# Patient Record
Sex: Male | Born: 1935 | Race: White | Hispanic: No | Smoking: Former smoker
Health system: Southern US, Community
[De-identification: ages and names within clinical notes are randomized; demographics above are authoritative.]

## PROBLEM LIST (undated history)

## (undated) DIAGNOSIS — F0390 Unspecified dementia without behavioral disturbance: Secondary | ICD-10-CM

## (undated) DIAGNOSIS — M1990 Unspecified osteoarthritis, unspecified site: Secondary | ICD-10-CM

## (undated) DIAGNOSIS — I209 Angina pectoris, unspecified: Secondary | ICD-10-CM

## (undated) DIAGNOSIS — M810 Age-related osteoporosis without current pathological fracture: Secondary | ICD-10-CM

## (undated) DIAGNOSIS — K219 Gastro-esophageal reflux disease without esophagitis: Secondary | ICD-10-CM

## (undated) DIAGNOSIS — E7800 Pure hypercholesterolemia, unspecified: Secondary | ICD-10-CM

## (undated) DIAGNOSIS — I2510 Atherosclerotic heart disease of native coronary artery without angina pectoris: Secondary | ICD-10-CM

## (undated) DIAGNOSIS — G629 Polyneuropathy, unspecified: Secondary | ICD-10-CM

## (undated) DIAGNOSIS — F329 Major depressive disorder, single episode, unspecified: Secondary | ICD-10-CM

## (undated) DIAGNOSIS — I10 Essential (primary) hypertension: Secondary | ICD-10-CM

## (undated) DIAGNOSIS — E0590 Thyrotoxicosis, unspecified without thyrotoxic crisis or storm: Secondary | ICD-10-CM

## (undated) DIAGNOSIS — C801 Malignant (primary) neoplasm, unspecified: Secondary | ICD-10-CM

## (undated) DIAGNOSIS — F32A Depression, unspecified: Secondary | ICD-10-CM

## (undated) DIAGNOSIS — I509 Heart failure, unspecified: Secondary | ICD-10-CM

## (undated) HISTORY — PX: BACK SURGERY: SHX140

## (undated) HISTORY — DX: Heart failure, unspecified: I50.9

## (undated) HISTORY — DX: Unspecified osteoarthritis, unspecified site: M19.90

## (undated) HISTORY — DX: Age-related osteoporosis without current pathological fracture: M81.0

## (undated) HISTORY — DX: Gastro-esophageal reflux disease without esophagitis: K21.9

## (undated) HISTORY — DX: Essential (primary) hypertension: I10

## (undated) HISTORY — DX: Unspecified dementia, unspecified severity, without behavioral disturbance, psychotic disturbance, mood disturbance, and anxiety: F03.90

---

## 1998-10-18 ENCOUNTER — Encounter: Payer: Self-pay | Admitting: Emergency Medicine

## 1998-10-18 ENCOUNTER — Emergency Department (HOSPITAL_COMMUNITY): Admission: EM | Admit: 1998-10-18 | Discharge: 1998-10-18 | Payer: Self-pay | Admitting: Emergency Medicine

## 2001-12-18 ENCOUNTER — Emergency Department (HOSPITAL_COMMUNITY): Admission: EM | Admit: 2001-12-18 | Discharge: 2001-12-19 | Payer: Self-pay | Admitting: Emergency Medicine

## 2001-12-18 ENCOUNTER — Encounter: Payer: Self-pay | Admitting: Emergency Medicine

## 2002-01-17 ENCOUNTER — Ambulatory Visit (HOSPITAL_COMMUNITY): Admission: RE | Admit: 2002-01-17 | Discharge: 2002-01-17 | Payer: Self-pay | Admitting: *Deleted

## 2002-05-17 ENCOUNTER — Ambulatory Visit (HOSPITAL_COMMUNITY): Admission: RE | Admit: 2002-05-17 | Discharge: 2002-05-17 | Payer: Self-pay | Admitting: *Deleted

## 2004-08-11 ENCOUNTER — Encounter: Admission: RE | Admit: 2004-08-11 | Discharge: 2004-08-11 | Payer: Self-pay | Admitting: Neurology

## 2004-08-31 ENCOUNTER — Encounter: Admission: RE | Admit: 2004-08-31 | Discharge: 2004-08-31 | Payer: Self-pay | Admitting: Neurology

## 2004-12-01 ENCOUNTER — Encounter: Admission: RE | Admit: 2004-12-01 | Discharge: 2004-12-01 | Payer: Self-pay | Admitting: Internal Medicine

## 2004-12-16 ENCOUNTER — Encounter: Admission: RE | Admit: 2004-12-16 | Discharge: 2004-12-16 | Payer: Self-pay | Admitting: Internal Medicine

## 2007-09-13 HISTORY — PX: CARDIAC CATHETERIZATION: SHX172

## 2007-09-28 ENCOUNTER — Encounter: Admission: RE | Admit: 2007-09-28 | Discharge: 2007-09-28 | Payer: Self-pay | Admitting: Cardiology

## 2007-10-05 ENCOUNTER — Ambulatory Visit (HOSPITAL_COMMUNITY): Admission: RE | Admit: 2007-10-05 | Discharge: 2007-10-05 | Payer: Self-pay | Admitting: Cardiology

## 2008-11-03 ENCOUNTER — Emergency Department (HOSPITAL_COMMUNITY): Admission: EM | Admit: 2008-11-03 | Discharge: 2008-11-03 | Payer: Self-pay | Admitting: Emergency Medicine

## 2009-01-12 HISTORY — PX: LUMBAR LAMINECTOMY/DECOMPRESSION MICRODISCECTOMY: SHX5026

## 2009-02-26 ENCOUNTER — Encounter: Admission: RE | Admit: 2009-02-26 | Discharge: 2009-02-26 | Payer: Self-pay | Admitting: Cardiology

## 2009-03-04 ENCOUNTER — Ambulatory Visit (HOSPITAL_COMMUNITY)
Admission: RE | Admit: 2009-03-04 | Discharge: 2009-03-04 | Payer: Self-pay | Source: Home / Self Care | Admitting: Cardiovascular Disease

## 2009-04-11 ENCOUNTER — Inpatient Hospital Stay (HOSPITAL_COMMUNITY): Admission: RE | Admit: 2009-04-11 | Discharge: 2009-04-15 | Payer: Self-pay | Admitting: Specialist

## 2010-01-17 ENCOUNTER — Emergency Department (HOSPITAL_COMMUNITY)
Admission: EM | Admit: 2010-01-17 | Discharge: 2010-01-18 | Payer: Self-pay | Source: Home / Self Care | Admitting: Emergency Medicine

## 2010-04-02 LAB — BASIC METABOLIC PANEL
BUN: 15 mg/dL (ref 6–23)
BUN: 21 mg/dL (ref 6–23)
BUN: 22 mg/dL (ref 6–23)
CO2: 27 mEq/L (ref 19–32)
Calcium: 8.5 mg/dL (ref 8.4–10.5)
Calcium: 8.6 mg/dL (ref 8.4–10.5)
Chloride: 103 mEq/L (ref 96–112)
Creatinine, Ser: 0.92 mg/dL (ref 0.4–1.5)
Creatinine, Ser: 0.98 mg/dL (ref 0.4–1.5)
GFR calc Af Amer: >60 mL/min (ref >60)
GFR calc Af Amer: >60 mL/min (ref >60)
GFR calc Af Amer: >60 mL/min (ref >60)
GFR calc non Af Amer: >60 mL/min (ref >60)
GFR calc non Af Amer: >60 mL/min (ref >60)
Glucose, Bld: 115 mg/dL — ABNORMAL HIGH (ref 70–99)
Glucose, Bld: 141 mg/dL — ABNORMAL HIGH (ref 70–99)
Potassium: 3.7 mEq/L (ref 3.5–5.1)
Potassium: 3.8 mEq/L (ref 3.5–5.1)
Sodium: 135 mEq/L (ref 135–145)
Sodium: 136 mEq/L (ref 135–145)

## 2010-04-02 LAB — CBC
HCT: 48.9 % (ref 39.0–52.0)
Platelets: 166 10*3/uL (ref 150–400)

## 2010-04-02 LAB — HEMOGLOBIN AND HEMATOCRIT, BLOOD
HCT: 42.7 % (ref 39.0–52.0)
HCT: 43.9 % (ref 39.0–52.0)

## 2010-04-07 LAB — CBC
HCT: 49.9 % (ref 39.0–52.0)
MCV: 97.9 fL (ref 78.0–100.0)
RBC: 5.1 MIL/uL (ref 4.22–5.81)

## 2010-04-07 LAB — PROTIME-INR
INR: 1.12 (ref 0.00–1.49)
Prothrombin Time: 14.3 seconds (ref 11.6–15.2)

## 2010-04-07 LAB — URINALYSIS, ROUTINE W REFLEX MICROSCOPIC
Nitrite: NEGATIVE
Protein, ur: NEGATIVE mg/dL
Specific Gravity, Urine: 1.019 (ref 1.005–1.030)
Urobilinogen, UA: 0.2 mg/dL (ref 0.0–1.0)

## 2010-04-07 LAB — COMPREHENSIVE METABOLIC PANEL
AST: 18 U/L (ref 0–37)
Albumin: 3.7 g/dL (ref 3.5–5.2)
Alkaline Phosphatase: 70 U/L (ref 39–117)
CO2: 30 mEq/L (ref 19–32)
Calcium: 9.6 mg/dL (ref 8.4–10.5)
Potassium: 4.2 mEq/L (ref 3.5–5.1)
Sodium: 143 mEq/L (ref 135–145)
Total Bilirubin: 0.8 mg/dL (ref 0.3–1.2)

## 2010-04-17 LAB — COMPREHENSIVE METABOLIC PANEL
AST: 20 U/L (ref 0–37)
Albumin: 3.4 g/dL — ABNORMAL LOW (ref 3.5–5.2)
Alkaline Phosphatase: 62 U/L (ref 39–117)
BUN: 21 mg/dL (ref 6–23)
GFR calc Af Amer: >60 mL/min (ref >60)
Potassium: 3.5 mEq/L (ref 3.5–5.1)
Sodium: 138 mEq/L (ref 135–145)
Total Protein: 6.6 g/dL (ref 6.0–8.3)

## 2010-04-17 LAB — CBC
HCT: 49.3 % (ref 39.0–52.0)
Platelets: 190 10*3/uL (ref 150–400)
RDW: 13.1 % (ref 11.5–15.5)
WBC: 7 10*3/uL (ref 4.0–10.5)

## 2010-04-17 LAB — DIFFERENTIAL
Basophils Relative: 1 % (ref 0–1)
Eosinophils Relative: 3 % (ref 0–5)
Monocytes Absolute: 0.4 10*3/uL (ref 0.1–1.0)
Monocytes Relative: 6 % (ref 3–12)
Neutro Abs: 5.2 10*3/uL (ref 1.7–7.7)

## 2010-04-17 LAB — URINALYSIS, ROUTINE W REFLEX MICROSCOPIC
Ketones, ur: NEGATIVE mg/dL
Nitrite: NEGATIVE
Protein, ur: NEGATIVE mg/dL
Urobilinogen, UA: 0.2 mg/dL (ref 0.0–1.0)

## 2010-04-17 LAB — APTT: aPTT: 28 seconds (ref 24–37)

## 2010-05-27 NOTE — Cardiovascular Report (Signed)
[name] [name], [name] [name]                  ACCOUNT NO.:  [id number]   MEDICAL RECORD NO.:  [id number]          PATIENT TYPE:  OIB   LOCATION:  2899                         FACILITY:  [facility]   PHYSICIAN:  Bryson Dames, M.D.DATE OF BIRTH:  01-29-1925   DATE OF PROCEDURE:  10/05/2007  DATE OF DISCHARGE:  10/05/2007                            CARDIAC CATHETERIZATION   PROCEDURES PERFORMED:  1. Selective coronary angiography by Judkins technique.  2. Retrograde left heart catheterization.  3. Left ventricular angiography.   COMPLICATIONS:  None.   ENTRY SITE:  Right femoral.   DYE USED:  Omnipaque.   PATIENT PROFILE:  [name] [name] is an 77 year old white gentleman who is a  medical patient of Dr. Vernard Gambles who has hyperlipidemia and hypertension,  occasional shortness of breath, dizziness and lightheadedness.  He is  treated with simvastatin, metoprolol, aspirin, fish oil, and lisinopril.  He recently was seen at our office by Dr. Quay Burow and a stress  test was ordered showing evidence of mild to moderate ischemia in the  mid anterior and apical areas of his left ventricle with an ejection  fraction of 50%.  This was considered a high risk and the patient was  urged to have this cardiac catheterization which was performed today  electively without any complications.   RESULTS:  The left ventricular pressure was 178/78 with a mean of 118.  The left ventricular pressure was 175/6 with an end-diastolic pressure  of 15.  There was no significant aortic valve gradient by pullback  technique.   ANGIOGRAPHIC RESULTS:  The patient's left main coronary artery is  normal.  The left anterior descending coronary artery is diseased from  the ostium to the apex and shows a 95% very proximal LAD stenosis,  another 100% occlusion of the LAD just beyond the first diagonal in the  midportion of the LAD.  There are some more skip lesions along the LAD  that show high-grade stenosis and there is  collateralization of the LAD  in a retrograde fashion from the right coronary artery posterolateral  branch.  There is very little in the way of antegrade flow into the  native LAD.   The left circumflex coronary artery is a small and nondominant vessel  giving rise to a huge trifurcating obtuse marginal branch which shows no  significant lesions.  There are nicks and pins in the proximal portion  of this vessel.   The right coronary artery contains a 50% area of narrowing in the  midportion of this vessel.  The distal RCA bifurcates and appears fairly  normal.  There is one large marginal branch off of the mid RCA.   The left ventricle shows deranged contractility with anterior wall being  severely hypokinetic from base to apex.  The inferior wall contracts  vigorously, ejection fraction estimate is 50% and no mitral  regurgitation is seen.  No percutaneous intervention was performed  today.   FINAL IMPRESSIONS:  1. Severe coronary artery disease of the left anterior descending      coronary artery including a 95% proximal left anterior descending  stenosis and a 100% occlusion of the left anterior descending just      beyond diagonal branch with retrograde collateral flow into the      distal left anterior descending from the right coronary artery.  2. Minimally diseased circumflex and obtuse marginal.  3. A 50% mid right coronary artery stenosis.  4. Left ventricular ejection fraction 50% with severe anterior and      lateral wall hypokinesis.   PLAN:  Medical therapy of this patient and followup in our office.           ______________________________  Bryson Dames, M.D.     WHG/MEDQ  D:  10/05/2007  T:  10/06/2007  Job:  [id number]   cc:   Dr. Vernard Gambles

## 2010-07-21 ENCOUNTER — Ambulatory Visit: Payer: Medicare Other | Attending: Clinical Neurophysiology | Admitting: Physical Therapy

## 2010-07-21 DIAGNOSIS — M25549 Pain in joints of unspecified hand: Secondary | ICD-10-CM | POA: Insufficient documentation

## 2010-07-21 DIAGNOSIS — IMO0001 Reserved for inherently not codable concepts without codable children: Secondary | ICD-10-CM | POA: Insufficient documentation

## 2010-07-21 DIAGNOSIS — R269 Unspecified abnormalities of gait and mobility: Secondary | ICD-10-CM | POA: Insufficient documentation

## 2010-07-21 DIAGNOSIS — R262 Difficulty in walking, not elsewhere classified: Secondary | ICD-10-CM | POA: Insufficient documentation

## 2010-07-22 ENCOUNTER — Ambulatory Visit: Payer: Self-pay | Admitting: Physical Therapy

## 2010-07-28 ENCOUNTER — Ambulatory Visit: Payer: Medicare Other | Admitting: Physical Therapy

## 2010-07-30 ENCOUNTER — Ambulatory Visit: Payer: Medicare Other | Admitting: Physical Therapy

## 2010-08-04 ENCOUNTER — Ambulatory Visit: Payer: Medicare Other | Admitting: Physical Therapy

## 2010-08-06 ENCOUNTER — Ambulatory Visit: Payer: Medicare Other | Admitting: Physical Therapy

## 2010-08-11 ENCOUNTER — Ambulatory Visit: Payer: Medicare Other | Admitting: Physical Therapy

## 2010-08-13 ENCOUNTER — Ambulatory Visit: Payer: Medicare Other | Attending: Clinical Neurophysiology | Admitting: Physical Therapy

## 2010-08-13 DIAGNOSIS — M25549 Pain in joints of unspecified hand: Secondary | ICD-10-CM | POA: Insufficient documentation

## 2010-08-13 DIAGNOSIS — IMO0001 Reserved for inherently not codable concepts without codable children: Secondary | ICD-10-CM | POA: Insufficient documentation

## 2010-08-13 DIAGNOSIS — R262 Difficulty in walking, not elsewhere classified: Secondary | ICD-10-CM | POA: Insufficient documentation

## 2010-08-13 DIAGNOSIS — R269 Unspecified abnormalities of gait and mobility: Secondary | ICD-10-CM | POA: Insufficient documentation

## 2010-08-18 ENCOUNTER — Ambulatory Visit: Payer: Medicare Other | Admitting: Physical Therapy

## 2010-08-20 ENCOUNTER — Ambulatory Visit: Payer: Medicare Other | Admitting: Physical Therapy

## 2012-04-03 ENCOUNTER — Ambulatory Visit: Payer: Medicare Other

## 2012-04-03 ENCOUNTER — Ambulatory Visit (INDEPENDENT_AMBULATORY_CARE_PROVIDER_SITE_OTHER): Payer: Medicare Other | Admitting: Family Medicine

## 2012-04-03 VITALS — BP 120/82 | HR 60 | Temp 98.0°F | Resp 16 | Ht 69.0 in | Wt 192.0 lb

## 2012-04-03 DIAGNOSIS — M25471 Effusion, right ankle: Secondary | ICD-10-CM

## 2012-04-03 DIAGNOSIS — M25579 Pain in unspecified ankle and joints of unspecified foot: Secondary | ICD-10-CM

## 2012-04-03 DIAGNOSIS — S9000XA Contusion of unspecified ankle, initial encounter: Secondary | ICD-10-CM | POA: Diagnosis not present

## 2012-04-03 DIAGNOSIS — M25476 Effusion, unspecified foot: Secondary | ICD-10-CM

## 2012-04-03 DIAGNOSIS — M25571 Pain in right ankle and joints of right foot: Secondary | ICD-10-CM

## 2012-04-03 DIAGNOSIS — M25473 Effusion, unspecified ankle: Secondary | ICD-10-CM | POA: Diagnosis not present

## 2012-04-03 DIAGNOSIS — S9001XA Contusion of right ankle, initial encounter: Secondary | ICD-10-CM

## 2012-04-03 NOTE — Patient Instructions (Addendum)
Ice over a towel on and off for 15 minutes next few days, elevate when seated and work on range of motion out of the brace. Recheck in next 7 to 10 days - sooner if worse. Use walker as need for stability, and if hurts to put weight on this ankle - minimize walking.  Return to the clinic or go to the nearest emergency room if any of your symptoms worsen or new symptoms occur.   Ankle Sprain An ankle sprain is an injury to the strong, fibrous tissues (ligaments) that hold the bones of your ankle joint together.  CAUSES An ankle sprain is usually caused by a fall or by twisting your ankle. Ankle sprains most commonly occur when you step on the outer edge of your foot, and your ankle turns inward. People who participate in sports are more prone to these types of injuries.  SYMPTOMS   Pain in your ankle. The pain may be present at rest or only when you are trying to stand or walk.  Swelling.  Bruising. Bruising may develop immediately or within 1 to 2 days after your injury.  Difficulty standing or walking, particularly when turning corners or changing directions. DIAGNOSIS  Your caregiver will ask you details about your injury and perform a physical exam of your ankle to determine if you have an ankle sprain. During the physical exam, your caregiver will press on and apply pressure to specific areas of your foot and ankle. Your caregiver will try to move your ankle in certain ways. An X-ray exam may be done to be sure a bone was not broken or a ligament did not separate from one of the bones in your ankle (avulsion fracture).  TREATMENT  Certain types of braces can help stabilize your ankle. Your caregiver can make a recommendation for this. Your caregiver may recommend the use of medicine for pain. If your sprain is severe, your caregiver may refer you to a surgeon who helps to restore function to parts of your skeletal system (orthopedist) or a physical therapist. [place]  ice to your injury for 1 to 2 days or as directed by your caregiver. Applying ice helps to reduce inflammation and pain.  Put ice in a plastic bag.  Place a towel between your skin and the bag.  Leave the ice on for 15 to 20 minutes at a time, every 2 hours while you are awake.  Only take over-the-counter or prescription medicines for pain, discomfort, or fever as directed by your caregiver.  Keep your injured leg elevated, when possible, to lessen swelling.  If your caregiver recommends crutches, use them as instructed. Gradually put weight on the affected ankle. Continue to use crutches or a cane until you can walk without feeling pain in your ankle.  If you have a plaster splint, wear the splint as directed by your caregiver. Do not rest it on anything harder than a pillow for the first 24 hours. Do not put weight on it. Do not get it wet. You may take it off to take a shower or bath.  You may have been given an elastic bandage to wear around your ankle to provide support. If the elastic bandage is too tight (you have numbness or tingling in your foot or your foot becomes cold and blue), adjust the bandage to make it comfortable.  If you have an air splint, you may blow more air into it or let air out to make it  more comfortable. You may take your splint off at night and before taking a shower or bath.  Wiggle your toes in the splint several times per day to decrease swelling. SEEK MEDICAL CARE IF:   You have an increase in bruising, swelling, or pain.  Your toes feel extremely cold or you lose feeling in your foot.  Your pain is not relieved with medicine. SEEK IMMEDIATE MEDICAL CARE IF:  Your toes are numb or blue.  You have severe pain. MAKE SURE YOU:   Understand these instructions.  Will watch your condition.  Will get help right away if you are not doing well or get worse. Document Released: 12/29/2004 Document Revised: 03/23/2011 Document Reviewed:  01/10/2011 [facility] Patient Information 2013 [place].

## 2012-04-03 NOTE — Progress Notes (Signed)
Subjective:    Patient ID: [name] [name], male    DOB: 1925/09/10, 79 y.o.   MRN: [id number]  HPI  [name] [name] is a 79 y.o. male  Walking downstairs yesterday at 4pm, lost footing and fell twisting R ankle. No other injuries, denies other areas of injury, specifically no hip or knee pain.  Able to walk on it, but uses rolling walker - wheelchair in house d/t neuropathy.   Tx: alleve.  No ice.   Hx of neuropathy, and trouble with balance in past.    Review of Systems No hip or knee pain.  Chronic neuropathy and balance issues as above.      Objective:   Physical Exam  Vitals reviewed. Constitutional: He is oriented to person, place, and time. He appears well-developed.  HENT:  Head: Normocephalic and atraumatic.  Pulmonary/Chest: Effort normal.  Musculoskeletal:       Right hip: He exhibits normal range of motion and no tenderness.       Left hip: He exhibits normal range of motion and no tenderness.       Right knee: He exhibits normal range of motion. No tenderness found.       Left knee: He exhibits normal range of motion. No tenderness found.       Right ankle: He exhibits decreased range of motion (but able to flex/extend/inversion/eversion) and swelling. Tenderness. Lateral malleolus and medial malleolus tenderness found. Achilles tendon normal. Achilles tendon exhibits no pain and no defect.       Right foot: He exhibits swelling (with dependent echymosis medially and laterally below malleoli. ). He exhibits no bony tenderness (no navicular or metatarsal tenderness. ) and normal capillary refill.  Neurological: He is alert and oriented to person, place, and time.  Skin: Skin is warm and dry.   [facility] reading (PRIMARY) by  Dr. Carlota Raspberry: R ankle: ? Irregular appearance of medial body of talus without discrete fx on other views.  To rad for overread.   RADIOLOGY REPORT*  Clinical Data: Injured right ankle.  RIGHT ANKLE - COMPLETE 3+ VIEW  Comparison: None  Findings: The ankle  mortise is maintained. No osteochondral  abnormality. The mid and hind foot bony structures are intact.  Small calcaneal spurs are noted.  IMPRESSION:  No acute bony findings.       Assessment & Plan:  [name] [name] is a 79 y.o. male  Pain in joint, ankle and foot, right - Plan: DG Ankle Complete Right  Ankle swelling, right  Superficial bruising of ankle, right, initial encounter   R ankle pain/sprain - lateral likely, but underlying neuropathy.  No fx identified and has been weight bearing.  sweedo brace, elevate, ice, tylenol or short course of Alleve prn (watch BP).  Recheck wih me or primary provider in next 1 week to 10 days. rtc precautions.  Patient Instructions  Ice over a towel on and off for 15 minutes next few days, elevate when seated and work on range of motion out of the brace. Recheck in next 7 to 10 days - sooner if worse. Use walker as need for stability, and if hurts to put weight on this ankle - minimize walking.  Return to the clinic or go to the nearest emergency room if any of your symptoms worsen or new symptoms occur.   Ankle Sprain An ankle sprain is an injury to the strong, fibrous tissues (ligaments) that hold the bones of your ankle joint together.  CAUSES An ankle sprain is usually caused  by a fall or by twisting your ankle. Ankle sprains most commonly occur when you step on the outer edge of your foot, and your ankle turns inward. People who participate in sports are more prone to these types of injuries.  SYMPTOMS   Pain in your ankle. The pain may be present at rest or only when you are trying to stand or walk.  Swelling.  Bruising. Bruising may develop immediately or within 1 to 2 days after your injury.  Difficulty standing or walking, particularly when turning corners or changing directions. DIAGNOSIS  Your caregiver will ask you details about your injury and perform a physical exam of your ankle to determine if you have an ankle sprain. During  the physical exam, your caregiver will press on and apply pressure to specific areas of your foot and ankle. Your caregiver will try to move your ankle in certain ways. An X-ray exam may be done to be sure a bone was not broken or a ligament did not separate from one of the bones in your ankle (avulsion fracture).  TREATMENT  Certain types of braces can help stabilize your ankle. Your caregiver can make a recommendation for this. Your caregiver may recommend the use of medicine for pain. If your sprain is severe, your caregiver may refer you to a surgeon who helps to restore function to parts of your skeletal system (orthopedist) or a physical therapist. [place] ice to your injury for 1 to 2 days or as directed by your caregiver. Applying ice helps to reduce inflammation and pain.  Put ice in a plastic bag.  Place a towel between your skin and the bag.  Leave the ice on for 15 to 20 minutes at a time, every 2 hours while you are awake.  Only take over-the-counter or prescription medicines for pain, discomfort, or fever as directed by your caregiver.  Keep your injured leg elevated, when possible, to lessen swelling.  If your caregiver recommends crutches, use them as instructed. Gradually put weight on the affected ankle. Continue to use crutches or a cane until you can walk without feeling pain in your ankle.  If you have a plaster splint, wear the splint as directed by your caregiver. Do not rest it on anything harder than a pillow for the first 24 hours. Do not put weight on it. Do not get it wet. You may take it off to take a shower or bath.  You may have been given an elastic bandage to wear around your ankle to provide support. If the elastic bandage is too tight (you have numbness or tingling in your foot or your foot becomes cold and blue), adjust the bandage to make it comfortable.  If you have an air splint, you may blow more air into it or let air out to make  it more comfortable. You may take your splint off at night and before taking a shower or bath.  Wiggle your toes in the splint several times per day to decrease swelling. SEEK MEDICAL CARE IF:   You have an increase in bruising, swelling, or pain.  Your toes feel extremely cold or you lose feeling in your foot.  Your pain is not relieved with medicine. SEEK IMMEDIATE MEDICAL CARE IF:  Your toes are numb or blue.  You have severe pain. MAKE SURE YOU:   Understand these instructions.  Will watch your condition.  Will get help right away if you are not doing well  or get worse. Document Released: 12/29/2004 Document Revised: 03/23/2011 Document Reviewed: 01/10/2011 [facility] Patient Information 2013 [place].

## 2012-04-13 DIAGNOSIS — S93409A Sprain of unspecified ligament of unspecified ankle, initial encounter: Secondary | ICD-10-CM | POA: Diagnosis not present

## 2012-04-15 DIAGNOSIS — IMO0001 Reserved for inherently not codable concepts without codable children: Secondary | ICD-10-CM | POA: Diagnosis not present

## 2012-04-15 DIAGNOSIS — I10 Essential (primary) hypertension: Secondary | ICD-10-CM | POA: Diagnosis not present

## 2012-04-15 DIAGNOSIS — S93409A Sprain of unspecified ligament of unspecified ankle, initial encounter: Secondary | ICD-10-CM | POA: Diagnosis not present

## 2012-04-15 DIAGNOSIS — R262 Difficulty in walking, not elsewhere classified: Secondary | ICD-10-CM | POA: Diagnosis not present

## 2013-03-31 ENCOUNTER — Ambulatory Visit (HOSPITAL_COMMUNITY): Payer: Medicare Other

## 2013-03-31 ENCOUNTER — Encounter (HOSPITAL_COMMUNITY): Payer: Self-pay | Admitting: Emergency Medicine

## 2013-03-31 ENCOUNTER — Emergency Department (HOSPITAL_COMMUNITY)
Admission: EM | Admit: 2013-03-31 | Discharge: 2013-03-31 | Disposition: A | Discharge status: Home / Self Care | Payer: Medicare Other | Attending: Emergency Medicine | Admitting: Emergency Medicine

## 2013-03-31 DIAGNOSIS — Y939 Activity, unspecified: Secondary | ICD-10-CM | POA: Insufficient documentation

## 2013-03-31 DIAGNOSIS — Y92009 Unspecified place in unspecified non-institutional (private) residence as the place of occurrence of the external cause: Secondary | ICD-10-CM | POA: Insufficient documentation

## 2013-03-31 DIAGNOSIS — I10 Essential (primary) hypertension: Secondary | ICD-10-CM | POA: Insufficient documentation

## 2013-03-31 DIAGNOSIS — M810 Age-related osteoporosis without current pathological fracture: Secondary | ICD-10-CM | POA: Diagnosis not present

## 2013-03-31 DIAGNOSIS — W19XXXA Unspecified fall, initial encounter: Secondary | ICD-10-CM

## 2013-03-31 DIAGNOSIS — M129 Arthropathy, unspecified: Secondary | ICD-10-CM | POA: Insufficient documentation

## 2013-03-31 DIAGNOSIS — R040 Epistaxis: Secondary | ICD-10-CM | POA: Diagnosis not present

## 2013-03-31 DIAGNOSIS — Z79899 Other long term (current) drug therapy: Secondary | ICD-10-CM | POA: Insufficient documentation

## 2013-03-31 DIAGNOSIS — S0120XA Unspecified open wound of nose, initial encounter: Secondary | ICD-10-CM | POA: Diagnosis not present

## 2013-03-31 DIAGNOSIS — S022XXA Fracture of nasal bones, initial encounter for closed fracture: Secondary | ICD-10-CM | POA: Diagnosis not present

## 2013-03-31 DIAGNOSIS — Z87891 Personal history of nicotine dependence: Secondary | ICD-10-CM | POA: Insufficient documentation

## 2013-03-31 DIAGNOSIS — S0121XA Laceration without foreign body of nose, initial encounter: Secondary | ICD-10-CM

## 2013-03-31 DIAGNOSIS — W010XXA Fall on same level from slipping, tripping and stumbling without subsequent striking against object, initial encounter: Secondary | ICD-10-CM | POA: Insufficient documentation

## 2013-03-31 DIAGNOSIS — W1809XA Striking against other object with subsequent fall, initial encounter: Secondary | ICD-10-CM | POA: Insufficient documentation

## 2013-03-31 DIAGNOSIS — Z7982 Long term (current) use of aspirin: Secondary | ICD-10-CM | POA: Diagnosis not present

## 2013-03-31 LAB — CBC WITH DIFFERENTIAL/PLATELET
BASOS ABS: 0 10*3/uL (ref 0.0–0.1)
BASOS PCT: 0 % (ref 0–1)
EOS ABS: 0.3 10*3/uL (ref 0.0–0.7)
EOS PCT: 3 % (ref 0–5)
HCT: 47.2 % (ref 39.0–52.0)
Hemoglobin: 16.2 g/dL (ref 13.0–17.0)
LYMPHS ABS: 1.5 10*3/uL (ref 0.7–4.0)
Lymphocytes Relative: 21 % (ref 12–46)
MCH: 32 pg (ref 26.0–34.0)
MCHC: 34.3 g/dL (ref 30.0–36.0)
MCV: 93.3 fL (ref 78.0–100.0)
Monocytes Absolute: 0.5 10*3/uL (ref 0.1–1.0)
Monocytes Relative: 7 % (ref 3–12)
NEUTROS PCT: 69 % (ref 43–77)
Neutro Abs: 5.2 10*3/uL (ref 1.7–7.7)
PLATELETS: 194 10*3/uL (ref 150–400)
RBC: 5.06 MIL/uL (ref 4.22–5.81)
RDW: 12.8 % (ref 11.5–15.5)
WBC: 7.5 10*3/uL (ref 4.0–10.5)

## 2013-03-31 LAB — URINALYSIS, ROUTINE W REFLEX MICROSCOPIC
Bilirubin Urine: NEGATIVE
Glucose, UA: NEGATIVE mg/dL
HGB URINE DIPSTICK: NEGATIVE
Ketones, ur: NEGATIVE mg/dL
Leukocytes, UA: NEGATIVE
NITRITE: NEGATIVE
PROTEIN: NEGATIVE mg/dL
SPECIFIC GRAVITY, URINE: 1.02 (ref 1.005–1.030)
UROBILINOGEN UA: 0.2 mg/dL (ref 0.0–1.0)
pH: 6.5 (ref 5.0–8.0)

## 2013-03-31 LAB — BASIC METABOLIC PANEL
BUN: 28 mg/dL — ABNORMAL HIGH (ref 6–23)
CALCIUM: 9.6 mg/dL (ref 8.4–10.5)
CO2: 26 mEq/L (ref 19–32)
Chloride: 101 mEq/L (ref 96–112)
Creatinine, Ser: 1.04 mg/dL (ref 0.50–1.35)
GFR, EST AFRICAN AMERICAN: 73 mL/min — AB (ref >90)
GFR, EST NON AFRICAN AMERICAN: 63 mL/min — AB (ref >90)
Glucose, Bld: 135 mg/dL — ABNORMAL HIGH (ref 70–99)
POTASSIUM: 4.2 meq/L (ref 3.7–5.3)
SODIUM: 140 meq/L (ref 137–147)

## 2013-03-31 MED ORDER — LIDOCAINE-EPINEPHRINE 2 %-1:100000 IJ SOLN
20.0000 mL | Freq: Once | INTRAMUSCULAR | Status: AC
  Administered 2013-03-31: 20 mL via INTRADERMAL
  Filled 2013-03-31: qty 1

## 2013-03-31 MED ORDER — LIDOCAINE-EPINEPHRINE 2 %-1:100000 IJ SOLN
INTRAMUSCULAR | Status: AC
  Filled 2013-03-31: qty 1

## 2013-03-31 NOTE — ED Provider Notes (Signed)
CSN: [id number]     Arrival date & time 03/31/13  1502 History   First MD Initiated Contact with Patient 03/31/13 1513     Chief Complaint  Patient presents with  . Fall  . Nose Bridge Laceraton      (Consider location/radiation/quality/duration/timing/severity/associated sxs/prior Treatment) Patient is a 80 y.o. male presenting with fall. The history is provided by the patient.  Fall   He was at home, in his basement, when he tripped on a step, falling and striking his nose. He does not believe he lost consciousness. He was alone. He called the ambulance for transport here. Nursing has documented that he lost 200 cc of blood. Apparently this is what EMS suggested. He denies recent illnesses, including nausea, vomiting, diarrhea, fever, or cough, chest pain, weakness, or dizziness. There are no other known modifying factors.  Past Medical History  Diagnosis Date  . Arthritis   . Hypertension   . Osteoporosis    Past Surgical History  Procedure Laterality Date  . Spine surgery     No family history on file. History  Substance Use Topics  . Smoking status: Former Research scientist (life sciences)  . Smokeless tobacco: Not on file  . Alcohol Use: Not on file    Review of Systems  All other systems reviewed and are negative.      Allergies  Cortisone  Home Medications   Current Outpatient Rx  Name  Route  Sig  Dispense  Refill  . amLODipine (NORVASC) 10 MG tablet   Oral   Take 5 mg by mouth at bedtime.         [name] aspirin 325 MG tablet   Oral   Take 325 mg by mouth daily.         . calcium-vitamin D (OSCAL WITH D) 500-200 MG-UNIT per tablet   Oral   Take 2 tablets by mouth daily with breakfast.         . cyanocobalamin (,VITAMIN B-12,) 1000 MCG/ML injection   Intramuscular   Inject 1,000 mcg into the muscle once.         . Doxylamine Succinate, Sleep, (SLEEP AID PO)   Oral   Take 0.5 tablets by mouth at bedtime as needed (sleep).         . fish oil-omega-3 fatty acids  1000 MG capsule   Oral   Take 2 g by mouth daily.         [name] lisinopril-hydrochlorothiazide (PRINZIDE,ZESTORETIC) 20-12.5 MG per tablet   Oral   Take 1 tablet by mouth 2 (two) times daily.         . metoprolol (LOPRESSOR) 100 MG tablet   Oral   Take 100 mg by mouth 2 (two) times daily.         . naproxen sodium (ANAPROX) 220 MG tablet   Oral   Take 220 mg by mouth 2 (two) times daily as needed (pain).         . pravastatin (PRAVACHOL) 80 MG tablet   Oral   Take 80 mg by mouth every evening.           BP 167/80  Pulse 65  Temp(Src) 98 F (36.7 C) (Oral)  Resp 16  SpO2 96% Physical Exam  Nursing note and vitals reviewed. Constitutional: He is oriented to person, place, and time. He appears well-developed.  Elderly, frail  HENT:  Head: Normocephalic.  Right Ear: External ear normal.  Left Ear: External ear normal.  Tender and swollen upper Bridge of  nose with ecchymosis, but no crepitation or significant deformity. There is a somewhat gaping stellate laceration that is bleeding profusely, but not arterially.   Eyes: Conjunctivae and EOM are normal. Pupils are equal, round, and reactive to light.  Neck: Normal range of motion and phonation normal. Neck supple.  Cardiovascular: Normal rate, regular rhythm, normal heart sounds and intact distal pulses.   Pulmonary/Chest: Effort normal and breath sounds normal. He has no wheezes. He exhibits no tenderness and no bony tenderness.  No crepitation, deformity or ecchymosis of the chest wall  Abdominal: Soft. Normal appearance. He exhibits no distension. There is no tenderness. There is no guarding.  Musculoskeletal: Normal range of motion.  Normal range of motion of arms and legs without deformity.  Neurological: He is alert and oriented to person, place, and time. No cranial nerve deficit or sensory deficit. He exhibits normal muscle tone. Coordination normal.  No dysarthria, aphasia or nystagmus  Skin: Skin is warm, dry  and intact.  Psychiatric: He has a normal mood and affect. His behavior is normal. Judgment and thought content normal.    ED Course  Procedures (including critical care time)  Medications  lidocaine-EPINEPHrine (XYLOCAINE W/EPI) 2 %-1:100000 (with pres) injection (not administered)  lidocaine-EPINEPHrine (XYLOCAINE W/EPI) 2 %-1:100000 (with pres) injection 20 mL (20 mLs Intradermal Given by Other 03/31/13 1550)    Patient Vitals for the past 24 hrs:  BP Temp Temp src Pulse Resp SpO2  03/31/13 1629 167/80 mmHg - - 65 16 96 %  03/31/13 1505 156/81 mmHg 98 F (36.7 C) Oral 70 16 98 %   LACERATION REPAIR Performed by: Richarda Blade Consent: Verbal consent obtained. Risks and benefits: risks, benefits and alternatives were discussed Patient identity confirmed: provided demographic data Time out performed prior to procedure Prepped and Draped in normal sterile fashion Wound explored Laceration Location: nose, bridge Laceration Length: 2.5 cmcm No Foreign Bodies seen or palpated Anesthesia: local infiltration Local anesthetic: lidocaine 2% with epinephrine Anesthetic total: 3 ml Irrigation method: syringe Amount of cleaning: standard Skin closure: 4-0 Prolene Number of sutures or staples: 3 Technique: simple Patient tolerance: Patient tolerated the procedure well with no immediate complications.   8:15 PM Reevaluation with update and discussion. After initial assessment and treatment, an updated evaluation reveals alert, calm, cooperative. He is unable to tolerate food and fluid orally. Family members are here. They, state that he is not supposed to be walking downstairs or into the basement. He is dependent on a walker or wheelchair, for mobilization. They feel that he is at his baseline, currently. Findings discussed with family members, all questions answered [place] - Abnormal; Notable for the following:     Glucose, Bld 135 (*)    BUN 28 (*)    GFR calc non Af Amer 63 (*)    GFR calc Af Amer 73 (*)    All other components within normal limits  URINE CULTURE  CBC WITH DIFFERENTIAL  URINALYSIS, ROUTINE W REFLEX MICROSCOPIC   Imaging Review Ct Head Wo Contrast  03/31/2013   CLINICAL DATA:  Fall. Laceration at the bridge of the nose. Bleeding, soft tissue swelling.  EXAM: CT HEAD WITHOUT CONTRAST  TECHNIQUE: Contiguous axial images were obtained from the base of the skull through the vertex without intravenous contrast.  COMPARISON:  MR HEAD W/O CM dated 11/03/2008; CT HEAD W/O CM dated 11/03/2008  FINDINGS: Fractures are noted involving the right  nasal bones with minimal depression. Slight rightward nasal septal deviation, likely on a chronic basis. Paranasal sinuses and mastoids are clear.  There is atrophy and chronic small vessel disease changes. No acute intracranial abnormality. Specifically, no hemorrhage, hydrocephalus, mass lesion, acute infarction, or significant intracranial injury. No acute calvarial abnormality.  IMPRESSION: No acute intracranial abnormality.  Atrophy, chronic microvascular disease.  Right nasal bone fractures.   Electronically Signed   By: Rolm Baptise M.D.   On: 03/31/2013 16:24     EKG Interpretation None      MDM   Final diagnoses:  Fall  Laceration of nose  Nasal bone fracture    Fall without clear cause, but likely related to chronic disability. No serious injuries were encountered. He's improved after treatment. There is no evidence for significant acute anemia from blood loss.  Nursing Notes Reviewed/ Care Coordinated Applicable Imaging Reviewed Interpretation of Laboratory Data incorporated into ED treatment  The patient appears reasonably screened and/or stabilized for discharge and I doubt any other medical condition or other [facility] requiring further screening, evaluation, or treatment in the ED at this time prior to discharge.  Plan: Home  Medications- usual; Home Treatments- rest, wound care; return here if the recommended treatment, does not improve the symptoms; Recommended follow up- PCP 7 days    Richarda Blade, MD 03/31/13 2023

## 2013-03-31 NOTE — Discharge Instructions (Signed)
Facial Fracture A facial fracture is a break in one of the bones of your face. HOME CARE INSTRUCTIONS   Protect the injured part of your face until it is healed.  Do not participate in activities which give chance for re-injury until your doctor approves.  Gently wash and dry your face.  Wear head and facial protection while riding a bicycle, motorcycle, or snowmobile. SEEK MEDICAL CARE IF:   An oral temperature above 102 F (38.9 C) develops.  You have severe headaches or notice changes in your vision.  You have new numbness or tingling in your face.  You develop nausea (feeling sick to your stomach), vomiting or a stiff neck. SEEK IMMEDIATE MEDICAL CARE IF:   You develop difficulty seeing or experience double vision.  You become dizzy, lightheaded, or faint.  You develop trouble speaking, breathing, or swallowing.  You have a watery discharge from your nose or ear. MAKE SURE YOU:   Understand these instructions.  Will watch your condition.  Will get help right away if you are not doing well or get worse. Document Released: 12/29/2004 Document Revised: 03/23/2011 Document Reviewed: 08/18/2007 [facility] Patient Information 2014 [place].  Facial Laceration  A facial laceration is a cut on the face. These injuries can be painful and cause bleeding. Lacerations usually heal quickly, but they need special care to reduce scarring. DIAGNOSIS  Your health care provider will take a medical history, ask for details about how the injury occurred, and examine the wound to determine how deep the cut is. TREATMENT  Some facial lacerations may not require closure. Others may not be able to be closed because of an increased risk of infection. The risk of infection and the chance for successful closure will depend on various factors, including the amount of time since the injury occurred. The wound may be cleaned to help prevent infection. If closure is appropriate, pain  medicines may be given if needed. Your health care provider will use stitches (sutures), wound glue (adhesive), or skin adhesive strips to repair the laceration. These tools bring the skin edges together to allow for faster healing and a better cosmetic outcome. If needed, you may also be given a tetanus shot. HOME CARE INSTRUCTIONS  Only take over-the-counter or prescription medicines as directed by your health care provider.  Follow your health care provider's instructions for wound care. These instructions will vary depending on the technique used for closing the wound. For Sutures:  Keep the wound clean and dry.   If you were given a bandage (dressing), you should change it at least once a day. Also change the dressing if it becomes wet or dirty, or as directed by your health care provider.   Wash the wound with soap and water 2 times a day. Rinse the wound off with water to remove all soap. Pat the wound dry with a clean towel.   After cleaning, apply a thin layer of the antibiotic ointment recommended by your health care provider. This will help prevent infection and keep the dressing from sticking.   You may shower as usual after the first 24 hours. Do not soak the wound in water until the sutures are removed.   Get your sutures removed as directed by your health care provider. With facial lacerations, sutures should usually be taken out after 4 5 days to avoid stitch marks.   Wait a few days after your sutures are removed before applying any makeup. For Skin Adhesive Strips:  Keep the  wound clean and dry.   Do not get the skin adhesive strips wet. You may bathe carefully, using caution to keep the wound dry.   If the wound gets wet, pat it dry with a clean towel.   Skin adhesive strips will fall off on their own. You may trim the strips as the wound heals. Do not remove skin adhesive strips that are still stuck to the wound. They will fall off in time.  For Wound  Adhesive:  You may briefly wet your wound in the shower or bath. Do not soak or scrub the wound. Do not swim. Avoid periods of heavy sweating until the skin adhesive has fallen off on its own. After showering or bathing, gently pat the wound dry with a clean towel.   Do not apply liquid medicine, cream medicine, ointment medicine, or makeup to your wound while the skin adhesive is in place. This may loosen the film before your wound is healed.   If a dressing is placed over the wound, be careful not to apply tape directly over the skin adhesive. This may cause the adhesive to be pulled off before the wound is healed.   Avoid prolonged exposure to sunlight or tanning lamps while the skin adhesive is in place.  The skin adhesive will usually remain in place for 5 10 days, then naturally fall off the skin. Do not pick at the adhesive film.  After Healing: Once the wound has healed, cover the wound with sunscreen during the day for 1 full year. This can help minimize scarring. Exposure to ultraviolet light in the first year will darken the scar. It can take 1 2 years for the scar to lose its redness and to heal completely.  SEEK IMMEDIATE MEDICAL CARE IF:  You have redness, pain, or swelling around the wound.   You see ayellowish-white fluid (pus) coming from the wound.   You have chills or a fever.  MAKE SURE YOU:  Understand these instructions.  Will watch your condition.  Will get help right away if you are not doing well or get worse. Document Released: 02/06/2004 Document Revised: 10/19/2012 Document Reviewed: 08/11/2012 [facility] Patient Information 2014 [place], [place].

## 2013-03-31 NOTE — ED Notes (Signed)
Bed: WA06 Expected date:  Expected time:  Means of arrival:  Comments: EMS-fall

## 2013-03-31 NOTE — ED Notes (Signed)
Patient still unable to urinate.  Sipping on water and 543ml NS bolus started.

## 2013-03-31 NOTE — ED Notes (Signed)
Pt fell due to loss of balance. No LOC. Nose bridge laceration approximately half inch, approximately 200cc of blood loss, bleeding still not controlled. Pressure applied.

## 2013-04-02 LAB — URINE CULTURE
COLONY COUNT: NO GROWTH
Culture: NO GROWTH

## 2013-04-05 ENCOUNTER — Encounter (HOSPITAL_COMMUNITY): Payer: Self-pay | Admitting: Emergency Medicine

## 2013-04-05 ENCOUNTER — Inpatient Hospital Stay (HOSPITAL_COMMUNITY)
Admission: EM | Admit: 2013-04-05 | Discharge: 2013-04-10 | DRG: 392 | Disposition: A | Discharge status: Skilled Nursing Facility | Payer: Medicare Other | Attending: Internal Medicine | Admitting: Internal Medicine

## 2013-04-05 ENCOUNTER — Observation Stay (HOSPITAL_COMMUNITY): Payer: Medicare Other

## 2013-04-05 DIAGNOSIS — G629 Polyneuropathy, unspecified: Secondary | ICD-10-CM | POA: Diagnosis present

## 2013-04-05 DIAGNOSIS — G609 Hereditary and idiopathic neuropathy, unspecified: Secondary | ICD-10-CM | POA: Diagnosis present

## 2013-04-05 DIAGNOSIS — A080 Rotaviral enteritis: Principal | ICD-10-CM | POA: Diagnosis present

## 2013-04-05 DIAGNOSIS — R5383 Other fatigue: Secondary | ICD-10-CM | POA: Diagnosis not present

## 2013-04-05 DIAGNOSIS — M129 Arthropathy, unspecified: Secondary | ICD-10-CM | POA: Diagnosis present

## 2013-04-05 DIAGNOSIS — I10 Essential (primary) hypertension: Secondary | ICD-10-CM | POA: Diagnosis not present

## 2013-04-05 DIAGNOSIS — R197 Diarrhea, unspecified: Secondary | ICD-10-CM | POA: Diagnosis not present

## 2013-04-05 DIAGNOSIS — R112 Nausea with vomiting, unspecified: Secondary | ICD-10-CM

## 2013-04-05 DIAGNOSIS — N320 Bladder-neck obstruction: Secondary | ICD-10-CM | POA: Diagnosis not present

## 2013-04-05 DIAGNOSIS — R339 Retention of urine, unspecified: Secondary | ICD-10-CM | POA: Diagnosis present

## 2013-04-05 DIAGNOSIS — R1314 Dysphagia, pharyngoesophageal phase: Secondary | ICD-10-CM | POA: Diagnosis present

## 2013-04-05 DIAGNOSIS — R5381 Other malaise: Secondary | ICD-10-CM | POA: Diagnosis not present

## 2013-04-05 DIAGNOSIS — K5289 Other specified noninfective gastroenteritis and colitis: Secondary | ICD-10-CM

## 2013-04-05 DIAGNOSIS — R262 Difficulty in walking, not elsewhere classified: Secondary | ICD-10-CM | POA: Diagnosis not present

## 2013-04-05 DIAGNOSIS — E785 Hyperlipidemia, unspecified: Secondary | ICD-10-CM | POA: Diagnosis not present

## 2013-04-05 DIAGNOSIS — M818 Other osteoporosis without current pathological fracture: Secondary | ICD-10-CM | POA: Diagnosis not present

## 2013-04-05 DIAGNOSIS — S022XXA Fracture of nasal bones, initial encounter for closed fracture: Secondary | ICD-10-CM | POA: Diagnosis present

## 2013-04-05 DIAGNOSIS — R319 Hematuria, unspecified: Secondary | ICD-10-CM | POA: Diagnosis not present

## 2013-04-05 DIAGNOSIS — E538 Deficiency of other specified B group vitamins: Secondary | ICD-10-CM | POA: Diagnosis present

## 2013-04-05 DIAGNOSIS — Z9181 History of falling: Secondary | ICD-10-CM | POA: Diagnosis not present

## 2013-04-05 DIAGNOSIS — K2990 Gastroduodenitis, unspecified, without bleeding: Secondary | ICD-10-CM | POA: Diagnosis not present

## 2013-04-05 DIAGNOSIS — K2100 Gastro-esophageal reflux disease with esophagitis, without bleeding: Secondary | ICD-10-CM | POA: Diagnosis not present

## 2013-04-05 DIAGNOSIS — R1032 Left lower quadrant pain: Secondary | ICD-10-CM | POA: Diagnosis present

## 2013-04-05 DIAGNOSIS — R531 Weakness: Secondary | ICD-10-CM

## 2013-04-05 DIAGNOSIS — I2510 Atherosclerotic heart disease of native coronary artery without angina pectoris: Secondary | ICD-10-CM | POA: Diagnosis present

## 2013-04-05 DIAGNOSIS — R1310 Dysphagia, unspecified: Secondary | ICD-10-CM | POA: Diagnosis not present

## 2013-04-05 DIAGNOSIS — M6281 Muscle weakness (generalized): Secondary | ICD-10-CM | POA: Diagnosis not present

## 2013-04-05 DIAGNOSIS — K2970 Gastritis, unspecified, without bleeding: Secondary | ICD-10-CM | POA: Diagnosis not present

## 2013-04-05 DIAGNOSIS — M810 Age-related osteoporosis without current pathological fracture: Secondary | ICD-10-CM | POA: Diagnosis present

## 2013-04-05 DIAGNOSIS — G589 Mononeuropathy, unspecified: Secondary | ICD-10-CM | POA: Diagnosis not present

## 2013-04-05 DIAGNOSIS — R1312 Dysphagia, oropharyngeal phase: Secondary | ICD-10-CM | POA: Diagnosis not present

## 2013-04-05 DIAGNOSIS — M1990 Unspecified osteoarthritis, unspecified site: Secondary | ICD-10-CM | POA: Diagnosis present

## 2013-04-05 DIAGNOSIS — N138 Other obstructive and reflux uropathy: Secondary | ICD-10-CM | POA: Diagnosis present

## 2013-04-05 DIAGNOSIS — K219 Gastro-esophageal reflux disease without esophagitis: Secondary | ICD-10-CM | POA: Diagnosis present

## 2013-04-05 DIAGNOSIS — N281 Cyst of kidney, acquired: Secondary | ICD-10-CM | POA: Diagnosis not present

## 2013-04-05 DIAGNOSIS — E869 Volume depletion, unspecified: Secondary | ICD-10-CM | POA: Diagnosis not present

## 2013-04-05 DIAGNOSIS — E0590 Thyrotoxicosis, unspecified without thyrotoxic crisis or storm: Secondary | ICD-10-CM | POA: Diagnosis present

## 2013-04-05 DIAGNOSIS — R6889 Other general symptoms and signs: Secondary | ICD-10-CM | POA: Diagnosis not present

## 2013-04-05 DIAGNOSIS — K529 Noninfective gastroenteritis and colitis, unspecified: Secondary | ICD-10-CM | POA: Diagnosis present

## 2013-04-05 DIAGNOSIS — Z87891 Personal history of nicotine dependence: Secondary | ICD-10-CM

## 2013-04-05 DIAGNOSIS — E860 Dehydration: Secondary | ICD-10-CM | POA: Diagnosis not present

## 2013-04-05 DIAGNOSIS — N401 Enlarged prostate with lower urinary tract symptoms: Secondary | ICD-10-CM | POA: Diagnosis present

## 2013-04-05 DIAGNOSIS — D518 Other vitamin B12 deficiency anemias: Secondary | ICD-10-CM | POA: Diagnosis not present

## 2013-04-05 DIAGNOSIS — R296 Repeated falls: Secondary | ICD-10-CM

## 2013-04-05 DIAGNOSIS — R350 Frequency of micturition: Secondary | ICD-10-CM | POA: Diagnosis present

## 2013-04-05 LAB — COMPREHENSIVE METABOLIC PANEL
ALT: 14 U/L (ref 0–53)
AST: 22 U/L (ref 0–37)
Albumin: 3.5 g/dL (ref 3.5–5.2)
Alkaline Phosphatase: 71 U/L (ref 39–117)
BUN: 30 mg/dL — ABNORMAL HIGH (ref 6–23)
CALCIUM: 9.3 mg/dL (ref 8.4–10.5)
CO2: 20 meq/L (ref 19–32)
CREATININE: 1.14 mg/dL (ref 0.50–1.35)
Chloride: 104 mEq/L (ref 96–112)
GFR, EST AFRICAN AMERICAN: 66 mL/min — AB (ref >90)
GFR, EST NON AFRICAN AMERICAN: 57 mL/min — AB (ref >90)
GLUCOSE: 128 mg/dL — AB (ref 70–99)
Potassium: 3.7 mEq/L (ref 3.7–5.3)
Sodium: 140 mEq/L (ref 137–147)
TOTAL PROTEIN: 7 g/dL (ref 6.0–8.3)
Total Bilirubin: 0.4 mg/dL (ref 0.3–1.2)

## 2013-04-05 LAB — URINALYSIS, ROUTINE W REFLEX MICROSCOPIC
Bilirubin Urine: NEGATIVE
Glucose, UA: NEGATIVE mg/dL
Hgb urine dipstick: NEGATIVE
Ketones, ur: NEGATIVE mg/dL
LEUKOCYTES UA: NEGATIVE
NITRITE: NEGATIVE
PH: 5.5 (ref 5.0–8.0)
Protein, ur: NEGATIVE mg/dL
SPECIFIC GRAVITY, URINE: 1.038 — AB (ref 1.005–1.030)
UROBILINOGEN UA: 0.2 mg/dL (ref 0.0–1.0)

## 2013-04-05 LAB — GI PATHOGEN PANEL BY PCR, STOOL
C DIFFICILE TOXIN A/B: NEGATIVE
CAMPYLOBACTER BY PCR: NEGATIVE
CRYPTOSPORIDIUM BY PCR: NEGATIVE
E coli (ETEC) LT/ST: NEGATIVE
E coli (STEC): NEGATIVE
E coli 0157 by PCR: NEGATIVE
G LAMBLIA BY PCR: NEGATIVE
NOROVIRUS G1/G2: NEGATIVE
ROTAVIRUS A BY PCR: POSITIVE
Salmonella by PCR: NEGATIVE
Shigella by PCR: NEGATIVE

## 2013-04-05 LAB — CBC WITH DIFFERENTIAL/PLATELET
Basophils Absolute: 0 10*3/uL (ref 0.0–0.1)
Basophils Relative: 0 % (ref 0–1)
EOS ABS: 0.1 10*3/uL (ref 0.0–0.7)
EOS PCT: 1 % (ref 0–5)
HEMATOCRIT: 46.5 % (ref 39.0–52.0)
Hemoglobin: 16.2 g/dL (ref 13.0–17.0)
LYMPHS ABS: 0.6 10*3/uL — AB (ref 0.7–4.0)
Lymphocytes Relative: 10 % — ABNORMAL LOW (ref 12–46)
MCH: 32.2 pg (ref 26.0–34.0)
MCHC: 34.8 g/dL (ref 30.0–36.0)
MCV: 92.4 fL (ref 78.0–100.0)
MONO ABS: 0.9 10*3/uL (ref 0.1–1.0)
Monocytes Relative: 15 % — ABNORMAL HIGH (ref 3–12)
Neutro Abs: 4.6 10*3/uL (ref 1.7–7.7)
Neutrophils Relative %: 74 % (ref 43–77)
Platelets: 175 10*3/uL (ref 150–400)
RBC: 5.03 MIL/uL (ref 4.22–5.81)
RDW: 13.3 % (ref 11.5–15.5)
WBC: 6.2 10*3/uL (ref 4.0–10.5)

## 2013-04-05 LAB — LACTIC ACID, PLASMA: LACTIC ACID, VENOUS: 1.4 mmol/L (ref 0.5–2.2)

## 2013-04-05 LAB — CLOSTRIDIUM DIFFICILE BY PCR: CDIFFPCR: NEGATIVE

## 2013-04-05 MED ORDER — ONDANSETRON HCL 4 MG PO TABS: 4.0000 mg | ORAL_TABLET | Freq: Four times a day (QID) | ORAL | Status: DC | PRN

## 2013-04-05 MED ORDER — ONDANSETRON HCL 4 MG/2ML IJ SOLN
4.0000 mg | Freq: Four times a day (QID) | INTRAMUSCULAR | Status: DC | PRN
  Administered 2013-04-05: 4 mg via INTRAVENOUS
  Filled 2013-04-05: qty 2

## 2013-04-05 MED ORDER — SODIUM CHLORIDE 0.9 % IV BOLUS (SEPSIS)
1000.0000 mL | Freq: Once | INTRAVENOUS | Status: AC
  Administered 2013-04-05: 1000 mL via INTRAVENOUS

## 2013-04-05 MED ORDER — ACETAMINOPHEN 650 MG RE SUPP: 650.0000 mg | Freq: Four times a day (QID) | RECTAL | Status: DC | PRN

## 2013-04-05 MED ORDER — IOHEXOL 300 MG/ML  SOLN
50.0000 mL | Freq: Once | INTRAMUSCULAR | Status: AC | PRN
  Administered 2013-04-05: 50 mL via ORAL

## 2013-04-05 MED ORDER — ACETAMINOPHEN 325 MG PO TABS
650.0000 mg | ORAL_TABLET | Freq: Four times a day (QID) | ORAL | Status: DC | PRN
  Filled 2013-04-05: qty 2

## 2013-04-05 MED ORDER — CALCIUM CARBONATE-VITAMIN D 500-200 MG-UNIT PO TABS
2.0000 | ORAL_TABLET | Freq: Every day | ORAL | Status: DC
  Administered 2013-04-06 – 2013-04-10 (×5): 2 via ORAL
  Filled 2013-04-05 (×7): qty 2

## 2013-04-05 MED ORDER — TAMSULOSIN HCL 0.4 MG PO CAPS
0.4000 mg | ORAL_CAPSULE | Freq: Every day | ORAL | Status: DC
  Administered 2013-04-05 – 2013-04-09 (×5): 0.4 mg via ORAL
  Filled 2013-04-05 (×7): qty 1

## 2013-04-05 MED ORDER — ASPIRIN 325 MG PO TABS
325.0000 mg | ORAL_TABLET | Freq: Every day | ORAL | Status: DC
  Administered 2013-04-05 – 2013-04-10 (×6): 325 mg via ORAL
  Filled 2013-04-05 (×6): qty 1

## 2013-04-05 MED ORDER — ATORVASTATIN CALCIUM 20 MG PO TABS
20.0000 mg | ORAL_TABLET | Freq: Every day | ORAL | Status: DC
  Administered 2013-04-05 – 2013-04-09 (×5): 20 mg via ORAL
  Filled 2013-04-05 (×7): qty 1

## 2013-04-05 MED ORDER — SIMVASTATIN 40 MG PO TABS: 40.0000 mg | ORAL_TABLET | Freq: Every day | ORAL | Status: DC

## 2013-04-05 MED ORDER — SODIUM CHLORIDE 0.9 % IV SOLN
INTRAVENOUS | Status: DC
  Administered 2013-04-05 – 2013-04-08 (×10): via INTRAVENOUS

## 2013-04-05 MED ORDER — ENOXAPARIN SODIUM 40 MG/0.4ML ~~LOC~~ SOLN
40.0000 mg | SUBCUTANEOUS | Status: DC
  Administered 2013-04-05 – 2013-04-09 (×5): 40 mg via SUBCUTANEOUS
  Filled 2013-04-05 (×6): qty 0.4

## 2013-04-05 MED ORDER — SODIUM CHLORIDE 0.9 % IV SOLN
1000.0000 mL | Freq: Once | INTRAVENOUS | Status: AC
  Administered 2013-04-05: 1000 mL via INTRAVENOUS

## 2013-04-05 MED ORDER — METOPROLOL TARTRATE 100 MG PO TABS
100.0000 mg | ORAL_TABLET | Freq: Two times a day (BID) | ORAL | Status: DC
  Administered 2013-04-05 – 2013-04-10 (×11): 100 mg via ORAL
  Filled 2013-04-05 (×12): qty 1

## 2013-04-05 MED ORDER — MORPHINE SULFATE 2 MG/ML IJ SOLN: 0.5000 mg | INTRAMUSCULAR | Status: DC | PRN

## 2013-04-05 MED ORDER — OMEGA-3 FATTY ACIDS 1000 MG PO CAPS: 2.0000 g | ORAL_CAPSULE | Freq: Every day | ORAL | Status: DC

## 2013-04-05 MED ORDER — IOHEXOL 300 MG/ML  SOLN
100.0000 mL | Freq: Once | INTRAMUSCULAR | Status: AC | PRN
  Administered 2013-04-05: 100 mL via INTRAVENOUS

## 2013-04-05 MED ORDER — OMEGA-3-ACID ETHYL ESTERS 1 G PO CAPS
2.0000 g | ORAL_CAPSULE | Freq: Every day | ORAL | Status: DC
  Administered 2013-04-05 – 2013-04-09 (×5): 2 g via ORAL
  Filled 2013-04-05 (×6): qty 2

## 2013-04-05 MED ORDER — SODIUM CHLORIDE 0.9 % IV SOLN: 1000.0000 mL | INTRAVENOUS | Status: DC

## 2013-04-05 MED ORDER — AMLODIPINE BESYLATE 5 MG PO TABS
5.0000 mg | ORAL_TABLET | Freq: Every day | ORAL | Status: DC
  Administered 2013-04-05: 5 mg via ORAL
  Filled 2013-04-05 (×2): qty 1

## 2013-04-05 NOTE — ED Notes (Signed)
Bed: WA23 Expected date:  Expected time:  Means of arrival:  Comments: EMS-weakness 

## 2013-04-05 NOTE — H&P (Addendum)
[facility] History and Physical  [name] [name] [id number] DOB: [birth year] DOA: 04/05/2013  Referring physician: Dr Sharol Given PCP: follows at [facility]  Chief Complaint:    HPI:  80 year old male with history of hypertension, arthritis, peripheral neuropathy, vitamin B12 deficiency, who was in the ED 2 days back for mechanical fall with fracture of the right nasal bone was brought to the ED by family with symptoms of several episodes of nausea and vomiting associated with this he had 4 episodes of watery diarrhea since yesterday. Patient has been feeling very weak for past few weeks with frequent falls and unsteady gait. Family informed that patient has been unsteady because of neuropathy for quite sometime but symptoms have been worsening lately. The patient had several episodes of vomiting mainly comprising of particles. He also reports crampy abdominal pain mainly over his left lower quadrant.  patient reports that he has not urinated since yesterday and has not felt the urge to urinate. Denies any blood in stool. Denies fever but reports some chills. Denies recent travel or sick contacts. Denies body aches.. Patient reports feeling very weak.denies syncopal episodes. Patient denies headache, dizziness, chest pain, palpitations, SOB,  Denies change in weight.  Course in the ED  Patient appeared dehydrated clinically and was given 2 L IV normal saline. She was still nauseous and had 2  loose bowel movements in the ED. Labs showed normal CBC . Chemistry with mild AG and elevated BUN of 30.  Hospitalists called for admission on observation for dehydration and gastroenteritis. Family also wished patient to be evaluated for skilled nursing facility placement as patient is by himself and has been unsteady with frequent falls.  Review of Systems: ( positive symptoms in bold)  Constitutional: Denies fever,  diaphoresis,chills, appetite change and fatigue.  HEENT: Denies photophobia, , ear pain,  congestion, sore throat, rhinorrhea, sneezing, mouth sores, trouble swallowing, neck pain, neck stiffness and tinnitus.   Respiratory: Denies SOB, DOE, cough, chest tightness,  and wheezing.   Cardiovascular: Denies chest pain, palpitations and leg swelling.  Gastrointestinal:nausea, vomiting, abdominal pain, diarrhea, denies constipation, blood in stool and abdominal distention.  Genitourinary: not urinated since last afternoon, Denies dysuria, urgency, frequency, hematuria, flank pain and difficulty urinating.  Endocrine: Denies hot or cold intolerance  polyuria, polydipsia. Musculoskeletal: Denies myalgias, back pain, joint swelling, gait problem.  Skin: Denies pallor, rash and wound.  Neurological: weakness, Denies dizziness, seizures, syncope, , light-headedness, numbness and headaches.  Psychiatric/Behavioral: Denies  confusion, nervousness,   Past Medical History  Diagnosis Date  . Arthritis   . Hypertension   . Osteoporosis    Past Surgical History  Procedure Laterality Date  . Spine surgery     Social History:  reports that he has quit smoking. He does not have any smokeless tobacco history on file. He reports that he does not drink alcohol or use illicit drugs.  Allergies  Allergen Reactions  . Cortisone     Causes emotional problems     History reviewed. No pertinent family history.  Prior to Admission medications   Medication Sig Start Date End Date Taking? Authorizing Provider  amLODipine (NORVASC) 10 MG tablet Take 5 mg by mouth at bedtime.   Yes Historical Provider, MD  aspirin 325 MG tablet Take 325 mg by mouth daily.   Yes Historical Provider, MD  calcium-vitamin D (OSCAL WITH D) 500-200 MG-UNIT per tablet Take 2 tablets by mouth daily with breakfast.   Yes Historical Provider, MD  cyanocobalamin (,VITAMIN B-12,) 1000 MCG/ML injection  Inject 1,000 mcg into the muscle every 30 (thirty) days.    Yes Historical Provider, MD  fish oil-omega-3 fatty acids 1000 MG  capsule Take 2 g by mouth daily.   Yes Historical Provider, MD  Ibuprofen-Diphenhydramine Cit (IBUPROFEN PM) 200-38 MG TABS Take 1 tablet by mouth at bedtime as needed. For sleep   Yes Historical Provider, MD  lisinopril-hydrochlorothiazide (PRINZIDE,ZESTORETIC) 20-12.5 MG per tablet Take 1 tablet by mouth 2 (two) times daily.   Yes Historical Provider, MD  metoprolol (LOPRESSOR) 100 MG tablet Take 100 mg by mouth 2 (two) times daily.   Yes Historical Provider, MD  naproxen sodium (ANAPROX) 220 MG tablet Take 220 mg by mouth 2 (two) times daily as needed (pain).   Yes Historical Provider, MD  pravastatin (PRAVACHOL) 80 MG tablet Take 80 mg by mouth every evening.    Yes Historical Provider, MD     Physical Exam:  Filed Vitals:   04/05/13 0615 04/05/13 0700 04/05/13 0730 04/05/13 0800  BP:   149/78 162/73  Pulse: 74 71 69 75  Temp:      TempSrc:      Resp: 22 27 20 25   SpO2: 96% 96% 94% 95%    Constitutional: Vital signs reviewed.  Patient is an elderly male in no acute distress. HEENT:swelling of nose with bruising from recent fall and right nasal bone fracture. Bruising over her infraorbital area and suture her over the bridge of the nose.  no pallor, no icterus, dry oral mucosa, no cervical lymphadenopathy Cardiovascular: RRR, S1 normal, S2 normal, no MRG Chest: CTAB, no wheezes, rales, or rhonchi Abdominal: Soft.  non-distended, bowel sounds are normal, tender to palpation or left lower quadrant and suprapubic area . no masses, organomegaly, or guarding present.  GU: Right CVA tenderness Ext: warm, no edema Neurological: A&O x3, non focal  Labs on Admission:  Basic Metabolic Panel:  Recent Labs Lab 03/31/13 1601 04/05/13 0300  NA 140 140  K 4.2 3.7  CL 101 104  CO2 26 20  GLUCOSE 135* 128*  BUN 28* 30*  CREATININE 1.04 1.14  CALCIUM 9.6 9.3   Liver Function Tests:  Recent Labs Lab 04/05/13 0300  AST 22  ALT 14  ALKPHOS 71  BILITOT 0.4  PROT 7.0  ALBUMIN 3.5    No results found for this basename: LIPASE, AMYLASE,  in the last 168 hours No results found for this basename: AMMONIA,  in the last 168 hours CBC:  Recent Labs Lab 03/31/13 1601 04/05/13 0300  WBC 7.5 6.2  NEUTROABS 5.2 4.6  HGB 16.2 16.2  HCT 47.2 46.5  MCV 93.3 92.4  PLT 194 175   Cardiac Enzymes: No results found for this basename: CKTOTAL, CKMB, CKMBINDEX, TROPONINI,  in the last 168 hours BNP: No components found with this basename: POCBNP,  CBG: No results found for this basename: GLUCAP,  in the last 168 hours  Radiological Exams on Admission: No results found.    Assessment/Plan   Principal Problem:   Gastroenteritis, acute Admit under observation to MedSurg Continue IV hydration with normal saline at 125 cc per hour -Check lactic acid level. I will pain a CT scan of the abdomen and pelvis with contrast given symptoms and LLQ tenderness to rule out for diverticulitis or acute colitis. -Keep n.p.o. -Supportive care with focus pain and morphine for pain and Zofran for nausea. -Send stool for C. difficile and GI pathogen -Check UA and urine culture. Patient reports unable to urinate since yesterday. perform  bedside bladder scan to r/o BOO and perform in and out catheter for UA and urine culture. Will place foley if urinary retention noted.  Active Problems:   Dehydration Continue IV hydration monitor.  Frequent falls with weakness Obtain PT eval Patient has   Peripheral neuropathy Patient is by himself and family concerned about his safety at home. He may benefit for short-term rehabilitation    Hypertension Resume amlodipine and metoprolol.  will hold  HCTZ-lisinopril      Diet:NPO except meds DVT prophylaxis: sq lovenox   Code Status: full code Family Communication: discussed with son in law  at bedside Disposition Plan: pending PT eval  Danner Paulding, [place] [facility] Pager [phone]  Total time spent on admission :50 minutes  If  7PM-7AM, please contact night-coverage [email or web address] Password [email or web address] 04/05/2013, 8:10 AM

## 2013-04-05 NOTE — ED Notes (Signed)
Pt is still unable to urinate.

## 2013-04-05 NOTE — Progress Notes (Addendum)
Patient had 900 cc on bladder scan in ED. Was able to urinate in the floor but again had about 300 cc retention. Foley placed in . Needs I/O monitoring. CT showing enlarged prostate. Also shows tiny sclerotic densities throughout spine and pelvis which cannot r/o blastic metastatic ds and recommends bone scan which can be done as outpt.  added flomax . May  Discharge on foley with urology follow up as outpt. Stool for c diff negative. CT also comments on possible atelectasis vs pneumonia. Given absence of symptoms, will hold off on abx.

## 2013-04-05 NOTE — ED Notes (Signed)
Pt is unable to urinate. Family in room says the Pt has been unable to urinate since Friday.   A urinal was left at the bedside with instructions to use call bell if the need to urinate arises.

## 2013-04-05 NOTE — ED Notes (Signed)
N/V/D for several days with poor PO intake Patient with bilateral LE neuropathy, so unable to stand--EMS unable to perform orthostatic VS Patient fell last week and broke his nose--bruising noted to face 20 L forearm placed by EMS

## 2013-04-05 NOTE — ED Notes (Signed)
Per previous shift RN, EDP did not want Pt catheterized to obtain urine sample.

## 2013-04-05 NOTE — ED Provider Notes (Signed)
CSN: [id number]     Arrival date & time 04/05/13  0243 History   First MD Initiated Contact with Patient 04/05/13 [phone]     Chief Complaint  Patient presents with  . Nausea     (Consider location/radiation/quality/duration/timing/severity/associated sxs/prior Treatment) HPI 80 year old male presents to emergency department from home via EMS with complaint of diarrhea starting yesterday afternoon.  Family gave Kaopectate and Imodium with only slight improvement.  Starting at midnight.  He began to have vomiting.  Family reports mucous mixed with clots of blood.  Patient had fall last week with nasal bone fracture.  He has not been able to blow his nose.  Due to fracture, and has had some nasal drainage of blood.  Patient has not had urine output since this afternoon.  He has had generalized weakness.  He denies any focal abdominal pain. Past Medical History  Diagnosis Date  . Arthritis   . Hypertension   . Osteoporosis    Past Surgical History  Procedure Laterality Date  . Spine surgery     History reviewed. No pertinent family history. History  Substance Use Topics  . Smoking status: Former Research scientist (life sciences)  . Smokeless tobacco: Not on file  . Alcohol Use: No    Review of Systems  See History of Present Illness; otherwise all other systems are reviewed and negative   Allergies  Cortisone  Home Medications   Current Outpatient Rx  Name  Route  Sig  Dispense  Refill  . amLODipine (NORVASC) 10 MG tablet   Oral   Take 5 mg by mouth at bedtime.         [name] aspirin 325 MG tablet   Oral   Take 325 mg by mouth daily.         . calcium-vitamin D (OSCAL WITH D) 500-200 MG-UNIT per tablet   Oral   Take 2 tablets by mouth daily with breakfast.         . cyanocobalamin (,VITAMIN B-12,) 1000 MCG/ML injection   Intramuscular   Inject 1,000 mcg into the muscle every 30 (thirty) days.          . fish oil-omega-3 fatty acids 1000 MG capsule   Oral   Take 2 g by mouth daily.          . Ibuprofen-Diphenhydramine Cit (IBUPROFEN PM) 200-38 MG TABS   Oral   Take 1 tablet by mouth at bedtime as needed. For sleep         . lisinopril-hydrochlorothiazide (PRINZIDE,ZESTORETIC) 20-12.5 MG per tablet   Oral   Take 1 tablet by mouth 2 (two) times daily.         . metoprolol (LOPRESSOR) 100 MG tablet   Oral   Take 100 mg by mouth 2 (two) times daily.         . naproxen sodium (ANAPROX) 220 MG tablet   Oral   Take 220 mg by mouth 2 (two) times daily as needed (pain).         . pravastatin (PRAVACHOL) 80 MG tablet   Oral   Take 80 mg by mouth every evening.           BP 132/74  Pulse 76  Temp(Src) 98.4 F (36.9 C) (Oral)  Resp 21  SpO2 95% Physical Exam  Nursing note and vitals reviewed. Constitutional: He is oriented to person, place, and time. He appears well-developed and well-nourished. No distress.  HENT:  Head: Normocephalic and atraumatic.  Nose: Nose normal.  Mouth/Throat: Oropharynx  is clear and moist.  Bruising noted to the face, under eyes and across bridge of nose.  Dry mucous membranes  Eyes: Conjunctivae and EOM are normal. Pupils are equal, round, and reactive to light.  Neck: Normal range of motion. Neck supple. No JVD present. No tracheal deviation present. No thyromegaly present.  Cardiovascular: Normal rate, regular rhythm, normal heart sounds and intact distal pulses.  Exam reveals no gallop and no friction rub.   No murmur heard. Pulmonary/Chest: Effort normal and breath sounds normal. No stridor. No respiratory distress. He has no wheezes. He has no rales. He exhibits no tenderness.  Abdominal: Soft. Bowel sounds are normal. He exhibits no distension and no mass. There is tenderness (mild diffuse abdominal pain). There is no rebound and no guarding.  Musculoskeletal: Normal range of motion. He exhibits no edema and no tenderness.  Bruising to inside right upper arm  Lymphadenopathy:    He has no cervical adenopathy.   Neurological: He is alert and oriented to person, place, and time. He exhibits normal muscle tone. Coordination normal.  Skin: Skin is warm and dry. No rash noted. No erythema. No pallor.  Psychiatric: He has a normal mood and affect. His behavior is normal. Judgment and thought content normal.    ED Course  Procedures (including critical care time) Labs Review Labs Reviewed  CBC WITH DIFFERENTIAL - Abnormal; Notable for the following:    Lymphocytes Relative 10 (*)    Lymphs Abs 0.6 (*)    Monocytes Relative 15 (*)    All other components within normal limits  COMPREHENSIVE METABOLIC PANEL - Abnormal; Notable for the following:    Glucose, Bld 128 (*)    BUN 30 (*)    GFR calc non Af Amer 57 (*)    GFR calc Af Amer 66 (*)    All other components within normal limits  URINALYSIS, ROUTINE W REFLEX MICROSCOPIC   Imaging Review No results found.   EKG Interpretation None      MDM   Final diagnoses:  Dehydration  Nausea vomiting and diarrhea  Weakness  Frequent falls    80 year old male with one day of nausea, vomiting, diarrhea.  Lab work, reassuring.  Patient has had 3 L, normal saline, but has not yet been able to urinate.  Will try PO challenge, may need admission if he is unable to urinate despite IV fluids.    Kalman Drape, MD 04/05/13 [phone]

## 2013-04-05 NOTE — ED Notes (Signed)
Pt alert and oriented x4. Respirations even and unlabored. Bilateral rise and fall of chest. Skin warm and dry. In no acute distress. Denies needs.

## 2013-04-05 NOTE — ED Notes (Signed)
MD at bedside. 

## 2013-04-05 NOTE — Progress Notes (Addendum)
Patients daughter expressed concern about swallowing issues at home. She is requesting a swallow evaluation. Patient is tolerating a clear diet without difficulty. Will continue to monitor patient.

## 2013-04-05 NOTE — ED Notes (Signed)
Dr Otter at bedside  

## 2013-04-06 DIAGNOSIS — N320 Bladder-neck obstruction: Secondary | ICD-10-CM | POA: Diagnosis present

## 2013-04-06 DIAGNOSIS — R5383 Other fatigue: Secondary | ICD-10-CM

## 2013-04-06 DIAGNOSIS — Z9181 History of falling: Secondary | ICD-10-CM | POA: Diagnosis not present

## 2013-04-06 DIAGNOSIS — K219 Gastro-esophageal reflux disease without esophagitis: Secondary | ICD-10-CM | POA: Diagnosis present

## 2013-04-06 DIAGNOSIS — R5381 Other malaise: Secondary | ICD-10-CM | POA: Diagnosis not present

## 2013-04-06 DIAGNOSIS — E860 Dehydration: Secondary | ICD-10-CM | POA: Diagnosis not present

## 2013-04-06 DIAGNOSIS — K5289 Other specified noninfective gastroenteritis and colitis: Secondary | ICD-10-CM | POA: Diagnosis not present

## 2013-04-06 LAB — BASIC METABOLIC PANEL
BUN: 15 mg/dL (ref 6–23)
CALCIUM: 8.1 mg/dL — AB (ref 8.4–10.5)
CO2: 19 mEq/L (ref 19–32)
Chloride: 108 mEq/L (ref 96–112)
Creatinine, Ser: 0.91 mg/dL (ref 0.50–1.35)
GFR calc Af Amer: 87 mL/min — ABNORMAL LOW (ref >90)
GFR calc non Af Amer: 75 mL/min — ABNORMAL LOW (ref >90)
GLUCOSE: 84 mg/dL (ref 70–99)
Potassium: 3.6 mEq/L — ABNORMAL LOW (ref 3.7–5.3)
Sodium: 138 mEq/L (ref 137–147)

## 2013-04-06 LAB — VITAMIN B12: Vitamin B-12: 613 pg/mL (ref 211–911)

## 2013-04-06 LAB — TSH: TSH: 0.28 u[IU]/mL — ABNORMAL LOW (ref 0.350–4.500)

## 2013-04-06 MED ORDER — AMLODIPINE BESYLATE 10 MG PO TABS
10.0000 mg | ORAL_TABLET | Freq: Every day | ORAL | Status: DC
  Administered 2013-04-06 – 2013-04-09 (×4): 10 mg via ORAL
  Filled 2013-04-06 (×6): qty 1

## 2013-04-06 MED ORDER — PANTOPRAZOLE SODIUM 40 MG PO TBEC
40.0000 mg | DELAYED_RELEASE_TABLET | Freq: Every day | ORAL | Status: DC
  Administered 2013-04-06 – 2013-04-10 (×5): 40 mg via ORAL
  Filled 2013-04-06 (×5): qty 1

## 2013-04-06 MED ORDER — POTASSIUM CHLORIDE CRYS ER 20 MEQ PO TBCR
40.0000 meq | EXTENDED_RELEASE_TABLET | Freq: Once | ORAL | Status: AC
  Administered 2013-04-06: 40 meq via ORAL
  Filled 2013-04-06: qty 2

## 2013-04-06 NOTE — Progress Notes (Addendum)
[facility] PROGRESS NOTE  [name] [name] [id number] DOB: 1925-08-18 DOA: 04/05/2013 PCP: [name], MD  Assessment/Plan: Gastroenteritis, acute  -Secondary to rotavirus infection. Stool for C. difficile negative. Continue IV hydration with normal saline at 125 cc per hour  -CT scan of the abdomen and pelvis negative for obstruction or colitis. Stool is enlarged prostate with distended bladder suggestive alter ego BPH or prostate malignancy. Also for finding of gastroesophageal reflux. -Continue clears. -Supportive care with pain medications and antiemetics  Active Problems:  Dehydration  Improving. Continue IV hydration   Frequent falls with weakness  Patient seen by physical therapy and recommended for skilled nursing facility as he is quite weak and unsteady. Also he lives alone at home. Patient has Peripheral neuropathy as well  Hypertension  Resume amlodipine and metoprolol. will hold HCTZ-lisinopril   Bladder outflow obstruction Patient gives history oral hesitancy and increased urinary frequency at home. Enlarged prostate and urinary bladder on CT scan. Given urinary retention and on bladder scan a Foley catheter was placed on admission. I have started him on Flomax. CT scan also shows tiny scattered densities throughout the spine and pelvis which cannot rule out blastic metastases disease. He really and bone scan as outpatient.  History of coronary artery disease Continue metoprolol and statin. Currently asymptomatic. He'll need outpatient cardiology followup. Previously seen by [facility] heart and vascular.  GERD and dysphagia Daughter reports episodes of choking and coughing while eating. We'll start him on PPI. Swallow eval.  Diet: Clear liquids DVT prophylaxis: sq lovenox      Code Status: Full code Family Communication: Discussed with daughter at bedside Disposition Plan: Skilled nursing  facility   Consultants:  none  Procedures:  None  Antibiotics:  None  HPI/Subjective: Patient seen and examined this morning. He informs feeling better but still very tired and unsteady when trying to get up.  Objective: Filed Vitals:   04/06/13 1402  BP: 165/75  Pulse: 66  Temp: 97.9 F (36.6 C)  Resp: 20    Intake/Output Summary (Last 24 hours) at 04/06/13 1419 Last data filed at 04/06/13 1100  Gross per 24 hour  Intake   2005 ml  Output   1401 ml  Net    604 ml   Filed Weights   04/05/13 1014  Weight: 87.1 kg (192 lb 0.3 oz)    Exam: Gen.: Patient is an elderly male in no acute distress.  HEENT:swelling of nose with bruising from recent fall and right nasal bone fracture. Bruising over infraorbital area and suture  over the bridge of the nose. no pallor, no icterus, dry oral mucosa,  Cardiovascular: RRR, S1 normal, S2 normal, no MRG  Chest: CTAB, no wheezes, rales, or rhonchi  Abdominal: Soft. non-distended, bowel sounds are normal, nontender Ext: warm, no edema Neurological: A&O x3, non focal  Data Reviewed: Basic Metabolic Panel:  Recent Labs Lab 03/31/13 1601 04/05/13 0300 04/06/13 0453  NA 140 140 138  K 4.2 3.7 3.6*  CL 101 104 108  CO2 26 20 19   GLUCOSE 135* 128* 84  BUN 28* 30* 15  CREATININE 1.04 1.14 0.91  CALCIUM 9.6 9.3 8.1*   Liver Function Tests:  Recent Labs Lab 04/05/13 0300  AST 22  ALT 14  ALKPHOS 71  BILITOT 0.4  PROT 7.0  ALBUMIN 3.5   No results found for this basename: LIPASE, AMYLASE,  in the last 168 hours No results found for this basename: AMMONIA,  in the last 168 hours CBC:  Recent  Labs Lab 03/31/13 1601 04/05/13 0300  WBC 7.5 6.2  NEUTROABS 5.2 4.6  HGB 16.2 16.2  HCT 47.2 46.5  MCV 93.3 92.4  PLT 194 175   Cardiac Enzymes: No results found for this basename: CKTOTAL, CKMB, CKMBINDEX, TROPONINI,  in the last 168 hours BNP (last 3 results) No results found for this basename: PROBNP,  in the  last 8760 hours CBG: No results found for this basename: GLUCAP,  in the last 168 hours  Recent Results (from the past 240 hour(s))  URINE CULTURE     Status: None   Collection Time    03/31/13  5:49 PM      Result Value Ref Range Status   Specimen Description URINE, CLEAN CATCH   Final   Special Requests NONE   Final   Culture  Setup Time     Final   Value: 04/01/2013 01:12     Performed at [place] [facility]     Final   Value: NO GROWTH     Performed at [facility]   Culture     Final   Value: NO GROWTH     Performed at [facility]   Report Status 04/02/2013 FINAL   Final  CLOSTRIDIUM DIFFICILE BY PCR     Status: None   Collection Time    04/05/13  8:12 AM      Result Value Ref Range Status   C difficile by pcr NEGATIVE  NEGATIVE Final   Comment: Performed at [facility]     Studies: Ct Abdomen Pelvis W Contrast  04/05/2013   CLINICAL DATA:  Nausea, vomiting, diarrhea. Left lower quadrant pain.  EXAM: CT ABDOMEN AND PELVIS WITH CONTRAST  TECHNIQUE: Multidetector CT imaging of the abdomen and pelvis was performed using the standard protocol following bolus administration of intravenous contrast.  CONTRAST:  152mL OMNIPAQUE IOHEXOL 300 MG/ML  SOLN  COMPARISON:  None.  FINDINGS: Innumerable hepatic well-circumscribed lucencies consistent with multiple cysts noted. Largest measures 3.8 cm in maximum diameter, image number 16/series 2. Spleen normal. Splenosis. Pancreas normal. Gallbladder nondistended. No biliary distention.  Adrenals are normal. Multiple small approximately 1 cm right renal cysts are present. No hydronephrosis. No evidence of obstructing ureteral stone. The bladder is distended. The prostate is moderately enlarged with a slightly irregular contour. BPH and/or prostate malignancy cannot be excluded .  No significant adenopathy. A 2.7 x 2.5 cm small infrarenal suprailiac abdominal aortic aneurysm is present. Visceral vessels are  patent.  Appendix is normal. Large amount of fluid is noted colon. This is most consistent with a diarrheal illness. There is no evidence of bowel distention. No free air. Stomach is nondistended. Oral contrast is in the distal esophagus. Gastroesophageal reflux cannot be excluded.  Mild basilar atelectasis versus pneumonia. Small pleural effusions versus pleural scarring. Cardiomegaly. Coronary artery disease. Tiny sclerotic densities in the pelvis as well as the thoracolumbar vertebral bodies. These may just represent bone islands. However whole-body bone scan is suggested to evaluate for blastic metastatic disease.  IMPRESSION: 1. Prominent amount of fluid noted throughout the colon. This is most consistent with a diarrheal illness. No evidence of bowel obstruction.  2. Prostate is moderately enlarged with irregular contour. The bladder is distended. BPH and/or prostate malignancy cannot be excluded.  3. Oral contrast is noted in the distal esophagus. Gastroesophageal reflux cannot be excluded.  4.  Mild bibasilar atelectasis versus pneumonia.  5. Tiny sclerotic densities noted throughout the spine  and pelvis. Although these may represent tiny bone islands blastic metastatic disease cannot be excluded and whole body bone scan suggested.  6. Small 2.7 x 2.5 cm infrarenal suprailiac abdominal aortic aneurysm.  7. Cardiomegaly.  Coronary artery disease.   Electronically Signed   By: Marcello Moores  Register   On: 04/05/2013 09:15    Scheduled Meds: . amLODipine  10 mg Oral QHS  . aspirin  325 mg Oral Daily  . atorvastatin  20 mg Oral q1800  . calcium-vitamin D  2 tablet Oral Q breakfast  . enoxaparin (LOVENOX) injection  40 mg Subcutaneous Q24H  . metoprolol  100 mg Oral BID  . omega-3 acid ethyl esters  2 g Oral Daily  . pantoprazole  40 mg Oral Daily  . tamsulosin  0.4 mg Oral QPC supper   Continuous Infusions: . sodium chloride 125 mL/hr at 04/06/13 1234      Time spent: 25 minutes    Jaystin Mcgarvey,  [place] [facility] Pager [phone] If 7PM-7AM, please contact night-coverage at [email or web address], password [facility] 04/06/2013, 2:19 PM  LOS: 1 day

## 2013-04-06 NOTE — Progress Notes (Signed)
UR completed. Patient changed to inpatient- requiring IVF @ 125cc/hr

## 2013-04-06 NOTE — Progress Notes (Signed)
Clinical Social Work Department BRIEF PSYCHOSOCIAL ASSESSMENT 04/06/2013  Patient:  [name] [name], [name] [name]     Account Number:  [id number]     Admit date:  04/05/2013  Clinical Social Worker:  Venia Minks  Date/Time:  04/06/2013 12:00 M  Referred by:  Physician  Date Referred:  04/06/2013 Referred for  SNF Placement   Other Referral:   Interview type:  Patient Other interview type:   daughter at bedside.    PSYCHOSOCIAL DATA Living Status:  FAMILY Admitted from facility:   Level of care:   Primary support name:  Richardson,Marta Primary support relationship to patient:  CHILD, ADULT Degree of support available:   good    CURRENT CONCERNS Current Concerns  Post-Acute Placement   Other Concerns:    SOCIAL WORK ASSESSMENT / PLAN CSW met with patient and patient's daughter at bedside. patient is alert and oriented X3. discussed probable need for snf. Patient states that he has previously been to [facility] and if he needs rehab, he would prefer to go back there. CSW spent an extensive amount of time with patient talking small talk. Patient spoke about coming to [place] from [place] in 1951 and how he left his country behind. He spoke to [place] about his life and is happy and at peace with everything that has happened to him.   Assessment/plan status:   Other assessment/ plan:   Information/referral to community resources:    PATIENT'S/FAMILY'S RESPONSE TO PLAN OF CARE: patient and daughter at bedside are both extraordinarily pleasant and cooperative. they are both in agreement for patient to go to [facility].

## 2013-04-06 NOTE — Evaluation (Signed)
Physical Therapy Evaluation Patient Details Name: [name] [name] MRN: [id number] DOB: 12-06-1925 Today's Date: 04/06/2013   History of Present Illness  Pt is an 80 year old male presenting to [facility] ED with nausea, vomiting, and left lower quadrant abdominal pain with a history of frequent falls (3 in the past week), hypertension, arthritis, peripheral neuropathy, vitamin B12 deficiency  Clinical Impression  Pt admitted with N/V and left lower quadrant pain.  Pt presented to ED two days ago with a broken nose after a fall; daughter reports pt has had 3 falls in the past week.  Pt currently with functional limitations due to the deficits listed below (see PT Problem List).  Pt's daughter expressed concerns about pt's transfers, stairs, and gait patterns being unsafe and leading to falls.  Pt pivot transferred from bed to chair due to fatigue after 2-3 steps.  Pt appears to have decreased awareness of safety.  Pt recommended discharge to SNF for continued PT. Pt will benefit from skilled PT to increase their independence and safety with mobility to allow discharge.    Follow Up Recommendations SNF    Equipment Recommendations  3in1 (PT)    Recommendations for Other Services       Precautions / Restrictions Precautions Precautions: Fall Restrictions Weight Bearing Restrictions: No      Mobility  Bed Mobility Overal bed mobility: Needs Assistance Bed Mobility: Supine to Sit;Sit to Supine     Supine to sit: Min guard Sit to supine: Min guard   General bed mobility comments: verbal cues for safety, sequence and positioning; took increased time to perform bed mobility  Transfers Overall transfer level: Needs assistance Equipment used: Rolling walker (2 wheeled) Transfers: Sit to/from [facility] Sit to Stand: Min assist;+2 safety/equipment Stand pivot transfers: Min assist;+2 safety/equipment       General transfer comment: verbal cuing for safety and sequence; min  guard to correct initial balance; took 2-3 steps upon standing, pt fatigued and pivoted to sit in chair  Ambulation/Gait                Stairs            Wheelchair Mobility    Modified Rankin (Stroke Patients Only)       Balance Overall balance assessment: Needs assistance;History of Falls Sitting-balance support: Feet supported;Bilateral upper extremity supported Sitting balance-Leahy Scale: Fair   Postural control: Other (comment) (daughter reports posterior lean) Standing balance support: Bilateral upper extremity supported Standing balance-Leahy Scale: Fair Standing balance comment: pt fatigued quickly in standing requiring more guarding for balance                     Pertinent Vitals/Pain Activity to tolerance.  Pt did not complain of pain.    Home Living Family/patient expects to be discharged to:: Private residence Living Arrangements: Alone Available Help at Discharge: Family;Friend(s);Available PRN/intermittently (alone during the day, multiple family members to help during the evening, alone overnight) Type of Home: House Home Access: Stairs to enter Entrance Stairs-Rails: Right Entrance Stairs-Number of Steps: 3 Home Layout: One level Home Equipment: Walker - 4 wheels;Wheelchair - manual (uses walker for community ambulation; uses wheelchair and walker in home)      Prior Function Level of Independence: Independent with assistive device(s)   Gait / Transfers Assistance Needed: per daughter bathroom is small and awkward for transfers and has fallen there before, walks down steps out of house by placing walker at bottom of stairs and holding the rail  with both hands; pt daughter very concerned about prior level of transfer and gait being unsafe and cause of falls  ADL's / Homemaking Assistance Needed: pt enters basement for home maintenance (one flight of stairs)  Comments: active participant in community life per daughter (book club, AA,  church)     Journalist, newspaper        Extremity/Trunk Assessment               Lower Extremity Assessment: Generalized weakness         Communication   Communication: Other (comment) (difficult to understand)  Cognition Arousal/Alertness: Awake/alert Behavior During Therapy: WFL for tasks assessed/performed Overall Cognitive Status: Within Functional Limits for tasks assessed                      General Comments      Exercises General Exercises - Lower Extremity Ankle Circles/Pumps: AROM;10 reps Short Arc Quad: AROM;Both;10 reps Heel Slides: AROM;Both;5 reps Hip ABduction/ADduction: AROM;Both;10 reps      Assessment/Plan    PT Assessment Patient needs continued PT services  PT Diagnosis Difficulty walking   PT Problem List Decreased strength;Decreased activity tolerance;Decreased balance;Decreased mobility;Decreased safety awareness;Decreased knowledge of use of DME  PT Treatment Interventions DME instruction;Gait training;Stair training;Functional mobility training;Therapeutic activities;Therapeutic exercise;Balance training;Patient/family education;Wheelchair mobility training   PT Goals (Current goals can be found in the Care Plan section) Acute Rehab PT Goals Patient Stated Goal: decrease falls and improve walking and transfers PT Goal Formulation: With patient/family Time For Goal Achievement: 04/20/13 Potential to Achieve Goals: Fair    Frequency Min 3X/week   Barriers to discharge        End of Session Equipment Utilized During Treatment: Gait belt Activity Tolerance: Patient tolerated treatment well Patient left: in chair;with call bell/phone within reach;with family/visitor present         Time: 0903-0925 PT Time Calculation (min): 22 min   Charges:   PT Evaluation $Initial PT Evaluation Tier I: 1 Procedure PT Treatments $Therapeutic Activity: 8-22 mins   PT G Codes:          Jacqulyn Cane 04/06/2013, 1:19 PM Jacqulyn Cane SPT 04/06/2013

## 2013-04-06 NOTE — Evaluation (Signed)
I have reviewed this note and agree with all findings. Kati Leocadia Idleman, PT, DPT Pager: [phone]   

## 2013-04-07 DIAGNOSIS — E860 Dehydration: Secondary | ICD-10-CM | POA: Diagnosis not present

## 2013-04-07 DIAGNOSIS — N320 Bladder-neck obstruction: Secondary | ICD-10-CM | POA: Diagnosis not present

## 2013-04-07 DIAGNOSIS — K5289 Other specified noninfective gastroenteritis and colitis: Secondary | ICD-10-CM | POA: Diagnosis not present

## 2013-04-07 DIAGNOSIS — Z9181 History of falling: Secondary | ICD-10-CM | POA: Diagnosis not present

## 2013-04-07 LAB — PSA: PSA: 1.42 ng/mL (ref <=4.00)

## 2013-04-07 MED ORDER — DIPHENHYDRAMINE HCL 25 MG PO CAPS
25.0000 mg | ORAL_CAPSULE | Freq: Once | ORAL | Status: AC
  Administered 2013-04-07: 25 mg via ORAL
  Filled 2013-04-07: qty 1

## 2013-04-07 MED ORDER — LISINOPRIL-HYDROCHLOROTHIAZIDE 20-12.5 MG PO TABS: 1.0000 | ORAL_TABLET | Freq: Two times a day (BID) | ORAL | Status: DC

## 2013-04-07 MED ORDER — LISINOPRIL 20 MG PO TABS
20.0000 mg | ORAL_TABLET | Freq: Two times a day (BID) | ORAL | Status: DC
  Administered 2013-04-07 – 2013-04-10 (×7): 20 mg via ORAL
  Filled 2013-04-07 (×8): qty 1

## 2013-04-07 MED ORDER — HYDROCHLOROTHIAZIDE 12.5 MG PO CAPS
12.5000 mg | ORAL_CAPSULE | Freq: Two times a day (BID) | ORAL | Status: DC
  Administered 2013-04-07 – 2013-04-10 (×7): 12.5 mg via ORAL
  Filled 2013-04-07 (×8): qty 1

## 2013-04-07 NOTE — Progress Notes (Signed)
[facility] PROGRESS NOTE  [name] [name] [id number] DOB: 1925-03-22 DOA: 04/05/2013 PCP: [name], MD  Assessment/Plan: Gastroenteritis, acute  -Secondary to rotavirus infection. Stool for C. difficile negative. Continue IV hydration with normal saline at 75 cc/ hr -CT scan of the abdomen and pelvis negative for obstruction or colitis. Stool is enlarged prostate with distended bladder suggestive alter ego BPH or prostate malignancy. Also for finding of gastroesophageal reflux. -advance diet to full liquids -Supportive care with pain medications and antiemetics  Active Problems:  Dehydration  Improving. Continue IV hydration   Frequent falls with weakness  Patient seen by physical therapy and recommended for skilled nursing facility as he is quite weak and unsteady. Also he lives alone at home.plan for d/c to SNF on 3/30 Patient has Peripheral neuropathy as well  Hypertension  Resume amlodipine and metoprolol. Will resume HCTZ and lisinopril as well since blood pressure is elevated.  Bladder outflow obstruction Patient gives history oral hesitancy and increased urinary frequency at home. Enlarged prostate and urinary bladder on CT scan. Given urinary retention and on bladder scan a Foley catheter was placed on admission. I have started him on Flomax. CT scan also shows tiny scattered densities throughout the spine and pelvis which cannot rule out blastic metastases disease. He will need bone scan as outpatient. -Patient will be called bilateral to neurology for outpatient evaluation for his BPH and voiding trial  History of coronary artery disease Continue metoprolol and statin. Currently asymptomatic. He'll need outpatient cardiology followup. Previously seen by [facility] heart and vascular.  GERD and dysphagia Daughter reports episodes of choking and coughing while eating. Started on PPI. Speech and swallow eval pending  Recent fall with a right nasal bone  fracture Stable. Sutures  removed today  Diet: Full liquids DVT prophylaxis: sq lovenox      Code Status: Full code Family Communication: Discussed with daughter at bedside Disposition Plan: Skilled nursing facility ([facility]) on 3/30   Consultants:  none  Procedures:  None  Antibiotics:  None  HPI/Subjective: Patient seen and examined this morning. Informs feeling better.  Objective: Filed Vitals:   04/07/13 0656  BP: 163/72  Pulse: 70  Temp: 98.3 F (36.8 C)  Resp: 18    Intake/Output Summary (Last 24 hours) at 04/07/13 1241 Last data filed at 04/07/13 0940  Gross per 24 hour  Intake    200 ml  Output   2700 ml  Net  -2500 ml   Filed Weights   04/05/13 1014  Weight: 87.1 kg (192 lb 0.3 oz)    Exam: Gen.: Patient is an elderly male in no acute distress.  HEENT:swelling of nose with bruising from recent fall and right nasal bone fracture. Bruising over infraorbital area.. no pallor, no icterus, dry oral mucosa,  Cardiovascular: RRR, S1 normal, S2 normal, no MRG  Chest: CTAB, no wheezes, rales, or rhonchi  Abdominal: Soft. non-distended, bowel sounds are normal, nontender Ext: warm, no edema Neurological: A&O x3, non focal  Data Reviewed: Basic Metabolic Panel:  Recent Labs Lab 03/31/13 1601 04/05/13 0300 04/06/13 0453  NA 140 140 138  K 4.2 3.7 3.6*  CL 101 104 108  CO2 26 20 19   GLUCOSE 135* 128* 84  BUN 28* 30* 15  CREATININE 1.04 1.14 0.91  CALCIUM 9.6 9.3 8.1*   Liver Function Tests:  Recent Labs Lab 04/05/13 0300  AST 22  ALT 14  ALKPHOS 71  BILITOT 0.4  PROT 7.0  ALBUMIN 3.5   No results  found for this basename: LIPASE, AMYLASE,  in the last 168 hours No results found for this basename: AMMONIA,  in the last 168 hours CBC:  Recent Labs Lab 03/31/13 1601 04/05/13 0300  WBC 7.5 6.2  NEUTROABS 5.2 4.6  HGB 16.2 16.2  HCT 47.2 46.5  MCV 93.3 92.4  PLT 194 175   Cardiac Enzymes: No results found for this  basename: CKTOTAL, CKMB, CKMBINDEX, TROPONINI,  in the last 168 hours BNP (last 3 results) No results found for this basename: PROBNP,  in the last 8760 hours CBG: No results found for this basename: GLUCAP,  in the last 168 hours  Recent Results (from the past 240 hour(s))  URINE CULTURE     Status: None   Collection Time    03/31/13  5:49 PM      Result Value Ref Range Status   Specimen Description URINE, CLEAN CATCH   Final   Special Requests NONE   Final   Culture  Setup Time     Final   Value: 04/01/2013 01:12     Performed at [place]     Final   Value: NO GROWTH     Performed at [facility]   Culture     Final   Value: NO GROWTH     Performed at [facility]   Report Status 04/02/2013 FINAL   Final  CLOSTRIDIUM DIFFICILE BY PCR     Status: None   Collection Time    04/05/13  8:12 AM      Result Value Ref Range Status   C difficile by pcr NEGATIVE  NEGATIVE Final   Comment: Performed at [facility]     Studies: No results found.  Scheduled Meds: . amLODipine  10 mg Oral QHS  . aspirin  325 mg Oral Daily  . atorvastatin  20 mg Oral q1800  . calcium-vitamin D  2 tablet Oral Q breakfast  . enoxaparin (LOVENOX) injection  40 mg Subcutaneous Q24H  . metoprolol  100 mg Oral BID  . omega-3 acid ethyl esters  2 g Oral Daily  . pantoprazole  40 mg Oral Daily  . tamsulosin  0.4 mg Oral QPC supper   Continuous Infusions: . sodium chloride 125 mL/hr at 04/07/13 1203      Time spent: 25 minutes    [name] [name], [place] [facility] Pager [phone] If 7PM-7AM, please contact night-coverage at [email or web address], password [facility] 04/07/2013, 12:41 PM  LOS: 2 days

## 2013-04-07 NOTE — Progress Notes (Signed)
Patient has bed at [facility] for Monday.  Maribeth Jiles C. Madison MSW, [place]

## 2013-04-07 NOTE — Evaluation (Addendum)
Clinical/Bedside Swallow Evaluation Patient Details  Name: [name] [name] MRN: [id number] Date of Birth: 06-24-1925  Today's Date: 04/07/2013 Time: [id number] SLP Time Calculation (min): 42 min  Past Medical History:  Past Medical History  Diagnosis Date  . Arthritis   . Hypertension   . Osteoporosis    Past Surgical History:  Past Surgical History  Procedure Laterality Date  . Spine surgery     HPI:  80 yo male adm to [facility] with n/v and diarrhea - cdif negative diagnosed with gastroenteritis. Pt recently fell and had right nasal bone fractures.  He has h/o peripheral neuropathy.    CT head 3/20 negative for acute event, abdomen of pelvis with contrast showed mild basilar ATX vs pna, gastroreflux not excluded-oral contrast in distal esophagus.  Diet advanced to full liquid diet today - pt has been on clears due to his gastroenteritis.  Pt admits to occasional issues with sensation of food lodging in throat (pointing to pharynx) with increased occurence since his recent fall.  He reports his daughters told him that he has had a TIA previously - although not seen in medical record by this SLP.  Pt denies weight loss PTA nor pulmonary infections.     Assessment / Plan / Recommendation Clinical Impression  Pt with negative cranial nerve exam except decreased right facial movement (volitional vs emotional - mild asymmetry with volitional)  Swallow was mildly slow but clear voice throughout testing.  Pt coughed after swallow of x1/4 boluses of thin and after x1/4 boluses of pudding.  He denied sensation of pharyngeal residuals however.    SLP questions if pt's symptoms of pharyngeal stasis/cough could be referrant to esophageal/GI issues given overall negative CN exam and barium found in distal esophagus during CT of abdomen.    If MD indicates, barium swallow or MBS may provide further clinical information.  SLP educated pt to precautions to maximize airway protection with po intake - using teachback  for assurance of understanding. Advised pt to be mindful of his swallow ability specifically when symptoms occur and to see if PPI improves his symptoms.    to follow up x1 next week for pt education if MBS is not ordered over the weekend.          Aspiration Risk  Mild    Diet Recommendation Regular;Thin liquid  When MD agrees to advance due to gastroenteritis  Liquid Administration via: Cup;Straw Medication Administration: Whole meds with liquid Supervision: Patient able to self feed Compensations: Slow rate;Small sips/bites Postural Changes and/or Swallow Maneuvers: Seated upright 90 degrees;Upright 30-60 min after meal    Other  Recommendations Recommended Consults:  (consider MBS vs esophagram if MD indicates) Oral Care Recommendations: Oral care BID   Follow Up Recommendations       Frequency and Duration min 1 x/week  1 week   Pertinent Vitals/Pain Afebrile, clear     Swallow Study Prior Functional Status   see hhx     General Date of Onset: 04/07/13 HPI: 80 yo male adm to [facility] with n/v and diarrhea - cdif negative diagnosed with gastroenteritis. Pt recently fell and had right nasal bone fractures.  He has h/o peripheral neuropathy.    CT head 3/20 negative for acute event, abdomen of pelvis with contrast showed mild basilar ATX vs pna, gastroreflux not excluded-oral contrast in distal esophagus.  Diet advanced to full liquid diet today - pt has been on clears due to his gastroenteritis.  Pt admits to occasional issues with sensation of  food lodging in throat (pointing to pharynx) with increased occurence since his recent fall.  He reports his daughters told him that he has had a TIA previously - although not seen in medical record by this SLP.  Pt denies weight loss PTA nor pulmonary infections.   Type of Study: Bedside swallow evaluation Diet Prior to this Study: Thin liquids (full liquid) Temperature Spikes Noted: No Respiratory Status: Room air History of Recent  Intubation: No Behavior/Cognition: Alert;Cooperative;Hard of hearing Oral Cavity - Dentition: Adequate natural dentition Self-Feeding Abilities: Able to feed self Patient Positioning: Upright in bed Baseline Vocal Quality: Clear Volitional Cough: Strong Volitional Swallow: Able to elicit    Oral/Motor/Sensory Function Overall Oral Motor/Sensory Function: Appears within functional limits for tasks assessed (except mildly decreased right facial movement - volitional - emotional smile symmetrical)   Ice Chips Ice chips: Not tested   Thin Liquid Thin Liquid: Impaired Presentation: Straw Pharyngeal  Phase Impairments: Cough - Immediate Other Comments: cough x1 of 4 boluses    Nectar Thick Nectar Thick Liquid: Not tested   Honey Thick Honey Thick Liquid: Not tested   Puree Puree: Impaired Presentation: Self Fed;Spoon Pharyngeal Phase Impairments: Multiple swallows Other Comments: multiple swallows inconsistently performed, cough x1 after pudding swallow (after pt speaking) - voice remained clear   Solid   GO    Solid: Within functional limits Presentation: [place], [facility] SLP [phone]

## 2013-04-08 ENCOUNTER — Inpatient Hospital Stay (HOSPITAL_COMMUNITY): Payer: Medicare Other

## 2013-04-08 DIAGNOSIS — N320 Bladder-neck obstruction: Secondary | ICD-10-CM | POA: Diagnosis not present

## 2013-04-08 DIAGNOSIS — I10 Essential (primary) hypertension: Secondary | ICD-10-CM

## 2013-04-08 DIAGNOSIS — Z9181 History of falling: Secondary | ICD-10-CM | POA: Diagnosis not present

## 2013-04-08 LAB — URINALYSIS, ROUTINE W REFLEX MICROSCOPIC
Bilirubin Urine: NEGATIVE
Glucose, UA: NEGATIVE mg/dL
Ketones, ur: NEGATIVE mg/dL
NITRITE: NEGATIVE
PH: 5.5 (ref 5.0–8.0)
Protein, ur: 30 mg/dL — AB
SPECIFIC GRAVITY, URINE: 1.006 (ref 1.005–1.030)
Urobilinogen, UA: 1 mg/dL (ref 0.0–1.0)

## 2013-04-08 LAB — URINE MICROSCOPIC-ADD ON

## 2013-04-08 MED ORDER — HYDRALAZINE HCL 20 MG/ML IJ SOLN
10.0000 mg | Freq: Four times a day (QID) | INTRAMUSCULAR | Status: DC | PRN
  Filled 2013-04-08: qty 0.5

## 2013-04-08 NOTE — Progress Notes (Signed)
SLP  Note  Patient Details Name: [name] [name] MRN: [id number] DOB: 1925-10-23  MBS completed.  Full report to follow.  Recommend to continue current diet of full liquid and advance to mechanical soft as tolerated.           Sharman Crate [place], [place] [facility] 04/08/2013, 11:17 AM

## 2013-04-08 NOTE — Progress Notes (Signed)
Physical Therapy Treatment Patient Details Name: [name] [name] MRN: [id number] DOB: [birth year] Today's Date: 04/08/2013    History of Present Illness Pt is an 80 year old male presenting to [facility] ED with nausea, vomiting, and left lower quadrant abdominal pain with a history of frequent falls (3 in the past week), hypertension, arthritis, peripheral neuropathy, vitamin B12 deficiency    PT Comments    Pt with good progress today with gait.  Continue to recommend SNF for further rehab.  Follow Up Recommendations  SNF     Equipment Recommendations       Recommendations for Other Services       Precautions / Restrictions Precautions Precautions: Fall Restrictions Weight Bearing Restrictions: No    Mobility  Bed Mobility Overal bed mobility: Needs Assistance Bed Mobility: Supine to Sit     Supine to sit: Min guard     General bed mobility comments:  (HOB elevated)  Transfers Overall transfer level: Needs assistance Equipment used: Rolling walker (2 wheeled) Transfers: Sit to/from Stand Sit to Stand: Min assist;+2 safety/equipment         General transfer comment:  (cueing for safety, pt impulsive at times.)  Ambulation/Gait Ambulation/Gait assistance: Min assist;+2 safety/equipment Ambulation Distance (Feet): 40 Feet Assistive device: Rolling walker (2 wheeled) Gait Pattern/deviations: Decreased step length - left;Decreased step length - right;Trunk flexed     General Gait Details: Cues to stay within RW.  Difficulty progressing R LE.  Chair brought behind pt to sit.   Stairs            Wheelchair Mobility    Modified Rankin (Stroke Patients Only)       Balance                                    Cognition Arousal/Alertness: Awake/alert Behavior During Therapy: WFL for tasks assessed/performed Overall Cognitive Status: Within Functional Limits for tasks assessed                      Exercises      General Comments         Pertinent Vitals/Pain No c/o pain    Home Living                      Prior Function            PT Goals (current goals can now be found in the care plan section) Acute Rehab PT Goals PT Goal Formulation: With patient/family Time For Goal Achievement: 04/20/13 Progress towards PT goals: Progressing toward goals    Frequency  Min 3X/week    PT Plan Current plan remains appropriate    End of Session Equipment Utilized During Treatment: Gait belt Activity Tolerance: Patient tolerated treatment well Patient left: in chair;with call bell/phone within reach;with nursing/sitter in room     Time: 1230-1244 PT Time Calculation (min): 14 min  Charges:  $Gait Training: 8-22 mins                    G Codes:      Elisa Sorlie LUBECK 04/08/2013, 1:40 PM

## 2013-04-08 NOTE — Procedures (Signed)
Objective Swallowing Evaluation: Modified Barium Swallowing Study  Patient Details  Name: [name] [name] MRN: [id number] Date of Birth: [birth year]  Today's Date: 04/08/2013 Time: [id number] SLP Time Calculation (min): 60 min  Past Medical History:  Past Medical History  Diagnosis Date  . Arthritis   . Hypertension   . Osteoporosis    Past Surgical History:  Past Surgical History  Procedure Laterality Date  . Spine surgery     HPI:  80 yo male adm to [facility] with n/v and diarrhea - cdif negative diagnosed with gastroenteritis. Pt recently fell and had right nasal bone fractures.  He has h/o peripheral neuropathy.    CT head 3/20 negative for acute event, abdomen of pelvis with contrast showed mild basilar ATX vs pna, gastroreflux not excluded-oral contrast in distal esophagus.  Diet advanced to full liquid diet today - pt has been on clears due to his gastroenteritis.  Pt admits to occasional issues with sensation of food lodging in throat (pointing to pharynx) with increased occurence since his recent fall.  MBS indicated following results of BSE.       Assessment / Plan / Recommendation Clinical Impression  Dysphagia Diagnosis: Moderate oral phase dysphagia;Moderate pharyngeal phase dysphagia;Moderate cervical esophageal phase dysphagia Moderate oral dysphagia marked by lingual weakness and decreased coordination.  Severe delay in oral transit with oral holding with all consistencies.  Sluggish velopharyngeal closure observed.  Moderate sensory motor pharyngeal dysphagia.  Sharp curvature of cervical spine affected complete epiglottic deflection resulting in decreased airway closure.  Decreased TBR resulting in pooling in valleculae with thin liquid barium by cup, puree, and mechanical soft consistency.  Severe residuals in vallecular space s/p swallow of all consistencies requiring multiple dry swallows to clear.  Patient unable to complete dry swallow with attempted chin tuck.  Silent penetration  during swallow with thin liquid barium and puree but able to clear with instructed throat clear.  Aspiration during swallow of thin liquid with use of straw with delayed cough.  Whole barium tablet contained in vallecular space and "hung" above airway prior to complete bolus transit.  Barium tablet lodged in cervical esophagus requiring further puree trials and instructed dry swallows to complete transit.  Patient on full liquid diet at this time due to gastroenteritis.  Recommend to upgrade to mechanical soft as tolerated.  Continue thin liquids (cup sips only) and recommend to administer medication crushed in puree.  Diagnostic treatment completed following study focusing on providing recommendations to patient and nursing.   ST to follow in acute care setting for diet tolerance.  Recommend continued Skilled ST at next level of care for dysphagia management.  Repeat MBS not indicated at this time.  May benefit from GI consult to further assess esophageal functioning.      Treatment Recommendation  Therapy as outlined in treatment plan below    Diet Recommendation Dysphagia 3 (Mechanical Soft);Thin liquid   Liquid Administration via: Cup;No straw Medication Administration: Crushed with puree Supervision: Patient able to self feed;Full supervision/cueing for compensatory strategies Compensations: Slow rate;Small sips/bites;Multiple dry swallows after each bite/sip;Clear throat intermittently;Effortful swallow Postural Changes and/or Swallow Maneuvers: Seated upright 90 degrees;Upright 30-60 min after meal;Out of bed for meals    Other  Recommendations Recommended Consults: Consider GI evaluation Oral Care Recommendations: Oral care BID Other Recommendations: Clarify dietary restrictions   Follow Up Recommendations  Skilled Nursing facility    Frequency and Duration min 2x/week  2 weeks       SLP Swallow Goals  Please refer  to Care Plans    General Date of Onset: 04/07/13 HPI: 80 yo  male adm to [facility] with n/v and diarrhea - cdif negative diagnosed with gastroenteritis. Pt recently fell and had right nasal bone fractures.  He has h/o peripheral neuropathy.    CT head 3/20 negative for acute event, abdomen of pelvis with contrast showed mild basilar ATX vs pna, gastroreflux not excluded-oral contrast in distal esophagus.  Diet advanced to full liquid diet today - pt has been on clears due to his gastroenteritis.  Pt admits to occasional issues with sensation of food lodging in throat (pointing to pharynx) with increased occurence since his recent fall.  He reports his daughters told him that he has had a TIA previously - although not seen  Type of Study: Modified Barium Swallowing Study Reason for Referral: Objectively evaluate swallowing function Diet Prior to this Study: Thin liquids Temperature Spikes Noted: No Respiratory Status: Room air History of Recent Intubation: No Behavior/Cognition: Alert;Cooperative;Pleasant mood Oral Cavity - Dentition: Missing dentition Oral Motor / Sensory Function: Impaired - see Bedside swallow eval Self-Feeding Abilities: Able to feed self Patient Positioning: Upright in bed Baseline Vocal Quality: Clear Volitional Cough: Strong Volitional Swallow: Able to elicit Anatomy: Within functional limits Pharyngeal Secretions: Not observed secondary MBS    Reason for Referral Objectively evaluate swallowing function   Oral Phase Oral Preparation/Oral Phase Oral Phase: Impaired Oral - Thin Oral - Thin Teaspoon: Incomplete tongue to palate contact;Weak lingual manipulation;Lingual pumping;Reduced posterior propulsion;Lingual/palatal residue;Piecemeal swallowing;Decreased velopharyngeal closure;Delayed oral transit Oral - Thin Cup: Incomplete tongue to palate contact;Piecemeal swallowing;Decreased velopharyngeal closure;Reduced posterior propulsion;Delayed oral transit;Weak lingual manipulation;Lingual pumping;Lingual/palatal residue Oral -  Solids Oral - Puree: Lingual/palatal residue;Lingual pumping;Incomplete tongue to palate contact;Piecemeal swallowing;Decreased velopharyngeal closure;Reduced posterior propulsion;Weak lingual manipulation;Delayed oral transit Oral - Mechanical Soft: Lingual pumping;Lingual/palatal residue;Weak lingual manipulation;Impaired mastication;Delayed oral transit;Reduced posterior propulsion;Decreased velopharyngeal closure;Piecemeal swallowing;Incomplete tongue to palate contact   Pharyngeal Phase Pharyngeal Phase Pharyngeal Phase: Impaired Pharyngeal - Thin Pharyngeal - Thin Cup: Reduced pharyngeal peristalsis;Reduced laryngeal elevation;Reduced airway/laryngeal closure;Trace aspiration;Penetration/Aspiration during swallow;Reduced tongue base retraction;Pharyngeal residue - valleculae;Pharyngeal residue - posterior pharnyx;Premature spillage to pyriform sinuses Penetration/Aspiration details (thin cup): Material enters airway, remains ABOVE vocal cords then ejected out Pharyngeal - Thin Straw: Reduced pharyngeal peristalsis;Reduced tongue base retraction;Penetration/Aspiration during swallow;Reduced airway/laryngeal closure;Reduced laryngeal elevation;Reduced anterior laryngeal mobility;Premature spillage to pyriform sinuses;Pharyngeal residue - valleculae;Moderate aspiration;Pharyngeal residue - posterior pharnyx Penetration/Aspiration details (thin straw): Material enters airway, passes BELOW cords and not ejected out despite cough attempt by patient Pharyngeal - Solids Pharyngeal - Puree: Reduced tongue base retraction;Reduced pharyngeal peristalsis;Penetration/Aspiration during swallow;Reduced anterior laryngeal mobility;Reduced laryngeal elevation;Reduced airway/laryngeal closure;Pharyngeal residue - valleculae;Pharyngeal residue - posterior pharnyx Penetration/Aspiration details (puree): Material enters airway, remains ABOVE vocal cords then ejected out Pharyngeal - Mechanical Soft: Reduced  pharyngeal peristalsis;Reduced tongue base retraction;Reduced anterior laryngeal mobility;Reduced laryngeal elevation;Reduced airway/laryngeal closure;Pharyngeal residue - valleculae;Pharyngeal residue - posterior pharnyx Pharyngeal - Pill: Reduced pharyngeal peristalsis;Reduced tongue base retraction;Reduced anterior laryngeal mobility;Reduced laryngeal elevation;Reduced airway/laryngeal closure;Premature spillage to valleculae;Pharyngeal residue - valleculae  Cervical Esophageal Phase    GO   Sharman Crate MS, CCC-SLP [phone] Cervical Esophageal Phase Cervical Esophageal Phase: Impaired Cervical Esophageal Phase - Comment Cervical Esophageal Comment: Whole barium tablet contained in cervicial esophagus          [facility] 04/08/2013, 1:47 PM

## 2013-04-08 NOTE — Progress Notes (Signed)
[facility] PROGRESS NOTE  Rueben Lins [id number] DOB: Feb 06, 1925 DOA: 04/05/2013 PCP: [name], MD  Assessment/Plan: Gastroenteritis, acute  -Secondary to rotavirus infection. Stool for C. difficile negative. Continue IV hydration with normal saline at 75 cc/ hr -CT scan of the abdomen and pelvis negative for obstruction or colitis. Stool is enlarged prostate with distended bladder suggestive of BPH or prostate malignancy. Also for finding of gastroesophageal reflux. -advance diet to full liquids -Supportive care with pain medications and antiemetics  Active Problems:  Dehydration  Improving. Continue gentle IV hydration   Frequent falls with weakness  Patient seen by physical therapy and recommended for skilled nursing facility as he is quite weak and unsteady. Also he lives alone at home.plan for d/c to SNF on 3/30 Patient has Peripheral neuropathy as well  Hypertension  Resumed all blood pressure medications. Blood pressure now elevated. Will start when necessary hydralazine.  Bladder outflow obstruction Patient gives history oral hesitancy and increased urinary frequency at home. Enlarged prostate and urinary bladder on CT scan. Given urinary retention and on bladder scan a Foley catheter was placed on admission. I have started him on Flomax. PSA normal. CT scan also shows tiny scattered densities throughout the spine and pelvis which cannot rule out blastic metastases disease. He will need bone scan as outpatient. -Patient will be called bilateral to neurology for outpatient evaluation for his BPH and voiding trial. -Noted for hematuria in the back today. Check UA   History of coronary artery disease Continue metoprolol and statin. Currently asymptomatic. He'll need outpatient cardiology followup. Previously seen by [facility] heart and vascular.  GERD and dysphagia Daughter reports episodes of choking and coughing while eating. Started on PPI. Speech and  swallow eval appreciated. Modified barium swallow done this morning. Will followup with results   Recent fall with a right nasal bone fracture Stable. Sutures  removed   Diet: Full liquids DVT prophylaxis: sq lovenox      Code Status: Full code Family Communication: None at bedside today Disposition Plan: Skilled nursing facility ([place]) on 3/30   Consultants:  none  Procedures:  None  Antibiotics:  None  HPI/Subjective: Patient seen and examined this morning. Informs feeling better.  Objective: Filed Vitals:   04/08/13 0731  BP: 164/82  Pulse: 70  Temp: 98.3 F (36.8 C)  Resp: 16    Intake/Output Summary (Last 24 hours) at 04/08/13 1144 Last data filed at 04/08/13 0700  Gross per 24 hour  Intake   2745 ml  Output   4575 ml  Net  -1830 ml   Filed Weights   04/05/13 1014  Weight: 87.1 kg (192 lb 0.3 oz)    Exam: Gen.: Patient is an elderly male in no acute distress.  HEENT:swelling of nose with bruising from recent fall and right nasal bone fracture. Bruising over infraorbital area.. no pallor, no icterus, dry oral mucosa,  Cardiovascular: RRR, S1 normal, S2 normal, no MRG  Chest: CTAB, no wheezes, rales, or rhonchi  Abdominal: Soft. non-distended, bowel sounds are normal, nontender Ext: warm, no edema Neurological: A&O x3, non focal  Data Reviewed: Basic Metabolic Panel:  Recent Labs Lab 04/05/13 0300 04/06/13 0453  NA 140 138  K 3.7 3.6*  CL 104 108  CO2 20 19  GLUCOSE 128* 84  BUN 30* 15  CREATININE 1.14 0.91  CALCIUM 9.3 8.1*   Liver Function Tests:  Recent Labs Lab 04/05/13 0300  AST 22  ALT 14  ALKPHOS 71  BILITOT 0.4  PROT 7.0  ALBUMIN 3.5   No results found for this basename: LIPASE, AMYLASE,  in the last 168 hours No results found for this basename: AMMONIA,  in the last 168 hours CBC:  Recent Labs Lab 04/05/13 0300  WBC 6.2  NEUTROABS 4.6  HGB 16.2  HCT 46.5  MCV 92.4  PLT 175   Cardiac  Enzymes: No results found for this basename: CKTOTAL, CKMB, CKMBINDEX, TROPONINI,  in the last 168 hours BNP (last 3 results) No results found for this basename: PROBNP,  in the last 8760 hours CBG: No results found for this basename: GLUCAP,  in the last 168 hours  Recent Results (from the past 240 hour(s))  URINE CULTURE     Status: None   Collection Time    03/31/13  5:49 PM      Result Value Ref Range Status   Specimen Description URINE, CLEAN CATCH   Final   Special Requests NONE   Final   Culture  Setup Time     Final   Value: 04/01/2013 01:12     Performed at [place]     Final   Value: NO GROWTH     Performed at [facility]   Culture     Final   Value: NO GROWTH     Performed at [facility]   Report Status 04/02/2013 FINAL   Final  CLOSTRIDIUM DIFFICILE BY PCR     Status: None   Collection Time    04/05/13  8:12 AM      Result Value Ref Range Status   C difficile by pcr NEGATIVE  NEGATIVE Final   Comment: Performed at [facility]     Studies: No results found.  Scheduled Meds: . amLODipine  10 mg Oral QHS  . aspirin  325 mg Oral Daily  . atorvastatin  20 mg Oral q1800  . calcium-vitamin D  2 tablet Oral Q breakfast  . enoxaparin (LOVENOX) injection  40 mg Subcutaneous Q24H  . lisinopril  20 mg Oral BID   And  . hydrochlorothiazide  12.5 mg Oral BID  . metoprolol  100 mg Oral BID  . omega-3 acid ethyl esters  2 g Oral Daily  . pantoprazole  40 mg Oral Daily  . tamsulosin  0.4 mg Oral QPC supper   Continuous Infusions: . sodium chloride 75 mL/hr at 04/07/13 2216      Time spent: 25 minutes    Everett Ehrler, [place] [facility] Pager [phone] If 7PM-7AM, please contact night-coverage at [email or web address], password [facility] 04/08/2013, 11:44 AM  LOS: 3 days

## 2013-04-09 ENCOUNTER — Other Ambulatory Visit: Payer: Self-pay

## 2013-04-09 DIAGNOSIS — E860 Dehydration: Secondary | ICD-10-CM | POA: Diagnosis not present

## 2013-04-09 DIAGNOSIS — Z9181 History of falling: Secondary | ICD-10-CM | POA: Diagnosis not present

## 2013-04-09 DIAGNOSIS — I10 Essential (primary) hypertension: Secondary | ICD-10-CM | POA: Diagnosis not present

## 2013-04-09 DIAGNOSIS — R319 Hematuria, unspecified: Secondary | ICD-10-CM | POA: Diagnosis not present

## 2013-04-09 DIAGNOSIS — R1314 Dysphagia, pharyngoesophageal phase: Secondary | ICD-10-CM | POA: Diagnosis not present

## 2013-04-09 DIAGNOSIS — N320 Bladder-neck obstruction: Secondary | ICD-10-CM | POA: Diagnosis not present

## 2013-04-09 DIAGNOSIS — A080 Rotaviral enteritis: Secondary | ICD-10-CM | POA: Diagnosis not present

## 2013-04-09 LAB — T4, FREE: FREE T4: 1.33 ng/dL (ref 0.80–1.80)

## 2013-04-09 MED ORDER — HYDRALAZINE HCL 20 MG/ML IJ SOLN
10.0000 mg | INTRAMUSCULAR | Status: DC | PRN
  Filled 2013-04-09: qty 0.5

## 2013-04-09 NOTE — Progress Notes (Signed)
[facility] PROGRESS NOTE  [name] [name] [id number] DOB: [birth year] DOA: 04/05/2013 PCP: [name], MD  Assessment/Plan: Gastroenteritis, acute  -Secondary to rotavirus infection. Stool for C. difficile negative. Continue IV hydration with normal saline at 75 cc/ hr -CT scan of the abdomen and pelvis negative for obstruction or colitis.  enlarged prostate with distended bladder suggestive of BPH or prostate malignancy. Also for finding of gastroesophageal reflux. Now resolved  -advanced diet to full liquids   Active Problems:  Dehydration  Resolved.  D/c fluids  Frequent falls with weakness  Patient seen by physical therapy and recommended for skilled nursing facility as he is quite weak and unsteady. Also he lives alone at home.plan for d/c to SNF on 3/30 Patient has Peripheral neuropathy as well  Hypertension  Resumed all blood pressure medications. Blood pressure  elevated. Added when  necessary hydralazine.  Bladder outflow obstruction Patient gives history oral hesitancy and increased urinary frequency at home. Enlarged prostate and urinary bladder on CT scan. Given urinary retention and on bladder scan a Foley catheter was placed on admission. I have started him on Flomax. PSA normal. CT scan also shows tiny scattered densities throughout the spine and pelvis which cannot rule out blastic metastases disease. He will need bone scan as outpatient. -Patient will be called bilateral to neurology for outpatient evaluation for his BPH and voiding trial.  GERD and dysphagia Daughter reports episodes of choking and coughing while eating. Started on PPI. Speech and swallow eval appreciated. Modified barium swallow done shows moderate oral, pharyngeal and esophageal dysphagia. Discussed with GI Dr Watt Climes who recommends obtaining regular barium swallow to r/o any strictures. Will followup with results   History of coronary artery disease Continue metoprolol and statin. Currently  asymptomatic. He'll need outpatient cardiology followup. Previously seen by [facility] heart and vascular.   ? Afib  check 12 lead EKG. Low TSH noted. Check free T4.    Recent fall with a right nasal bone fracture Stable. Sutures  removed   Diet: Full liquids DVT prophylaxis: sq lovenox      Code Status: Full code Family Communication: None at bedside today. Will update daughter Disposition Plan: Skilled nursing facility ([facility]) on 3/30   Consultants:  none  Procedures:  None  Antibiotics:  None  HPI/Subjective: Patient seen and examined this morning. Informs feeling better. no further diarrhea or abd pain Objective: Filed Vitals:   04/09/13 1012  BP: 175/84  Pulse: 76  Temp:   Resp:     Intake/Output Summary (Last 24 hours) at 04/09/13 1033 Last data filed at 04/09/13 0749  Gross per 24 hour  Intake   1860 ml  Output   3250 ml  Net  -1390 ml   Filed Weights   04/05/13 1014  Weight: 87.1 kg (192 lb 0.3 oz)    Exam: Gen.: Patient is an elderly male in no acute distress.  HEENT:swelling of nose with bruising from recent fall and right nasal bone fracture. Bruising over infraorbital area.. no pallor, no icterus, dry oral mucosa,  Cardiovascular: RRR, S1 & S2 irregular, no MRG  Chest: CTAB, no wheezes, rales, or rhonchi  Abdominal: Soft. non-distended, bowel sounds are normal, nontender Ext: warm, no edema Neurological: A&O x3, non focal  Data Reviewed: Basic Metabolic Panel:  Recent Labs Lab 04/05/13 0300 04/06/13 0453  NA 140 138  K 3.7 3.6*  CL 104 108  CO2 20 19  GLUCOSE 128* 84  BUN 30* 15  CREATININE 1.14 0.91  CALCIUM  9.3 8.1*   Liver Function Tests:  Recent Labs Lab 04/05/13 0300  AST 22  ALT 14  ALKPHOS 71  BILITOT 0.4  PROT 7.0  ALBUMIN 3.5   No results found for this basename: LIPASE, AMYLASE,  in the last 168 hours No results found for this basename: AMMONIA,  in the last 168 hours CBC:  Recent Labs Lab  04/05/13 0300  WBC 6.2  NEUTROABS 4.6  HGB 16.2  HCT 46.5  MCV 92.4  PLT 175   Cardiac Enzymes: No results found for this basename: CKTOTAL, CKMB, CKMBINDEX, TROPONINI,  in the last 168 hours BNP (last 3 results) No results found for this basename: PROBNP,  in the last 8760 hours CBG: No results found for this basename: GLUCAP,  in the last 168 hours  Recent Results (from the past 240 hour(s))  URINE CULTURE     Status: None   Collection Time    03/31/13  5:49 PM      Result Value Ref Range Status   Specimen Description URINE, CLEAN CATCH   Final   Special Requests NONE   Final   Culture  Setup Time     Final   Value: 04/01/2013 01:12     Performed at [place]     Final   Value: NO GROWTH     Performed at [facility]   Culture     Final   Value: NO GROWTH     Performed at [facility]   Report Status 04/02/2013 FINAL   Final  CLOSTRIDIUM DIFFICILE BY PCR     Status: None   Collection Time    04/05/13  8:12 AM      Result Value Ref Range Status   C difficile by pcr NEGATIVE  NEGATIVE Final   Comment: Performed at [facility]     Studies: Dg Swallowing Func-speech Pathology  04/08/2013   Marcille Buffy, [place]     04/08/2013  2:09 PM Objective Swallowing Evaluation: Modified Barium Swallowing Study   Patient Details  Name: [name] [name] MRN: [id number] Date of Birth: Jul 07, 1925  Today's Date: 04/08/2013 Time: [id number] SLP Time Calculation (min): 60 min  Past Medical History:  Past Medical History  Diagnosis Date  . Arthritis   . Hypertension   . Osteoporosis    Past Surgical History:  Past Surgical History  Procedure Laterality Date  . Spine surgery     HPI:  80 yo male adm to [facility] with n/v and diarrhea - cdif negative  diagnosed with gastroenteritis. Pt recently fell and had right  nasal bone fractures.  He has h/o peripheral neuropathy.    CT  head 3/20 negative for acute event, abdomen of pelvis with  contrast showed mild  basilar ATX vs pna, gastroreflux not  excluded-oral contrast in distal esophagus.  Diet advanced to  full liquid diet today - pt has been on clears due to his  gastroenteritis.  Pt admits to occasional issues with sensation  of food lodging in throat (pointing to pharynx) with increased  occurence since his recent fall.  MBS indicated following results  of BSE.       Assessment / Plan / Recommendation Clinical Impression  Dysphagia Diagnosis: Moderate oral phase dysphagia;Moderate  pharyngeal phase dysphagia;Moderate cervical esophageal phase  dysphagia Moderate oral dysphagia marked by lingual weakness and decreased  coordination.  Severe delay in oral transit with oral holding  with all consistencies.  Sluggish velopharyngeal  closure  observed.  Moderate sensory motor pharyngeal dysphagia.  Sharp  curvature of cervical spine affected complete epiglottic  deflection resulting in decreased airway closure.  Decreased TBR  resulting in pooling in valleculae with thin liquid barium by  cup, puree, and mechanical soft consistency.  Severe residuals in  vallecular space s/p swallow of all consistencies requiring  multiple dry swallows to clear.  Patient unable to complete dry  swallow with attempted chin tuck.  Silent penetration during  swallow with thin liquid barium and puree but able to clear with  instructed throat clear.  Aspiration during swallow of thin  liquid with use of straw with delayed cough.  Whole barium tablet  contained in vallecular space and "hung" above airway prior to  complete bolus transit.  Barium tablet lodged in cervical  esophagus requiring further puree trials and instructed dry  swallows to complete transit.  Patient on full liquid diet at this time due to gastroenteritis.   Recommend to upgrade to mechanical soft as tolerated.  Continue  thin liquids (cup sips only) and recommend to administer  medication crushed in puree.  Diagnostic treatment completed  following study focusing on  providing recommendations to patient  and nursing.   ST to follow in acute care setting for diet tolerance.  Recommend  continued Skilled ST at next level of care for dysphagia  management.  Repeat MBS not indicated at this time.  May benefit  from GI consult to further assess esophageal functioning.      Treatment Recommendation  Therapy as outlined in treatment plan below    Diet Recommendation Dysphagia 3 (Mechanical Soft);Thin liquid   Liquid Administration via: Cup;No straw Medication Administration: Crushed with puree Supervision: Patient able to self feed;Full supervision/cueing  for compensatory strategies Compensations: Slow rate;Small sips/bites;Multiple dry swallows  after each bite/sip;Clear throat intermittently;Effortful swallow Postural Changes and/or Swallow Maneuvers: Seated upright 90  degrees;Upright 30-60 min after meal;Out of bed for meals    Other  Recommendations Recommended Consults: Consider GI  evaluation Oral Care Recommendations: Oral care BID Other Recommendations: Clarify dietary restrictions   Follow Up Recommendations  Skilled Nursing facility    Frequency and Duration min 2x/week  2 weeks       SLP Swallow Goals  Please refer to Care Plans    General Date of Onset: 04/07/13 HPI: 80 yo male adm to [facility] with n/v and diarrhea - cdif negative  diagnosed with gastroenteritis. Pt recently fell and had right  nasal bone fractures.  He has h/o peripheral neuropathy.    CT  head 3/20 negative for acute event, abdomen of pelvis with  contrast showed mild basilar ATX vs pna, gastroreflux not  excluded-oral contrast in distal esophagus.  Diet advanced to  full liquid diet today - pt has been on clears due to his  gastroenteritis.  Pt admits to occasional issues with sensation  of food lodging in throat (pointing to pharynx) with increased  occurence since his recent fall.  He reports his daughters told  him that he has had a TIA previously - although not seen  Type of Study: Modified Barium  Swallowing Study Reason for Referral: Objectively evaluate swallowing function Diet Prior to this Study: Thin liquids Temperature Spikes Noted: No Respiratory Status: Room air History of Recent Intubation: No Behavior/Cognition: Alert;Cooperative;Pleasant mood Oral Cavity - Dentition: Missing dentition Oral Motor / Sensory Function: Impaired - see Bedside swallow  eval Self-Feeding Abilities: Able to feed self Patient Positioning: Upright in bed Baseline Vocal  Quality: Clear Volitional Cough: Strong Volitional Swallow: Able to elicit Anatomy: Within functional limits Pharyngeal Secretions: Not observed secondary MBS    Reason for Referral Objectively evaluate swallowing function   Oral Phase Oral Preparation/Oral Phase Oral Phase: Impaired Oral - Thin Oral - Thin Teaspoon: Incomplete tongue to palate contact;Weak  lingual manipulation;Lingual pumping;Reduced posterior  propulsion;Lingual/palatal residue;Piecemeal swallowing;Decreased  velopharyngeal closure;Delayed oral transit Oral - Thin Cup: Incomplete tongue to palate contact;Piecemeal  swallowing;Decreased velopharyngeal closure;Reduced posterior  propulsion;Delayed oral transit;Weak lingual manipulation;Lingual  pumping;Lingual/palatal residue Oral - Solids Oral - Puree: Lingual/palatal residue;Lingual pumping;Incomplete  tongue to palate contact;Piecemeal swallowing;Decreased  velopharyngeal closure;Reduced posterior propulsion;Weak lingual  manipulation;Delayed oral transit Oral - Mechanical Soft: Lingual pumping;Lingual/palatal  residue;Weak lingual manipulation;Impaired mastication;Delayed  oral transit;Reduced posterior propulsion;Decreased  velopharyngeal closure;Piecemeal swallowing;Incomplete tongue to  palate contact   Pharyngeal Phase Pharyngeal Phase Pharyngeal Phase: Impaired Pharyngeal - Thin Pharyngeal - Thin Cup: Reduced pharyngeal peristalsis;Reduced  laryngeal elevation;Reduced airway/laryngeal closure;Trace  aspiration;Penetration/Aspiration  during swallow;Reduced tongue  base retraction;Pharyngeal residue - valleculae;Pharyngeal  residue - posterior pharnyx;Premature spillage to pyriform  sinuses Penetration/Aspiration details (thin cup): Material enters  airway, remains ABOVE vocal cords then ejected out Pharyngeal - Thin Straw: Reduced pharyngeal peristalsis;Reduced  tongue base retraction;Penetration/Aspiration during  swallow;Reduced airway/laryngeal closure;Reduced laryngeal  elevation;Reduced anterior laryngeal mobility;Premature spillage  to pyriform sinuses;Pharyngeal residue - valleculae;Moderate  aspiration;Pharyngeal residue - posterior pharnyx Penetration/Aspiration details (thin straw): Material enters  airway, passes BELOW cords and not ejected out despite cough  attempt by patient Pharyngeal - Solids Pharyngeal - Puree: Reduced tongue base retraction;Reduced  pharyngeal peristalsis;Penetration/Aspiration during  swallow;Reduced anterior laryngeal mobility;Reduced laryngeal  elevation;Reduced airway/laryngeal closure;Pharyngeal residue -  valleculae;Pharyngeal residue - posterior pharnyx Penetration/Aspiration details (puree): Material enters airway,  remains ABOVE vocal cords then ejected out Pharyngeal - Mechanical Soft: Reduced pharyngeal  peristalsis;Reduced tongue base retraction;Reduced anterior  laryngeal mobility;Reduced laryngeal elevation;Reduced  airway/laryngeal closure;Pharyngeal residue -  valleculae;Pharyngeal residue - posterior pharnyx Pharyngeal - Pill: Reduced pharyngeal peristalsis;Reduced tongue  base retraction;Reduced anterior laryngeal mobility;Reduced  laryngeal elevation;Reduced airway/laryngeal closure;Premature  spillage to valleculae;Pharyngeal residue - valleculae  Cervical Esophageal Phase    GO   Sharman Crate MS, CCC-SLP [phone] Cervical Esophageal Phase Cervical Esophageal Phase: Impaired Cervical Esophageal Phase - Comment Cervical Esophageal Comment: Whole barium tablet contained in  cervicial esophagus           [facility] 04/08/2013, 1:47 PM     Scheduled Meds: . amLODipine  10 mg Oral QHS  . aspirin  325 mg Oral Daily  . atorvastatin  20 mg Oral q1800  . calcium-vitamin D  2 tablet Oral Q breakfast  . enoxaparin (LOVENOX) injection  40 mg Subcutaneous Q24H  . lisinopril  20 mg Oral BID   And  . hydrochlorothiazide  12.5 mg Oral BID  . metoprolol  100 mg Oral BID  . omega-3 acid ethyl esters  2 g Oral Daily  . pantoprazole  40 mg Oral Daily  . tamsulosin  0.4 mg Oral QPC supper   Continuous Infusions: . sodium chloride 75 mL/hr at 04/08/13 2316      Time spent: 25 minutes    [name] [name], [facility] Pager [phone] If 7PM-7AM, please contact night-coverage at [email or web address], password [facility] 04/09/2013, 10:33 AM  LOS: 4 days

## 2013-04-10 ENCOUNTER — Inpatient Hospital Stay (HOSPITAL_COMMUNITY): Payer: Medicare Other

## 2013-04-10 DIAGNOSIS — R5383 Other fatigue: Secondary | ICD-10-CM | POA: Diagnosis not present

## 2013-04-10 DIAGNOSIS — R1314 Dysphagia, pharyngoesophageal phase: Secondary | ICD-10-CM

## 2013-04-10 DIAGNOSIS — N320 Bladder-neck obstruction: Secondary | ICD-10-CM | POA: Diagnosis not present

## 2013-04-10 DIAGNOSIS — G589 Mononeuropathy, unspecified: Secondary | ICD-10-CM | POA: Diagnosis not present

## 2013-04-10 DIAGNOSIS — E860 Dehydration: Secondary | ICD-10-CM | POA: Diagnosis not present

## 2013-04-10 DIAGNOSIS — K219 Gastro-esophageal reflux disease without esophagitis: Secondary | ICD-10-CM | POA: Diagnosis not present

## 2013-04-10 DIAGNOSIS — D518 Other vitamin B12 deficiency anemias: Secondary | ICD-10-CM | POA: Diagnosis not present

## 2013-04-10 DIAGNOSIS — R1312 Dysphagia, oropharyngeal phase: Secondary | ICD-10-CM | POA: Diagnosis not present

## 2013-04-10 DIAGNOSIS — R1310 Dysphagia, unspecified: Secondary | ICD-10-CM | POA: Diagnosis not present

## 2013-04-10 DIAGNOSIS — K5289 Other specified noninfective gastroenteritis and colitis: Secondary | ICD-10-CM | POA: Diagnosis not present

## 2013-04-10 DIAGNOSIS — Z9181 History of falling: Secondary | ICD-10-CM | POA: Diagnosis not present

## 2013-04-10 DIAGNOSIS — R531 Weakness: Secondary | ICD-10-CM

## 2013-04-10 DIAGNOSIS — I2510 Atherosclerotic heart disease of native coronary artery without angina pectoris: Secondary | ICD-10-CM | POA: Diagnosis not present

## 2013-04-10 DIAGNOSIS — I10 Essential (primary) hypertension: Secondary | ICD-10-CM | POA: Diagnosis not present

## 2013-04-10 DIAGNOSIS — M1990 Unspecified osteoarthritis, unspecified site: Secondary | ICD-10-CM | POA: Diagnosis not present

## 2013-04-10 DIAGNOSIS — R262 Difficulty in walking, not elsewhere classified: Secondary | ICD-10-CM | POA: Diagnosis not present

## 2013-04-10 DIAGNOSIS — N401 Enlarged prostate with lower urinary tract symptoms: Secondary | ICD-10-CM | POA: Diagnosis not present

## 2013-04-10 DIAGNOSIS — R296 Repeated falls: Secondary | ICD-10-CM

## 2013-04-10 DIAGNOSIS — K2990 Gastroduodenitis, unspecified, without bleeding: Secondary | ICD-10-CM | POA: Diagnosis not present

## 2013-04-10 DIAGNOSIS — N138 Other obstructive and reflux uropathy: Secondary | ICD-10-CM | POA: Diagnosis not present

## 2013-04-10 DIAGNOSIS — K2100 Gastro-esophageal reflux disease with esophagitis, without bleeding: Secondary | ICD-10-CM | POA: Diagnosis not present

## 2013-04-10 DIAGNOSIS — A080 Rotaviral enteritis: Secondary | ICD-10-CM | POA: Diagnosis not present

## 2013-04-10 DIAGNOSIS — I499 Cardiac arrhythmia, unspecified: Secondary | ICD-10-CM | POA: Diagnosis not present

## 2013-04-10 DIAGNOSIS — M6281 Muscle weakness (generalized): Secondary | ICD-10-CM | POA: Diagnosis not present

## 2013-04-10 DIAGNOSIS — E0590 Thyrotoxicosis, unspecified without thyrotoxic crisis or storm: Secondary | ICD-10-CM | POA: Diagnosis not present

## 2013-04-10 DIAGNOSIS — M818 Other osteoporosis without current pathological fracture: Secondary | ICD-10-CM | POA: Diagnosis not present

## 2013-04-10 DIAGNOSIS — K2970 Gastritis, unspecified, without bleeding: Secondary | ICD-10-CM | POA: Diagnosis not present

## 2013-04-10 DIAGNOSIS — R5381 Other malaise: Secondary | ICD-10-CM | POA: Diagnosis not present

## 2013-04-10 DIAGNOSIS — E785 Hyperlipidemia, unspecified: Secondary | ICD-10-CM | POA: Diagnosis not present

## 2013-04-10 DIAGNOSIS — C61 Malignant neoplasm of prostate: Secondary | ICD-10-CM | POA: Diagnosis not present

## 2013-04-10 DIAGNOSIS — R339 Retention of urine, unspecified: Secondary | ICD-10-CM | POA: Diagnosis not present

## 2013-04-10 DIAGNOSIS — E869 Volume depletion, unspecified: Secondary | ICD-10-CM | POA: Diagnosis not present

## 2013-04-10 LAB — T3, FREE: T3 FREE: 2.5 pg/mL (ref 2.3–4.2)

## 2013-04-10 MED ORDER — PANTOPRAZOLE SODIUM 40 MG PO TBEC: 40.0000 mg | DELAYED_RELEASE_TABLET | Freq: Every day | ORAL | Status: DC

## 2013-04-10 MED ORDER — TAMSULOSIN HCL 0.4 MG PO CAPS: 0.4000 mg | ORAL_CAPSULE | Freq: Every day | ORAL | Status: DC

## 2013-04-10 MED ORDER — METOCLOPRAMIDE HCL 5 MG PO TABS: 5.0000 mg | ORAL_TABLET | Freq: Three times a day (TID) | ORAL | Status: DC

## 2013-04-10 NOTE — Plan of Care (Signed)
Problem: Discharge Progression Outcomes Goal: Discharge plan in place and appropriate Outcome: Completed/Met Date Met:  04/10/13 [place] place

## 2013-04-10 NOTE — Progress Notes (Signed)
Physical Therapy Treatment Patient Details Name: [name] [name] MRN: [id number] DOB: 1925-10-25 Today's Date: May 01, 2013    History of Present Illness Pt is an 80 year old male presenting to [facility] ED with nausea, vomiting, and left lower quadrant abdominal pain with a history of frequent falls (3 in the past week), hypertension, arthritis, peripheral neuropathy, vitamin B12 deficiency    PT Comments    Pt progressing slowly, requiring more assist overall today  Follow Up Recommendations  SNF     Equipment Recommendations       Recommendations for Other Services       Precautions / Restrictions Precautions Precautions: Fall    Mobility  Bed Mobility Overal bed mobility: Needs Assistance Bed Mobility: Supine to Sit     Supine to sit: Min assist Sit to supine: Min guard   General bed mobility comments: verbal cues for safety, sequence and positioning; took increased time to perform bed mobility  Transfers Overall transfer level: Needs assistance Equipment used: Rolling [name] (2 wheeled) Transfers: Sit to/from Stand Sit to Stand: Min assist;+2 physical assistance Stand pivot transfers: Min assist;+2 physical assistance;+2 safety/equipment       General transfer comment: verbal and tactile cues for hand placement  Ambulation/Gait Ambulation/Gait assistance: Min assist Ambulation Distance (Feet): 20 Feet Assistive device: Rolling [name] (2 wheeled) Gait Pattern/deviations: Step-to pattern;Shuffle     General Gait Details: Cues to stay within RW.  Difficulty progressing R LE.  Chair brought behind pt to sit. pt with RLE in ext rotation, much difficulty stepping into [name], anterior wt shift    Stairs            Wheelchair Mobility    Modified Rankin (Stroke Patients Only)       Balance             Standing balance-Leahy Scale: Zero                      Cognition Arousal/Alertness: Awake/alert Behavior During Therapy: WFL for tasks  assessed/performed Overall Cognitive Status: Within Functional Limits for tasks assessed                      Exercises      General Comments        Pertinent Vitals/Pain Denies pain    Home Living                      Prior Function            PT Goals (current goals can now be found in the care plan section) Acute Rehab PT Goals Time For Goal Achievement: 04/20/13 Potential to Achieve Goals: Fair Progress towards PT goals: Progressing toward goals    Frequency  Min 3X/week    PT Plan Current plan remains appropriate    End of Session Equipment Utilized During Treatment: Gait belt Activity Tolerance: Patient tolerated treatment well;Patient limited by fatigue Patient left: in bed;with call bell/phone within reach     Time: 1359-1417 PT Time Calculation (min): 18 min  Charges:  $Gait Training: 8-22 mins                    G Codes:      [name] [name] May 01, 2013, 2:26 PM

## 2013-04-10 NOTE — Progress Notes (Signed)
Clinical Social Work Department CLINICAL SOCIAL WORK PLACEMENT NOTE 04/10/2013  Patient:  [name] [name], [name] [name]  Account Number:  [id number] [place] date:  04/05/2013  Clinical Social Worker:  Caren Hazy, LCSW  Date/time:  04/10/2013 12:00 M  Clinical Social Work is seeking post-discharge placement for this patient at the following level of care:   [place]   (*CSW will update this form in Epic as items are completed)   04/10/2013  Patient/family provided with [facility] Department of Clinical Social Work's list of facilities offering this level of care within the geographic area requested by the patient (or if unable, by the patient's family).  04/10/2013  Patient/family informed of their freedom to choose among providers that offer the needed level of care, that participate in Medicare, Medicaid or managed care program needed by the patient, have an available bed and are willing to accept the patient.  04/10/2013  Patient/family informed of [facility]' ownership interest in [facility], as well as of the fact that they are under no obligation to receive care at this facility.  PASARR submitted to EDS on 04/10/2013 PASARR number received from EDS on 04/10/2013  FL2 transmitted to all facilities in geographic area requested by pt/family on  04/10/2013 FL2 transmitted to all facilities within larger geographic area on 04/10/2013  Patient informed that his/her managed care company has contracts with or will negotiate with  certain facilities, including the following:     Patient/family informed of bed offers received:  04/10/2013 Patient chooses bed at [facility] Physician recommends and patient chooses bed at    Patient to be transferred to [facility] on  04/10/2013 Patient to be transferred to facility by [facility]  The following physician request were entered in Epic:   Additional Comments:

## 2013-04-10 NOTE — Discharge Summary (Signed)
Physician Discharge Summary  Ender Seccombe [id number] DOB: June 20, 1925 DOA: 04/05/2013  PCP: [name], MD  Admit date: 04/05/2013 Discharge date: 04/10/2013  Time spent: 40 minutes  Recommendations for Outpatient Follow-up:  D/c to SNF . Follow up barium swallow and US thyroid result as outpt. Refer to outpt GI if dysphagia continues.  Patient needs a bone scan and urology evaluation for findings of enlarged prostate and CT abnormality concerning for prostate ca.   Discharge Diagnoses:  Principal Problem: Acute viral gastroenteritis ( due to rotavirus)  Active Problems:   Dehydration   Hypertension   Arthritis   Peripheral neuropathy   GERD (gastroesophageal reflux disease)   Bladder outflow obstruction   Dysphagia, pharyngoesophageal phase   Frequent falls   Weakness   Hyperthyroidism, subclinical   Discharge Condition: fair  Diet recommendation: dysphagia level 3 mechanical soft.  Filed Weights   04/05/13 1014  Weight: 87.1 kg (192 lb 0.3 oz)    History of present illness:    Hospital Course:  Gastroenteritis, acute  -Secondary to rotavirus infection. Stool for C. difficile negative.  Continue IV hydration with normal saline at 75 cc/ hr  -CT scan of the abdomen and pelvis negative for obstruction or colitis. enlarged prostate with distended bladder suggestive of BPH or prostate malignancy. Also for finding of gastroesophageal reflux.  Now resolved  -advance diet to dys level 3   Active Problems:   Dehydration  Resolved.   Frequent falls with weakness  Patient seen by physical therapy and recommended for skilled nursing facility as he is quite weak and unsteady. Also he lives alone at home. Seen by PT and recommend SNF. Patient has Peripheral neuropathy as well .  Hypertension  Resumed all blood pressure medications. Blood pressure elevated. Receiving hydralazine as necessary here. please titrate BP meds as outpatient.   Bladder outflow obstruction   Patient gives history oral hesitancy and increased urinary frequency at home. Enlarged prostate and urinary bladder on CT scan. Given urinary retention and on bladder scan a Foley catheter was placed on admission. I have started him on Flomax. PSA normal.  CT scan also shows tiny scattered densities throughout the spine and pelvis which cannot rule out blastic metastases disease. He will need bone scan as outpatient.  -[facility] evaluation for his BPH and voiding trial.   GERD and dysphagia  Daughter reports episodes of choking and coughing while eating.  Started on PPI. Speech and swallow eval appreciated. Modified barium swallow done shows moderate oral, pharyngeal and esophageal dysphagia. Discussed with GI Dr Watt Climes who recommends obtaining regular barium swallow to r/o any strictures.  Barium swallow done today. Pending results and follow up as outpt Will discharge on PPI and reglan..  subclinical hyperthyroidism Noted for TSH of 0.2. Free t4 and t3 normal. Follow up thyroid US. Needs follow up TSH as outpt  History of coronary artery disease  Continue metoprolol and statin. Currently asymptomatic. He'll need outpatient cardiology followup. Previously seen by [facility] vascular. EKG shows PVCs.   Hematuria Noted on UA on 3/28. H&H stable. follow up with urology as outpt  Recent fall with a right nasal bone fracture  Stable. Sutures removed.  Diet: dysphagia level 3 mechanical soft    Code Status: Full code  Family Communication: None at bedside today. Will update daughter   Disposition Plan: Skilled nursing facility ([facility])   Consultants:  None   Procedures:  None Antibiotics:  None  Discharge Exam: Filed Vitals:   04/10/13 1124  BP: 157/72  Pulse: 62  Temp:   Resp:     Gen.: Patient is an elderly male in no acute distress.  HEENT:swelling of nose with bruising from recent fall and  right nasal bone fracture. Bruising over infraorbital area.. no pallor, no icterus, dry oral mucosa,  Cardiovascular: RRR, normal S1 & S2, no MRG  Chest: CTAB, no wheezes, rales, or rhonchi  Abdominal: Soft. non-distended, bowel sounds are normal, nontender Ext: warm, no edema Neurological: A&O x3, non focal   Discharge Instructions     Medication List    STOP taking these medications       naproxen sodium 220 MG tablet  Commonly known as:  ANAPROX      TAKE these medications       amLODipine 10 MG tablet  Commonly known as:  NORVASC  Take 5 mg by mouth at bedtime.     aspirin 325 MG tablet  Take 325 mg by mouth daily.     calcium-vitamin D 500-200 MG-UNIT per tablet  Commonly known as:  OSCAL WITH D  Take 2 tablets by mouth daily with breakfast.     cyanocobalamin 1000 MCG/ML injection  Commonly known as:  (VITAMIN B-12)  Inject 1,000 mcg into the muscle every 30 (thirty) days.     fish oil-omega-3 fatty acids 1000 MG capsule  Take 2 g by mouth daily.     IBUPROFEN PM 200-38 MG Tabs  Generic drug:  Ibuprofen-Diphenhydramine Cit  Take 1 tablet by mouth at bedtime as needed. For sleep     lisinopril-hydrochlorothiazide 20-12.5 MG per tablet  Commonly known as:  PRINZIDE,ZESTORETIC  Take 1 tablet by mouth 2 (two) times daily.     metoprolol 100 MG tablet  Commonly known as:  LOPRESSOR  Take 100 mg by mouth 2 (two) times daily.     pantoprazole 40 MG tablet  Commonly known as:  PROTONIX  Take 1 tablet (40 mg total) by mouth daily.     pravastatin 80 MG tablet  Commonly known as:  PRAVACHOL  Take 80 mg by mouth every evening.     tamsulosin 0.4 MG Caps capsule  Commonly known as:  FLOMAX  Take 1 capsule (0.4 mg total) by mouth daily after supper.       Allergies  Allergen Reactions  . Cortisone     Causes emotional problems        Follow-up Information   Follow up with [name], MD In 2 weeks. (after discharge from SNF)    Specialty:   Specialist   Contact information:   [place] [address] [phone]       Follow up with [facility]. (office will call.)    Contact information:   [address] [place] [address] [phone]       The results of significant diagnostics from this hospitalization (including imaging, microbiology, ancillary and laboratory) are listed below for reference.    Significant Diagnostic Studies: Ct Head Wo Contrast  03/31/2013   CLINICAL DATA:  Fall. Laceration at the bridge of the nose. Bleeding, soft tissue swelling.  EXAM: CT HEAD WITHOUT CONTRAST  TECHNIQUE: Contiguous axial images were obtained from the base of the skull through the vertex without intravenous contrast.  COMPARISON:  MR HEAD W/O CM dated 11/03/2008; CT HEAD W/O CM dated 11/03/2008  FINDINGS: Fractures are noted involving the right nasal bones with minimal depression. Slight rightward  nasal septal deviation, likely on a chronic basis. Paranasal sinuses and mastoids are clear.  There is atrophy and chronic small vessel disease changes. No acute intracranial abnormality. Specifically, no hemorrhage, hydrocephalus, mass lesion, acute infarction, or significant intracranial injury. No acute calvarial abnormality.  IMPRESSION: No acute intracranial abnormality.  Atrophy, chronic microvascular disease.  Right nasal bone fractures.   Electronically Signed   By: Rolm Baptise M.D.   On: 03/31/2013 16:24   Ct Abdomen Pelvis W Contrast  04/05/2013   CLINICAL DATA:  Nausea, vomiting, diarrhea. Left lower quadrant pain.  EXAM: CT ABDOMEN AND PELVIS WITH CONTRAST  TECHNIQUE: Multidetector CT imaging of the abdomen and pelvis was performed using the standard protocol following bolus administration of intravenous contrast.  CONTRAST:  18mL OMNIPAQUE IOHEXOL 300 MG/ML  SOLN  COMPARISON:  None.  FINDINGS: Innumerable hepatic well-circumscribed lucencies consistent with multiple cysts noted. Largest measures  3.8 cm in maximum diameter, image number 16/series 2. Spleen normal. Splenosis. Pancreas normal. Gallbladder nondistended. No biliary distention.  Adrenals are normal. Multiple small approximately 1 cm right renal cysts are present. No hydronephrosis. No evidence of obstructing ureteral stone. The bladder is distended. The prostate is moderately enlarged with a slightly irregular contour. BPH and/or prostate malignancy cannot be excluded .  No significant adenopathy. A 2.7 x 2.5 cm small infrarenal suprailiac abdominal aortic aneurysm is present. Visceral vessels are patent.  Appendix is normal. Large amount of fluid is noted colon. This is most consistent with a diarrheal illness. There is no evidence of bowel distention. No free air. Stomach is nondistended. Oral contrast is in the distal esophagus. Gastroesophageal reflux cannot be excluded.  Mild basilar atelectasis versus pneumonia. Small pleural effusions versus pleural scarring. Cardiomegaly. Coronary artery disease. Tiny sclerotic densities in the pelvis as well as the thoracolumbar vertebral bodies. These may just represent bone islands. However whole-body bone scan is suggested to evaluate for blastic metastatic disease.  IMPRESSION: 1. Prominent amount of fluid noted throughout the colon. This is most consistent with a diarrheal illness. No evidence of bowel obstruction.  2. Prostate is moderately enlarged with irregular contour. The bladder is distended. BPH and/or prostate malignancy cannot be excluded.  3. Oral contrast is noted in the distal esophagus. Gastroesophageal reflux cannot be excluded.  4.  Mild bibasilar atelectasis versus pneumonia.  5. Tiny sclerotic densities noted throughout the spine and pelvis. Although these may represent tiny bone islands blastic metastatic disease cannot be excluded and whole body bone scan suggested.  6. Small 2.7 x 2.5 cm infrarenal suprailiac abdominal aortic aneurysm.  7. Cardiomegaly.  Coronary artery disease.    Electronically Signed   By: Marcello Moores  Register   On: 04/05/2013 09:15   Dg Swallowing Func-speech Pathology  04/08/2013   Chryl Heck Dankof, CCC-SLP     04/08/2013  2:09 PM Objective Swallowing Evaluation: Modified Barium Swallowing Study   Patient Details  Name: Netanel Haneline MRN: [id number] Date of Birth: January 10, 1926  Today's Date: 04/08/2013 Time: [id number] SLP Time Calculation (min): 60 min  Past Medical History:  Past Medical History  Diagnosis Date  . Arthritis   . Hypertension   . Osteoporosis    Past Surgical History:  Past Surgical History  Procedure Laterality Date  . Spine surgery     HPI:  80 yo male adm to [facility] with n/v and diarrhea - cdif negative  diagnosed with gastroenteritis. Pt recently fell and had right  nasal bone fractures.  He has h/o peripheral neuropathy.    CT  head 3/20 negative for acute event, abdomen of pelvis with  contrast showed mild basilar ATX vs pna, gastroreflux not  excluded-oral contrast in distal esophagus.  Diet advanced to  full liquid diet today - pt has been on clears due to his  gastroenteritis.  Pt admits to occasional issues with sensation  of food lodging in throat (pointing to pharynx) with increased  occurence since his recent fall.  MBS indicated following results  of BSE.       Assessment / Plan / Recommendation Clinical Impression  Dysphagia Diagnosis: Moderate oral phase dysphagia;Moderate  pharyngeal phase dysphagia;Moderate cervical esophageal phase  dysphagia Moderate oral dysphagia marked by lingual weakness and decreased  coordination.  Severe delay in oral transit with oral holding  with all consistencies.  Sluggish velopharyngeal closure  observed.  Moderate sensory motor pharyngeal dysphagia.  Sharp  curvature of cervical spine affected complete epiglottic  deflection resulting in decreased airway closure.  Decreased TBR  resulting in pooling in valleculae with thin liquid barium by  cup, puree, and mechanical soft consistency.  Severe residuals in  vallecular  space s/p swallow of all consistencies requiring  multiple dry swallows to clear.  Patient unable to complete dry  swallow with attempted chin tuck.  Silent penetration during  swallow with thin liquid barium and puree but able to clear with  instructed throat clear.  Aspiration during swallow of thin  liquid with use of straw with delayed cough.  Whole barium tablet  contained in vallecular space and "hung" above airway prior to  complete bolus transit.  Barium tablet lodged in cervical  esophagus requiring further puree trials and instructed dry  swallows to complete transit.  Patient on full liquid diet at this time due to gastroenteritis.   Recommend to upgrade to mechanical soft as tolerated.  Continue  thin liquids (cup sips only) and recommend to administer  medication crushed in puree.  Diagnostic treatment completed  following study focusing on providing recommendations to patient  and nursing.   ST to follow in acute care setting for diet tolerance.  Recommend  continued Skilled ST at next level of care for dysphagia  management.  Repeat MBS not indicated at this time.  May benefit  from GI consult to further assess esophageal functioning.      Treatment Recommendation  Therapy as outlined in treatment plan below    Diet Recommendation Dysphagia 3 (Mechanical Soft);Thin liquid   Liquid Administration via: Cup;No straw Medication Administration: Crushed with puree Supervision: Patient able to self feed;Full supervision/cueing  for compensatory strategies Compensations: Slow rate;Small sips/bites;Multiple dry swallows  after each bite/sip;Clear throat intermittently;Effortful swallow Postural Changes and/or Swallow Maneuvers: Seated upright 90  degrees;Upright 30-60 min after meal;Out of bed for meals    Other  Recommendations Recommended Consults: Consider GI  evaluation Oral Care Recommendations: Oral care BID Other Recommendations: Clarify dietary restrictions   Follow Up Recommendations  Skilled Nursing  facility    Frequency and Duration min 2x/week  2 weeks       SLP Swallow Goals  Please refer to Care Plans    General Date of Onset: 04/07/13 HPI: 80 yo male adm to [facility] with n/v and diarrhea - cdif negative  diagnosed with gastroenteritis. Pt recently fell and had right  nasal bone fractures.  He has h/o peripheral neuropathy.    CT  head 3/20 negative for acute event, abdomen of pelvis with  contrast showed mild basilar ATX vs pna, gastroreflux not  excluded-oral contrast in  distal esophagus.  Diet advanced to  full liquid diet today - pt has been on clears due to his  gastroenteritis.  Pt admits to occasional issues with sensation  of food lodging in throat (pointing to pharynx) with increased  occurence since his recent fall.  He reports his daughters told  him that he has had a TIA previously - although not seen  Type of Study: Modified Barium Swallowing Study Reason for Referral: Objectively evaluate swallowing function Diet Prior to this Study: Thin liquids Temperature Spikes Noted: No Respiratory Status: Room air History of Recent Intubation: No Behavior/Cognition: Alert;Cooperative;Pleasant mood Oral Cavity - Dentition: Missing dentition Oral Motor / Sensory Function: Impaired - see Bedside swallow  eval Self-Feeding Abilities: Able to feed self Patient Positioning: Upright in bed Baseline Vocal Quality: Clear Volitional Cough: Strong Volitional Swallow: Able to elicit Anatomy: Within functional limits Pharyngeal Secretions: Not observed secondary MBS    Reason for Referral Objectively evaluate swallowing function   Oral Phase Oral Preparation/Oral Phase Oral Phase: Impaired Oral - Thin Oral - Thin Teaspoon: Incomplete tongue to palate contact;Weak  lingual manipulation;Lingual pumping;Reduced posterior  propulsion;Lingual/palatal residue;Piecemeal swallowing;Decreased  velopharyngeal closure;Delayed oral transit Oral - Thin Cup: Incomplete tongue to palate contact;Piecemeal  swallowing;Decreased  velopharyngeal closure;Reduced posterior  propulsion;Delayed oral transit;Weak lingual manipulation;Lingual  pumping;Lingual/palatal residue Oral - Solids Oral - Puree: Lingual/palatal residue;Lingual pumping;Incomplete  tongue to palate contact;Piecemeal swallowing;Decreased  velopharyngeal closure;Reduced posterior propulsion;Weak lingual  manipulation;Delayed oral transit Oral - Mechanical Soft: Lingual pumping;Lingual/palatal  residue;Weak lingual manipulation;Impaired mastication;Delayed  oral transit;Reduced posterior propulsion;Decreased  velopharyngeal closure;Piecemeal swallowing;Incomplete tongue to  palate contact   Pharyngeal Phase Pharyngeal Phase Pharyngeal Phase: Impaired Pharyngeal - Thin Pharyngeal - Thin Cup: Reduced pharyngeal peristalsis;Reduced  laryngeal elevation;Reduced airway/laryngeal closure;Trace  aspiration;Penetration/Aspiration during swallow;Reduced tongue  base retraction;Pharyngeal residue - valleculae;Pharyngeal  residue - posterior pharnyx;Premature spillage to pyriform  sinuses Penetration/Aspiration details (thin cup): Material enters  airway, remains ABOVE vocal cords then ejected out Pharyngeal - Thin Straw: Reduced pharyngeal peristalsis;Reduced  tongue base retraction;Penetration/Aspiration during  swallow;Reduced airway/laryngeal closure;Reduced laryngeal  elevation;Reduced anterior laryngeal mobility;Premature spillage  to pyriform sinuses;Pharyngeal residue - valleculae;Moderate  aspiration;Pharyngeal residue - posterior pharnyx Penetration/Aspiration details (thin straw): Material enters  airway, passes BELOW cords and not ejected out despite cough  attempt by patient Pharyngeal - Solids Pharyngeal - Puree: Reduced tongue base retraction;Reduced  pharyngeal peristalsis;Penetration/Aspiration during  swallow;Reduced anterior laryngeal mobility;Reduced laryngeal  elevation;Reduced airway/laryngeal closure;Pharyngeal residue -  valleculae;Pharyngeal residue - posterior  pharnyx Penetration/Aspiration details (puree): Material enters airway,  remains ABOVE vocal cords then ejected out Pharyngeal - Mechanical Soft: Reduced pharyngeal  peristalsis;Reduced tongue base retraction;Reduced anterior  laryngeal mobility;Reduced laryngeal elevation;Reduced  airway/laryngeal closure;Pharyngeal residue -  valleculae;Pharyngeal residue - posterior pharnyx Pharyngeal - Pill: Reduced pharyngeal peristalsis;Reduced tongue  base retraction;Reduced anterior laryngeal mobility;Reduced  laryngeal elevation;Reduced airway/laryngeal closure;Premature  spillage to valleculae;Pharyngeal residue - valleculae  Cervical Esophageal Phase    GO   Sharman Crate MS, CCC-SLP [phone] Cervical Esophageal Phase Cervical Esophageal Phase: Impaired Cervical Esophageal Phase - Comment Cervical Esophageal Comment: Whole barium tablet contained in  cervicial esophagus          [facility] 04/08/2013, 1:47 PM     Microbiology: Recent Results (from the past 240 hour(s))  URINE CULTURE     Status: None   Collection Time    03/31/13  5:49 PM      Result Value Ref Range Status   Specimen Description URINE, CLEAN CATCH   Final   Special Requests NONE  Final   Culture  Setup Time     Final   Value: 04/01/2013 01:12     Performed at [place]     Final   Value: NO GROWTH     Performed at [facility]   Culture     Final   Value: NO GROWTH     Performed at [facility]   Report Status 04/02/2013 FINAL   Final  CLOSTRIDIUM DIFFICILE BY PCR     Status: None   Collection Time    04/05/13  8:12 AM      Result Value Ref Range Status   C difficile by pcr NEGATIVE  NEGATIVE Final   Comment: Performed at [place]: Basic Metabolic Panel:  Recent Labs Lab 04/05/13 0300 04/06/13 0453  NA 140 138  K 3.7 3.6*  CL 104 108  CO2 20 19  GLUCOSE 128* 84  BUN 30* 15  CREATININE 1.14 0.91  CALCIUM 9.3 8.1*   Liver Function Tests:  Recent  Labs Lab 04/05/13 0300  AST 22  ALT 14  ALKPHOS 71  BILITOT 0.4  PROT 7.0  ALBUMIN 3.5   No results found for this basename: LIPASE, AMYLASE,  in the last 168 hours No results found for this basename: AMMONIA,  in the last 168 hours CBC:  Recent Labs Lab 04/05/13 0300  WBC 6.2  NEUTROABS 4.6  HGB 16.2  HCT 46.5  MCV 92.4  PLT 175   Cardiac Enzymes: No results found for this basename: CKTOTAL, CKMB, CKMBINDEX, TROPONINI,  in the last 168 hours BNP: BNP (last 3 results) No results found for this basename: PROBNP,  in the last 8760 hours CBG: No results found for this basename: GLUCAP,  in the last 168 hours     Signed:  Louellen Molder  [facility] 04/10/2013, 1:26 PM

## 2013-04-10 NOTE — Progress Notes (Signed)
Patient discharge to [facility] via ambulance. RN called and gave report.

## 2013-04-10 NOTE — Progress Notes (Signed)
Patient cleared for discharge. Packet copied and placed in [place]. Daughter called and is aware and agreeable to transfer. ptar called for transportation.  Alistar Mcenery C. Grayson MSW, [place]

## 2013-04-10 NOTE — Progress Notes (Signed)
Discharge Report called to [facility] place.

## 2013-04-11 ENCOUNTER — Encounter: Payer: Self-pay | Admitting: *Deleted

## 2013-04-14 ENCOUNTER — Non-Acute Institutional Stay (SKILLED_NURSING_FACILITY): Payer: Medicare Other | Admitting: Internal Medicine

## 2013-04-14 DIAGNOSIS — K5289 Other specified noninfective gastroenteritis and colitis: Secondary | ICD-10-CM | POA: Diagnosis not present

## 2013-04-14 DIAGNOSIS — Z9181 History of falling: Secondary | ICD-10-CM

## 2013-04-14 DIAGNOSIS — R5381 Other malaise: Secondary | ICD-10-CM | POA: Diagnosis not present

## 2013-04-14 DIAGNOSIS — N320 Bladder-neck obstruction: Secondary | ICD-10-CM

## 2013-04-14 DIAGNOSIS — R531 Weakness: Secondary | ICD-10-CM

## 2013-04-14 DIAGNOSIS — K529 Noninfective gastroenteritis and colitis, unspecified: Secondary | ICD-10-CM

## 2013-04-14 DIAGNOSIS — K219 Gastro-esophageal reflux disease without esophagitis: Secondary | ICD-10-CM

## 2013-04-14 DIAGNOSIS — R296 Repeated falls: Secondary | ICD-10-CM

## 2013-04-14 DIAGNOSIS — R5383 Other fatigue: Secondary | ICD-10-CM | POA: Diagnosis not present

## 2013-04-14 DIAGNOSIS — I10 Essential (primary) hypertension: Secondary | ICD-10-CM | POA: Diagnosis not present

## 2013-04-15 ENCOUNTER — Encounter: Payer: Self-pay | Admitting: Internal Medicine

## 2013-04-15 NOTE — Assessment & Plan Note (Addendum)
Complicating is peripheral neuropathy;OT/PT

## 2013-04-15 NOTE — Assessment & Plan Note (Signed)
Secondary to rotavirus infection. Stool for C. difficile negative.  Continue IV hydration with normal saline at 75 cc/ hr  -CT scan of the abdomen and pelvis negative for obstruction or colitis. enlarged prostate with distended bladder suggestive of BPH or prostate malignancy. Also for finding of gastroesophageal reflux.  Now resolved  -advance diet to dys level 3

## 2013-04-15 NOTE — Assessment & Plan Note (Signed)
See above

## 2013-04-15 NOTE — Assessment & Plan Note (Signed)
Patient gives history oral hesitancy and increased urinary frequency at home. Enlarged prostate and urinary bladder on CT scan. Given urinary retention and on bladder scan a Foley catheter was placed on admission. I have started him on Flomax. PSA normal.  CT scan also shows tiny scattered densities throughout the spine and pelvis which cannot rule out blastic metastases disease. He will need bone scan as outpatient.  -[facility] for outpatient evaluation for his BPH and voiding trial.

## 2013-04-15 NOTE — Assessment & Plan Note (Signed)
Continue proronix

## 2013-04-15 NOTE — Progress Notes (Signed)
MRN: [id number] Name: [name] [name]  Sex: male Age: 80 y.o. DOB: [birth year]  [place] #: Ron[facility] Facility/Room: [id number] Level Of Care: SNF Provider: Inocencio Homes D Emergency Contacts: Extended Emergency Contact Information Primary Emergency Contact: Richardson,Marta Address: [address]          [place], [place] [address] [place] Phone: [phone] Work Phone: [phone] Mobile Phone: [phone] Relation: Daughter Secondary Emergency Contact: Aretta Nip [place] Phone: [phone] Relation: Daughter  Code Status: FULL  Allergies: Cortisone  Chief Complaint  Patient presents with  . nursing home admission    HPI: Patient is 80 y.o. male who is admitted to SNF with h/o falls and weakness for OT/PT. Pt was just hospitalized with rotavirus.  Past Medical History  Diagnosis Date  . Arthritis   . Hypertension   . Osteoporosis   . GERD (gastroesophageal reflux disease)   . Thyroid disease     Hyperthyroidism    Past Surgical History  Procedure Laterality Date  . Spine surgery        Medication List       This list is accurate as of: 04/14/13 11:59 PM.  Always use your most recent med list.               amLODipine 10 MG tablet  Commonly known as:  NORVASC  Take 5 mg by mouth at bedtime.     aspirin 325 MG tablet  Take 325 mg by mouth daily.     calcium-vitamin D 500-200 MG-UNIT per tablet  Commonly known as:  OSCAL WITH D  Take 2 tablets by mouth daily with breakfast.     cyanocobalamin 1000 MCG/ML injection  Commonly known as:  (VITAMIN B-12)  Inject 1,000 mcg into the muscle every 30 (thirty) days.     fish oil-omega-3 fatty acids 1000 MG capsule  Take 2 g by mouth daily.     IBUPROFEN PM 200-38 MG Tabs  Generic drug:  Ibuprofen-Diphenhydramine Cit  Take 1 tablet by mouth at bedtime as needed. For sleep     lisinopril-hydrochlorothiazide 20-12.5 MG per tablet  Commonly known as:  PRINZIDE,ZESTORETIC  Take 1  tablet by mouth 2 (two) times daily.     metoCLOPramide 5 MG tablet  Commonly known as:  REGLAN  Take 1 tablet (5 mg total) by mouth 3 (three) times daily before meals.     metoprolol 100 MG tablet  Commonly known as:  LOPRESSOR  Take 100 mg by mouth 2 (two) times daily.     pantoprazole 40 MG tablet  Commonly known as:  PROTONIX  Take 1 tablet (40 mg total) by mouth daily.     pravastatin 80 MG tablet  Commonly known as:  PRAVACHOL  Take 80 mg by mouth every evening.     tamsulosin 0.4 MG Caps capsule  Commonly known as:  FLOMAX  Take 1 capsule (0.4 mg total) by mouth daily after supper.        No orders of the defined types were placed in this encounter.     There is no immunization history on file for this patient.  History  Substance Use Topics  . Smoking status: Former Research scientist (life sciences)  . Smokeless tobacco: Not on file  . Alcohol Use: No    Family history is noncontributory    Review of Systems  DATA OBTAINED: from patient GENERAL: Feels well no fevers, fatigue, appetite changes SKIN: No itching, rash or wounds EYES: No eye pain, redness, discharge  EARS: No earache, tinnitus, change in hearing NOSE: No congestion, drainage or bleeding  MOUTH/THROAT: No mouth or tooth pain, No sore throat RESPIRATORY: No cough, wheezing, SOB CARDIAC: No chest pain, palpitations, lower extremity edema  GI: No abdominal pain, No N/V/D or constipation, No heartburn or reflux  GU: has foley MUSCULOSKELETAL: No unrelieved bone/joint pain NEUROLOGIC: No headache, dizziness or focal weakness PSYCHIATRIC: No overt anxiety or sadness. Sleeps well. No behavior issue.   Filed Vitals:   04/15/13 1451  BP: 131/75  Pulse: 71  Temp: 96.6 F (35.9 C)  Resp: 18    Physical Exam  GENERAL APPEARANCE: Alert, conversant. Appropriately groomed. No acute distress.  SKIN: No diaphoresis rash HEAD: Normocephalic, atraumatic  EYES: Conjunctiva/lids clear. Pupils round, reactive. EOMs intact.   EARS: External exam WNL, canals clear. Hearing grossly normal.  NOSE: No deformity or discharge.  MOUTH/THROAT: Lips w/o lesions  RESPIRATORY: Breathing is even, unlabored. Lung sounds are clear   CARDIOVASCULAR: Heart RRR no murmurs, rubs or gallops. No peripheral edema. GASTROINTESTINAL: Abdomen is soft, non-tender, not distended w/ normal bowel sounds GENITOURINARY: Bladder non tender, not distended ; indwelling foley MUSCULOSKELETAL: No abnormal joints or musculature NEUROLOGIC: Oriented X3. Cranial nerves 2-12 grossly intact. Moves all extremities no tremor. PSYCHIATRIC: Mood and affect appropriate to situation, no behavioral issues  Patient Active Problem List   Diagnosis Date Noted  . Dysphagia, pharyngoesophageal phase 04/10/2013  . Frequent falls 04/10/2013  . Weakness 04/10/2013  . Hyperthyroidism, subclinical 04/10/2013  . GERD (gastroesophageal reflux disease) 04/06/2013  . Bladder outflow obstruction 04/06/2013  . Dehydration 04/05/2013  . Hypertension 04/05/2013  . Arthritis 04/05/2013  . Peripheral neuropathy 04/05/2013  . Gastroenteritis, acute 04/05/2013  . Gastroenteritis 04/05/2013    CBC    Component Value Date/Time   WBC 6.2 04/05/2013 0300   RBC 5.03 04/05/2013 0300   HGB 16.2 04/05/2013 0300   HCT 46.5 04/05/2013 0300   PLT 175 04/05/2013 0300   MCV 92.4 04/05/2013 0300   LYMPHSABS 0.6* 04/05/2013 0300   MONOABS 0.9 04/05/2013 0300   EOSABS 0.1 04/05/2013 0300   BASOSABS 0.0 04/05/2013 0300    CMP     Component Value Date/Time   NA 138 04/06/2013 0453   K 3.6* 04/06/2013 0453   CL 108 04/06/2013 0453   CO2 19 04/06/2013 0453   GLUCOSE 84 04/06/2013 0453   BUN 15 04/06/2013 0453   CREATININE 0.91 04/06/2013 0453   CALCIUM 8.1* 04/06/2013 0453   PROT 7.0 04/05/2013 0300   ALBUMIN 3.5 04/05/2013 0300   AST 22 04/05/2013 0300   ALT 14 04/05/2013 0300   ALKPHOS 71 04/05/2013 0300   BILITOT 0.4 04/05/2013 0300   GFRNONAA 75* 04/06/2013 0453   GFRAA 87* 04/06/2013  0453    Assessment and Plan  Gastroenteritis, acute Secondary to rotavirus infection. Stool for C. difficile negative.  Continue IV hydration with normal saline at 75 cc/ hr  -CT scan of the abdomen and pelvis negative for obstruction or colitis. enlarged prostate with distended bladder suggestive of BPH or prostate malignancy. Also for finding of gastroesophageal reflux.  Now resolved  -advance diet to dys level 3    Frequent falls Complicating is peripheral neuropathy;OT/PT  Weakness See above  Hypertension Continue zostoretic, lopressor and norvasc; BP is controlled today  Bladder outflow obstruction Patient gives history oral hesitancy and increased urinary frequency at home. Enlarged prostate and urinary bladder on CT scan. Given urinary retention and on bladder scan a Foley catheter was placed  on admission. I have started him on Flomax. PSA normal.  CT scan also shows tiny scattered densities throughout the spine and pelvis which cannot rule out blastic metastases disease. He will need bone scan as outpatient.  -[facility] for his BPH and voiding trial.      GERD (gastroesophageal reflux disease) Continue proronix    Hennie Duos, MD

## 2013-04-15 NOTE — Assessment & Plan Note (Signed)
Continue zostoretic, lopressor and norvasc; BP is controlled today

## 2013-04-20 DIAGNOSIS — R339 Retention of urine, unspecified: Secondary | ICD-10-CM | POA: Diagnosis not present

## 2013-04-20 DIAGNOSIS — N401 Enlarged prostate with lower urinary tract symptoms: Secondary | ICD-10-CM | POA: Diagnosis not present

## 2013-04-26 ENCOUNTER — Other Ambulatory Visit: Payer: Self-pay | Admitting: Internal Medicine

## 2013-04-26 DIAGNOSIS — C61 Malignant neoplasm of prostate: Secondary | ICD-10-CM

## 2013-05-02 ENCOUNTER — Encounter (HOSPITAL_COMMUNITY)
Admission: RE | Admit: 2013-05-02 | Discharge: 2013-05-02 | Disposition: A | Discharge status: Home / Self Care | Payer: No Typology Code available for payment source | Source: Ambulatory Visit | Attending: Internal Medicine | Admitting: Internal Medicine

## 2013-05-02 ENCOUNTER — Ambulatory Visit (HOSPITAL_COMMUNITY)
Admission: RE | Admit: 2013-05-02 | Discharge: 2013-05-02 | Disposition: A | Discharge status: Home / Self Care | Payer: No Typology Code available for payment source | Source: Ambulatory Visit | Attending: Internal Medicine | Admitting: Internal Medicine

## 2013-05-02 DIAGNOSIS — C61 Malignant neoplasm of prostate: Secondary | ICD-10-CM | POA: Diagnosis not present

## 2013-05-02 MED ORDER — TECHNETIUM TC 99M MEDRONATE IV KIT
25.0000 | PACK | Freq: Once | INTRAVENOUS | Status: AC | PRN
  Administered 2013-05-02: 25 via INTRAVENOUS

## 2013-05-04 DIAGNOSIS — N401 Enlarged prostate with lower urinary tract symptoms: Secondary | ICD-10-CM | POA: Diagnosis not present

## 2013-05-04 DIAGNOSIS — R339 Retention of urine, unspecified: Secondary | ICD-10-CM | POA: Diagnosis not present

## 2013-05-12 DIAGNOSIS — M1990 Unspecified osteoarthritis, unspecified site: Secondary | ICD-10-CM | POA: Diagnosis not present

## 2013-05-12 DIAGNOSIS — I2510 Atherosclerotic heart disease of native coronary artery without angina pectoris: Secondary | ICD-10-CM | POA: Diagnosis not present

## 2013-05-12 DIAGNOSIS — I10 Essential (primary) hypertension: Secondary | ICD-10-CM | POA: Diagnosis not present

## 2013-05-12 DIAGNOSIS — R262 Difficulty in walking, not elsewhere classified: Secondary | ICD-10-CM | POA: Diagnosis not present

## 2013-05-12 DIAGNOSIS — M6281 Muscle weakness (generalized): Secondary | ICD-10-CM | POA: Diagnosis not present

## 2013-05-12 DIAGNOSIS — Z9181 History of falling: Secondary | ICD-10-CM | POA: Diagnosis not present

## 2013-05-12 DIAGNOSIS — K5289 Other specified noninfective gastroenteritis and colitis: Secondary | ICD-10-CM | POA: Diagnosis not present

## 2013-05-12 DIAGNOSIS — E0590 Thyrotoxicosis, unspecified without thyrotoxic crisis or storm: Secondary | ICD-10-CM | POA: Diagnosis not present

## 2013-05-12 DIAGNOSIS — D518 Other vitamin B12 deficiency anemias: Secondary | ICD-10-CM | POA: Diagnosis not present

## 2013-05-12 DIAGNOSIS — N320 Bladder-neck obstruction: Secondary | ICD-10-CM | POA: Diagnosis not present

## 2013-05-12 DIAGNOSIS — E785 Hyperlipidemia, unspecified: Secondary | ICD-10-CM | POA: Diagnosis not present

## 2013-05-12 DIAGNOSIS — A080 Rotaviral enteritis: Secondary | ICD-10-CM | POA: Diagnosis not present

## 2013-05-12 DIAGNOSIS — G589 Mononeuropathy, unspecified: Secondary | ICD-10-CM | POA: Diagnosis not present

## 2013-05-12 DIAGNOSIS — R5381 Other malaise: Secondary | ICD-10-CM | POA: Diagnosis not present

## 2013-05-12 DIAGNOSIS — N401 Enlarged prostate with lower urinary tract symptoms: Secondary | ICD-10-CM | POA: Diagnosis not present

## 2013-05-12 DIAGNOSIS — R1312 Dysphagia, oropharyngeal phase: Secondary | ICD-10-CM | POA: Diagnosis not present

## 2013-05-12 DIAGNOSIS — K219 Gastro-esophageal reflux disease without esophagitis: Secondary | ICD-10-CM | POA: Diagnosis not present

## 2013-05-12 DIAGNOSIS — M818 Other osteoporosis without current pathological fracture: Secondary | ICD-10-CM | POA: Diagnosis not present

## 2013-05-30 ENCOUNTER — Ambulatory Visit (INDEPENDENT_AMBULATORY_CARE_PROVIDER_SITE_OTHER): Payer: Medicare Other | Admitting: Cardiovascular Disease

## 2013-05-30 ENCOUNTER — Encounter: Payer: Self-pay | Admitting: Cardiovascular Disease

## 2013-05-30 VITALS — BP 110/64 | HR 62 | Resp 16 | Ht 71.0 in | Wt 192.0 lb

## 2013-05-30 DIAGNOSIS — I10 Essential (primary) hypertension: Secondary | ICD-10-CM | POA: Diagnosis not present

## 2013-05-30 DIAGNOSIS — E785 Hyperlipidemia, unspecified: Secondary | ICD-10-CM

## 2013-05-30 DIAGNOSIS — I2510 Atherosclerotic heart disease of native coronary artery without angina pectoris: Secondary | ICD-10-CM | POA: Diagnosis not present

## 2013-05-30 DIAGNOSIS — I499 Cardiac arrhythmia, unspecified: Secondary | ICD-10-CM | POA: Diagnosis not present

## 2013-05-30 NOTE — Patient Instructions (Signed)
No mediation changes.  [place] to do orthostatic BP's daily x 7 days.  Dr. [name] recommends that you schedule a follow-up appointment in: One Year.  PLEASE SEND RECENT VITALS DONE OVER THE PAST WEEK.

## 2013-05-31 ENCOUNTER — Encounter: Payer: Self-pay | Admitting: Adult Health

## 2013-05-31 ENCOUNTER — Non-Acute Institutional Stay (SKILLED_NURSING_FACILITY): Payer: Medicare Other | Admitting: Adult Health

## 2013-05-31 DIAGNOSIS — R5381 Other malaise: Secondary | ICD-10-CM | POA: Diagnosis not present

## 2013-05-31 DIAGNOSIS — N390 Urinary tract infection, site not specified: Secondary | ICD-10-CM

## 2013-05-31 DIAGNOSIS — I10 Essential (primary) hypertension: Secondary | ICD-10-CM

## 2013-05-31 DIAGNOSIS — E7800 Pure hypercholesterolemia, unspecified: Secondary | ICD-10-CM | POA: Insufficient documentation

## 2013-05-31 DIAGNOSIS — K219 Gastro-esophageal reflux disease without esophagitis: Secondary | ICD-10-CM | POA: Diagnosis not present

## 2013-05-31 DIAGNOSIS — N320 Bladder-neck obstruction: Secondary | ICD-10-CM

## 2013-05-31 DIAGNOSIS — I2510 Atherosclerotic heart disease of native coronary artery without angina pectoris: Secondary | ICD-10-CM

## 2013-05-31 DIAGNOSIS — R531 Weakness: Secondary | ICD-10-CM

## 2013-05-31 DIAGNOSIS — R5383 Other fatigue: Secondary | ICD-10-CM

## 2013-05-31 DIAGNOSIS — E0590 Thyrotoxicosis, unspecified without thyrotoxic crisis or storm: Secondary | ICD-10-CM

## 2013-05-31 DIAGNOSIS — E785 Hyperlipidemia, unspecified: Secondary | ICD-10-CM

## 2013-05-31 DIAGNOSIS — N400 Enlarged prostate without lower urinary tract symptoms: Secondary | ICD-10-CM | POA: Insufficient documentation

## 2013-05-31 NOTE — Progress Notes (Signed)
Patient ID: [name] [name], male   DOB: 15-Aug-1925, 80 y.o.   MRN: [id number]               PROGRESS NOTE  DATE: 05/31/2013  FACILITY: Nursing Home Location: [facility]  LEVEL OF CARE: SNF (31)  Routine Visit  CHIEF COMPLAINT:  Manage Weakness, Hypertension, CAD and GERD  HISTORY OF PRESENT ILLNESS:  REASSESSMENT OF ONGOING PROBLEM(S):  HTN: Pt 's HTN remains stable.  Denies CP, sob, DOE, pedal edema, headaches, dizziness or visual disturbances.  No complications from the medications currently being used.  Last BP : 124/57  CAD: The angina has been stable. The patient denies dyspnea on exertion, orthopnea, pedal edema, palpitations and paroxysmal nocturnal dyspnea. No complications noted from the medication presently being used.  GERD: pt's GERD is stable.  Denies ongoing heartburn, abd. Pain, nausea or vomiting.  Currently on a PPI & tolerates it without any adverse reactions.   PAST MEDICAL HISTORY : Reviewed.  No changes/see problem list  CURRENT MEDICATIONS: Reviewed per MAR/see medication list  REVIEW OF SYSTEMS:  GENERAL: no change in appetite, no fatigue, no weight changes, no fever, chills or weakness RESPIRATORY: no cough, SOB, DOE, wheezing, hemoptysis CARDIAC: no chest pain, edema or palpitations GI: no abdominal pain, diarrhea, constipation, heart burn, nausea or vomiting  PHYSICAL EXAMINATION  GENERAL: no acute distress, normal body habitus EYES: conjunctivae normal, sclerae normal, normal eye lids NECK: supple, trachea midline, no neck masses, no thyroid tenderness, no thyromegaly LYMPHATICS: no LAN in the neck, no supraclavicular LAN RESPIRATORY: breathing is even & unlabored, BS CTAB CARDIAC: RRR, no murmur,no extra heart sounds, no edema GI: abdomen soft, normal BS, no masses, no tenderness, no hepatomegaly, no splenomegaly EXTREMITIES:  Able to move all 4 extremities PSYCHIATRIC: the patient is alert & oriented to person, affect & behavior  appropriate  LABS/RADIOLOGY: Labs reviewed: Basic Metabolic Panel:  Recent Labs  03/31/13 1601 04/05/13 0300 04/06/13 0453  NA 140 140 138  K 4.2 3.7 3.6*  CL 101 104 108  CO2 26 20 19   GLUCOSE 135* 128* 84  BUN 28* 30* 15  CREATININE 1.04 1.14 0.91  CALCIUM 9.6 9.3 8.1*   Liver Function Tests:  Recent Labs  04/05/13 0300  AST 22  ALT 14  ALKPHOS 71  BILITOT 0.4  PROT 7.0  ALBUMIN 3.5   CBC:  Recent Labs  03/31/13 1601 04/05/13 0300  WBC 7.5 6.2  NEUTROABS 5.2 4.6  HGB 16.2 16.2  HCT 47.2 46.5  MCV 93.3 92.4  PLT 194 175    ASSESSMENT/PLAN:  Weakness - continue rehabilitation Hypertension - well-controlled; continue Lopressor,, Norvasc and Zestoretic Bladder Outlet Obstruction - continue Flomax and Proscar GERD - stable; continue Protonix CAD - stable; continue ASA Hyperthyroidism, sbclinical - check tsh UTI - continue Bactrim DS Hyperlipidemia - continue Lipitor   CPT CODE: 60454  Ashana Tullo Vargas - NP [facility] [phone]

## 2013-06-01 ENCOUNTER — Encounter: Payer: Self-pay | Admitting: Cardiovascular Disease

## 2013-06-01 NOTE — Progress Notes (Signed)
Patient ID: Lemichael Konig, male   DOB: 06/20/1925, 80 y.o.   MRN: [id number]      Reason for office visit CAD  This is Mr.Hinnant's first visit in 3 years. He has been receiving most of his care in the [place] system. 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 during the cold war.  He has well-established coronary artery disease with chronic total occlusion of the LAD artery following the takeoff of the first diagonal artery. The distal LAD receives collateral flow via the right coronary artery which by previous coronary angiography has a 50% smooth proximal stenosis. His last angiogram was performed in December of 2011 and medical therapy was recommended. A nuclear perfusion study performed in 2009 shows an extensive anteroapical defect with extensive reversibility. Left ventricular systolic function is borderline with an ejection fraction of 50%. He has mild aortic insufficiency and a mildly dilated left atrium but otherwise no significant structural heart disease. He is quite sedentary. He is now a resident at [facility], following a severe bout of rotavirus diarrhea. Earlier he had had a severe fall associated with fracture of his nose. 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  He denies any problems with angina pectoris or dyspnea. He walks very slowly with a walker because of balance problems, likely secondary to peripheral neuropathy. Has problems with urinary retention related to an enlarged prostate. Intermittently, his blood pressure has been low at [facility], although the last few days it seems to be normal.   Allergies  Allergen Reactions  . Cortisone     Causes emotional problems     Current Outpatient Prescriptions  Medication Sig Dispense Refill  . amLODipine (NORVASC) 10 MG tablet Take 5 mg by mouth at bedtime.        [name] aspirin 325 MG tablet Take 325 mg by mouth daily.      [name] atorvastatin (LIPITOR) 10 MG tablet Take 10 mg by mouth daily.      . calcium-vitamin D (OSCAL WITH D) 500-200 MG-UNIT per tablet Take 2 tablets by mouth daily with breakfast.      . cyanocobalamin (,VITAMIN B-12,) 1000 MCG/ML injection Inject 1,000 mcg into the muscle every 30 (thirty) days.       . finasteride (PROSCAR) 5 MG tablet Take 5 mg by mouth daily.      . fish oil-omega-3 fatty acids 1000 MG capsule Take 2 g by mouth daily.      . Ibuprofen-Diphenhydramine Cit (IBUPROFEN PM) 200-38 MG TABS Take 1 tablet by mouth at bedtime as needed. For sleep      . lisinopril-hydrochlorothiazide (PRINZIDE,ZESTORETIC) 20-12.5 MG per tablet Take 1 tablet by mouth 2 (two) times daily.      . metoCLOPramide (REGLAN) 5 MG tablet Take 1 tablet (5 mg total) by mouth 3 (three) times daily before meals.  30 tablet  0  . metoprolol (LOPRESSOR) 100 MG tablet Take 100 mg by mouth 2 (two) times daily.      . pantoprazole (PROTONIX) 40 MG tablet Take 1 tablet (40 mg total) by mouth daily.  30 tablet  0  . tamsulosin (FLOMAX) 0.4 MG CAPS capsule Take 1 capsule (0.4 mg total) by mouth daily after supper.  30 capsule  0   No current facility-administered medications for this visit.  Past Medical History  Diagnosis Date  . Arthritis   . Hypertension   . Osteoporosis   . GERD (gastroesophageal reflux disease)   . Thyroid disease     Hyperthyroidism    Past Surgical History  Procedure Laterality Date  . Spine surgery      No family history on file.  History   Social History  . Marital Status: Divorced    Spouse Name: N/A    Number of Children: N/A  . Years of Education: N/A   Occupational History  . Not on file.   Social History Main Topics  . Smoking status: Former Research scientist (life sciences)  . Smokeless tobacco: Not on file  . Alcohol Use: No  . Drug Use: No  . Sexual Activity: Not on file   Other Topics Concern  . Not on file   Social  History Narrative  . No narrative on file    Review of systems: Functional limitations primarily due to unsteady gait and frequent falls. The patient specifically denies any chest pain at rest or with exertion, dyspnea at rest or with exertion, orthopnea, paroxysmal nocturnal dyspnea, syncope, palpitations, focal neurological deficits, intermittent claudication, lower extremity edema, unexplained weight gain, cough, hemoptysis or wheezing.  The patient also denies abdominal pain, nausea, vomiting, dysphagia, diarrhea, constipation, polyuria, polydipsia, dysuria, hematuria, frequency, urgency, abnormal bleeding or bruising, fever, chills, unexpected weight changes, mood swings, change in skin or hair texture, change in voice quality, auditory or visual problems, allergic reactions or rashes, new musculoskeletal complaints other than usual "aches and pains".   PHYSICAL EXAM BP 110/64  Pulse 62  Resp 16  Ht 5\' 11"  (1.803 m)  Wt 192 lb (87.091 kg)  BMI 26.79 kg/m2  General: Alert, oriented x3, no distress Head: no evidence of trauma, PERRL, EOMI, no exophtalmos or lid lag, no myxedema, no xanthelasma; normal ears, nose and oropharynx Neck: normal jugular venous pulsations and no hepatojugular reflux; brisk carotid pulses without delay and no carotid bruits Chest: clear to auscultation, no signs of consolidation by percussion or palpation, normal fremitus, symmetrical and full respiratory excursions Cardiovascular: normal position and quality of the apical impulse, regular rhythm, normal first and second heart sounds, no murmurs, rubs or gallops Abdomen: no tenderness or distention, no masses by palpation, no abnormal pulsatility or arterial bruits, normal bowel sounds, no hepatosplenomegaly Extremities: no clubbing, cyanosis or edema; 2+ radial, ulnar and brachial pulses bilaterally; 2+ right femoral, posterior tibial and dorsalis pedis pulses; 2+ left femoral, posterior tibial and dorsalis  pedis pulses; no subclavian or femoral bruits Neurological: grossly nonfocal   EKG: Normal sinus rhythm, chronic right bundle branch block, QRS 136 ms, QTC 454 ms  Lipid Panel Checked at the [facility] reportedly normal    BMET    Component Value Date/Time   NA 138 04/06/2013 0453   K 3.6* 04/06/2013 0453   CL 108 04/06/2013 0453   CO2 19 04/06/2013 0453   GLUCOSE 84 04/06/2013 0453   BUN 15 04/06/2013 0453   CREATININE 0.91 04/06/2013 0453   CALCIUM 8.1* 04/06/2013 0453   GFRNONAA 75* 04/06/2013 0453   GFRAA 87* 04/06/2013 0453     ASSESSMENT AND PLAN CAD (coronary artery disease) Mr. Isensee has known total occlusion of the LAD artery with extensive ischemia in the anteroapical distribution, but has done well on medical therapy for the last 4 years. Probably because of his limited level of physical activity he is completely angina free. He is on appropriate treatment with a statin  as well as 2 antianginal medications and aspirin. The dose of aspirin can be reduced to 81 mg daily dose.  Hypertension I'm concerned he may have orthostatic hypotension last the facility where he lives to check sitting and standing blood pressure daily for the next 7 days. If necessary I would reduce the dose of his diuretic but continued treatment with the beta blocker and calcium channel blocker for angina prevention.  Hyperlipidemia His daughter will give [place] a [facility] of [facility]   Patient Instructions  No mediation changes.  [place] to do orthostatic BP's daily x 7 days.  Dr. [name] recommends that you schedule a follow-up appointment in: One Year.  PLEASE SEND RECENT VITALS DONE OVER THE PAST WEEK.      Orders Placed This Encounter  Procedures  . EKG 12-Lead   Meds ordered this encounter  Medications  . finasteride (PROSCAR) 5 MG tablet    Sig: Take 5 mg by mouth daily.  [name] atorvastatin (LIPITOR) 10 MG tablet    Sig: Take 10 mg by mouth daily.    Amarian Botero  [name], MD, [facility] [phone] office [phone] pager

## 2013-06-01 NOTE — Assessment & Plan Note (Signed)
I'm concerned he may have orthostatic hypotension last the facility where he lives to check sitting and standing blood pressure daily for the next 7 days. If necessary I would reduce the dose of his diuretic but continued treatment with the beta blocker and calcium channel blocker for angina prevention.

## 2013-06-01 NOTE — Assessment & Plan Note (Signed)
Mr. Gloss has known total occlusion of the LAD artery with extensive ischemia in the anteroapical distribution, but has done well on medical therapy for the last 4 years. Probably because of his limited level of physical activity he is completely angina free. He is on appropriate treatment with a statin as well as 2 antianginal medications and aspirin. The dose of aspirin can be reduced to 81 mg daily dose.

## 2013-06-01 NOTE — Assessment & Plan Note (Signed)
His daughter will give [place] a copy of his [place] labs

## 2013-06-19 ENCOUNTER — Encounter: Payer: Self-pay | Admitting: *Deleted

## 2013-06-22 ENCOUNTER — Non-Acute Institutional Stay (SKILLED_NURSING_FACILITY): Payer: Medicare Other | Admitting: Internal Medicine

## 2013-06-22 DIAGNOSIS — I10 Essential (primary) hypertension: Secondary | ICD-10-CM

## 2013-06-22 DIAGNOSIS — N320 Bladder-neck obstruction: Secondary | ICD-10-CM

## 2013-06-22 DIAGNOSIS — K219 Gastro-esophageal reflux disease without esophagitis: Secondary | ICD-10-CM | POA: Diagnosis not present

## 2013-06-22 DIAGNOSIS — E785 Hyperlipidemia, unspecified: Secondary | ICD-10-CM | POA: Diagnosis not present

## 2013-06-22 NOTE — Progress Notes (Signed)
         PROGRESS NOTE  DATE: 06/22/2013  FACILITY: Nursing Home Location: [facility]  LEVEL OF CARE: SNF (31)  Routine Visit  CHIEF COMPLAINT:  Manage hypertension, BPH and hyperlipidemia  HISTORY OF PRESENT ILLNESS:  REASSESSMENT OF ONGOING PROBLEM(S):  BPH: The patient's BPH remains stable. Patient denies urinary hesitancy or dribbling. No complications reported from the current medications being used.  HTN: Pt 's HTN remains stable.  Denies CP, sob, DOE, pedal edema, headaches, dizziness or visual disturbances.  No complications from the medications currently being used.  Last BP : 133/71.  HYPERLIPIDEMIA: No complications from the medications presently being used. Last fasting lipid panel not available.  PAST MEDICAL HISTORY : Reviewed.  No changes/see problem list  CURRENT MEDICATIONS: Reviewed per MAR/see medication list  REVIEW OF SYSTEMS:  GENERAL: no change in appetite, no fatigue, no weight changes, no fever, chills or weakness RESPIRATORY: no cough, SOB, DOE, wheezing, hemoptysis CARDIAC: no chest pain, edema or palpitations GI: no abdominal pain, diarrhea, constipation, heart burn, nausea or vomiting  PHYSICAL EXAMINATION  VS:  See VS section  GENERAL: no acute distress, moderately obese body habitus EYES: conjunctivae normal, sclerae normal, normal eye lids NECK: supple, trachea midline, no neck masses, no thyroid tenderness, no thyromegaly LYMPHATICS: no LAN in the neck, no supraclavicular LAN RESPIRATORY: breathing is even & unlabored, BS CTAB CARDIAC: RRR, no murmur,no extra heart sounds, no edema GI: abdomen soft, normal BS, no masses, no tenderness, no hepatomegaly, no splenomegaly PSYCHIATRIC: the patient is alert & oriented to person, affect & behavior appropriate  LABS/RADIOLOGY: 5-15 TSH 0.353  ASSESSMENT/PLAN:  Hypertension-well-controlled BPH-denies ongoing symptoms Hyperlipidemia-continue Lipitor GERD-continue  PPI Vitamin B12 deficiency-continue supplementation CAD-stable Subclinical hyperthyroidism-TSH normal Discharge planning is in process for next week. Therefore will not do lab work. Followup with primary M.D.  CPT CODE: 60454  Elan Mcelvain Y [place], [place] [phone]

## 2013-06-27 ENCOUNTER — Encounter: Payer: Self-pay | Admitting: Adult Health

## 2013-06-27 ENCOUNTER — Non-Acute Institutional Stay (SKILLED_NURSING_FACILITY): Payer: Medicare Other | Admitting: Adult Health

## 2013-06-27 DIAGNOSIS — K219 Gastro-esophageal reflux disease without esophagitis: Secondary | ICD-10-CM

## 2013-06-27 DIAGNOSIS — I2510 Atherosclerotic heart disease of native coronary artery without angina pectoris: Secondary | ICD-10-CM | POA: Diagnosis not present

## 2013-06-27 DIAGNOSIS — E0590 Thyrotoxicosis, unspecified without thyrotoxic crisis or storm: Secondary | ICD-10-CM | POA: Diagnosis not present

## 2013-06-27 DIAGNOSIS — E785 Hyperlipidemia, unspecified: Secondary | ICD-10-CM

## 2013-06-27 DIAGNOSIS — N320 Bladder-neck obstruction: Secondary | ICD-10-CM

## 2013-06-27 DIAGNOSIS — I10 Essential (primary) hypertension: Secondary | ICD-10-CM

## 2013-06-27 NOTE — Progress Notes (Signed)
Patient ID: [name] [name], male   DOB: 07-20-1925, 80 y.o.   MRN: [id number]              PROGRESS NOTE  DATE:  06/27/13  FACILITY: Nursing Home Location: [facility]  LEVEL OF CARE: SNF (31)  Acute Visit  CHIEF COMPLAINT:  Discharge Notes  HISTORY OF PRESENT ILLNESS: This is an 80 year old male who is for discharge home with Home health PT, OT and Nursing. He has been admitted to [facility] on 04/10/13 from [facility] with Acute viral gastroenteritis (due to rotavirus). Patient was admitted to this facility for short-term rehabilitation after the patient's recent hospitalization.  Patient has completed SNF rehabilitation and therapy has cleared the patient for discharge.   REASSESSMENT OF ONGOING PROBLEM(S):  HTN: Pt 's HTN remains stable.  Denies CP, sob, DOE, pedal edema, headaches, dizziness or visual disturbances.  No complications from the medications currently being used.  Last BP : 136/67  HYPERTHYROIDISM: Patient's hyperthyroidism remains stable.  Patient denies anxiety, irritability or palpitations. No medication being used. 5/15 tsh 0.3530  CAD: The angina has been stable. The patient denies dyspnea on exertion, orthopnea, pedal edema, palpitations and paroxysmal nocturnal dyspnea. No complications noted from the medication presently being used.  PAST MEDICAL HISTORY : Reviewed.  No changes/see problem list  CURRENT MEDICATIONS: Reviewed per MAR/see medication list  REVIEW OF SYSTEMS:  GENERAL: no change in appetite, no fatigue, no weight changes, no fever, chills or weakness RESPIRATORY: no cough, SOB, DOE, wheezing, hemoptysis CARDIAC: no chest pain, edema or palpitations GI: no abdominal pain, diarrhea, constipation, heart burn, nausea or vomiting  PHYSICAL EXAMINATION  GENERAL: no acute distress, normal body habitus NECK: supple, trachea midline, no neck masses, no thyroid tenderness, no thyromegaly LYMPHATICS: no LAN in the neck, no  supraclavicular LAN RESPIRATORY: breathing is even & unlabored, BS CTAB CARDIAC: RRR, no murmur,no extra heart sounds, no edema GI: abdomen soft, normal BS, no masses, no tenderness, no hepatomegaly, no splenomegaly EXTREMITIES:  Able to move all 4 extremities PSYCHIATRIC: the patient is alert & oriented to person, affect & behavior appropriate  LABS/RADIOLOGY: 06/01/13  tsh 0.3530 04/12/13  tsh 1.0740 Labs reviewed: Basic Metabolic Panel:  Recent Labs  03/31/13 1601 04/05/13 0300 04/06/13 0453  NA 140 140 138  K 4.2 3.7 3.6*  CL 101 104 108  CO2 26 20 19   GLUCOSE 135* 128* 84  BUN 28* 30* 15  CREATININE 1.04 1.14 0.91  CALCIUM 9.6 9.3 8.1*   Liver Function Tests:  Recent Labs  04/05/13 0300  AST 22  ALT 14  ALKPHOS 71  BILITOT 0.4  PROT 7.0  ALBUMIN 3.5   CBC:  Recent Labs  03/31/13 1601 04/05/13 0300  WBC 7.5 6.2  NEUTROABS 5.2 4.6  HGB 16.2 16.2  HCT 47.2 46.5  MCV 93.3 92.4  PLT 194 175    ASSESSMENT/PLAN:  Hypertension - well-controlled; continue Lopressor, Norvasc and Zestoretic Bladder Outlet Obstruction - continue Flomax and Proscar GERD - stable; continue Protonix CAD - stable; continue ASA Hyperthyroidism, sbclinical -  tsh 0.3530 - normal Hyperlipidemia - continue Lipitor   I have filled out patient's discharge paperwork and written prescriptions.  Patient will receive home health PT, OT and Nursing.  Total discharge time: Less than 30 minutes  Discharge time involved coordination of the discharge process with Education officer, museum, nursing staff and therapy department. Medical justification for home health services verified.    CPT CODE: 29562  [facility]  Louretta Parma - NP [facility] [phone]

## 2013-07-03 DIAGNOSIS — I10 Essential (primary) hypertension: Secondary | ICD-10-CM | POA: Diagnosis not present

## 2013-07-03 DIAGNOSIS — R269 Unspecified abnormalities of gait and mobility: Secondary | ICD-10-CM | POA: Diagnosis not present

## 2013-07-03 DIAGNOSIS — M6281 Muscle weakness (generalized): Secondary | ICD-10-CM | POA: Diagnosis not present

## 2013-07-03 DIAGNOSIS — M4800 Spinal stenosis, site unspecified: Secondary | ICD-10-CM | POA: Diagnosis not present

## 2013-07-03 DIAGNOSIS — I2510 Atherosclerotic heart disease of native coronary artery without angina pectoris: Secondary | ICD-10-CM | POA: Diagnosis not present

## 2013-07-03 DIAGNOSIS — Z9181 History of falling: Secondary | ICD-10-CM | POA: Diagnosis not present

## 2013-07-04 ENCOUNTER — Emergency Department (HOSPITAL_COMMUNITY): Payer: Medicare Other

## 2013-07-04 ENCOUNTER — Encounter (HOSPITAL_COMMUNITY): Payer: Self-pay | Admitting: Emergency Medicine

## 2013-07-04 ENCOUNTER — Emergency Department (HOSPITAL_COMMUNITY)
Admission: EM | Admit: 2013-07-04 | Discharge: 2013-07-05 | Disposition: A | Discharge status: Home / Self Care | Payer: Medicare Other | Attending: Emergency Medicine | Admitting: Emergency Medicine

## 2013-07-04 DIAGNOSIS — M25559 Pain in unspecified hip: Secondary | ICD-10-CM | POA: Diagnosis not present

## 2013-07-04 DIAGNOSIS — Y939 Activity, unspecified: Secondary | ICD-10-CM | POA: Insufficient documentation

## 2013-07-04 DIAGNOSIS — S79929A Unspecified injury of unspecified thigh, initial encounter: Secondary | ICD-10-CM | POA: Diagnosis not present

## 2013-07-04 DIAGNOSIS — M810 Age-related osteoporosis without current pathological fracture: Secondary | ICD-10-CM | POA: Insufficient documentation

## 2013-07-04 DIAGNOSIS — M129 Arthropathy, unspecified: Secondary | ICD-10-CM | POA: Insufficient documentation

## 2013-07-04 DIAGNOSIS — Z79899 Other long term (current) drug therapy: Secondary | ICD-10-CM | POA: Insufficient documentation

## 2013-07-04 DIAGNOSIS — S7000XA Contusion of unspecified hip, initial encounter: Secondary | ICD-10-CM | POA: Insufficient documentation

## 2013-07-04 DIAGNOSIS — Z862 Personal history of diseases of the blood and blood-forming organs and certain disorders involving the immune mechanism: Secondary | ICD-10-CM | POA: Diagnosis not present

## 2013-07-04 DIAGNOSIS — W19XXXA Unspecified fall, initial encounter: Secondary | ICD-10-CM

## 2013-07-04 DIAGNOSIS — Z8669 Personal history of other diseases of the nervous system and sense organs: Secondary | ICD-10-CM | POA: Diagnosis not present

## 2013-07-04 DIAGNOSIS — K219 Gastro-esophageal reflux disease without esophagitis: Secondary | ICD-10-CM | POA: Insufficient documentation

## 2013-07-04 DIAGNOSIS — Z87891 Personal history of nicotine dependence: Secondary | ICD-10-CM | POA: Insufficient documentation

## 2013-07-04 DIAGNOSIS — W1809XA Striking against other object with subsequent fall, initial encounter: Secondary | ICD-10-CM | POA: Insufficient documentation

## 2013-07-04 DIAGNOSIS — Y9289 Other specified places as the place of occurrence of the external cause: Secondary | ICD-10-CM | POA: Insufficient documentation

## 2013-07-04 DIAGNOSIS — I10 Essential (primary) hypertension: Secondary | ICD-10-CM | POA: Diagnosis not present

## 2013-07-04 DIAGNOSIS — Z8639 Personal history of other endocrine, nutritional and metabolic disease: Secondary | ICD-10-CM | POA: Insufficient documentation

## 2013-07-04 DIAGNOSIS — S7002XA Contusion of left hip, initial encounter: Secondary | ICD-10-CM

## 2013-07-04 DIAGNOSIS — Z7982 Long term (current) use of aspirin: Secondary | ICD-10-CM | POA: Insufficient documentation

## 2013-07-04 DIAGNOSIS — S79919A Unspecified injury of unspecified hip, initial encounter: Secondary | ICD-10-CM | POA: Diagnosis not present

## 2013-07-04 HISTORY — DX: Polyneuropathy, unspecified: G62.9

## 2013-07-04 LAB — CBC WITH DIFFERENTIAL/PLATELET
Basophils Absolute: 0 10*3/uL (ref 0.0–0.1)
Basophils Relative: 0 % (ref 0–1)
EOS ABS: 0.1 10*3/uL (ref 0.0–0.7)
Eosinophils Relative: 1 % (ref 0–5)
HCT: 40.9 % (ref 39.0–52.0)
HEMOGLOBIN: 13.6 g/dL (ref 13.0–17.0)
LYMPHS ABS: 1.7 10*3/uL (ref 0.7–4.0)
LYMPHS PCT: 19 % (ref 12–46)
MCH: 29.9 pg (ref 26.0–34.0)
MCHC: 33.3 g/dL (ref 30.0–36.0)
MCV: 89.9 fL (ref 78.0–100.0)
MONOS PCT: 7 % (ref 3–12)
Monocytes Absolute: 0.6 10*3/uL (ref 0.1–1.0)
NEUTROS ABS: 6.7 10*3/uL (ref 1.7–7.7)
Neutrophils Relative %: 73 % (ref 43–77)
PLATELETS: 214 10*3/uL (ref 150–400)
RBC: 4.55 MIL/uL (ref 4.22–5.81)
RDW: 13.8 % (ref 11.5–15.5)
WBC: 9.1 10*3/uL (ref 4.0–10.5)

## 2013-07-04 LAB — BASIC METABOLIC PANEL
BUN: 27 mg/dL — AB (ref 6–23)
CHLORIDE: 105 meq/L (ref 96–112)
CO2: 24 mEq/L (ref 19–32)
Calcium: 9.5 mg/dL (ref 8.4–10.5)
Creatinine, Ser: 1.23 mg/dL (ref 0.50–1.35)
GFR calc Af Amer: 59 mL/min — ABNORMAL LOW (ref >90)
GFR calc non Af Amer: 51 mL/min — ABNORMAL LOW (ref >90)
GLUCOSE: 126 mg/dL — AB (ref 70–99)
POTASSIUM: 3.9 meq/L (ref 3.7–5.3)
SODIUM: 141 meq/L (ref 137–147)

## 2013-07-04 MED ORDER — TRAMADOL HCL 50 MG PO TABS: 50.0000 mg | ORAL_TABLET | Freq: Four times a day (QID) | ORAL | Status: DC | PRN

## 2013-07-04 MED ORDER — TRAMADOL HCL 50 MG PO TABS
50.0000 mg | ORAL_TABLET | Freq: Once | ORAL | Status: AC
  Administered 2013-07-04: 50 mg via ORAL
  Filled 2013-07-04: qty 1

## 2013-07-04 NOTE — ED Notes (Signed)
Per pt report: pt was at the pool today and loss balance and fell. Pt denies hitting his head or LOC. Pt reports falling on his left leg. Pt reports normally having balancing problems. Pt normally in a wheelchair but is able to shuffle slightly with the aid of the walker.  Pt reports not being able to bear weight on leg.  Pt a/o x 4.  Skin warm and dry.

## 2013-07-04 NOTE — ED Notes (Signed)
Pt and family reminded of the need for urine, however pt is still unable to go.

## 2013-07-04 NOTE — ED Provider Notes (Signed)
CSN: [id number]     Arrival date & time 07/04/13  1908 History   First MD Initiated Contact with Patient 07/04/13 2014     Chief Complaint  Patient presents with  . Fall     (Consider location/radiation/quality/duration/timing/severity/associated sxs/prior Treatment) Patient is a 80 y.o. male presenting with fall. The history is provided by the patient.  Fall This is a new problem. The current episode started 3 to 5 hours ago. Episode frequency: once. The problem has not changed since onset.Pertinent negatives include no chest pain, no abdominal pain, no headaches and no shortness of breath. Nothing aggravates the symptoms. Nothing relieves the symptoms. He has tried acetaminophen for the symptoms. The treatment provided mild relief.    Past Medical History  Diagnosis Date  . Arthritis   . Hypertension   . Osteoporosis   . GERD (gastroesophageal reflux disease)   . Thyroid disease     Hyperthyroidism  . Neuropathy    Past Surgical History  Procedure Laterality Date  . Spine surgery     No family history on file. History  Substance Use Topics  . Smoking status: Former Research scientist (life sciences)  . Smokeless tobacco: Not on file  . Alcohol Use: No    Review of Systems  Constitutional: Negative for fever.  HENT: Negative for drooling and rhinorrhea.   Eyes: Negative for pain.  Respiratory: Negative for cough and shortness of breath.   Cardiovascular: Negative for chest pain and leg swelling.  Gastrointestinal: Negative for nausea, vomiting, abdominal pain and diarrhea.  Genitourinary: Negative for dysuria and hematuria.  Musculoskeletal: Negative for gait problem and neck pain.  Skin: Negative for color change.  Neurological: Negative for numbness and headaches.  Hematological: Negative for adenopathy.  Psychiatric/Behavioral: Negative for behavioral problems.  All other systems reviewed and are negative.     Allergies  Cortisone  Home Medications   Prior to Admission medications    Medication Sig Start Date End Date Taking? Authorizing Provider  acetaminophen (TYLENOL) 500 MG tablet Take 1,000 mg by mouth every 6 (six) hours as needed (pain).   Yes Historical Provider, MD  amLODipine (NORVASC) 10 MG tablet Take 5 mg by mouth daily. Take 1/2 tablet For HTN   Yes Historical Provider, MD  aspirin 81 MG chewable tablet Chew 81 mg by mouth daily.   Yes Historical Provider, MD  atorvastatin (LIPITOR) 10 MG tablet Take 10 mg by mouth daily.   Yes Historical Provider, MD  calcium-vitamin D (OSCAL WITH D) 500-200 MG-UNIT per tablet Take 2 tablets by mouth daily with breakfast.   Yes Historical Provider, MD  finasteride (PROSCAR) 5 MG tablet Take 5 mg by mouth daily.   Yes Historical Provider, MD  fish oil-omega-3 fatty acids 1000 MG capsule Take 2 g by mouth daily.   Yes Historical Provider, MD  lisinopril-hydrochlorothiazide (PRINZIDE,ZESTORETIC) 20-12.5 MG per tablet Take 1 tablet by mouth 2 (two) times daily.   Yes Historical Provider, MD  metoprolol (LOPRESSOR) 100 MG tablet Take 100 mg by mouth 2 (two) times daily.   Yes Historical Provider, MD  pantoprazole (PROTONIX) 40 MG tablet Take 1 tablet (40 mg total) by mouth daily. 04/10/13  Yes Nishant Dhungel, MD  tamsulosin (FLOMAX) 0.4 MG CAPS capsule Take 1 capsule (0.4 mg total) by mouth daily after supper. 04/10/13  Yes Nishant Dhungel, MD   BP 113/62  Pulse 78  Temp(Src) 97.7 F (36.5 C) (Oral)  Resp 18  Ht 5\' 11"  (1.803 m)  Wt 194 lb (87.998 kg)  BMI 27.07 kg/m2  SpO2 95% Physical Exam  Nursing note and vitals reviewed. Constitutional: He is oriented to person, place, and time. He appears well-developed and well-nourished.  HENT:  Head: Normocephalic and atraumatic.  Right Ear: External ear normal.  Left Ear: External ear normal.  Nose: Nose normal.  Mouth/Throat: Oropharynx is clear and moist. No oropharyngeal exudate.  Eyes: Conjunctivae and EOM are normal. Pupils are equal, round, and reactive to light.  Neck:  Normal range of motion. Neck supple.  No vertebral tenderness to palpation.  Cardiovascular: Normal rate, regular rhythm, normal heart sounds and intact distal pulses.  Exam reveals no gallop and no friction rub.   No murmur heard. Pulmonary/Chest: Effort normal and breath sounds normal. No respiratory distress. He has no wheezes.  Abdominal: Soft. Bowel sounds are normal. He exhibits no distension. There is no tenderness. There is no rebound and no guarding.  Musculoskeletal: Normal range of motion. He exhibits no edema and no tenderness.  Normal range of motion of the right lower term and hip without pain.  Moderate tenderness to palpation of the left lateral hip and proximal lateral thigh. Sensation intact diffusely in the left lower extremity.Normal rom of left ankle/foot/knee.   2+ distal pulses in the lower extremities.  No vertebral tenderness to palpatio  Neurological: He is alert and oriented to person, place, and time.  Skin: Skin is warm and dry.  Psychiatric: He has a normal mood and affect. His behavior is normal.    ED Course  Procedures (including critical care time) Labs Review Labs Reviewed  BASIC METABOLIC PANEL - Abnormal; Notable for the following:    Glucose, Bld 126 (*)    BUN 27 (*)    GFR calc non Af Amer 51 (*)    GFR calc Af Amer 59 (*)    All other components within normal limits  CBC WITH DIFFERENTIAL    Imaging Review Dg Hip Complete Left  07/04/2013   CLINICAL DATA:  Pale today with left hip pain down to the mid femur.  EXAM: LEFT HIP - COMPLETE 2+ VIEW  COMPARISON:  None.  FINDINGS: There is no evidence of hip fracture or dislocation. Degenerative changes in the left hip with narrowing and sclerosis of the superior acetabular joint and hypertrophic changes on the femoral head. There is no evidence of focal bone abnormality. Degenerative changes in the lower lumbar spine.  IMPRESSION: Degenerative changes.  No acute bony abnormalities.   Electronically  Signed   By: Lucienne Capers M.D.   On: 07/04/2013 21:16   Dg Femur Left  07/04/2013   CLINICAL DATA:  [place] today, pain from LEFT hip down to mid femur  EXAM: LEFT FEMUR - 2 VIEW  COMPARISON:  None  FINDINGS: Osseous demineralization.  Joint space narrowing medial compartment LEFT knee.  Hip joint space preserved.  Visualized pelvis intact.  No acute fracture, dislocation or bone destruction.  No knee joint effusion.  IMPRESSION: Osseous demineralization with degenerative changes LEFT knee.  No acute osseous abnormalities.   Electronically Signed   By: Lavonia Dana M.D.   On: 07/04/2013 21:17   Ct Hip Left Wo Contrast  07/04/2013   CLINICAL DATA:  Fall.  Increasing left hip pain.  EXAM: CT OF THE LEFT HIP WITHOUT CONTRAST  TECHNIQUE: Multidetector CT imaging was performed according to the standard protocol. Multiplanar CT image reconstructions were also generated.  COMPARISON:  None.  FINDINGS: Spurring of left femoral head. Faint calcification along the joint margins raising  the possibility of CPPD arthropathy. No well-defined bony discontinuity in the left proximal femur. Regional pelvis appears intact.  There is greater density than out expect in the quadriceps musculature given the patient's age, most notably in the vastus intermedius. I suspect that there is probably some intramuscular hematoma.  IMPRESSION: 1. Prominence of the upper quadriceps musculature makes me suspect a vastus intermedius muscular hematoma, but we only image the very top of this process. 2. 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   Electronically Signed   By: Sherryl Barters M.D.   On: 07/04/2013 23:11     EKG Interpretation   Date/Time:  Tuesday July 04 2013 20:35:32 EDT Ventricular Rate:  77 PR Interval:  189 QRS Duration: 136 QT Interval:  407 QTC Calculation: 461 R Axis:   31 Text Interpretation:  Sinus rhythm Right bundle branch block No  significant change since last tracing Confirmed by  HARRISON  MD, Sandy  ([id number]) on 07/04/2013 8:59:31 PM      MDM   Final diagnoses:  Fall, initial encounter  Contusion of left hip, initial encounter    8:59 PM 80 y.o. male who presents with a mechanical fall which occurred earlier today. He fell into a wall hitting his left hip and then rolled to the ground. He did not hit his head or lose consciousness. The fall was witnessed by his family. Pt unstable at baseline and ambulates w/ a walker. Denies HA. He is afebrile and vital signs are unremarkable here. Will get screening imaging and labs.  11:33 PM: No fx on plain film or CT. Possible hematoma noted on CT. Pt's pain has improved w/ tramadol. He is able to ambulate w/ a walker. Had care mgmt speak w/ family who is getting home health. Family feels comfortable taking pt home.  I have discussed the diagnosis/risks/treatment options with the patient and family and believe the pt to be eligible for discharge home to follow-up with pcp as needed. We also discussed returning to the ED immediately if new or worsening sx occur. We discussed the sx which are most concerning (e.g., worsening pain, fever, recurrent falls) that necessitate immediate return. Medications administered to the patient during their visit and any new prescriptions provided to the patient are listed below.  Medications given during this visit Medications  traMADol (ULTRAM) tablet 50 mg (50 mg Oral Given 07/04/13 2030)    New Prescriptions   TRAMADOL (ULTRAM) 50 MG TABLET    Take 1 tablet (50 mg total) by mouth every 6 (six) hours as needed.     Blanchard Kelch, MD 07/05/13 [phone]

## 2013-07-04 NOTE — ED Notes (Signed)
Pt is aware of the need for urine, urinal at bedside. Family at bedside to help remind and assist.

## 2013-07-04 NOTE — Progress Notes (Signed)
  CARE MANAGEMENT ED NOTE 07/04/2013  Patient:  [name] [name], [name] [name]   Account Number:  [id number]  Date Initiated:  07/04/2013  Documentation initiated by:  Livia Snellen  Subjective/Objective Assessment:   Patient presents to Ed post fall with pain in left leg     Subjective/Objective Assessment Detail:     Action/Plan:   Action/Plan Detail:   Anticipated DC Date:       Status Recommendation to Physician:   Result of Recommendation:    Other ED Services  Consult Working [place]  Other    Choice offered to / List presented to:  C-4 Adult Children          Status of service:  Completed, signed off  ED Comments:   ED Comments Detail:  EDCM spoke to patient and his daughters at bedside.  [name] [name] [phone] patient's POA.  And patient's other daughter who is currently not at bedside [name] [name] [phone] and work [phone].  Patient lives at home alone.  Patient's home is one level bathroom, bedroom kitchen is allon one floor.  Patient confirms he is currently receiving home health services from [place], [place], [place], OT, but has not started services yet. Visiting RN, PT came yesterday.  Patient's daughter reports, the patient is able to perform all of his ADL's without difficulty from his wheelchair.  Patient does not have a wheelchair ramp on his house and refuses to get one.  Patient's daughters report the patient stated, "If I have to get a ramp on the house, I'm going into an ALF."  Patient's daughters report patient was looking through ALF brochures yesterday. Patient's daughter reports if patient is discharged, she will stay with him for a few days.  Patient's daughters are concerned as patient still walks down the steps with one arm on railing and his walker in the other arm.  EDCM provided patient's daughters with home health agency list of [place].  EDCM also provided patient with list of private duty nursing agencies.  Patient's  family thankful for assistance.  No further EDCM needs at this time.

## 2013-07-04 NOTE — Discharge Instructions (Signed)
Contusion °A contusion is a deep bruise. Contusions are the result of an injury that caused bleeding under the skin. The contusion may turn blue, purple, or yellow. Minor injuries will give you a painless contusion, but more severe contusions may stay painful and swollen for a few weeks.  °CAUSES  °A contusion is usually caused by a blow, trauma, or direct force to an area of the body. °SYMPTOMS  °· Swelling and redness of the injured area. °· Bruising of the injured area. °· Tenderness and soreness of the injured area. °· Pain. °DIAGNOSIS  °The diagnosis can be made by taking a history and physical exam. An X-ray, CT scan, or MRI may be needed to determine if there were any associated injuries, such as fractures. °TREATMENT  °Specific treatment will depend on what area of the body was injured. In general, the best treatment for a contusion is resting, icing, elevating, and applying cold compresses to the injured area. Over-the-counter medicines may also be recommended for pain control. Ask your caregiver what the best treatment is for your contusion. °HOME CARE INSTRUCTIONS  °· Put ice on the injured area. °¨ Put ice in a plastic bag. °¨ Place a towel between your skin and the bag. °¨ Leave the ice on for 15-20 minutes, 3-4 times a day, or as directed by your health care provider. °· Only take over-the-counter or prescription medicines for pain, discomfort, or fever as directed by your caregiver. Your caregiver may recommend avoiding anti-inflammatory medicines (aspirin, ibuprofen, and naproxen) for 48 hours because these medicines may increase bruising. °· Rest the injured area. °· If possible, elevate the injured area to reduce swelling. °SEEK IMMEDIATE MEDICAL CARE IF:  °· You have increased bruising or swelling. °· You have pain that is getting worse. °· Your swelling or pain is not relieved with medicines. °MAKE SURE YOU:  °· Understand these instructions. °· Will watch your condition. °· Will get help right  away if you are not doing well or get worse. °Document Released: 10/08/2004 Document Revised: 01/03/2013 Document Reviewed: 11/03/2010 °ExitCare® Patient Information ©2015 [employer]. This information is not intended to replace advice given to you by your health care provider. Make sure you discuss any questions you have with your health care provider. ° °

## 2013-07-05 DIAGNOSIS — M79609 Pain in unspecified limb: Secondary | ICD-10-CM | POA: Diagnosis not present

## 2013-07-05 DIAGNOSIS — T148XXA Other injury of unspecified body region, initial encounter: Secondary | ICD-10-CM | POA: Diagnosis not present

## 2013-07-05 NOTE — Progress Notes (Signed)
07/04/2013 12 md A. Keyes Patient for discharge home.  Xrays without fractures.  Patient wheelchair bound at home. Patient able to stand with walker at bedside and take a few steps in his room.  Patient currently receiving home health services with [place].  Patient's daughter reports she will be staying with him this evening and family will take turns staying with the patient.  Patient's daughterrequesting wheelchait ramp be orderd.  EDCM placed order for wheelchair ramp to [facility] at 0005am with confirmation of receipt at 0008am.  No further EDCM needs at this time.

## 2013-08-01 DIAGNOSIS — M25559 Pain in unspecified hip: Secondary | ICD-10-CM | POA: Diagnosis not present

## 2013-09-01 DIAGNOSIS — M810 Age-related osteoporosis without current pathological fracture: Secondary | ICD-10-CM | POA: Diagnosis not present

## 2013-09-01 DIAGNOSIS — M1990 Unspecified osteoarthritis, unspecified site: Secondary | ICD-10-CM | POA: Diagnosis not present

## 2013-09-01 DIAGNOSIS — R260 Ataxic gait: Secondary | ICD-10-CM | POA: Diagnosis not present

## 2013-09-01 DIAGNOSIS — I2510 Atherosclerotic heart disease of native coronary artery without angina pectoris: Secondary | ICD-10-CM | POA: Diagnosis not present

## 2013-09-01 DIAGNOSIS — I10 Essential (primary) hypertension: Secondary | ICD-10-CM | POA: Diagnosis not present

## 2013-09-01 DIAGNOSIS — G629 Polyneuropathy, unspecified: Secondary | ICD-10-CM | POA: Diagnosis not present

## 2013-09-04 DIAGNOSIS — I10 Essential (primary) hypertension: Secondary | ICD-10-CM | POA: Diagnosis not present

## 2013-09-04 DIAGNOSIS — M1990 Unspecified osteoarthritis, unspecified site: Secondary | ICD-10-CM | POA: Diagnosis not present

## 2013-09-04 DIAGNOSIS — M810 Age-related osteoporosis without current pathological fracture: Secondary | ICD-10-CM | POA: Diagnosis not present

## 2013-09-04 DIAGNOSIS — I2510 Atherosclerotic heart disease of native coronary artery without angina pectoris: Secondary | ICD-10-CM | POA: Diagnosis not present

## 2013-09-04 DIAGNOSIS — R260 Ataxic gait: Secondary | ICD-10-CM | POA: Diagnosis not present

## 2013-09-04 DIAGNOSIS — G629 Polyneuropathy, unspecified: Secondary | ICD-10-CM | POA: Diagnosis not present

## 2013-09-06 DIAGNOSIS — I2510 Atherosclerotic heart disease of native coronary artery without angina pectoris: Secondary | ICD-10-CM | POA: Diagnosis not present

## 2013-09-06 DIAGNOSIS — G629 Polyneuropathy, unspecified: Secondary | ICD-10-CM | POA: Diagnosis not present

## 2013-09-06 DIAGNOSIS — M810 Age-related osteoporosis without current pathological fracture: Secondary | ICD-10-CM | POA: Diagnosis not present

## 2013-09-06 DIAGNOSIS — I10 Essential (primary) hypertension: Secondary | ICD-10-CM | POA: Diagnosis not present

## 2013-09-06 DIAGNOSIS — R260 Ataxic gait: Secondary | ICD-10-CM | POA: Diagnosis not present

## 2013-09-06 DIAGNOSIS — M1990 Unspecified osteoarthritis, unspecified site: Secondary | ICD-10-CM | POA: Diagnosis not present

## 2013-09-11 DIAGNOSIS — M1990 Unspecified osteoarthritis, unspecified site: Secondary | ICD-10-CM | POA: Diagnosis not present

## 2013-09-11 DIAGNOSIS — M810 Age-related osteoporosis without current pathological fracture: Secondary | ICD-10-CM | POA: Diagnosis not present

## 2013-09-11 DIAGNOSIS — I2510 Atherosclerotic heart disease of native coronary artery without angina pectoris: Secondary | ICD-10-CM | POA: Diagnosis not present

## 2013-09-11 DIAGNOSIS — R260 Ataxic gait: Secondary | ICD-10-CM | POA: Diagnosis not present

## 2013-09-11 DIAGNOSIS — G629 Polyneuropathy, unspecified: Secondary | ICD-10-CM | POA: Diagnosis not present

## 2013-09-11 DIAGNOSIS — I10 Essential (primary) hypertension: Secondary | ICD-10-CM | POA: Diagnosis not present

## 2013-09-13 DIAGNOSIS — R260 Ataxic gait: Secondary | ICD-10-CM | POA: Diagnosis not present

## 2013-09-13 DIAGNOSIS — M1990 Unspecified osteoarthritis, unspecified site: Secondary | ICD-10-CM | POA: Diagnosis not present

## 2013-09-13 DIAGNOSIS — G629 Polyneuropathy, unspecified: Secondary | ICD-10-CM | POA: Diagnosis not present

## 2013-09-13 DIAGNOSIS — I10 Essential (primary) hypertension: Secondary | ICD-10-CM | POA: Diagnosis not present

## 2013-09-13 DIAGNOSIS — I2510 Atherosclerotic heart disease of native coronary artery without angina pectoris: Secondary | ICD-10-CM | POA: Diagnosis not present

## 2013-09-13 DIAGNOSIS — M810 Age-related osteoporosis without current pathological fracture: Secondary | ICD-10-CM | POA: Diagnosis not present

## 2013-09-19 DIAGNOSIS — I2510 Atherosclerotic heart disease of native coronary artery without angina pectoris: Secondary | ICD-10-CM | POA: Diagnosis not present

## 2013-09-19 DIAGNOSIS — R260 Ataxic gait: Secondary | ICD-10-CM | POA: Diagnosis not present

## 2013-09-19 DIAGNOSIS — I10 Essential (primary) hypertension: Secondary | ICD-10-CM | POA: Diagnosis not present

## 2013-09-19 DIAGNOSIS — G629 Polyneuropathy, unspecified: Secondary | ICD-10-CM | POA: Diagnosis not present

## 2013-09-19 DIAGNOSIS — M810 Age-related osteoporosis without current pathological fracture: Secondary | ICD-10-CM | POA: Diagnosis not present

## 2013-09-19 DIAGNOSIS — M1990 Unspecified osteoarthritis, unspecified site: Secondary | ICD-10-CM | POA: Diagnosis not present

## 2013-09-21 DIAGNOSIS — G629 Polyneuropathy, unspecified: Secondary | ICD-10-CM | POA: Diagnosis not present

## 2013-09-21 DIAGNOSIS — M1990 Unspecified osteoarthritis, unspecified site: Secondary | ICD-10-CM | POA: Diagnosis not present

## 2013-09-21 DIAGNOSIS — I2510 Atherosclerotic heart disease of native coronary artery without angina pectoris: Secondary | ICD-10-CM | POA: Diagnosis not present

## 2013-09-21 DIAGNOSIS — I10 Essential (primary) hypertension: Secondary | ICD-10-CM | POA: Diagnosis not present

## 2013-09-21 DIAGNOSIS — R260 Ataxic gait: Secondary | ICD-10-CM | POA: Diagnosis not present

## 2013-09-21 DIAGNOSIS — M810 Age-related osteoporosis without current pathological fracture: Secondary | ICD-10-CM | POA: Diagnosis not present

## 2013-09-26 DIAGNOSIS — I2510 Atherosclerotic heart disease of native coronary artery without angina pectoris: Secondary | ICD-10-CM | POA: Diagnosis not present

## 2013-09-26 DIAGNOSIS — I10 Essential (primary) hypertension: Secondary | ICD-10-CM | POA: Diagnosis not present

## 2013-09-26 DIAGNOSIS — G629 Polyneuropathy, unspecified: Secondary | ICD-10-CM | POA: Diagnosis not present

## 2013-09-26 DIAGNOSIS — M810 Age-related osteoporosis without current pathological fracture: Secondary | ICD-10-CM | POA: Diagnosis not present

## 2013-09-26 DIAGNOSIS — M1990 Unspecified osteoarthritis, unspecified site: Secondary | ICD-10-CM | POA: Diagnosis not present

## 2013-09-26 DIAGNOSIS — R260 Ataxic gait: Secondary | ICD-10-CM | POA: Diagnosis not present

## 2013-09-27 DIAGNOSIS — M1990 Unspecified osteoarthritis, unspecified site: Secondary | ICD-10-CM | POA: Diagnosis not present

## 2013-09-27 DIAGNOSIS — IMO0001 Reserved for inherently not codable concepts without codable children: Secondary | ICD-10-CM | POA: Diagnosis not present

## 2013-09-27 DIAGNOSIS — I10 Essential (primary) hypertension: Secondary | ICD-10-CM | POA: Diagnosis not present

## 2013-09-27 DIAGNOSIS — R260 Ataxic gait: Secondary | ICD-10-CM | POA: Diagnosis not present

## 2013-09-27 DIAGNOSIS — M810 Age-related osteoporosis without current pathological fracture: Secondary | ICD-10-CM | POA: Diagnosis not present

## 2013-09-27 DIAGNOSIS — G629 Polyneuropathy, unspecified: Secondary | ICD-10-CM | POA: Diagnosis not present

## 2013-09-27 DIAGNOSIS — R269 Unspecified abnormalities of gait and mobility: Secondary | ICD-10-CM | POA: Diagnosis not present

## 2013-09-27 DIAGNOSIS — I2510 Atherosclerotic heart disease of native coronary artery without angina pectoris: Secondary | ICD-10-CM | POA: Diagnosis not present

## 2013-10-02 DIAGNOSIS — M810 Age-related osteoporosis without current pathological fracture: Secondary | ICD-10-CM | POA: Diagnosis not present

## 2013-10-02 DIAGNOSIS — R260 Ataxic gait: Secondary | ICD-10-CM | POA: Diagnosis not present

## 2013-10-02 DIAGNOSIS — M1990 Unspecified osteoarthritis, unspecified site: Secondary | ICD-10-CM | POA: Diagnosis not present

## 2013-10-02 DIAGNOSIS — I2510 Atherosclerotic heart disease of native coronary artery without angina pectoris: Secondary | ICD-10-CM | POA: Diagnosis not present

## 2013-10-02 DIAGNOSIS — I10 Essential (primary) hypertension: Secondary | ICD-10-CM | POA: Diagnosis not present

## 2013-10-02 DIAGNOSIS — G629 Polyneuropathy, unspecified: Secondary | ICD-10-CM | POA: Diagnosis not present

## 2013-10-04 DIAGNOSIS — M810 Age-related osteoporosis without current pathological fracture: Secondary | ICD-10-CM | POA: Diagnosis not present

## 2013-10-04 DIAGNOSIS — I2510 Atherosclerotic heart disease of native coronary artery without angina pectoris: Secondary | ICD-10-CM | POA: Diagnosis not present

## 2013-10-04 DIAGNOSIS — I10 Essential (primary) hypertension: Secondary | ICD-10-CM | POA: Diagnosis not present

## 2013-10-04 DIAGNOSIS — R260 Ataxic gait: Secondary | ICD-10-CM | POA: Diagnosis not present

## 2013-10-04 DIAGNOSIS — M1990 Unspecified osteoarthritis, unspecified site: Secondary | ICD-10-CM | POA: Diagnosis not present

## 2013-10-04 DIAGNOSIS — G629 Polyneuropathy, unspecified: Secondary | ICD-10-CM | POA: Diagnosis not present

## 2013-10-09 DIAGNOSIS — R260 Ataxic gait: Secondary | ICD-10-CM | POA: Diagnosis not present

## 2013-10-09 DIAGNOSIS — I10 Essential (primary) hypertension: Secondary | ICD-10-CM | POA: Diagnosis not present

## 2013-10-09 DIAGNOSIS — M810 Age-related osteoporosis without current pathological fracture: Secondary | ICD-10-CM | POA: Diagnosis not present

## 2013-10-09 DIAGNOSIS — M1990 Unspecified osteoarthritis, unspecified site: Secondary | ICD-10-CM | POA: Diagnosis not present

## 2013-10-09 DIAGNOSIS — G629 Polyneuropathy, unspecified: Secondary | ICD-10-CM | POA: Diagnosis not present

## 2013-10-09 DIAGNOSIS — I2510 Atherosclerotic heart disease of native coronary artery without angina pectoris: Secondary | ICD-10-CM | POA: Diagnosis not present

## 2013-10-11 DIAGNOSIS — I10 Essential (primary) hypertension: Secondary | ICD-10-CM | POA: Diagnosis not present

## 2013-10-11 DIAGNOSIS — G629 Polyneuropathy, unspecified: Secondary | ICD-10-CM | POA: Diagnosis not present

## 2013-10-11 DIAGNOSIS — M1990 Unspecified osteoarthritis, unspecified site: Secondary | ICD-10-CM | POA: Diagnosis not present

## 2013-10-11 DIAGNOSIS — R260 Ataxic gait: Secondary | ICD-10-CM | POA: Diagnosis not present

## 2013-10-11 DIAGNOSIS — M810 Age-related osteoporosis without current pathological fracture: Secondary | ICD-10-CM | POA: Diagnosis not present

## 2013-10-11 DIAGNOSIS — I2510 Atherosclerotic heart disease of native coronary artery without angina pectoris: Secondary | ICD-10-CM | POA: Diagnosis not present

## 2013-10-16 DIAGNOSIS — R260 Ataxic gait: Secondary | ICD-10-CM | POA: Diagnosis not present

## 2013-10-16 DIAGNOSIS — G629 Polyneuropathy, unspecified: Secondary | ICD-10-CM | POA: Diagnosis not present

## 2013-10-16 DIAGNOSIS — M810 Age-related osteoporosis without current pathological fracture: Secondary | ICD-10-CM | POA: Diagnosis not present

## 2013-10-16 DIAGNOSIS — I2510 Atherosclerotic heart disease of native coronary artery without angina pectoris: Secondary | ICD-10-CM | POA: Diagnosis not present

## 2013-10-16 DIAGNOSIS — I10 Essential (primary) hypertension: Secondary | ICD-10-CM | POA: Diagnosis not present

## 2013-10-16 DIAGNOSIS — M1990 Unspecified osteoarthritis, unspecified site: Secondary | ICD-10-CM | POA: Diagnosis not present

## 2013-10-18 DIAGNOSIS — I10 Essential (primary) hypertension: Secondary | ICD-10-CM | POA: Diagnosis not present

## 2013-10-18 DIAGNOSIS — I2510 Atherosclerotic heart disease of native coronary artery without angina pectoris: Secondary | ICD-10-CM | POA: Diagnosis not present

## 2013-10-18 DIAGNOSIS — R260 Ataxic gait: Secondary | ICD-10-CM | POA: Diagnosis not present

## 2013-10-18 DIAGNOSIS — M810 Age-related osteoporosis without current pathological fracture: Secondary | ICD-10-CM | POA: Diagnosis not present

## 2013-10-18 DIAGNOSIS — G629 Polyneuropathy, unspecified: Secondary | ICD-10-CM | POA: Diagnosis not present

## 2013-10-18 DIAGNOSIS — M1990 Unspecified osteoarthritis, unspecified site: Secondary | ICD-10-CM | POA: Diagnosis not present

## 2013-10-23 DIAGNOSIS — I2510 Atherosclerotic heart disease of native coronary artery without angina pectoris: Secondary | ICD-10-CM | POA: Diagnosis not present

## 2013-10-23 DIAGNOSIS — M810 Age-related osteoporosis without current pathological fracture: Secondary | ICD-10-CM | POA: Diagnosis not present

## 2013-10-23 DIAGNOSIS — G629 Polyneuropathy, unspecified: Secondary | ICD-10-CM | POA: Diagnosis not present

## 2013-10-23 DIAGNOSIS — R260 Ataxic gait: Secondary | ICD-10-CM | POA: Diagnosis not present

## 2013-10-23 DIAGNOSIS — I10 Essential (primary) hypertension: Secondary | ICD-10-CM | POA: Diagnosis not present

## 2013-10-23 DIAGNOSIS — M1990 Unspecified osteoarthritis, unspecified site: Secondary | ICD-10-CM | POA: Diagnosis not present

## 2013-10-25 DIAGNOSIS — I2510 Atherosclerotic heart disease of native coronary artery without angina pectoris: Secondary | ICD-10-CM | POA: Diagnosis not present

## 2013-10-25 DIAGNOSIS — G629 Polyneuropathy, unspecified: Secondary | ICD-10-CM | POA: Diagnosis not present

## 2013-10-25 DIAGNOSIS — R260 Ataxic gait: Secondary | ICD-10-CM | POA: Diagnosis not present

## 2013-10-25 DIAGNOSIS — M810 Age-related osteoporosis without current pathological fracture: Secondary | ICD-10-CM | POA: Diagnosis not present

## 2013-10-25 DIAGNOSIS — M1990 Unspecified osteoarthritis, unspecified site: Secondary | ICD-10-CM | POA: Diagnosis not present

## 2013-10-25 DIAGNOSIS — I10 Essential (primary) hypertension: Secondary | ICD-10-CM | POA: Diagnosis not present

## 2014-01-29 ENCOUNTER — Other Ambulatory Visit: Payer: Self-pay | Admitting: Internal Medicine

## 2014-01-29 DIAGNOSIS — I739 Peripheral vascular disease, unspecified: Secondary | ICD-10-CM | POA: Diagnosis not present

## 2014-01-29 DIAGNOSIS — N390 Urinary tract infection, site not specified: Secondary | ICD-10-CM | POA: Diagnosis not present

## 2014-01-29 DIAGNOSIS — I10 Essential (primary) hypertension: Secondary | ICD-10-CM | POA: Diagnosis not present

## 2014-01-29 DIAGNOSIS — Z125 Encounter for screening for malignant neoplasm of prostate: Secondary | ICD-10-CM | POA: Diagnosis not present

## 2014-01-29 DIAGNOSIS — R2681 Unsteadiness on feet: Secondary | ICD-10-CM | POA: Diagnosis not present

## 2014-01-29 DIAGNOSIS — D81818 Other biotin-dependent carboxylase deficiency: Secondary | ICD-10-CM | POA: Diagnosis not present

## 2014-01-29 DIAGNOSIS — E785 Hyperlipidemia, unspecified: Secondary | ICD-10-CM | POA: Diagnosis not present

## 2014-02-13 ENCOUNTER — Ambulatory Visit
Admission: RE | Admit: 2014-02-13 | Discharge: 2014-02-13 | Disposition: A | Discharge status: Home / Self Care | Payer: Medicare Other | Source: Ambulatory Visit | Attending: Internal Medicine | Admitting: Internal Medicine

## 2014-02-13 DIAGNOSIS — I739 Peripheral vascular disease, unspecified: Secondary | ICD-10-CM | POA: Diagnosis not present

## 2014-04-16 ENCOUNTER — Telehealth: Payer: Self-pay | Admitting: Cardiovascular Disease

## 2014-04-16 DIAGNOSIS — M150 Primary generalized (osteo)arthritis: Secondary | ICD-10-CM | POA: Diagnosis not present

## 2014-04-16 DIAGNOSIS — R296 Repeated falls: Secondary | ICD-10-CM | POA: Diagnosis not present

## 2014-04-16 DIAGNOSIS — G629 Polyneuropathy, unspecified: Secondary | ICD-10-CM | POA: Diagnosis not present

## 2014-04-16 DIAGNOSIS — I739 Peripheral vascular disease, unspecified: Secondary | ICD-10-CM | POA: Diagnosis not present

## 2014-04-16 DIAGNOSIS — I2510 Atherosclerotic heart disease of native coronary artery without angina pectoris: Secondary | ICD-10-CM | POA: Diagnosis not present

## 2014-04-16 DIAGNOSIS — I10 Essential (primary) hypertension: Secondary | ICD-10-CM | POA: Diagnosis not present

## 2014-04-16 MED ORDER — ISOSORBIDE MONONITRATE ER 30 MG PO TB24: 30.0000 mg | ORAL_TABLET | Freq: Every evening | ORAL | Status: DC

## 2014-04-16 NOTE — Telephone Encounter (Signed)
[name] [name] is calling because her father Mr Taulbee states that he has been having chest pains for about a month now . When he sleeps it wakes him  Up . Also thinks his medication may need to be adjusted . Please call . When calling if she does not answer have them page her . Thanks

## 2014-04-16 NOTE — Telephone Encounter (Signed)
Spoke to patient's daughter. She is concerned that pt has been waking up at night intermittently for the last month or so with chest pain. Apparently wakes him from sleep. As soon as he sits up or moves a bit, it resolves. Also concerned regarding his neuropathy. She had received literature from a company that advised the lipitor and/or lisinopril could affect/worsen this condition. Informed her I am not knowledgeable about this, would defer to physician.  Upon further discussion of CP, she notes no other symptoms w/ this - and it resolves quickly. He does take meds for acid reflux (Protonix 40mg  QAM). Advised her CP could be from reflux, again, would defer to physician.  Scheduled to be seen May 20th in office.  Daughter notes he gets Rx from [place], has no Rx insurance so would strongly prefer generics if any new prescriptions. No current med issues.

## 2014-04-16 NOTE — Telephone Encounter (Signed)
Scheduled for earlier appt (4/14)  Advised daughter on imdur - she is amenable to plan, can bring for appt at earlier time.

## 2014-04-16 NOTE — Telephone Encounter (Signed)
Start imdur 30 mg every evening and bring back for earlier appointment please.

## 2014-04-18 DIAGNOSIS — I10 Essential (primary) hypertension: Secondary | ICD-10-CM | POA: Diagnosis not present

## 2014-04-18 DIAGNOSIS — I2510 Atherosclerotic heart disease of native coronary artery without angina pectoris: Secondary | ICD-10-CM | POA: Diagnosis not present

## 2014-04-18 DIAGNOSIS — I739 Peripheral vascular disease, unspecified: Secondary | ICD-10-CM | POA: Diagnosis not present

## 2014-04-18 DIAGNOSIS — R296 Repeated falls: Secondary | ICD-10-CM | POA: Diagnosis not present

## 2014-04-18 DIAGNOSIS — M150 Primary generalized (osteo)arthritis: Secondary | ICD-10-CM | POA: Diagnosis not present

## 2014-04-18 DIAGNOSIS — G629 Polyneuropathy, unspecified: Secondary | ICD-10-CM | POA: Diagnosis not present

## 2014-04-23 DIAGNOSIS — I10 Essential (primary) hypertension: Secondary | ICD-10-CM | POA: Diagnosis not present

## 2014-04-23 DIAGNOSIS — I739 Peripheral vascular disease, unspecified: Secondary | ICD-10-CM | POA: Diagnosis not present

## 2014-04-23 DIAGNOSIS — G629 Polyneuropathy, unspecified: Secondary | ICD-10-CM | POA: Diagnosis not present

## 2014-04-23 DIAGNOSIS — I2510 Atherosclerotic heart disease of native coronary artery without angina pectoris: Secondary | ICD-10-CM | POA: Diagnosis not present

## 2014-04-23 DIAGNOSIS — M150 Primary generalized (osteo)arthritis: Secondary | ICD-10-CM | POA: Diagnosis not present

## 2014-04-23 DIAGNOSIS — R296 Repeated falls: Secondary | ICD-10-CM | POA: Diagnosis not present

## 2014-04-25 DIAGNOSIS — G629 Polyneuropathy, unspecified: Secondary | ICD-10-CM | POA: Diagnosis not present

## 2014-04-25 DIAGNOSIS — I739 Peripheral vascular disease, unspecified: Secondary | ICD-10-CM | POA: Diagnosis not present

## 2014-04-25 DIAGNOSIS — R296 Repeated falls: Secondary | ICD-10-CM | POA: Diagnosis not present

## 2014-04-25 DIAGNOSIS — I2510 Atherosclerotic heart disease of native coronary artery without angina pectoris: Secondary | ICD-10-CM | POA: Diagnosis not present

## 2014-04-25 DIAGNOSIS — I10 Essential (primary) hypertension: Secondary | ICD-10-CM | POA: Diagnosis not present

## 2014-04-25 DIAGNOSIS — M150 Primary generalized (osteo)arthritis: Secondary | ICD-10-CM | POA: Diagnosis not present

## 2014-04-26 ENCOUNTER — Encounter: Payer: Self-pay | Admitting: Cardiovascular Disease

## 2014-04-26 ENCOUNTER — Ambulatory Visit (INDEPENDENT_AMBULATORY_CARE_PROVIDER_SITE_OTHER): Payer: Medicare Other | Admitting: Cardiovascular Disease

## 2014-04-26 ENCOUNTER — Encounter: Payer: Self-pay | Admitting: Cardiology

## 2014-04-26 VITALS — BP 112/72 | HR 73 | Temp 97.2°F | Resp 16 | Ht 71.0 in | Wt 191.3 lb

## 2014-04-26 DIAGNOSIS — R079 Chest pain, unspecified: Secondary | ICD-10-CM

## 2014-04-26 DIAGNOSIS — Z79899 Other long term (current) drug therapy: Secondary | ICD-10-CM | POA: Diagnosis not present

## 2014-04-26 DIAGNOSIS — I25110 Atherosclerotic heart disease of native coronary artery with unstable angina pectoris: Secondary | ICD-10-CM

## 2014-04-26 DIAGNOSIS — I200 Unstable angina: Secondary | ICD-10-CM | POA: Diagnosis not present

## 2014-04-26 DIAGNOSIS — Z7901 Long term (current) use of anticoagulants: Secondary | ICD-10-CM

## 2014-04-26 DIAGNOSIS — Z01812 Encounter for preprocedural laboratory examination: Secondary | ICD-10-CM

## 2014-04-26 LAB — CBC
HEMATOCRIT: 43.8 % (ref 39.0–52.0)
HEMOGLOBIN: 14.7 g/dL (ref 13.0–17.0)
MCH: 29.6 pg (ref 26.0–34.0)
MCHC: 33.6 g/dL (ref 30.0–36.0)
MCV: 88.3 fL (ref 78.0–100.0)
MPV: 10 fL (ref 8.6–12.4)
Platelets: 176 10*3/uL (ref 150–400)
RBC: 4.96 MIL/uL (ref 4.22–5.81)
RDW: 14.9 % (ref 11.5–15.5)
WBC: 4.9 10*3/uL (ref 4.0–10.5)

## 2014-04-26 LAB — COMPREHENSIVE METABOLIC PANEL
ALK PHOS: 72 U/L (ref 39–117)
ALT: 12 U/L (ref 0–53)
AST: 19 U/L (ref 0–37)
Albumin: 3.8 g/dL (ref 3.5–5.2)
BUN: 23 mg/dL (ref 6–23)
CALCIUM: 9.1 mg/dL (ref 8.4–10.5)
CO2: 28 mEq/L (ref 19–32)
Chloride: 102 mEq/L (ref 96–112)
Creat: 1.03 mg/dL (ref 0.50–1.35)
Glucose, Bld: 98 mg/dL (ref 70–99)
POTASSIUM: 4.3 meq/L (ref 3.5–5.3)
SODIUM: 139 meq/L (ref 135–145)
TOTAL PROTEIN: 6.7 g/dL (ref 6.0–8.3)
Total Bilirubin: 0.5 mg/dL (ref 0.2–1.2)

## 2014-04-26 LAB — TSH: TSH: 0.364 u[IU]/mL (ref 0.350–4.500)

## 2014-04-26 MED ORDER — ISOSORBIDE MONONITRATE ER 60 MG PO TB24: 60.0000 mg | ORAL_TABLET | Freq: Every evening | ORAL | Status: DC

## 2014-04-26 NOTE — Patient Instructions (Signed)
INCREASE Isosorbide to 60mg  daily.  Your physician has requested that you have a LEFT HEART cardiac catheterization by Dr Claiborne Billings or Dr. Martinique. Cardiac catheterization is used to diagnose and/or treat various heart conditions. Doctors may recommend this procedure for a number of different reasons. The most common reason is to evaluate chest pain. Chest pain can be a symptom of coronary artery disease (CAD), and cardiac catheterization can show whether plaque is narrowing or blocking your heart's arteries. This procedure is also used to evaluate the valves, as well as measure the blood flow and oxygen levels in different parts of your heart. For further information please visit [email or web address]. Please follow instruction sheet, as given.  HOLD LISINOPRIL/HCTZ DAY OF THE CATH.

## 2014-04-27 LAB — APTT: APTT: 35 s (ref 24–37)

## 2014-04-27 LAB — PROTIME-INR
INR: 1.18 (ref <1.50)
PROTHROMBIN TIME: 15 s (ref 11.6–15.2)

## 2014-04-28 DIAGNOSIS — I200 Unstable angina: Secondary | ICD-10-CM | POA: Insufficient documentation

## 2014-04-28 NOTE — Progress Notes (Signed)
Patient ID: [name] [name], male   DOB: [birth year], 81 y.o.   MRN: [id number]      Cardiology Office Note   Date:  04/28/2014   ID:  [name] [name], DOB [birth year], MRN [id number]  PCP:  [name], MD  Cardiologist:   [name], MD   Chief Complaint  Patient presents with  . Annual Exam  . Chest Pain    left side, wakes him up at night - has had relief with Imdur      History of Present Illness: [name] [name] is a 81 y.o. male who presents for recent episodes of chest discomfort at rest over last month that wake him from sleep, improved after initiation of long acting nitrates, but still occurring on an occasional basis. His daughter accompanies him.  Also has GERD (on chronic PPI, symptoms usually different).  He has known CAD with chronic total occlusion of the LAD artery following the takeoff of the first diagonal artery. The distal LAD receives collateral flow via the right coronary artery which by previous coronary angiography has a 50% smooth proximal stenosis. His last angiogram was performed in December of 2011 and medical therapy was recommended. A nuclear perfusion study performed in 2009 shows an extensive anteroapical defect with extensive reversibility. Left ventricular systolic function is borderline with an ejection fraction of 50%. He has mild aortic insufficiency and a mildly dilated left atrium but otherwise no significant structural heart disease.   He is quite sedentary due to severe foot neuropathy and is essentially nonambulatory. 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     Past Medical History  Diagnosis Date  . Arthritis   . Hypertension   . Osteoporosis   . GERD (gastroesophageal reflux disease)   . Thyroid disease     Hyperthyroidism  . Neuropathy     Past Surgical History  Procedure Laterality Date  . Spine surgery       Current Outpatient Prescriptions  Medication Sig  Dispense Refill  . acetaminophen (TYLENOL) 500 MG tablet Take 1,000 mg by mouth every 6 (six) hours as needed (pain).    [name] amLODipine (NORVASC) 10 MG tablet Take 5 mg by mouth daily. Take 1/2 tablet For HTN    . aspirin 81 MG chewable tablet Chew 81 mg by mouth daily.    [name] atorvastatin (LIPITOR) 10 MG tablet Take 10 mg by mouth daily.    . calcium-vitamin D (OSCAL WITH D) 500-200 MG-UNIT per tablet Take 2 tablets by mouth daily with breakfast.    . Cyanocobalamin (B-12 COMPLIANCE INJECTION IJ) Inject as directed every 30 (thirty) days.    . finasteride (PROSCAR) 5 MG tablet Take 5 mg by mouth daily.    . fish oil-omega-3 fatty acids 1000 MG capsule Take 2 g by mouth daily.    [name] guaiFENesin-dextromethorphan (ROBITUSSIN DM) 100-10 MG/5ML syrup Take 5 mLs by mouth every 4 (four) hours as needed for cough.    . isosorbide mononitrate (IMDUR) 60 MG 24 hr tablet Take 1 tablet (60 mg total) by mouth every evening. 90 tablet 3  . lisinopril-hydrochlorothiazide (PRINZIDE,ZESTORETIC) 20-12.5 MG per tablet Take 1 tablet by mouth 2 (two) times daily.    . metoprolol (LOPRESSOR) 100 MG tablet Take 100 mg by mouth 2 (two) times daily.    . naproxen sodium (ANAPROX) 220 MG tablet Take 220 mg by mouth as needed (pain).    . pantoprazole (PROTONIX) 40 MG tablet  Take 1 tablet (40 mg total) by mouth daily. 30 tablet 0  . tamsulosin (FLOMAX) 0.4 MG CAPS capsule Take 1 capsule (0.4 mg total) by mouth daily after supper. 30 capsule 0  . traMADol (ULTRAM) 50 MG tablet Take 1 tablet (50 mg total) by mouth every 6 (six) hours as needed. 25 tablet 0   No current facility-administered medications for this visit.    Allergies:   Cortisone    Social History:  The patient  reports that he has quit smoking. He does not have any smokeless tobacco history on file. He reports that he does not drink alcohol or use illicit drugs.   Family History:  Reviewed, not contributory     ROS:  Please see the history of present  illness.    Functional limitations primarily due to unsteady gait and frequent falls. The patient specifically denies  dyspnea at rest or with exertion, orthopnea, paroxysmal nocturnal dyspnea, syncope, palpitations, new focal neurological deficits, intermittent claudication, lower extremity edema, unexplained weight gain, cough, hemoptysis or wheezing.  The patient also denies abdominal pain, nausea, vomiting, dysphagia, diarrhea, constipation, polyuria, polydipsia, dysuria, hematuria, frequency, urgency, abnormal bleeding or bruising, fever, chills, unexpected weight changes, mood swings, change in skin or hair texture, change in voice quality, auditory or visual problems, allergic reactions or rashes, new musculoskeletal complaints other than usual "aches and pains". All other systems are reviewed and negative.    PHYSICAL EXAM: VS:  BP 112/72 mmHg  Pulse 73  Temp(Src) 97.2 F (36.2 C)  Resp 16  Ht 5\' 11"  (1.803 m)  Wt 191 lb 4.8 oz (86.773 kg)  BMI 26.69 kg/m2 , BMI Body mass index is 26.69 kg/(m^2).  General: Alert, oriented x3, no distress Head: no evidence of trauma, PERRL, EOMI, no exophtalmos or lid lag, no myxedema, no xanthelasma; normal ears, nose and oropharynx Neck: normal jugular venous pulsations and no hepatojugular reflux; brisk carotid pulses without delay and no carotid bruits Chest: clear to auscultation, no signs of consolidation by percussion or palpation, normal fremitus, symmetrical and full respiratory excursions Cardiovascular: normal position and quality of the apical impulse, regular rhythm, normal first and second heart sounds, no murmurs, rubs or gallops Abdomen: no tenderness or distention, no masses by palpation, no abnormal pulsatility or arterial bruits, normal bowel sounds, no hepatosplenomegaly Extremities: no clubbing, cyanosis or edema; 2+ radial, ulnar and brachial pulses bilaterally; 2+ right femoral, posterior tibial and dorsalis pedis pulses; 2+ left  femoral, posterior tibial and dorsalis pedis pulses; no subclavian or femoral bruits Neurological: grossly nonfocal Psych: euthymic mood, full affect   EKG:  EKG is ordered today. The ekg ordered today demonstrates Normal sinus rhythm, chronic right bundle branch block, a few PACs   Recent Labs: 04/26/2014: ALT 12; BUN 23; Creatinine 1.03; Hemoglobin 14.7; Platelets 176; Potassium 4.3; Sodium 139; TSH 0.364    Lipid Panel Total chol 120, TG 52, HDL 38, LDL 73  Wt Readings from Last 3 Encounters:  04/26/14 191 lb 4.8 oz (86.773 kg)  07/04/13 194 lb (87.998 kg)  06/27/13 195 lb 6.4 oz (88.633 kg)     ASSESSMENT AND PLAN:  It is unclear whether his symptoms are due to coronary insufficiency, but the crescendo pattern and improvement after nitrates is concerning. He has an extensive area of ischemia in the anterior distribution due to an occluded LAD that fills via right to left collaterals. If he has had progression of his moderate RCA stenosis, he may have a very large territory of  myocardium in jeopardy.  I do not think nuclear imaging will be helpful. Options are conservative medical therapy or repeat coronary angiography. These options were reviewed with the patient and his family and we settled on coronary angio performed by one of our interventional colleagues. I do not think he is a candidate for surgical revascularization due to poor rehab potential.   Current medicines are reviewed at length with the patient today.  The patient does not have concerns regarding medicines.  The following changes have been made:  Increase isosorbide mononitrate to 60 mg daily   Labs/ tests ordered today include:  Orders Placed This Encounter  Procedures  . APTT  . Protime-INR  . CBC  . Comprehensive metabolic panel  . TSH  . EKG 12-Lead  . LEFT HEART CATHETERIZATION WITH CORONARY ANGIOGRAM    Patient Instructions  INCREASE Isosorbide to 60mg  daily.  Your physician has requested that  you have a LEFT HEART cardiac catheterization by Dr Claiborne Billings or Dr. Martinique. Cardiac catheterization is used to diagnose and/or treat various heart conditions. Doctors may recommend this procedure for a number of different reasons. The most common reason is to evaluate chest pain. Chest pain can be a symptom of coronary artery disease (CAD), and cardiac catheterization can show whether plaque is narrowing or blocking your heart's arteries. This procedure is also used to evaluate the valves, as well as measure the blood flow and oxygen levels in different parts of your heart. For further information please visit [email or web address]. Please follow instruction sheet, as given.  HOLD LISINOPRIL/HCTZ DAY OF THE CATH.    Mikael Spray, MD  04/28/2014 5:29 PM    [name], MD, [facility] [phone] office [phone] pager

## 2014-04-29 ENCOUNTER — Ambulatory Visit (INDEPENDENT_AMBULATORY_CARE_PROVIDER_SITE_OTHER): Payer: Medicare Other

## 2014-04-29 ENCOUNTER — Ambulatory Visit (INDEPENDENT_AMBULATORY_CARE_PROVIDER_SITE_OTHER): Payer: Medicare Other | Admitting: Internal Medicine

## 2014-04-29 VITALS — BP 144/72 | HR 63 | Temp 97.3°F | Resp 20 | Wt 193.4 lb

## 2014-04-29 DIAGNOSIS — R918 Other nonspecific abnormal finding of lung field: Secondary | ICD-10-CM

## 2014-04-29 DIAGNOSIS — R0602 Shortness of breath: Secondary | ICD-10-CM

## 2014-04-29 DIAGNOSIS — R062 Wheezing: Secondary | ICD-10-CM | POA: Diagnosis not present

## 2014-04-29 DIAGNOSIS — R222 Localized swelling, mass and lump, trunk: Secondary | ICD-10-CM | POA: Diagnosis not present

## 2014-04-29 DIAGNOSIS — I200 Unstable angina: Secondary | ICD-10-CM

## 2014-04-29 MED ORDER — LEVALBUTEROL TARTRATE 45 MCG/ACT IN AERO: 2.0000 | INHALATION_SPRAY | Freq: Four times a day (QID) | RESPIRATORY_TRACT | Status: DC | PRN

## 2014-04-29 NOTE — Progress Notes (Addendum)
Subjective:  This chart was scribed for [name] Lin MD, by Tamsen Roers, at [facility].  This patient was seen in room 2 and the patient's care was started at 10:46 AM.   Chief Complaint  Patient presents with  . URI    chest congestion x 5 days.  clear sputum  . Wheezing    x 5 days     Patient ID: [name] [name], male    DOB: [birth year], 81 y.o.   MRN: [id number]  HPI  HPI Comments: [name] [name] is a 81 y.o. male with a history of CAD, GERD, and hypertension presents to [facility] for wheezing onset four days ago.  He has associated symptoms of congestion with clear mucous.  He is scheduled for a heart catheterization on the 19th and had his temperature taken at his doctors office which was normal. He denies shortness of breath currently but did have it two days ago along with difficulty sleeping. Per daughter, his wheezing was very noticeable over the phone.   He did not have difficutly sleeping last night. He denies any difficulty swallowing or itchy eyes. He has no other complaints today.   Patient Active Problem List   Diagnosis Date Noted  . Angina pectoris, crescendo 04/28/2014  . Contusion of left hip 07/04/2013  . Fall 07/04/2013  . BPH (benign prostatic hypertrophy) 05/31/2013  . CAD (coronary artery disease) 05/31/2013  . UTI (urinary tract infection) 05/31/2013  . Hyperlipidemia 05/31/2013  . Dysphagia, pharyngoesophageal phase 04/10/2013  . Frequent falls 04/10/2013  . Weakness 04/10/2013  . Hyperthyroidism, subclinical 04/10/2013  . GERD (gastroesophageal reflux disease) 04/06/2013  . Bladder outflow obstruction 04/06/2013  . Dehydration 04/05/2013  . Hypertension 04/05/2013  . Arthritis 04/05/2013  . Peripheral neuropathy 04/05/2013  . Gastroenteritis, acute 04/05/2013  . Gastroenteritis 04/05/2013  no new meds    Current Outpatient Prescriptions on File Prior to Visit  Medication Sig Dispense Refill  .  acetaminophen (TYLENOL) 500 MG tablet Take 1,000 mg by mouth every 6 (six) hours as needed (pain).    [name] amLODipine (NORVASC) 10 MG tablet Take 5 mg by mouth daily. Take 1/2 tablet For HTN    . aspirin 81 MG chewable tablet Chew 81 mg by mouth daily.    [name] atorvastatin (LIPITOR) 10 MG tablet Take 10 mg by mouth daily.    . calcium-vitamin D (OSCAL WITH D) 500-200 MG-UNIT per tablet Take 2 tablets by mouth daily with breakfast.    . Cyanocobalamin (B-12 COMPLIANCE INJECTION IJ) Inject as directed every 30 (thirty) days.    . finasteride (PROSCAR) 5 MG tablet Take 5 mg by mouth daily.    . fish oil-omega-3 fatty acids 1000 MG capsule Take 2 g by mouth daily.    [name] guaiFENesin-dextromethorphan (ROBITUSSIN DM) 100-10 MG/5ML syrup Take 5 mLs by mouth every 4 (four) hours as needed for cough.    . isosorbide mononitrate (IMDUR) 60 MG 24 hr tablet Take 1 tablet (60 mg total) by mouth every evening. 90 tablet 3  . lisinopril-hydrochlorothiazide (PRINZIDE,ZESTORETIC) 20-12.5 MG per tablet Take 1 tablet by mouth 2 (two) times daily.    . metoprolol (LOPRESSOR) 100 MG tablet Take 100 mg by mouth 2 (two) times daily.    . naproxen sodium (ANAPROX) 220 MG tablet Take 220 mg by mouth as needed (pain).    . pantoprazole (PROTONIX) 40 MG tablet Take 1 tablet (40 mg total) by mouth daily. 30 tablet 0  .  tamsulosin (FLOMAX) 0.4 MG CAPS capsule Take 1 capsule (0.4 mg total) by mouth daily after supper. 30 capsule 0  . traMADol (ULTRAM) 50 MG tablet Take 1 tablet (50 mg total) by mouth every 6 (six) hours as needed. 25 tablet 0   No current facility-administered medications on file prior to visit.    Allergies  Allergen Reactions  . Cortisone     Causes emotional problems         Review of Systems  Constitutional: Negative for fever, chills, appetite change and unexpected weight change.  HENT: Positive for congestion. Negative for dental problem, drooling, ear discharge, nosebleeds and trouble swallowing.     Eyes: Negative for discharge and redness.  Respiratory: Positive for wheezing. Negative for cough and chest tightness.   Cardiovascular: Negative for chest pain, palpitations and leg swelling.  Gastrointestinal: Negative for nausea, vomiting and abdominal pain.  Neurological: Negative for syncope.       Objective:   Physical Exam  Constitutional: He is oriented to person, place, and time. He appears well-developed and well-nourished. No distress.  HENT:  Head: Normocephalic and atraumatic.  Eyes: Conjunctivae and EOM are normal. Pupils are equal, round, and reactive to light.  Neck: Neck supple.  Cardiovascular: Normal rate and regular rhythm.  Exam reveals no gallop.   No murmur heard. Pulmonary/Chest: Effort normal. No respiratory distress. He has wheezes.  Only at the end of forces expiration are wheezes heard and they are most prominent over the left posterior thoracic area.   Musculoskeletal: Normal range of motion. He exhibits no edema.  Lymphadenopathy:    He has no cervical adenopathy.  Neurological: He is alert and oriented to person, place, and time. No cranial nerve deficit.  Skin: Skin is warm and dry.  Psychiatric: He has a normal mood and affect. His behavior is normal.  Nursing note and vitals reviewed.  BP 144/72 mmHg  Pulse 63  Temp(Src) 97.3 F (36.3 C) (Oral)  Resp 20  Wt 193 lb 6 oz (87.714 kg)  SpO2 94%  [facility] reading (PRIMARY) by  Dr. Laney Pastor exaggeration of right perihilar area compared to last x-ray-? Mass lesion Otherwise unchanged from past. no effusion or acute infiltrate      Assessment & Plan:  I have completed the patient encounter in its entirety as documented by the scribe, with editing by me where necessary. [name], M.D.  SOB (shortness of breath)  Wheezing - Plan: DG Chest 2 View  Hilar mass - Plan: CT Chest W Contrast  CT Chest ordered---wheezing could be mechanical vs reactive airway Will use xopenex ///hold on  cath for now Meds ordered this encounter  Medications  . levalbuterol (XOPENEX HFA) 45 MCG/ACT inhaler    Sig: Inhale 2 puffs into the lungs every 6 (six) hours as needed for wheezing. He needs a spacer device as well    Dispense:  1 Inhaler    Refill:  12

## 2014-04-30 ENCOUNTER — Telehealth: Payer: Self-pay | Admitting: Cardiovascular Disease

## 2014-04-30 ENCOUNTER — Telehealth: Payer: Self-pay

## 2014-04-30 DIAGNOSIS — I2510 Atherosclerotic heart disease of native coronary artery without angina pectoris: Secondary | ICD-10-CM | POA: Diagnosis not present

## 2014-04-30 DIAGNOSIS — R296 Repeated falls: Secondary | ICD-10-CM | POA: Diagnosis not present

## 2014-04-30 DIAGNOSIS — I10 Essential (primary) hypertension: Secondary | ICD-10-CM | POA: Diagnosis not present

## 2014-04-30 DIAGNOSIS — I739 Peripheral vascular disease, unspecified: Secondary | ICD-10-CM | POA: Diagnosis not present

## 2014-04-30 DIAGNOSIS — M150 Primary generalized (osteo)arthritis: Secondary | ICD-10-CM | POA: Diagnosis not present

## 2014-04-30 DIAGNOSIS — G629 Polyneuropathy, unspecified: Secondary | ICD-10-CM | POA: Diagnosis not present

## 2014-04-30 NOTE — Telephone Encounter (Signed)
[name] [name] would like to speak with someone regarding her father about a previous xray that only a nurse could answer and something about a PCP Please call [phone]

## 2014-04-30 NOTE — Telephone Encounter (Signed)
Daughter called wanting to find out if pt's cath should be postponed or cancelled. He saw PCP the other day who had ordered chest CT to follow up on mass found on X-ray.

## 2014-04-30 NOTE — Telephone Encounter (Signed)
Pt advised on delay of cath. Will follow up w/ [place] after chest CT is done.

## 2014-04-30 NOTE — Telephone Encounter (Signed)
Spoke with daughter, [name]. She wants records sent to DR [facility].

## 2014-04-30 NOTE — Telephone Encounter (Signed)
Pt's daughter called in stating that over the weekend the pt was having some respiratory issues so she took him to urgent medical and there he saw Dr. Sonia Baller. She states that the doctor found a mass in his lung and wanted to run a diagnostic scan on him. The daughter would like to know if his Catheterazation should be postponed until further notice, cath is scheduled for tomorrow. Please f/u  Thanks

## 2014-04-30 NOTE — Telephone Encounter (Signed)
Yes, I would delay the cath until after he has his chest CT

## 2014-05-01 ENCOUNTER — Ambulatory Visit
Admission: RE | Admit: 2014-05-01 | Discharge: 2014-05-01 | Disposition: A | Discharge status: Home / Self Care | Payer: Medicare Other | Source: Ambulatory Visit | Attending: Internal Medicine | Admitting: Internal Medicine

## 2014-05-01 ENCOUNTER — Telehealth: Payer: Self-pay | Admitting: Internal Medicine

## 2014-05-01 ENCOUNTER — Encounter: Payer: Self-pay | Admitting: Cardiovascular Disease

## 2014-05-01 ENCOUNTER — Telehealth: Payer: Self-pay | Admitting: Cardiovascular Disease

## 2014-05-01 ENCOUNTER — Ambulatory Visit (HOSPITAL_COMMUNITY): Admission: RE | Admit: 2014-05-01 | Payer: Medicare Other | Source: Ambulatory Visit | Admitting: Cardiology

## 2014-05-01 ENCOUNTER — Encounter (HOSPITAL_COMMUNITY): Admission: RE | Payer: Self-pay | Source: Ambulatory Visit

## 2014-05-01 DIAGNOSIS — I2510 Atherosclerotic heart disease of native coronary artery without angina pectoris: Secondary | ICD-10-CM | POA: Diagnosis not present

## 2014-05-01 DIAGNOSIS — I712 Thoracic aortic aneurysm, without rupture: Secondary | ICD-10-CM | POA: Diagnosis not present

## 2014-05-01 DIAGNOSIS — R918 Other nonspecific abnormal finding of lung field: Secondary | ICD-10-CM

## 2014-05-01 SURGERY — LEFT HEART CATHETERIZATION WITH CORONARY ANGIOGRAM

## 2014-05-01 MED ORDER — IOPAMIDOL (ISOVUE-300) INJECTION 61%
75.0000 mL | Freq: Once | INTRAVENOUS | Status: AC | PRN
  Administered 2014-05-01: 75 mL via INTRAVENOUS

## 2014-05-01 NOTE — Telephone Encounter (Signed)
I discussed the CT results with the daughter and advised going ahead with cardiac catheterization : No right hilar mass. Prominent pulmonary arteries in the hila bilaterally suggest possibility of pulmonary arterial hypertension.  Borderline heart size. Coronary artery disease There is an aneurysm present but there is a history of an aneurysm that was congenital

## 2014-05-01 NOTE — Telephone Encounter (Signed)
Please let of note that the CT of his chest does not show any evidence of serious abnormality such as lung cancer. The chest x-ray was abnormal because of prominent pulmonary arteries, which may be an expression of his heart disease. Please reschedule him for coronary angiogram as had been planned either with Dr. Claiborne Billings or Dr. Martinique. Need to have be met done again since he received contrast for CT of the chest

## 2014-05-01 NOTE — Telephone Encounter (Signed)
Pt's daughter [name] called in to inform Dr. Loletha Grayer that the pt's CT scan has been read and that [place] would be sending it to our office.   Thanks

## 2014-05-01 NOTE — Telephone Encounter (Signed)
Spoke with daughters [name] and [id number]. Information given. Expect a call tomorrow to set up coronary angiogram. Please contact [place] she will be making the arrangements.  The daughters wanted Dr [name] to be aware that they took their father went to urgent care on 04/29/14 [facility]).  They wanted to know if Dr. [name] was concerned about patient's wheezing. [name] states patient received prescription for XOPENEX inhaler. RN informed [name] wiil give information to Dr [name]

## 2014-05-01 NOTE — Telephone Encounter (Signed)
For Dr [name] review

## 2014-05-02 ENCOUNTER — Encounter: Payer: Self-pay | Admitting: Cardiovascular Disease

## 2014-05-02 ENCOUNTER — Telehealth: Payer: Self-pay | Admitting: *Deleted

## 2014-05-02 ENCOUNTER — Other Ambulatory Visit: Payer: Self-pay | Admitting: *Deleted

## 2014-05-02 DIAGNOSIS — I2510 Atherosclerotic heart disease of native coronary artery without angina pectoris: Secondary | ICD-10-CM | POA: Diagnosis not present

## 2014-05-02 DIAGNOSIS — M150 Primary generalized (osteo)arthritis: Secondary | ICD-10-CM | POA: Diagnosis not present

## 2014-05-02 DIAGNOSIS — I10 Essential (primary) hypertension: Secondary | ICD-10-CM | POA: Diagnosis not present

## 2014-05-02 DIAGNOSIS — R296 Repeated falls: Secondary | ICD-10-CM | POA: Diagnosis not present

## 2014-05-02 DIAGNOSIS — R079 Chest pain, unspecified: Secondary | ICD-10-CM

## 2014-05-02 DIAGNOSIS — Z79899 Other long term (current) drug therapy: Secondary | ICD-10-CM

## 2014-05-02 DIAGNOSIS — G629 Polyneuropathy, unspecified: Secondary | ICD-10-CM | POA: Diagnosis not present

## 2014-05-02 DIAGNOSIS — I739 Peripheral vascular disease, unspecified: Secondary | ICD-10-CM | POA: Diagnosis not present

## 2014-05-02 NOTE — Telephone Encounter (Signed)
Spoke with daughter [name] and she would like scheduling to call her so she can look at her appt book to arrange the cath when her husband does not have an appt.  Info taken to [place] who will contact [place] and get the [place] scheduled with Dr. Claiborne Billings or Dr. Martinique.

## 2014-05-02 NOTE — Telephone Encounter (Signed)
[name] instructed to take her dad to the lab Friday for a BMET prior to his cath per Dr. Loletha Grayer.  Voiced understanding.

## 2014-05-02 NOTE — Telephone Encounter (Signed)
Returning your call. °

## 2014-05-04 ENCOUNTER — Other Ambulatory Visit: Payer: Self-pay | Admitting: *Deleted

## 2014-05-04 ENCOUNTER — Telehealth: Payer: Self-pay | Admitting: Cardiovascular Disease

## 2014-05-04 DIAGNOSIS — Z79899 Other long term (current) drug therapy: Secondary | ICD-10-CM | POA: Diagnosis not present

## 2014-05-04 LAB — BASIC METABOLIC PANEL
BUN: 27 mg/dL — ABNORMAL HIGH (ref 6–23)
CALCIUM: 9.3 mg/dL (ref 8.4–10.5)
CO2: 30 mEq/L (ref 19–32)
Chloride: 106 mEq/L (ref 96–112)
Creat: 1.01 mg/dL (ref 0.50–1.35)
Glucose, Bld: 80 mg/dL (ref 70–99)
Potassium: 4.3 mEq/L (ref 3.5–5.3)
SODIUM: 141 meq/L (ref 135–145)

## 2014-05-04 NOTE — Telephone Encounter (Signed)
Records have been sent to Dr. Thressa Sheller per Dr. Ninfa Meeker request.

## 2014-05-04 NOTE — Telephone Encounter (Signed)
Order released.

## 2014-05-04 NOTE — Telephone Encounter (Signed)
She needs lab orders for pt

## 2014-05-07 DIAGNOSIS — I10 Essential (primary) hypertension: Secondary | ICD-10-CM | POA: Diagnosis not present

## 2014-05-07 DIAGNOSIS — I2510 Atherosclerotic heart disease of native coronary artery without angina pectoris: Secondary | ICD-10-CM | POA: Diagnosis not present

## 2014-05-07 DIAGNOSIS — G629 Polyneuropathy, unspecified: Secondary | ICD-10-CM | POA: Diagnosis not present

## 2014-05-07 DIAGNOSIS — M150 Primary generalized (osteo)arthritis: Secondary | ICD-10-CM | POA: Diagnosis not present

## 2014-05-07 DIAGNOSIS — R296 Repeated falls: Secondary | ICD-10-CM | POA: Diagnosis not present

## 2014-05-07 DIAGNOSIS — I739 Peripheral vascular disease, unspecified: Secondary | ICD-10-CM | POA: Diagnosis not present

## 2014-05-08 ENCOUNTER — Observation Stay (HOSPITAL_COMMUNITY)
Admission: RE | Admit: 2014-05-08 | Discharge: 2014-05-11 | Disposition: A | Discharge status: Home / Self Care | Payer: Medicare Other | Source: Ambulatory Visit | Attending: Cardiovascular Disease | Admitting: Cardiovascular Disease

## 2014-05-08 ENCOUNTER — Encounter (HOSPITAL_COMMUNITY)
Admission: RE | Disposition: A | Discharge status: Home / Self Care | Payer: PRIVATE HEALTH INSURANCE | Source: Ambulatory Visit | Attending: Cardiovascular Disease

## 2014-05-08 ENCOUNTER — Encounter (HOSPITAL_COMMUNITY): Payer: Self-pay | Admitting: General Practice

## 2014-05-08 DIAGNOSIS — R079 Chest pain, unspecified: Secondary | ICD-10-CM

## 2014-05-08 DIAGNOSIS — I10 Essential (primary) hypertension: Secondary | ICD-10-CM | POA: Insufficient documentation

## 2014-05-08 DIAGNOSIS — I2582 Chronic total occlusion of coronary artery: Secondary | ICD-10-CM | POA: Insufficient documentation

## 2014-05-08 DIAGNOSIS — Z79899 Other long term (current) drug therapy: Secondary | ICD-10-CM | POA: Insufficient documentation

## 2014-05-08 DIAGNOSIS — I2583 Coronary atherosclerosis due to lipid rich plaque: Secondary | ICD-10-CM | POA: Insufficient documentation

## 2014-05-08 DIAGNOSIS — E785 Hyperlipidemia, unspecified: Secondary | ICD-10-CM | POA: Insufficient documentation

## 2014-05-08 DIAGNOSIS — I25110 Atherosclerotic heart disease of native coronary artery with unstable angina pectoris: Secondary | ICD-10-CM | POA: Diagnosis not present

## 2014-05-08 DIAGNOSIS — I2510 Atherosclerotic heart disease of native coronary artery without angina pectoris: Secondary | ICD-10-CM | POA: Diagnosis not present

## 2014-05-08 DIAGNOSIS — Z7982 Long term (current) use of aspirin: Secondary | ICD-10-CM | POA: Insufficient documentation

## 2014-05-08 DIAGNOSIS — E7800 Pure hypercholesterolemia, unspecified: Secondary | ICD-10-CM | POA: Diagnosis present

## 2014-05-08 DIAGNOSIS — I200 Unstable angina: Secondary | ICD-10-CM | POA: Diagnosis present

## 2014-05-08 DIAGNOSIS — E876 Hypokalemia: Secondary | ICD-10-CM | POA: Insufficient documentation

## 2014-05-08 DIAGNOSIS — G629 Polyneuropathy, unspecified: Secondary | ICD-10-CM | POA: Diagnosis not present

## 2014-05-08 DIAGNOSIS — I714 Abdominal aortic aneurysm, without rupture: Secondary | ICD-10-CM | POA: Diagnosis not present

## 2014-05-08 DIAGNOSIS — Z9861 Coronary angioplasty status: Secondary | ICD-10-CM

## 2014-05-08 DIAGNOSIS — R531 Weakness: Secondary | ICD-10-CM | POA: Diagnosis not present

## 2014-05-08 HISTORY — DX: Major depressive disorder, single episode, unspecified: F32.9

## 2014-05-08 HISTORY — DX: Depression, unspecified: F32.A

## 2014-05-08 HISTORY — DX: Thyrotoxicosis, unspecified without thyrotoxic crisis or storm: E05.90

## 2014-05-08 HISTORY — DX: Pure hypercholesterolemia, unspecified: E78.00

## 2014-05-08 HISTORY — DX: Atherosclerotic heart disease of native coronary artery without angina pectoris: I25.10

## 2014-05-08 HISTORY — PX: LEFT HEART CATHETERIZATION WITH CORONARY ANGIOGRAM: SHX5451

## 2014-05-08 HISTORY — PX: CARDIAC CATHETERIZATION: SHX172

## 2014-05-08 HISTORY — DX: Angina pectoris, unspecified: I20.9

## 2014-05-08 LAB — BASIC METABOLIC PANEL
Anion gap: 10 (ref 5–15)
BUN: 19 mg/dL (ref 6–23)
CALCIUM: 8.9 mg/dL (ref 8.4–10.5)
CO2: 24 mmol/L (ref 19–32)
CREATININE: 0.93 mg/dL (ref 0.50–1.35)
Chloride: 105 mmol/L (ref 96–112)
GFR calc Af Amer: 86 mL/min — ABNORMAL LOW (ref >90)
GFR, EST NON AFRICAN AMERICAN: 74 mL/min — AB (ref >90)
GLUCOSE: 91 mg/dL (ref 70–99)
POTASSIUM: 3.4 mmol/L — AB (ref 3.5–5.1)
SODIUM: 139 mmol/L (ref 135–145)

## 2014-05-08 LAB — POCT ACTIVATED CLOTTING TIME: Activated Clotting Time: 423 seconds

## 2014-05-08 LAB — MAGNESIUM: MAGNESIUM: 2 mg/dL (ref 1.5–2.5)

## 2014-05-08 SURGERY — LEFT HEART CATHETERIZATION WITH CORONARY ANGIOGRAM
Anesthesia: LOCAL

## 2014-05-08 MED ORDER — HEPARIN (PORCINE) IN NACL 2-0.9 UNIT/ML-% IJ SOLN
INTRAMUSCULAR | Status: AC
  Filled 2014-05-08: qty 500

## 2014-05-08 MED ORDER — SODIUM CHLORIDE 0.9 % IV SOLN
INTRAVENOUS | Status: DC
  Administered 2014-05-08: 07:00:00 via INTRAVENOUS

## 2014-05-08 MED ORDER — NITROGLYCERIN 1 MG/10 ML FOR IR/CATH LAB
INTRA_ARTERIAL | Status: AC
  Filled 2014-05-08: qty 10

## 2014-05-08 MED ORDER — MIDAZOLAM HCL 2 MG/2ML IJ SOLN
INTRAMUSCULAR | Status: AC
  Filled 2014-05-08: qty 2

## 2014-05-08 MED ORDER — ONDANSETRON HCL 4 MG/2ML IJ SOLN: 4.0000 mg | Freq: Four times a day (QID) | INTRAMUSCULAR | Status: DC | PRN

## 2014-05-08 MED ORDER — SODIUM CHLORIDE 0.9 % IJ SOLN: 3.0000 mL | INTRAMUSCULAR | Status: DC | PRN

## 2014-05-08 MED ORDER — VERAPAMIL HCL 2.5 MG/ML IV SOLN
INTRAVENOUS | Status: AC
Start: 2014-05-08 — End: 2014-05-08
  Filled 2014-05-08: qty 2

## 2014-05-08 MED ORDER — TICAGRELOR 90 MG PO TABS
90.0000 mg | ORAL_TABLET | Freq: Two times a day (BID) | ORAL | Status: DC
  Administered 2014-05-08 – 2014-05-10 (×5): 90 mg via ORAL
  Filled 2014-05-08 (×7): qty 1

## 2014-05-08 MED ORDER — HYDRALAZINE HCL 20 MG/ML IJ SOLN
10.0000 mg | INTRAMUSCULAR | Status: DC | PRN
  Administered 2014-05-08 – 2014-05-10 (×2): 10 mg via INTRAVENOUS
  Filled 2014-05-08 (×2): qty 1

## 2014-05-08 MED ORDER — ASPIRIN EC 81 MG PO TBEC
81.0000 mg | DELAYED_RELEASE_TABLET | Freq: Every day | ORAL | Status: DC
  Administered 2014-05-09 – 2014-05-11 (×3): 81 mg via ORAL
  Filled 2014-05-08 (×3): qty 1

## 2014-05-08 MED ORDER — ACETAMINOPHEN 325 MG PO TABS
650.0000 mg | ORAL_TABLET | ORAL | Status: DC | PRN
  Administered 2014-05-08: 650 mg via ORAL
  Filled 2014-05-08 (×2): qty 2

## 2014-05-08 MED ORDER — TICAGRELOR 90 MG PO TABS
ORAL_TABLET | ORAL | Status: AC
  Administered 2014-05-08: 21:00:00 90 mg via ORAL
  Filled 2014-05-08: qty 2

## 2014-05-08 MED ORDER — ANGIOPLASTY BOOK
Freq: Once | Status: AC
  Administered 2014-05-08: 21:00:00
  Filled 2014-05-08: qty 1

## 2014-05-08 MED ORDER — POTASSIUM CHLORIDE CRYS ER 20 MEQ PO TBCR
40.0000 meq | EXTENDED_RELEASE_TABLET | Freq: Once | ORAL | Status: AC
  Administered 2014-05-08: 21:00:00 40 meq via ORAL
  Filled 2014-05-08: qty 2

## 2014-05-08 MED ORDER — ATORVASTATIN CALCIUM 80 MG PO TABS
80.0000 mg | ORAL_TABLET | Freq: Every day | ORAL | Status: DC
  Administered 2014-05-08 – 2014-05-10 (×3): 80 mg via ORAL
  Filled 2014-05-08 (×4): qty 1

## 2014-05-08 MED ORDER — SODIUM CHLORIDE 0.9 % IV SOLN: INTRAVENOUS | Status: DC

## 2014-05-08 MED ORDER — BIVALIRUDIN 250 MG IV SOLR
INTRAVENOUS | Status: AC
  Filled 2014-05-08: qty 250

## 2014-05-08 MED ORDER — HEPARIN (PORCINE) IN NACL 2-0.9 UNIT/ML-% IJ SOLN
INTRAMUSCULAR | Status: AC
Start: 2014-05-08 — End: 2014-05-08
  Filled 2014-05-08: qty 500

## 2014-05-08 MED ORDER — FENTANYL CITRATE (PF) 100 MCG/2ML IJ SOLN
INTRAMUSCULAR | Status: AC
  Filled 2014-05-08: qty 2

## 2014-05-08 NOTE — CV Procedure (Addendum)
[name] [name] is a 81 y.o. male   [id number]  [id number] LOCATION:  FACILITY: [facility]  PHYSICIAN: [name], MD, [facility] 06-24-1925   DATE OF PROCEDURE:  05/08/2014     CARDIAC CATHETERIZATION/PERCUTANEOUS CORONARY INTERVENTION    HISTORY:   [name] [name] is an 81 year old gentleman who has documented previous LAD occlusion with collaterals arising predominantly from the RCA. He has developed recurrent episodes of chest pain awakening him from sleep which is nitrate responsive. He is referred by Dr. Recardo Evangelist for cardiac catheterization and possible percutaneous coronary intervention.   PROCEDURE:  Left heart catheterization: Coronary angiography, left ventriculography;  PCI with PTCA/DES stenting of the RCA.  The patient was brought to the second floor [facility] cath lab in the postabsorptive state. The patient was premedicated with Versed 1 mg and fentanyl 25 mcg. A right radial approach was initially attempted after an Allen's test verified adequate circulation. The right radial artery was punctured via the Seldinger technique, and a 6 [place] Glidesheath Slender was inserted without difficulty.  A radial cocktail consisting of Verapamil, IV nitroglycerin, and lidocaine was administered.  Due to very marked tortuosity in the subclavian system there was inadequate torque ability to navigate the catheters to the coronary ostium without the catheters kinking.  For this reason, the radial approach was aborted. A radial bland was applied for hemostasis and the procedure was transition to the right femoral approach.   Femoral artery was punctured anteriorly without difficulty and a 5 French sheath was inserted.  Diagnostic catheterization was done utilizing 5 [place] JL5 and JR4 catheters. A 5 French preterm catheters used for left ventriculography.  With the demonstration of a 95% stenosis in a large dominant RCA which was jeopardizing collaterals to the LAD system, the decision was made to perform  PCI to the RCA. Angiomax bolus plus infusion was a Company secretary.  The 5 French sheath was changed to a 6 [place] sheath.  Brilinta 180 mg was administered orally for antiplatelet therapy.  After demonstration of therapeutic anticoagulation, a Prowater wire was advanced down the RCA and a 2.512 mm Emerge balloon was used for predilatation at 6 and 9 atm.  A Xience Alpine 3.518 mm DES stent was inserted and deployed at 11 and 13 atm.  An  Euphora 3.7515 mm balloon was used for post stent dilatation up to 3.7 mm.  Angiography confirmed an excellent angiographic result.  The arterial sheath was sutured in place.  The patient left the catheterization laboratory chest pain-free with stable hemodynamics.  HEMODYNAMICS:   Central Aorta:  147/71  Left Ventricle:  147/13/23  ANGIOGRAPHY:   The left main coronary artery was angiographically normal and bifurcated into the LAD and left circumflex coronary artery.   The LAD  Was occluded proximally after the takeoff of the very proximal septal perforating artery.  There was collateralization to the diagonal vessel in the mid distal LAD system both from right to left and mild left to left collaterals.  The left circumflex coronary artery was  A moderate size vessel that gave rise to one major bifurcating obtuse marginal branch.  There was mild 20% proximal circumflex stenosis and 30% mid OM stenosis.  The RCA was a large dominant vessel that gave rise to a large PDA and PLA vessel.  There was a focal 95% stenosis after the acute margin and proximal to the takeoff of a large PDA system.  There were collaterals supplying the LAD which were jeopardized by this stenosis.  Left ventriculography revealed  normal global LV contractility with mild distal anterolateral hypocontractility.  Ejection fraction is at least 55%. There was no evidence for mitral regurgitation.    Following PCI to the large dominant RCA the 95% stenosis was reduced to 0% with ultimate insertion  of a science Alpine 3.518 mm DES stent postdilated to 3.7 mm. There was brisk TIMI-3 flow.  There was no for dissection.  There were significantly improved collaterals supplying the RCA following the PCI procedure.   Total contrast used: 160 cc  Omnipaque  IMPRESSION:  Preserved global LV contractility with mild distal anterolateral hypocontractility an ejection fraction of at least 55%.  Multivessel CAD with old total occlusion of the proximal LAD with left to left and right to left collaterals, mild 20% proximal circumflex and 30% mid OM stenosis; and 30% proximal RCA stenosis with a 95% distal RCA stenosis proximal to the PDA takeoff with jeopardized collaterals to the LAD.  Successful PCI with PTCA/DES stenting with insertion of a 3.518 mm Xience Alpine DES stent postdilated 3.7 mm the 95% RCA stenosis being reduced to 0%.   RECOMMENDATION:  The patient will continue with dual antiplatelet therapy for minimum of one-year.  Medical therapy will be continued for his concomitant CAD with previously documented old total LAD occlusion with collateralization.  The collaterals are now improved following PCI to the RCA.  [name], MD, [facility] 05/08/2014 10:06 AM

## 2014-05-08 NOTE — Interval H&P Note (Signed)
Cath Lab Visit (complete for each Cath Lab visit)  Clinical Evaluation Leading to the Procedure:   ACS: No.  Non-ACS:    Anginal Classification: CCS IV  Anti-ischemic medical therapy: Maximal Therapy (2 or more classes of medications)  Non-Invasive Test Results: High-risk stress test findings: cardiac mortality >3%/year  Prior CABG: No previous CABG      History and Physical Interval Note:  05/08/2014 8:22 AM  [name] [name]  has presented today for surgery, with the diagnosis of ONGOING CP  The various methods of treatment have been discussed with the patient and family. After consideration of risks, benefits and other options for treatment, the patient has consented to  Procedure(s): LEFT HEART CATHETERIZATION WITH CORONARY ANGIOGRAM (N/A) as a surgical intervention .  The patient's history has been reviewed, patient examined, no change in status, stable for surgery.  I have reviewed the patient's chart and labs.  Questions were answered to the patient's satisfaction.     [name]

## 2014-05-08 NOTE — Progress Notes (Signed)
Site area: right groin  Site Prior to Removal:  Level 0  Pressure Applied For 30 MINUTES    Minutes Beginning at 1340  Manual:   Yes.    Patient Status During Pull:  stable  Post Pull Groin Site:  Level 0  Post Pull Instructions Given:  Yes.    Post Pull Pulses Present:  Yes.    Dressing Applied:  Yes.    Comments:  Patient forgetful Family at bedside to remind him to keep leg straight/ checked frequently with no change in assessment.  TR BAND REMOVAL  LOCATION:    right radial  DEFLATED PER PROTOCOL:    Yes.    TIME BAND OFF / DRESSING APPLIED:    1445   SITE UPON ARRIVAL:    Level 0  SITE AFTER BAND REMOVAL:    Level 0  CIRCULATION SENSATION AND MOVEMENT:    Within Normal Limits   Yes.    COMMENTS:   Checked frequently with no change in assessment

## 2014-05-08 NOTE — Progress Notes (Signed)
Patient had 9 beat run of VT, asymptomatic. Notified Melina Copa PA. Labs for BMT and Mg ordered with parameters of levels to call to provider.

## 2014-05-08 NOTE — Progress Notes (Signed)
Patient uses his arms to move around in the home. Concerns voiced from family, if he cannot use his right hand, he is unable to use wheelchair or transfer on and off wheelchair and toilet. Concerns regarding bleeding of radial site. Patient lives alone and has home health services but no one is able to stay with patient consistently, family works and they are only able to check in periodically.

## 2014-05-08 NOTE — H&P (View-Only) (Signed)
Patient ID: [name] [name], male   DOB: [birth year], 81 y.o.   MRN: [id number]      Cardiology Office Note   Date:  04/28/2014   ID:  [name] [name], DOB 1925/05/07, MRN [id number]  PCP:  [name], MD  Cardiologist:   [name], MD   Chief Complaint  Patient presents with  . Annual Exam  . Chest Pain    left side, wakes him up at night - has had relief with Imdur      History of Present Illness: [name] [name] is a 81 y.o. male who presents for recent episodes of chest discomfort at rest over last month that wake him from sleep, improved after initiation of long acting nitrates, but still occurring on an occasional basis. His daughter accompanies him.  Also has GERD (on chronic PPI, symptoms usually different).  He has known CAD with chronic total occlusion of the LAD artery following the takeoff of the first diagonal artery. The distal LAD receives collateral flow via the right coronary artery which by previous coronary angiography has a 50% smooth proximal stenosis. His last angiogram was performed in December of 2011 and medical therapy was recommended. A nuclear perfusion study performed in 2009 shows an extensive anteroapical defect with extensive reversibility. Left ventricular systolic function is borderline with an ejection fraction of 50%. He has mild aortic insufficiency and a mildly dilated left atrium but otherwise no significant structural heart disease.   He is quite sedentary due to severe foot neuropathy and is essentially nonambulatory. 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     Past Medical History  Diagnosis Date  . Arthritis   . Hypertension   . Osteoporosis   . GERD (gastroesophageal reflux disease)   . Thyroid disease     Hyperthyroidism  . Neuropathy     Past Surgical History  Procedure Laterality Date  . Spine surgery       Current Outpatient Prescriptions  Medication Sig  Dispense Refill  . acetaminophen (TYLENOL) 500 MG tablet Take 1,000 mg by mouth every 6 (six) hours as needed (pain).    [name] amLODipine (NORVASC) 10 MG tablet Take 5 mg by mouth daily. Take 1/2 tablet For HTN    . aspirin 81 MG chewable tablet Chew 81 mg by mouth daily.    [name] atorvastatin (LIPITOR) 10 MG tablet Take 10 mg by mouth daily.    . calcium-vitamin D (OSCAL WITH D) 500-200 MG-UNIT per tablet Take 2 tablets by mouth daily with breakfast.    . Cyanocobalamin (B-12 COMPLIANCE INJECTION IJ) Inject as directed every 30 (thirty) days.    . finasteride (PROSCAR) 5 MG tablet Take 5 mg by mouth daily.    . fish oil-omega-3 fatty acids 1000 MG capsule Take 2 g by mouth daily.    [name] guaiFENesin-dextromethorphan (ROBITUSSIN DM) 100-10 MG/5ML syrup Take 5 mLs by mouth every 4 (four) hours as needed for cough.    . isosorbide mononitrate (IMDUR) 60 MG 24 hr tablet Take 1 tablet (60 mg total) by mouth every evening. 90 tablet 3  . lisinopril-hydrochlorothiazide (PRINZIDE,ZESTORETIC) 20-12.5 MG per tablet Take 1 tablet by mouth 2 (two) times daily.    . metoprolol (LOPRESSOR) 100 MG tablet Take 100 mg by mouth 2 (two) times daily.    . naproxen sodium (ANAPROX) 220 MG tablet Take 220 mg by mouth as needed (pain).    . pantoprazole (PROTONIX) 40 MG tablet  Take 1 tablet (40 mg total) by mouth daily. 30 tablet 0  . tamsulosin (FLOMAX) 0.4 MG CAPS capsule Take 1 capsule (0.4 mg total) by mouth daily after supper. 30 capsule 0  . traMADol (ULTRAM) 50 MG tablet Take 1 tablet (50 mg total) by mouth every 6 (six) hours as needed. 25 tablet 0   No current facility-administered medications for this visit.    Allergies:   Cortisone    Social History:  The patient  reports that he has quit smoking. He does not have any smokeless tobacco history on file. He reports that he does not drink alcohol or use illicit drugs.   Family History:  Reviewed, not contributory     ROS:  Please see the history of present  illness.    Functional limitations primarily due to unsteady gait and frequent falls. The patient specifically denies  dyspnea at rest or with exertion, orthopnea, paroxysmal nocturnal dyspnea, syncope, palpitations, new focal neurological deficits, intermittent claudication, lower extremity edema, unexplained weight gain, cough, hemoptysis or wheezing.  The patient also denies abdominal pain, nausea, vomiting, dysphagia, diarrhea, constipation, polyuria, polydipsia, dysuria, hematuria, frequency, urgency, abnormal bleeding or bruising, fever, chills, unexpected weight changes, mood swings, change in skin or hair texture, change in voice quality, auditory or visual problems, allergic reactions or rashes, new musculoskeletal complaints other than usual "aches and pains". All other systems are reviewed and negative.    PHYSICAL EXAM: VS:  BP 112/72 mmHg  Pulse 73  Temp(Src) 97.2 F (36.2 C)  Resp 16  Ht 5\' 11"  (1.803 m)  Wt 191 lb 4.8 oz (86.773 kg)  BMI 26.69 kg/m2 , BMI Body mass index is 26.69 kg/(m^2).  General: Alert, oriented x3, no distress Head: no evidence of trauma, PERRL, EOMI, no exophtalmos or lid lag, no myxedema, no xanthelasma; normal ears, nose and oropharynx Neck: normal jugular venous pulsations and no hepatojugular reflux; brisk carotid pulses without delay and no carotid bruits Chest: clear to auscultation, no signs of consolidation by percussion or palpation, normal fremitus, symmetrical and full respiratory excursions Cardiovascular: normal position and quality of the apical impulse, regular rhythm, normal first and second heart sounds, no murmurs, rubs or gallops Abdomen: no tenderness or distention, no masses by palpation, no abnormal pulsatility or arterial bruits, normal bowel sounds, no hepatosplenomegaly Extremities: no clubbing, cyanosis or edema; 2+ radial, ulnar and brachial pulses bilaterally; 2+ right femoral, posterior tibial and dorsalis pedis pulses; 2+ left  femoral, posterior tibial and dorsalis pedis pulses; no subclavian or femoral bruits Neurological: grossly nonfocal Psych: euthymic mood, full affect   EKG:  EKG is ordered today. The ekg ordered today demonstrates Normal sinus rhythm, chronic right bundle branch block, a few PACs   Recent Labs: 04/26/2014: ALT 12; BUN 23; Creatinine 1.03; Hemoglobin 14.7; Platelets 176; Potassium 4.3; Sodium 139; TSH 0.364    Lipid Panel Total chol 120, TG 52, HDL 38, LDL 73  Wt Readings from Last 3 Encounters:  04/26/14 191 lb 4.8 oz (86.773 kg)  07/04/13 194 lb (87.998 kg)  06/27/13 195 lb 6.4 oz (88.633 kg)     ASSESSMENT AND PLAN:  It is unclear whether his symptoms are due to coronary insufficiency, but the crescendo pattern and improvement after nitrates is concerning. He has an extensive area of ischemia in the anterior distribution due to an occluded LAD that fills via right to left collaterals. If he has had progression of his moderate RCA stenosis, he may have a very large territory of  myocardium in jeopardy.  I do not think nuclear imaging will be helpful. Options are conservative medical therapy or repeat coronary angiography. These options were reviewed with the patient and his family and we settled on coronary angio performed by one of our interventional colleagues. I do not think he is a candidate for surgical revascularization due to poor rehab potential.   Current medicines are reviewed at length with the patient today.  The patient does not have concerns regarding medicines.  The following changes have been made:  Increase isosorbide mononitrate to 60 mg daily   Labs/ tests ordered today include:  Orders Placed This Encounter  Procedures  . APTT  . Protime-INR  . CBC  . Comprehensive metabolic panel  . TSH  . EKG 12-Lead  . LEFT HEART CATHETERIZATION WITH CORONARY ANGIOGRAM    Patient Instructions  INCREASE Isosorbide to 60mg  daily.  Your physician has requested that  you have a LEFT HEART cardiac catheterization by Dr Claiborne Billings or Dr. Martinique. Cardiac catheterization is used to diagnose and/or treat various heart conditions. Doctors may recommend this procedure for a number of different reasons. The most common reason is to evaluate chest pain. Chest pain can be a symptom of coronary artery disease (CAD), and cardiac catheterization can show whether plaque is narrowing or blocking your heart's arteries. This procedure is also used to evaluate the valves, as well as measure the blood flow and oxygen levels in different parts of your heart. For further information please visit [email or web address]. Please follow instruction sheet, as given.  HOLD LISINOPRIL/HCTZ DAY OF THE CATH.    Mikael Spray, MD  04/28/2014 5:29 PM    [name], MD, [facility] [phone] office [phone] pager

## 2014-05-09 DIAGNOSIS — I2510 Atherosclerotic heart disease of native coronary artery without angina pectoris: Secondary | ICD-10-CM | POA: Diagnosis not present

## 2014-05-09 DIAGNOSIS — I2582 Chronic total occlusion of coronary artery: Secondary | ICD-10-CM | POA: Diagnosis not present

## 2014-05-09 DIAGNOSIS — I25110 Atherosclerotic heart disease of native coronary artery with unstable angina pectoris: Secondary | ICD-10-CM | POA: Diagnosis not present

## 2014-05-09 DIAGNOSIS — E876 Hypokalemia: Secondary | ICD-10-CM | POA: Diagnosis not present

## 2014-05-09 DIAGNOSIS — E785 Hyperlipidemia, unspecified: Secondary | ICD-10-CM | POA: Diagnosis not present

## 2014-05-09 DIAGNOSIS — I2583 Coronary atherosclerosis due to lipid rich plaque: Secondary | ICD-10-CM | POA: Diagnosis not present

## 2014-05-09 DIAGNOSIS — I10 Essential (primary) hypertension: Secondary | ICD-10-CM | POA: Diagnosis not present

## 2014-05-09 MED ORDER — LISINOPRIL-HYDROCHLOROTHIAZIDE 20-12.5 MG PO TABS: 1.0000 | ORAL_TABLET | Freq: Two times a day (BID) | ORAL | Status: DC

## 2014-05-09 MED ORDER — HYDROCHLOROTHIAZIDE 12.5 MG PO CAPS
12.5000 mg | ORAL_CAPSULE | Freq: Two times a day (BID) | ORAL | Status: DC
  Administered 2014-05-09 – 2014-05-11 (×5): 12.5 mg via ORAL
  Filled 2014-05-09 (×6): qty 1

## 2014-05-09 MED ORDER — METOPROLOL TARTRATE 100 MG PO TABS
100.0000 mg | ORAL_TABLET | Freq: Two times a day (BID) | ORAL | Status: DC
  Administered 2014-05-09 – 2014-05-11 (×5): 100 mg via ORAL
  Filled 2014-05-09 (×2): qty 1
  Filled 2014-05-09: qty 4
  Filled 2014-05-09: qty 1
  Filled 2014-05-09 (×2): qty 4

## 2014-05-09 MED ORDER — AMLODIPINE BESYLATE 5 MG PO TABS
5.0000 mg | ORAL_TABLET | Freq: Every day | ORAL | Status: DC
  Administered 2014-05-09 – 2014-05-11 (×3): 5 mg via ORAL
  Filled 2014-05-09 (×3): qty 1

## 2014-05-09 MED ORDER — LISINOPRIL 20 MG PO TABS
20.0000 mg | ORAL_TABLET | Freq: Two times a day (BID) | ORAL | Status: DC
  Administered 2014-05-09 – 2014-05-11 (×5): 20 mg via ORAL
  Filled 2014-05-09 (×6): qty 1

## 2014-05-09 MED ORDER — POTASSIUM CHLORIDE CRYS ER 20 MEQ PO TBCR
40.0000 meq | EXTENDED_RELEASE_TABLET | Freq: Once | ORAL | Status: AC
  Administered 2014-05-09: 40 meq via ORAL
  Filled 2014-05-09: qty 2

## 2014-05-09 MED ORDER — ISOSORBIDE MONONITRATE ER 60 MG PO TB24
60.0000 mg | ORAL_TABLET | Freq: Every evening | ORAL | Status: DC
  Administered 2014-05-09 – 2014-05-10 (×2): 60 mg via ORAL
  Filled 2014-05-09 (×3): qty 1

## 2014-05-09 MED FILL — Sodium Chloride IV Soln 0.9%: INTRAVENOUS | Qty: 50 | Status: AC

## 2014-05-09 NOTE — Progress Notes (Signed)
CARDIAC REHAB PHASE I   Pt wheelchair bound at baseline, non-ambulatory, uses arms to lift himself. Pt daughter, [name] at bedside. Daughter states pt lives alone, has some home health through [place].  Daughter states she is concerned pt may need assistance that they cannot provide at discharge. Daughter states pt sometimes "forgets to take his medication" and eats mostly "tv dinners."  Daughter and pt state they would be interested in short term rehab if appropriate. PT to eval pt today. RN notified. Pt denies CP but states he has "can't catch his breath off and on since last night." RN notified as may be a side effect of new brilinta. Completed stent education with pt and daughter.  Reviewed anti-platelet therapy, stent card, activity restrictions, ntg, heart healthy diet. Pt and daughter verbalized understanding. Pt and daughter agree pt not appropriate for phase 2 at this time.   [id number]  Lenna Sciara, RN, BSN 05/09/2014 10:21 AM

## 2014-05-09 NOTE — Evaluation (Signed)
Physical Therapy Evaluation Patient Details Name: [name] [name] MRN: [id number] DOB: [birth year] Today's Date: 05/09/2014   History of Present Illness  81 yo male with CP, Multivessel CAD with old total occlusion of the proximal LAD with left to left and right to left collaterals, mild 20% proximal circumflex and 30% mid OM stenosis; and 30% proximal RCA stenosis with a 95% distal RCA stenosis proximal to the PDA takeoff with jeopardized collaterals to the LAD. Successful PCI with PTCA/DES stenting   Clinical Impression  Pt admitted with above diagnosis. Pt currently with functional limitations due to the deficits listed below (see PT Problem List). Pt with balance deficits as well as fatigue with minimal activity, requiring supervision for safety.  Pt will benefit from skilled PT to increase their independence and safety with mobility to allow discharge to the venue listed below.       Follow Up Recommendations SNF;Supervision for mobility/OOB    Equipment Recommendations  None recommended by PT    Recommendations for Other Services       Precautions / Restrictions Precautions Precautions: Fall Precaution Comments: came in last year with falls Restrictions Weight Bearing Restrictions: No      Mobility  Bed Mobility Overal bed mobility: Needs Assistance Bed Mobility: Supine to Sit     Supine to sit: Min assist     General bed mobility comments: min A to stabilize pt as he pulled self fwd. Educated pt on rolling over first if he is unable to sit straight up in bed, struggled with this today. Pt reports though that before procedure, this was not diffiuclt  Transfers Overall transfer level: Needs assistance   Transfers: Squat Pivot Transfers     Squat pivot transfers: Min guard     General transfer comment: min guard squat pivot bed to chair. Pt having some difficulty maintaining balance today, guarded in front to prevent fwd LOB during fwd lean for squat  pivot  Ambulation/Gait             General Gait Details: NT  Stairs            Wheelchair Mobility    Modified Rankin (Stroke Patients Only)       Balance Overall balance assessment: Needs assistance Sitting-balance support: Bilateral upper extremity supported;Feet supported Sitting balance-Leahy Scale: Fair Sitting balance - Comments: several posterior LOB sitting EOB, min A to correct, after practice and fwd scoot, was able to maintain sitting without UE support with supervision Postural control: Posterior lean                                   Pertinent Vitals/Pain Pain Assessment: No/denies pain    Home Living Family/patient expects to be discharged to:: Private residence Living Arrangements: Alone Available Help at Discharge: Family;Available PRN/intermittently Type of Home: House Home Access: Ramped entrance     Home Layout: Two level;Able to live on main level with bedroom/bathroom Home Equipment: Walker - 4 wheels;Wheelchair - manual Additional Comments: pt reports that he no longer walks. Per chart in 3/15, was walking in community with RW. Transfers in and out of w/c and uses arms and legs to propel self    Prior Function Level of Independence: Independent with assistive device(s)         Comments: was receiving [facility]     Hand Dominance        Extremity/Trunk Assessment   Upper Extremity Assessment: Overall  WFL for tasks assessed           Lower Extremity Assessment: Generalized weakness;RLE deficits/detail;LLE deficits/detail RLE Deficits / Details: not formally tested due to femoral entry during cath, strength >3/5 but not full, tight hamstrings LLE Deficits / Details: grossly 3+/5 throughout, tight hamstrings  Cervical / Trunk Assessment: Kyphotic  Communication   Communication: No difficulties (heavy accent)  Cognition Arousal/Alertness: Awake/alert Behavior During Therapy: WFL for tasks  assessed/performed Overall Cognitive Status: Within Functional Limits for tasks assessed                      General Comments General comments (skin integrity, edema, etc.): Pt HR 74 bpm with transfer but increased WOB and pt fatigued after squat pivot    Exercises General Exercises - Lower Extremity Ankle Circles/Pumps: AROM;Both;10 reps;Supine Heel Slides: AROM;Both;10 reps;Supine      Assessment/Plan    PT Assessment Patient needs continued PT services  PT Diagnosis Generalized weakness   PT Problem List Decreased strength;Decreased activity tolerance;Decreased balance;Decreased mobility;Decreased knowledge of precautions  PT Treatment Interventions DME instruction;Functional mobility training;Therapeutic activities;Therapeutic exercise;Balance training;Patient/family education;Neuromuscular re-education   PT Goals (Current goals can be found in the Care Plan section) Acute Rehab PT Goals Patient Stated Goal: return home PT Goal Formulation: With patient Time For Goal Achievement: 05/23/14 Potential to Achieve Goals: Good    Frequency Min 3X/week   Barriers to discharge Decreased caregiver support lives alone, daughters help him out     Co-evaluation               End of Session Equipment Utilized During Treatment: Gait belt Activity Tolerance: Patient tolerated treatment well Patient left: in chair;with call bell/phone within reach Nurse Communication: Mobility status    Functional Assessment Tool Used: clinical judgement Functional Limitation: Mobility: Walking and moving around Mobility: Walking and Moving Around Current Status [phone]): At least 20 percent but less than 40 percent impaired, limited or restricted Mobility: Walking and Moving Around Goal Status [phone]): At least 1 percent but less than 20 percent impaired, limited or restricted    Time: 1342-1404 PT Time Calculation (min) (ACUTE ONLY): 22 min   Charges:   PT Evaluation $Initial  PT Evaluation Tier I: 1 Procedure     PT G Codes:   PT G-Codes **NOT FOR INPATIENT CLASS** Functional Assessment Tool Used: clinical judgement Functional Limitation: Mobility: Walking and moving around Mobility: Walking and Moving Around Current Status [id number]): At least 20 percent but less than 40 percent impaired, limited or restricted Mobility: Walking and Moving Around Goal Status [phone]): At least 1 percent but less than 20 percent impaired, limited or restricted  Leighton Roach, PT  Acute Rehab Services  [place], [place] 05/09/2014, 2:25 PM

## 2014-05-09 NOTE — Clinical Social Work Note (Signed)
Clinical Social Work Assessment  Patient Details  Name: [name] [name] MRN: [id number] Date of Birth: 02-27-1925  Date of referral:  05/09/14               Reason for consult:  Facility Placement                Permission sought to share information with:  Family Supports Permission granted to share information::  Yes, Verbal Permission Granted  Name::     Daughter,  [name] [name]  Agency::     Relationship::     Contact Information:     Housing/Transportation Living arrangements for the past 2 months:  [place] of Information:  Patient, Other (Comment Required) (Daughter [name] [name]) Patient Interpreter Needed:  None Criminal Activity/Legal Involvement Pertinent to Current Situation/Hospitalization:  No - Comment as needed Significant Relationships:  Adult Children Lives with:  Self Do you feel safe going back to the place where you live?  Yes Need for family participation in patient care:  Yes (Comment)  Care giving concerns:  Patient lives alone   Social Worker assessment / plan:  CSW talked with patient and daughter, [name] [name] [id number] and after 9:30 am can call work number [phone]) regarding discharge planning. Daughter reported that patient has 3 daughters and she is the middle child. Her older sister is [name] [name] (lives in [place]) and her younger sister is [name] [name] and she lives in [place], [place]. [name] [name] started the conversation by explaining to CSW that patient lives alone and is wheelchair bound and his need to be independent. Patient was very involved in the conversation and was very pleasant and joked with CSW during the visit. Patient explained that he had a stent put on and that he had "95% blockage".  Daughter went through ADL's and cited patient's current status: he bathes himself but has fallen in the shower before; can feed himself but because he is in a wheelchair can't see in the pots he may put on the stove. She later reported  that patient mainly eats frozen dinners; he can use the bathroom himself, but is not supposed to be using his right arm currently due to surgery; he can dress him self and also make up his bed, but per daughter, does not make up the bed well. Per [name] [name], a housekeeper cleans his home once every 5 weeks. Daughter reports that patient likes rehab and patient voiced agreement and informed CSW that he likes to walk daily. Daughter concerned about patient going home because he is currently weak and has problems with his balance. Daughter also concerned about current blood pressure and indicated that he is on several BP meds.  [name] [name] reported that he has been to [facility] before and that they have a very good therapist.His first choice is [facility] and second choice is [facility]. He was last at rehab in 2015 per patient and daughter.   Employment status:  Retired Health visitor, Managed Care PT Recommendations:  [place] / Referral to community resources:     Patient/Family's Response to care:  Patient in agreement with ST rehab at discharge.  Patient/Family's Understanding of and Emotional Response to Diagnosis, Current Treatment, and Prognosis:  Not discussed.  Emotional Assessment Appearance:  Appears stated age Attitude/Demeanor/Rapport:  Other (Appropriate) Affect (typically observed):  Calm, Appropriate, Pleasant Orientation:  Oriented to Self, Oriented to Place, Oriented to  Time, Oriented to Situation Alcohol / Substance use:  Other (  Unknown) Psych involvement (Current and /or in the community):  No (Comment)  Discharge Needs  Concerns to be addressed:  Discharge Planning Concerns Readmission within the last 30 days:  No Current discharge risk:  Lives alone Barriers to Discharge:  No Barriers Identified   Sable Feil, LCSW 05/09/2014, 7:15 PM

## 2014-05-09 NOTE — Clinical Social Work Placement (Signed)
   CLINICAL SOCIAL WORK PLACEMENT  NOTE  Date:  05/09/2014  Patient Details  Name: [name] [name] MRN: [id number] Date of Birth: 31-Oct-1925  Clinical Social Work is seeking post-discharge placement for this patient at the [place] level of care (*CSW will initial, date and re-position this form in  chart as items are completed):  Yes   Patient/family provided with [place] Work Department's list of facilities offering this level of care within the geographic area requested by the patient (or if unable, by the patient's family).  Yes   Patient/family informed of their freedom to choose among providers that offer the needed level of care, that participate in Medicare, Medicaid or managed care program needed by the patient, have an available bed and are willing to accept the patient.      Patient/family informed of [facility]'s ownership interest in [facility] and [facility], as well as of the fact that they are under no obligation to receive care at these facilities.  PASRR submitted to EDS on 04/12/09     PASRR number received on 04/12/09     Existing PASRR number confirmed on 05/09/14     FL2 transmitted to all facilities in geographic area requested by pt/family on 05/09/14     FL2 transmitted to all facilities within larger geographic area on       Patient informed that his/her managed care company has contracts with or will negotiate with certain facilities, including the following:            Patient/family informed of bed offers received.  Patient chooses bed at       Physician recommends and patient chooses bed at      Patient to be transferred to   on  .  Patient to be transferred to facility by       Patient family notified on   of transfer.  Name of family member notified:        PHYSICIAN       Additional Comment:  05/09/14 - Patient's daughter provided with SNF list for [facility].   _______________________________________________ Sable Feil, LCSW 05/09/2014, 7:58 PM

## 2014-05-09 NOTE — Clinical Social Work Placement (Signed)
   CLINICAL SOCIAL WORK PLACEMENT  NOTE  Date:  05/09/2014  Patient Details  Name: [name] [name] MRN: [id number] Date of Birth: 1925/02/24  Clinical Social Work is seeking post-discharge placement for this patient at the [place] level of care (*CSW will initial, date and re-position this form in  chart as items are completed):  Yes   Patient/family provided with [place] Work Department's list of facilities offering this level of care within the geographic area requested by the patient (or if unable, by the patient's family).  Yes   Patient/family informed of their freedom to choose among providers that offer the needed level of care, that participate in Medicare, Medicaid or managed care program needed by the patient, have an available bed and are willing to accept the patient.      Patient/family informed of [facility]'s ownership interest in [facility] and [facility], as well as of the fact that they are under no obligation to receive care at these facilities.  PASRR submitted to EDS on 04/12/09     PASRR number received on 04/12/09     Existing PASRR number confirmed on 05/09/14     FL2 transmitted to all facilities in geographic area requested by pt/family on 05/09/14     FL2 transmitted to all facilities within larger geographic area on       Patient informed that his/her managed care company has contracts with or will negotiate with certain facilities, including the following:            Patient/family informed of bed offers received.  Patient chooses bed at       Physician recommends and patient chooses bed at      Patient to be transferred to   on  .  Patient to be transferred to facility by       Patient family notified on   of transfer.  Name of family member notified:        PHYSICIAN      Additional Comment:    _______________________________________________ Sable Feil, LCSW 05/09/2014, 7:57  PM

## 2014-05-09 NOTE — Progress Notes (Signed)
    Subjective: Feels tired  Objective: Vital signs in last 24 hours: Temp:  [97.6 F (36.4 C)-98.8 F (37.1 C)] 97.9 F (36.6 C) (04/27 0417) Pulse Rate:  [46-79] 73 (04/27 0417) Resp:  [12-23] 20 (04/27 0432) BP: (130-197)/(58-105) 166/81 mmHg (04/27 0432) SpO2:  [93 %-99 %] 96 % (04/27 0432) Weight:  [198 lb 13.7 oz (90.2 kg)] 198 lb 13.7 oz (90.2 kg) (04/27 0005)    Intake/Output from previous day: 04/26 0701 - 04/27 0700 In: 930 [P.O.:480; I.V.:450] Out: 2550 [Urine:2550] Intake/Output this shift:    Medications Scheduled Meds: . aspirin EC  81 mg Oral Daily  . atorvastatin  80 mg Oral q1800  . ticagrelor  90 mg Oral BID   Continuous Infusions: . sodium chloride Stopped (05/08/14 2330)   PRN Meds:.acetaminophen, hydrALAZINE, ondansetron (ZOFRAN) IV  PE: General appearance: alert, cooperative and no distress Lungs: clear to auscultation bilaterally Heart: regular rate and rhythm, S1, S2 normal, no murmur, click, rub or gallop Extremities: No LEE Pulses: 2+ and symmetric Skin: Warm and Dry Neurologic: Grossly normal  Lab Results:  No results for input(s): WBC, HGB, HCT, PLT in the last 72 hours. BMET  Recent Labs  05/08/14 1720  NA 139  K 3.4*  CL 105  CO2 24  GLUCOSE 91  BUN 19  CREATININE 0.93  CALCIUM 8.9      Assessment/Plan  Active Problems:   CAD (coronary artery disease)   Coronary artery disease due to lipid rich plaque   Hypokalemia   SP left heart cath Preserved global LV contractility with mild distal anterolateral hypocontractility an ejection fraction of at least 55%.    Multivessel CAD with old total occlusion of the proximal LAD with left to left and right to left collaterals, mild 20% proximal circumflex and 30% mid OM stenosis; and 30% proximal RCA stenosis with a 95% distal RCA stenosis proximal to the PDA takeoff with jeopardized collaterals to the LAD.  Successful PCI with PTCA/DES stenting with insertion of a 3.518  mm Xience Alpine DES stent postdilated 3.7 mm the 95% RCA stenosis being reduced to 0%.  ASA/brilinta.   Scr WNL.  BP poorly controlled.  Restart home lopressor, ACE/HCTZ, amlodipine.    Replace K.   He lives alone and pulls himself around in a wheel chair.  Will get PT evaluation today.  Possible DC home later.   Tarri Fuller PA-C 05/09/2014 7:25 AM   Patient seen and examined. Agree with assessment and plan. No recurrent chest pain. Feels better. Resume BB for improved rate control and amlodipine.  PT to see ? Dc later today. K replete.    [name], MD, [facility] 05/09/2014 10:39 AM

## 2014-05-09 NOTE — Discharge Summary (Signed)
Physician Discharge Summary     Cardiologist:  Croitoru  Patient ID: [name] [name] MRN: [id number] DOB/AGE: 1925/11/08 81 y.o.  Admit date: 05/08/2014 Discharge date: 05/11/2014  Admission Diagnoses:  CAD  Discharge Diagnoses:  Principal Problem:   Coronary artery disease due to lipid rich plaque Active Problems:   Hypertension   Hyperlipidemia   Unstable angina   Hypokalemia   Deconditioning, weakness  Discharged Condition: stable  Hospital Course:  [name] [name] is a 81 y.o. male who presented in the office for recent episodes of chest discomfort at rest over last month that wake him from sleep, improved after initiation of long acting nitrates, but still occurring on an occasional basis. His daughter accompanied him.  Also has GERD (on chronic PPI, symptoms usually different).  He has known CAD with chronic total occlusion of the LAD artery following the takeoff of the first diagonal artery. The distal LAD receives collateral flow via the right coronary artery which by previous coronary angiography has a 50% smooth proximal stenosis. His last angiogram was performed in December of 2011 and medical therapy was recommended. A nuclear perfusion study performed in 2009 shows an extensive anteroapical defect with extensive reversibility. Left ventricular systolic function is borderline with an ejection fraction of 50%. He has mild aortic insufficiency and a mildly dilated left atrium but otherwise no significant structural heart disease.   He is quite sedentary due to severe foot neuropathy and is essentially nonambulatory. 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    He was admitted for a left heart cath which revealed preserved global LV contractility with mild distal anterolateral hypocontractility an ejection fraction of at least 55%.  Multivessel CAD with old total occlusion of the proximal LAD with left to left and  right to left collaterals, mild 20% proximal circumflex and 30% mid OM stenosis; and 30% proximal RCA stenosis with a 95% distal RCA stenosis proximal to the PDA takeoff with jeopardized collaterals to the LAD.   He underwent successful PCI with PTCA/DES stenting with insertion of a 3.518 mm Xience Alpine DES stent postdilated 3.7 mm the 95% RCA stenosis being reduced to 0%. ASA/brilinta.  Post cath Scr WNL.   BP poorly controlled so home lopressor, ACE, amlodipine continued.  We replaced K+. He lives alone and was pulling himself around in a wheel chair.   A physical therapy consult was called and skilled nursing placement was recommended. Social Work discussed this with the family and placement was pursued. He is felt to have significant deconditioning and hopefully will improve as an outpatient. However, due to insurance issues, he will be going home with home health.  On 04/29, the patient was seen by Dr. Burt Knack, who felt he was stable for DC home. Outpatient PT and home health have been arranged. No further inpatient workup is needed and he is discharged home, with early outpatient followup arranged.   Consults: Cardiac rehab  Significant Diagnostic Studies:  CARDIAC CATHETERIZATION/PERCUTANEOUS CORONARY INTERVENTION    HISTORY:   [name] [name] is an 81 year old gentleman who has documented previous LAD occlusion with collaterals arising predominantly from the RCA. He has developed recurrent episodes of chest pain awakening him from sleep which is nitrate responsive. He is referred by Dr. Recardo Evangelist for cardiac catheterization and possible percutaneous coronary intervention.   PROCEDURE: Left heart catheterization: Coronary angiography, left ventriculography; PCI with PTCA/DES stenting of the RCA.  The patient was brought to the  second floor [facility]iac cath lab in the postabsorptive state. The patient was premedicated with Versed 1 mg and fentanyl 25 mcg. A right radial approach was  initially attempted after an Allen's test verified adequate circulation. The right radial artery was punctured via the Seldinger technique, and a 6 [place] Glidesheath Slender was inserted without difficulty. A radial cocktail consisting of Verapamil, IV nitroglycerin, and lidocaine was administered. Due to very marked tortuosity in the subclavian system there was inadequate torque ability to navigate the catheters to the coronary ostium without the catheters kinking. For this reason, the radial approach was aborted. A radial bland was applied for hemostasis and the procedure was transition to the right femoral approach. Femoral artery was punctured anteriorly without difficulty and a 5 French sheath was inserted. Diagnostic catheterization was done utilizing 5 [place] JL5 and JR4 catheters. A 5 French preterm catheters used for left ventriculography. With the demonstration of a 95% stenosis in a large dominant RCA which was jeopardizing collaterals to the LAD system, the decision was made to perform PCI to the RCA. Angiomax bolus plus infusion was a Company secretary. The 5 French sheath was changed to a 6 [place] sheath. Brilinta 180 mg was administered orally for antiplatelet therapy. After demonstration of therapeutic anticoagulation, a Prowater wire was advanced down the RCA and a 2.512 mm Emerge balloon was used for predilatation at 6 and 9 atm. A Xience Alpine 3.518 mm DES stent was inserted and deployed at 11 and 13 atm. An [place] Euphora 3.7515 mm balloon was used for post stent dilatation up to 3.7 mm. Angiography confirmed an excellent angiographic result. The arterial sheath was sutured in place. The patient left the catheterization laboratory chest pain-free with stable hemodynamics.  HEMODYNAMICS:   Central Aorta: 147/71  Left Ventricle: 147/13/23  ANGIOGRAPHY:   The left main coronary artery was angiographically normal and bifurcated into the LAD and left circumflex coronary artery.    The LAD Was occluded proximally after the takeoff of the very proximal septal perforating artery. There was collateralization to the diagonal vessel in the mid distal LAD system both from right to left and mild left to left collaterals.  The left circumflex coronary artery was A moderate size vessel that gave rise to one major bifurcating obtuse marginal branch. There was mild 20% proximal circumflex stenosis and 30% mid OM stenosis.  The RCA was a large dominant vessel that gave rise to a large PDA and PLA vessel. There was a focal 95% stenosis after the acute margin and proximal to the takeoff of a large PDA system. There were collaterals supplying the LAD which were jeopardized by this stenosis.  Left ventriculography revealed normal global LV contractility with mild distal anterolateral hypocontractility. Ejection fraction is at least 55%. There was no evidence for mitral regurgitation.   Following PCI to the large dominant RCA the 95% stenosis was reduced to 0% with ultimate insertion of a science Alpine 3.518 mm DES stent postdilated to 3.7 mm. There was brisk TIMI-3 flow. There was no for dissection. There were significantly improved collaterals supplying the RCA following the PCI procedure.   Total contrast used: 160 cc Omnipaque  IMPRESSION:  Preserved global LV contractility with mild distal anterolateral hypocontractility an ejection fraction of at least 55%.  Multivessel CAD with old total occlusion of the proximal LAD with left to left and right to left collaterals, mild 20% proximal circumflex and 30% mid OM stenosis; and 30% proximal RCA stenosis with a 95% distal RCA stenosis  proximal to the PDA takeoff with jeopardized collaterals to the LAD.  Successful PCI with PTCA/DES stenting with insertion of a 3.518 mm Xience Alpine DES stent postdilated 3.7 mm the 95% RCA stenosis being reduced to 0%.   RECOMMENDATION:  The patient will continue with dual antiplatelet  therapy for minimum of one-year. Medical therapy will be continued for his concomitant CAD with previously documented old total LAD occlusion with collateralization. The collaterals are now improved following PCI to the RCA.  [name], MD, [facility] 05/08/2014  Treatments: See above  Discharge Exam: Blood pressure 104/71, pulse 71, temperature 98.3 F (36.8 C), temperature source Oral, resp. rate 16, height _0  (1.803 m), weight 191 lb 5.8 oz (86.8 kg), SpO2 96 %.   Disposition: 01-Home or Self Care      Discharge Instructions    Diet - low sodium heart healthy    Complete by:  As directed      Face-to-face encounter (required for Medicare/Medicaid patients)    Complete by:  As directed   I SIMMONS, BRITTAINY certify that this patient is under my care and that I, or a nurse practitioner or physician's assistant working with me, had a face-to-face encounter that meets the physician face-to-face encounter requirements with this patient on 05/11/2014. The encounter with the patient was in whole, or in part for the following medical condition(s) which is the primary reason for home health care (List medical condition): CAD/ change in patient status  The encounter with the patient was in whole, or in part, for the following medical condition, which is the primary reason for home health care:  CAD; functional limitations, decline in patient status  I certify that, based on my findings, the following services are medically necessary home health services:   Nursing Physical therapy    Reason for Medically Necessary Home Health Services:  Skilled Nursing- Change/Decline in Patient Status  My clinical findings support the need for the above services:  Unable to leave home safely without assistance and/or assistive device  Further, I certify that my clinical findings support that this patient is homebound due to:  Unable to leave home safely without assistance     Home Health    Complete by:   As directed   To provide the following care/treatments:   RN PT OT       Increase activity slowly    Complete by:  As directed             Medication List    STOP taking these medications        naproxen sodium 220 MG tablet  Commonly known as:  ANAPROX      TAKE these medications        acetaminophen 500 MG tablet  Commonly known as:  TYLENOL  Take 1,000 mg by mouth every 6 (six) hours as needed (pain).     amLODipine 10 MG tablet  Commonly known as:  NORVASC  Take 5 mg by mouth daily. Take 1/2 tablet For HTN     aspirin 81 MG chewable tablet  Chew 81 mg by mouth daily.     atorvastatin 10 MG tablet  Commonly known as:  LIPITOR  Take 10 mg by mouth daily.     B-12 COMPLIANCE INJECTION IJ  Inject as directed every 30 (thirty) days.     calcium-vitamin D 500-200 MG-UNIT per tablet  Commonly known as:  OSCAL WITH D  Take 2 tablets by mouth daily with breakfast.  clopidogrel 75 MG tablet  Commonly known as:  PLAVIX  Take 1 tablet (75 mg total) by mouth daily.  Start taking on:  05/12/2014     finasteride 5 MG tablet  Commonly known as:  PROSCAR  Take 5 mg by mouth daily.     fish oil-omega-3 fatty acids 1000 MG capsule  Take 2 g by mouth daily.     guaiFENesin-dextromethorphan 100-10 MG/5ML syrup  Commonly known as:  ROBITUSSIN DM  Take 5 mLs by mouth every 4 (four) hours as needed for cough.     hydroxypropyl methylcellulose / hypromellose 2.5 % ophthalmic solution  Commonly known as:  ISOPTO TEARS / GONIOVISC  Place 1 drop into both eyes as needed for dry eyes.     isosorbide mononitrate 60 MG 24 hr tablet  Commonly known as:  IMDUR  Take 1 tablet (60 mg total) by mouth every evening.     levalbuterol 45 MCG/ACT inhaler  Commonly known as:  XOPENEX HFA  Inhale 2 puffs into the lungs every 6 (six) hours as needed for wheezing. He needs a spacer device as well     lisinopril-hydrochlorothiazide 20-12.5 MG per tablet  Commonly known as:   PRINZIDE,ZESTORETIC  Take 1 tablet by mouth 2 (two) times daily.     metoprolol 100 MG tablet  Commonly known as:  LOPRESSOR  Take 100 mg by mouth 2 (two) times daily.     pantoprazole 40 MG tablet  Commonly known as:  PROTONIX  Take 1 tablet (40 mg total) by mouth daily.     tamsulosin 0.4 MG Caps capsule  Commonly known as:  FLOMAX  Take 1 capsule (0.4 mg total) by mouth daily after supper.     traMADol 50 MG tablet  Commonly known as:  ULTRAM  Take 1 tablet (50 mg total) by mouth every 6 (six) hours as needed.       Follow-up Information    Follow up with HAGER, BRYAN, PA-C On 05/14/2014.   Specialty:  Physician Assistant   Why:  9:00 AM   Contact information:   [address] [place] [address] [phone]      Greater than 30 minutes was spent completing the patient's discharge.    SignedRosaria Ferries, PAC 05/11/2014, 11:44 AM

## 2014-05-09 NOTE — Care Management Note (Unsigned)
    Page 1 of 2   05/11/2014     1:01:45 PM CARE MANAGEMENT NOTE 05/11/2014  Patient:  [name] [name], [name] [name]   Account Number:  [id number]  Date Initiated:  05/09/2014  Documentation initiated by:  Whitman Hero  Subjective/Objective Assessment:   PTA  from home admitted with CP, wheelchair bound. Pt with established relationship with [facility] for [facility] services.     Action/Plan:   Return to home when medically stable. CM to f/u with discharge needs.   Anticipated DC Date:  05/11/2014   Anticipated DC Plan:  [place]  In-house referral  Clinical Social Worker      DC Planning Services  CM consult      Choice offered to / List presented to:  C-1 Patient        [place] arranged  HH-1 RN  [place]   Status of service:  Completed, signed off Medicare Important Message given?  NO (If response is "NO", the following Medicare IM given date fields will be blank) Date Medicare IM given:   Medicare IM given by:   Date Additional Medicare IM given:   Additional Medicare IM given by:    Discharge Disposition:  [place]  Per UR Regulation:  Reviewed for med. necessity/level of care/duration of stay  If discussed at [place] of Stay Meetings, dates discussed:    Comments:  05/11/14 [name] RN BSN [phone].  CM contacted [facility] per pt and pt daughter request, referral was accepted.  Per Stanton Kidney with [facility]; therapy will be resumed this weekend post discharge today and Nurse will assess pt on Monday 05/14/14.  CM contacted [facility] in [place] and communicated with [name] regarding pt hospitalization and discharge orders, [facility] confirmed that patient was [facility] with [place].  [facility], therefore nursing aid was requested through [place].  CM faxed via EPIC requested documents to attention; Dr Fausto Skillern fax number  [phone] (H&P, dishcarge summary, pertinent orders). Pt's [facility] [place].  [place] [facility] social worker; [name] [phone] ext [phone].  Both [facility] and [place] were informed that pt will discharge home today.  No additonal CM orders needed at this time.  05/09/2014 @ 0900 Whitman Hero RN,BSN,CM [id number]     emergency contact, [name] (daughter)  [phone] (c) or [phone] (work)

## 2014-05-10 ENCOUNTER — Encounter (HOSPITAL_COMMUNITY): Payer: Self-pay | Admitting: Physician Assistant

## 2014-05-10 DIAGNOSIS — I200 Unstable angina: Secondary | ICD-10-CM | POA: Diagnosis not present

## 2014-05-10 DIAGNOSIS — I2583 Coronary atherosclerosis due to lipid rich plaque: Secondary | ICD-10-CM | POA: Diagnosis not present

## 2014-05-10 DIAGNOSIS — E785 Hyperlipidemia, unspecified: Secondary | ICD-10-CM

## 2014-05-10 DIAGNOSIS — I2582 Chronic total occlusion of coronary artery: Secondary | ICD-10-CM | POA: Diagnosis not present

## 2014-05-10 DIAGNOSIS — I10 Essential (primary) hypertension: Secondary | ICD-10-CM | POA: Diagnosis not present

## 2014-05-10 DIAGNOSIS — E876 Hypokalemia: Secondary | ICD-10-CM | POA: Diagnosis not present

## 2014-05-10 DIAGNOSIS — I2510 Atherosclerotic heart disease of native coronary artery without angina pectoris: Secondary | ICD-10-CM | POA: Diagnosis not present

## 2014-05-10 DIAGNOSIS — I25110 Atherosclerotic heart disease of native coronary artery with unstable angina pectoris: Secondary | ICD-10-CM | POA: Diagnosis not present

## 2014-05-10 LAB — LIPID PANEL
CHOL/HDL RATIO: 3.7 ratio
CHOLESTEROL: 111 mg/dL (ref 0–200)
HDL: 30 mg/dL — AB (ref >39)
LDL Cholesterol: 69 mg/dL (ref 0–99)
Triglycerides: 60 mg/dL (ref <150)
VLDL: 12 mg/dL (ref 0–40)

## 2014-05-10 LAB — BASIC METABOLIC PANEL
ANION GAP: 9 (ref 5–15)
BUN: 16 mg/dL (ref 6–23)
CHLORIDE: 109 mmol/L (ref 96–112)
CO2: 22 mmol/L (ref 19–32)
CREATININE: 1.03 mg/dL (ref 0.50–1.35)
Calcium: 9.4 mg/dL (ref 8.4–10.5)
GFR, EST AFRICAN AMERICAN: 74 mL/min — AB (ref >90)
GFR, EST NON AFRICAN AMERICAN: 64 mL/min — AB (ref >90)
Glucose, Bld: 138 mg/dL — ABNORMAL HIGH (ref 70–99)
Potassium: 3.7 mmol/L (ref 3.5–5.1)
Sodium: 140 mmol/L (ref 135–145)

## 2014-05-10 MED ORDER — POTASSIUM CHLORIDE CRYS ER 20 MEQ PO TBCR: 40.0000 meq | EXTENDED_RELEASE_TABLET | Freq: Once | ORAL | Status: DC

## 2014-05-10 NOTE — Progress Notes (Signed)
CSW spoke with pt and his daughter, Cheri Fowler, at bedside.  Explained that pt did not have a 3 night qualifying stay as an IP and, therefore, Medicare would not pay for pt to go to a NH.  Pt's daughter indicated that she understood this and when asked, stated that pt could not afford to private pay for NH care.  Pt/daughter agreeable to going home at d/c, but had questions and concerns related to him being able to support himself with right arm when transferring via wheelchair, plus some questions related to pt's medications.  RN Vicente Males informed of questions.  Pt/daughter agreeable to resuming [place] with [facility] at d/c.  ED RNCM informed of pending d/c plan and agreeable to arrange [facility] this pm on behalf of pt.

## 2014-05-10 NOTE — Progress Notes (Signed)
Patient Name: [name] [name] Date of Encounter: 05/10/2014  Principal Problem:   Coronary artery disease due to lipid rich plaque Active Problems:   Hypertension   Hyperlipidemia   Primary Cardiologist: Dr. [name]  Patient Profile: 81 year old male with hx LAD 100%, RCA 50%, HTN, neuropathy, was admitted 04/26 for cath, crescendo angina  SUBJECTIVE: Denies chest pain or SOB (resp rate appears elevated); no palpitations or presyncope  OBJECTIVE Filed Vitals:   05/09/14 1949 05/10/14 0011 05/10/14 0403 05/10/14 0800  BP: 132/74 121/70 195/82 147/95  Pulse: 74 59 67 105  Temp: 98 F (36.7 C) 97.7 F (36.5 C) 98 F (36.7 C) 98 F (36.7 C)  TempSrc: Oral Oral Oral Oral  Resp: 27 26 18 18   Height:      Weight:  191 lb 5.8 oz (86.8 kg)    SpO2: 96% 94% 95% 95%    Intake/Output Summary (Last 24 hours) at 05/10/14 0815 Last data filed at 05/10/14 0403  Gross per 24 hour  Intake    120 ml  Output    625 ml  Net   -505 ml   Filed Weights   05/08/14 0703 05/09/14 0005 05/10/14 0011  Weight: 192 lb (87.091 kg) 198 lb 13.7 oz (90.2 kg) 191 lb 5.8 oz (86.8 kg)    PHYSICAL EXAM General: Well developed, well nourished, male in no acute distress. Head: Normocephalic, atraumatic.  Neck: Supple without bruits, JVD 8 cm. Lungs:  Resp regular and unlabored, decreased BS bases. Heart: RRR, S1, S2, no S3, S4, or murmur; no rub. Abdomen: Soft, non-tender, non-distended, BS + x 4.  Extremities: No clubbing, cyanosis, no edema. Right radial cath site is stable Neuro: Alert and oriented X 3. Moves all extremities spontaneously. Psych: Normal affect.  LABS: Basic Metabolic Panel:  Recent Labs  05/08/14 1720  NA 139  K 3.4*  CL 105  CO2 24  GLUCOSE 91  BUN 19  CREATININE 0.93  CALCIUM 8.9  MG 2.0   Cardiac cath results: 05/08/2014 Preserved global LV contractility with mild distal anterolateral hypocontractility an ejection fraction of at least 55%. Multivessel CAD  with old total occlusion of the proximal LAD with left to left and right to left collaterals, mild 20% proximal circumflex and 30% mid OM stenosis; and 30% proximal RCA stenosis with a 95% distal RCA stenosis proximal to the PDA takeoff with jeopardized collaterals to the LAD. Successful PCI with PTCA/DES stenting with insertion of a 3.518 mm Xience Alpine DES stent postdilated 3.7 mm the 95% RCA stenosis being reduced to 0%.  TELE:  Sinus rhythm, sinus tachycardia with heart rate trending up overnight. Occasional brief episodes of possible atrial tach   Current Medications:  . amLODipine  5 mg Oral Daily  . aspirin EC  81 mg Oral Daily  . atorvastatin  80 mg Oral q1800  . lisinopril  20 mg Oral BID   And  . hydrochlorothiazide  12.5 mg Oral BID  . isosorbide mononitrate  60 mg Oral QPM  . metoprolol  100 mg Oral BID  . ticagrelor  90 mg Oral BID   . sodium chloride Stopped (05/08/14 2330)    ASSESSMENT AND PLAN: Principal Problem:   Coronary artery disease due to lipid rich plaque - Continue medical therapy for chronically occluded LAD - Status post DES to the RCA - Continue ASA, ticagrelor, Imdur, ACE inhibitor, beta blocker and statin  Active Problems:   Hypertension - SBP 120s-190s since admission - He is  on home meds - Unable to increase beta blocker since heart rate is 50 at times - Renal function is stable, M.D. advised on increasing ACE inhibitor    Hyperlipidemia - Lipitor has been increased from 10 mg up to 80 mg daily - LFTs were okay earlier this month, we'll check profile    Deconditioning - Physical therapy recommended SNF, social worker is working with family on placement    Increased respiratory rate - Patient denies shortness of breath and oxygen saturations are stable on room air - However respiratory rate appears to increase with conversation - We'll check a chest x-ray, this may be secondary to [place]: Discharge to skilled nursing facility  when medically stable and bed available   Signed, [name] , PA-C 8:15 AM 05/10/2014    Patient seen and examined. Agree with assessment and plan. No recurrent chest pain. Feels better; wheelchair bound .Discussed with coordinators and daughter, may benefit to return to [facility] for short term rehab. If patient experiences more shortness of breath , change brilinta to plavix. Will keep today.   [name], MD, [facility] 05/10/2014 9:34 AM

## 2014-05-10 NOTE — Progress Notes (Signed)
Occupational Therapy Evaluation Patient Details Name: [name] [name] MRN: [id number] DOB: [birth year] Today's Date: 05/10/2014    History of Present Illness 81 yo male with CP, Multivessel CAD with old total occlusion of the proximal LAD with left to left and right to left collaterals, mild 20% proximal circumflex and 30% mid OM stenosis; and 30% proximal RCA stenosis with a 95% distal RCA stenosis proximal to the PDA takeoff with jeopardized collaterals to the LAD. Successful PCI with PTCA/DES stenting    Clinical Impression   PTA, pt lived alone and was mod I with ADL and mobility @ w/c level. Pt with significant functional decline and will benefit from skilled OT services to maximize functional level of independence to facilitate D/C to SNF for rehab.     Follow Up Recommendations  SNF;Supervision/Assistance - 24 hour    Equipment Recommendations  None recommended by OT    Recommendations for Other Services       Precautions / Restrictions Precautions Precautions: Fall Precaution Comments: came in last year with falls Restrictions Weight Bearing Restrictions: No      Mobility Bed Mobility Overal bed mobility: Needs Assistance Bed Mobility: Supine to Sit     Supine to sit: Min assist     General bed mobility comments: Pt reports needing more help with bed mobility and that he uses a rail at home.  Transfers Overall transfer level: Needs assistance   Transfers: Squat Pivot Transfers     Squat pivot transfers: Min assist (from lower surface)     General transfer comment: Min A with squat pivot. Greater difficulty from lower surface. Pt OOB in chair, but had to transer back into bed due to being transferred rooms    Balance     Sitting balance-Leahy Scale: Fair Sitting balance - Comments: posterior LOB. Unable to sit upsupported                                    ADL Overall ADL's : Needs assistance/impaired     Grooming: Set up;Sitting    Upper Body Bathing: Set up;Supervision/ safety;Sitting (supported)   Lower Body Bathing: Moderate assistance;Sitting/lateral leans   Upper Body Dressing : Minimal assistance;Sitting   Lower Body Dressing: Moderate assistance;Sitting/lateral leans   Toilet Transfer: Moderate assistance;Squat-pivot   Toileting- Clothing Manipulation and Hygiene: Moderate assistance;Sitting/lateral lean       Functional mobility during ADLs: Cueing for safety;Minimal assistance       Vision     Perception     Praxis      Pertinent Vitals/Pain  no c/o pain. Vitals stable.      Hand Dominance Right   Extremity/Trunk Assessment Upper Extremity Assessment Upper Extremity Assessment: Generalized weakness   Lower Extremity Assessment Lower Extremity Assessment: Defer to PT evaluation   Cervical / Trunk Assessment Cervical / Trunk Assessment: Kyphotic   Communication Communication Communication: No difficulties (heavy accent)   Cognition Arousal/Alertness: Awake/alert Behavior During Therapy: WFL for tasks assessed/performed Overall Cognitive Status: Within Functional Limits for tasks assessed                     General Comments   Very pleasant and appreciative of help.    Exercises       Shoulder Instructions      Home Living Family/patient expects to be discharged to:: Private residence Living Arrangements: Alone Available Help at Discharge: Family;Available PRN/intermittently Type of Home: [place]  Access: Ramped entrance     Home Layout: Two level;Able to live on main level with bedroom/bathroom     Bathroom Shower/Tub: Tub/shower unit Shower/tub characteristics: Architectural technologist: Standard Bathroom Accessibility: Yes How Accessible: Accessible via wheelchair Home Equipment: [place] - 4 wheels;Wheelchair - manual;Grab bars - toilet;Grab bars - tub/shower;Tub bench   Additional Comments: pt reports that he no longer walks. Per chart in 3/15, was  walking in community with RW. Transfers in and out of w/c and uses arms and legs to propel self      Prior Functioning/Environment Level of Independence: Independent with assistive device(s)        Comments: was receiving [facility]    OT Diagnosis: Generalized weakness   OT Problem List: Decreased strength;Impaired balance (sitting and/or standing);Decreased activity tolerance;Decreased safety awareness;Impaired sensation;Pain   OT Treatment/Interventions: Self-care/ADL training;Therapeutic exercise;DME and/or AE instruction;Therapeutic activities;Patient/family education;Balance training    OT Goals(Current goals can be found in the care plan section) Acute Rehab OT Goals Patient Stated Goal: return home OT Goal Formulation: With patient Time For Goal Achievement: 05/24/14 Potential to Achieve Goals: Good ADL Goals Pt Will Perform Lower Body Bathing: with set-up;sitting/lateral leans;with supervision Pt Will Perform Upper Body Dressing: with set-up;with supervision;sitting Pt Will Transfer to Toilet: with supervision;bedside commode;squat pivot transfer Pt Will Perform Toileting - Clothing Manipulation and hygiene: with supervision;sitting/lateral leans  OT Frequency: Min 2X/week   Barriers to D/C:            Co-evaluation              End of Session Equipment Utilized During Treatment: Gait belt Nurse Communication: Mobility status  Activity Tolerance: Patient limited by fatigue Patient left: in bed;with call bell/phone within reach;with nursing/sitter in room   Time: 1340-1415 OT Time Calculation (min): 35 min Charges:  OT General Charges $OT Visit: 1 Procedure OT Evaluation $Initial OT Evaluation Tier I: 1 Procedure OT Treatments $Self Care/Home Management : 8-22 mins G-Codes:    Santino Kinsella,HILLARY 06/07/14, 2:48 PM   [facility], OTR/L  [phone] 06/07/14

## 2014-05-10 NOTE — Progress Notes (Addendum)
1521: Staffed case with Director of CSW with regards to patient receiving an LOG for skilled benefit.  Per review of chart, patient in obs status, wheelchair bound, and needing more supervision rather than skilled care, CSW Director has not approved LOG.  Director has asked to call Dr. Reynaldo Minium regarding case in effort to understand his intentions of LOG and length of time needed at [facility]. [place] called back and patient can come on LOG per administration, however if patient is denied LOG he will have to pay out of pocket. CM Levada Dy called several times, message left.   Evening CSW :Jeral Fruit will handle case/disposition.     1449: Update on Placement: LCSW made aware patient is to discharge today on a LOG as he is not meeting criteria for inpatient. Patient has not had his 3 midnight stay, thus approval needed from SNF if they will take an LOG. Hoyle Sauer called at [facility] with regards to accepting an LOG. Administrator must approve the LOG thus awaiting call back to see if they will accept. Therapist, music of [facility]) for approval of the LOG as patient requires therapy PT/OT, awaiting call back. AD contacted, no answer. Family not on unit, will need to be contacted as Ron[facility] Lion may deny patient due to payor status.  Will see if family is open to other options.   LCSW covering for unit CSW for placement. Patient just made inpatient status today and still needing a 3 day qualifying stay prior to be admitted into a SNF. LCSW has discussed case with CM: Angela. Patient has been accepted to [facility] per CFP.  LCSW called and left message for [place] at [place].   Awaiting CM to call back and confirm we can hold patient for 3 midnight stays for placement.  Lane Hacker, MSW Clinical Social Work: Emergency Room [phone]

## 2014-05-11 DIAGNOSIS — I200 Unstable angina: Secondary | ICD-10-CM | POA: Diagnosis not present

## 2014-05-11 DIAGNOSIS — I25110 Atherosclerotic heart disease of native coronary artery with unstable angina pectoris: Secondary | ICD-10-CM | POA: Diagnosis not present

## 2014-05-11 DIAGNOSIS — E785 Hyperlipidemia, unspecified: Secondary | ICD-10-CM | POA: Diagnosis not present

## 2014-05-11 DIAGNOSIS — I2582 Chronic total occlusion of coronary artery: Secondary | ICD-10-CM | POA: Diagnosis not present

## 2014-05-11 DIAGNOSIS — I10 Essential (primary) hypertension: Secondary | ICD-10-CM | POA: Diagnosis not present

## 2014-05-11 DIAGNOSIS — I2583 Coronary atherosclerosis due to lipid rich plaque: Secondary | ICD-10-CM | POA: Diagnosis not present

## 2014-05-11 DIAGNOSIS — E876 Hypokalemia: Secondary | ICD-10-CM | POA: Diagnosis not present

## 2014-05-11 LAB — BASIC METABOLIC PANEL
Anion gap: 9 (ref 5–15)
BUN: 23 mg/dL (ref 6–23)
CHLORIDE: 106 mmol/L (ref 96–112)
CO2: 23 mmol/L (ref 19–32)
Calcium: 9.1 mg/dL (ref 8.4–10.5)
Creatinine, Ser: 1.25 mg/dL (ref 0.50–1.35)
GFR calc Af Amer: 58 mL/min — ABNORMAL LOW (ref >90)
GFR, EST NON AFRICAN AMERICAN: 50 mL/min — AB (ref >90)
GLUCOSE: 102 mg/dL — AB (ref 70–99)
Potassium: 3.7 mmol/L (ref 3.5–5.1)
SODIUM: 138 mmol/L (ref 135–145)

## 2014-05-11 MED ORDER — CLOPIDOGREL BISULFATE 300 MG PO TABS
300.0000 mg | ORAL_TABLET | Freq: Once | ORAL | Status: AC
  Administered 2014-05-11: 300 mg via ORAL
  Filled 2014-05-11: qty 1

## 2014-05-11 MED ORDER — CLOPIDOGREL BISULFATE 75 MG PO TABS: 75.0000 mg | ORAL_TABLET | Freq: Every day | ORAL | Status: DC

## 2014-05-11 MED ORDER — PANTOPRAZOLE SODIUM 40 MG PO TBEC: 40.0000 mg | DELAYED_RELEASE_TABLET | Freq: Every day | ORAL | Status: DC

## 2014-05-11 MED ORDER — ISOSORBIDE MONONITRATE ER 60 MG PO TB24: 60.0000 mg | ORAL_TABLET | Freq: Every evening | ORAL | Status: DC

## 2014-05-11 NOTE — Progress Notes (Signed)
Pt discharged via son-in-law via personal wheelchair. No c/o pain or SOB. Pt IV removed and tolerated well. Care plans complete and education done. VA papers and prescriptions given to family. Pt has no issues at this time. Leanne Chang, RN

## 2014-05-11 NOTE — Progress Notes (Signed)
Physical Therapy Treatment Patient Details Name: [name] [name] MRN: [id number] DOB: 1925/07/20 Today's Date: 05/11/2014    History of Present Illness 81 yo male with CP, Multivessel CAD with old total occlusion of the proximal LAD with left to left and right to left collaterals, mild 20% proximal circumflex and 30% mid OM stenosis; and 30% proximal RCA stenosis with a 95% distal RCA stenosis proximal to the PDA takeoff with jeopardized collaterals to the LAD. Successful PCI with PTCA/DES stenting     PT Comments    Pt progressing towards physical therapy goals. Pt/family education provided about general safety at home with mobility. Strongly encouraged pt NOT to go out and sit on the ground to garden like he has been doing, until strength returns and family or therapist is present for transfers. Continue to recommend SNF at d/c for follow-up therapy, however noted that pt will be returning home due to lack of insurance coverage. Will continue to follow and progress as able per POC. Will need HHPT to follow closely if returning home.   Follow Up Recommendations  SNF;Supervision for mobility/OOB     Equipment Recommendations  None recommended by PT    Recommendations for Other Services       Precautions / Restrictions Precautions Precautions: Fall Precaution Comments: came in last year with falls Restrictions Weight Bearing Restrictions: No    Mobility  Bed Mobility               General bed mobility comments: Pt sitting EOB when PT arrived  Transfers Overall transfer level: Needs assistance Equipment used: Rolling walker (2 wheeled) Transfers: Sit to/from [facility] Sit to Stand: Min guard Stand pivot transfers: Min guard       General transfer comment: Pt was able to complete transfer to w/c without physical assist. Close guard for balance. Pt states he usually does not use a walker at home for transfers however has one if he needs it.    Ambulation/Gait             General Gait Details: NT   Stairs            Wheelchair Mobility    Modified Rankin (Stroke Patients Only)       Balance Overall balance assessment: Needs assistance Sitting-balance support: Feet supported;No upper extremity supported Sitting balance-Leahy Scale: Fair                              Cognition Arousal/Alertness: Awake/alert Behavior During Therapy: WFL for tasks assessed/performed Overall Cognitive Status: Within Functional Limits for tasks assessed                      Exercises      General Comments        Pertinent Vitals/Pain Pain Assessment: No/denies pain    Home Living                      Prior Function            PT Goals (current goals can now be found in the care plan section) Acute Rehab PT Goals Patient Stated Goal: return home PT Goal Formulation: With patient/family Time For Goal Achievement: 05/23/14 Potential to Achieve Goals: Good Progress towards PT goals: Progressing toward goals    Frequency  Min 3X/week    PT Plan Current plan remains appropriate    Co-evaluation  End of Session Equipment Utilized During Treatment: Gait belt Activity Tolerance: Patient tolerated treatment well Patient left: in chair;with call bell/phone within reach;with family/visitor present     Time: [id number] PT Time Calculation (min) (ACUTE ONLY): 24 min  Charges:  $Therapeutic Activity: 23-37 mins                    G Codes:      Rolinda Roan 05/28/2014, 3:07 PM  Rolinda Roan, PT, DPT Acute Rehabilitation Services Pager: [phone]

## 2014-05-11 NOTE — Progress Notes (Signed)
    Subjective:  No chest pain or shortness of breath. However, some "panting" noted.  Objective:  Vital Signs in the last 24 hours: Temp:  [98 F (36.7 C)-98.3 F (36.8 C)] 98.3 F (36.8 C) (04/29 0530) Pulse Rate:  [73-105] 77 (04/29 0530) Resp:  [18] 18 (04/29 0530) BP: (104-183)/(69-106) 115/81 mmHg (04/29 0530) SpO2:  [95 %-99 %] 97 % (04/29 0530)  Intake/Output from previous day: 04/28 0701 - 04/29 0700 In: 100 [P.O.:100] Out: 300 [Urine:300]  Physical Exam: Pt is alert and oriented, pleasant elderly male in NAD HEENT: normal Neck: JVP - normal Lungs: CTA bilaterally CV: RRR without murmur or gallop Abd: soft, NT, Positive BS, no hepatomegaly Ext: no C/C/E, distal pulses intact and equal. Right groin with mild ecchymoses. Right radial site clear. Skin: warm/dry no rash   Lab Results: No results for input(s): WBC, HGB, PLT in the last 72 hours.  Recent Labs  05/10/14 1015 05/11/14 0615  NA 140 138  K 3.7 3.7  CL 109 106  CO2 22 23  GLUCOSE 138* 102*  BUN 16 23  CREATININE 1.03 1.25   No results for input(s): TROPONINI in the last 72 hours.  Invalid input(s): CK, MB  Tele: Sinus rhythm  Assessment/Plan:  1. Unstable angina pectoris: Patient now status post PCI of the right coronary artery with a drug-eluting stent. Plans noted for medical management of his chronically occluded LAD. He appears to be somewhat short of breath with brilinta and his daughter informs me he will not be able to get this through the [place] medical system. I'm going to switch him to clopidogrel this morning. Otherwise he will be discharged on his current medical program.  2. Essential hypertension: Controlled on medical therapy  3. Hyperlipidemia: Lipids reviewed and LDL cholesterol is 69 on a statin drug.  Disposition: The patient did not qualify for skilled nursing. He will be discharged home. Will arrange home health services.   Sherren Mocha, M.D. 05/11/2014, 7:44  AM

## 2014-05-11 NOTE — Progress Notes (Signed)
CARE MANAGEMENT NOTE 05/11/2014  Patient:  [name] [name], [name] [name]   Account Number:  [id number]  Date Initiated:  05/09/2014  Documentation initiated by:  COLE,ANGELA  Subjective/Objective Assessment:   PTA  from home admitted with CP, wheelchair bound. Pt with established relationship with [facility] for [facility] services.     Action/Plan:   Return to home when medically stable. CM to f/u with discharge needs.   Anticipated DC Date:  05/11/2014   Anticipated DC Plan:  [place]  In-house referral  Clinical Social Worker      DC Planning Services  CM consult      Choice offered to / List presented to:  C-1 Patient        [place] arranged  HH-1 RN  [place]   Status of service:  Completed, signed off Medicare Important Message given?  NO (If response is "NO", the following Medicare IM given date fields will be blank) Date Medicare IM given:   Medicare IM given by:   Date Additional Medicare IM given:   Additional Medicare IM given by:    Discharge Disposition:  [place]  Per UR Regulation:  Reviewed for med. necessity/level of care/duration of stay  If discussed at [place] of Stay Meetings, dates discussed:    Comments:  05/11/14 [name] RN BSN [phone].  CM contacted [facility] for [facility] per pt and pt daughter request, referral was accepted.  Per Stanton Kidney with [facility]; therapy will be resumed this weekend post discharge today and Nurse will assess pt on Monday 05/14/14.  CM contacted [facility] in [place] and communicated with [name] regarding pt hospitalization and discharge orders, [facility] confirmed that patient was active with [place].  [facility] on waiting list for nursing aide, therefore nursing aid was requested through [place].  CM faxed via EPIC requested documents to attention; Dr Fausto Skillern fax number [phone] (H&P, dishcarge summary,  pertinent orders). Pt's PCP [facility] [place].  [place] [facility] social worker; [name] [phone] ext [phone].  Both [facility] and [place] were informed that pt will discharge home today.  No additonal CM orders needed at this time.  05/09/2014 @ 0900 Whitman Hero RN,BSN,CM [id number]     emergency contact, [name] [name] (daughter)  [phone] (c) or [phone] (work)

## 2014-05-11 NOTE — Clinical Social Work Note (Signed)
CSW received referral for SNF.  Patient did not have a qualifying stay at nursing home, and does not have funds to pay privately for SNF.  Plan is to discharge home with home health.  CSW to sign off please re-consult if social work needs arise.  [name] [name]. Muir, MSW, [place]

## 2014-05-13 DIAGNOSIS — R296 Repeated falls: Secondary | ICD-10-CM | POA: Diagnosis not present

## 2014-05-13 DIAGNOSIS — G629 Polyneuropathy, unspecified: Secondary | ICD-10-CM | POA: Diagnosis not present

## 2014-05-13 DIAGNOSIS — I739 Peripheral vascular disease, unspecified: Secondary | ICD-10-CM | POA: Diagnosis not present

## 2014-05-13 DIAGNOSIS — M150 Primary generalized (osteo)arthritis: Secondary | ICD-10-CM | POA: Diagnosis not present

## 2014-05-13 DIAGNOSIS — I2510 Atherosclerotic heart disease of native coronary artery without angina pectoris: Secondary | ICD-10-CM | POA: Diagnosis not present

## 2014-05-13 DIAGNOSIS — I10 Essential (primary) hypertension: Secondary | ICD-10-CM | POA: Diagnosis not present

## 2014-05-14 ENCOUNTER — Ambulatory Visit (INDEPENDENT_AMBULATORY_CARE_PROVIDER_SITE_OTHER): Payer: Medicare Other | Admitting: Physician Assistant

## 2014-05-14 ENCOUNTER — Encounter: Payer: Self-pay | Admitting: Physician Assistant

## 2014-05-14 ENCOUNTER — Telehealth: Payer: Self-pay | Admitting: Cardiovascular Disease

## 2014-05-14 VITALS — BP 104/56 | HR 64 | Ht 71.0 in | Wt 190.2 lb

## 2014-05-14 DIAGNOSIS — I2510 Atherosclerotic heart disease of native coronary artery without angina pectoris: Secondary | ICD-10-CM

## 2014-05-14 DIAGNOSIS — I10 Essential (primary) hypertension: Secondary | ICD-10-CM | POA: Diagnosis not present

## 2014-05-14 DIAGNOSIS — I209 Angina pectoris, unspecified: Secondary | ICD-10-CM | POA: Diagnosis not present

## 2014-05-14 DIAGNOSIS — I2583 Coronary atherosclerosis due to lipid rich plaque: Secondary | ICD-10-CM

## 2014-05-14 DIAGNOSIS — R531 Weakness: Secondary | ICD-10-CM | POA: Diagnosis not present

## 2014-05-14 DIAGNOSIS — I200 Unstable angina: Secondary | ICD-10-CM

## 2014-05-14 DIAGNOSIS — M150 Primary generalized (osteo)arthritis: Secondary | ICD-10-CM | POA: Diagnosis not present

## 2014-05-14 DIAGNOSIS — Z951 Presence of aortocoronary bypass graft: Secondary | ICD-10-CM

## 2014-05-14 DIAGNOSIS — E785 Hyperlipidemia, unspecified: Secondary | ICD-10-CM

## 2014-05-14 DIAGNOSIS — Z955 Presence of coronary angioplasty implant and graft: Secondary | ICD-10-CM

## 2014-05-14 DIAGNOSIS — I739 Peripheral vascular disease, unspecified: Secondary | ICD-10-CM | POA: Diagnosis not present

## 2014-05-14 DIAGNOSIS — R296 Repeated falls: Secondary | ICD-10-CM | POA: Diagnosis not present

## 2014-05-14 DIAGNOSIS — G629 Polyneuropathy, unspecified: Secondary | ICD-10-CM | POA: Diagnosis not present

## 2014-05-14 NOTE — Telephone Encounter (Signed)
Message sent to Dr.Kelly for advice. 

## 2014-05-14 NOTE — Progress Notes (Signed)
Patient ID: [name] [name], male   DOB: 1925-06-09, 81 y.o.   MRN: [id number]    Date:  05/14/2014   ID:  [name] [name], DOB 09-18-1925, MRN [id number]  PCP:  [name], MD  Primary Cardiologist:  Croitoru  Chief Complaint  Patient presents with  . Follow-up    from hospital; had stent put in; pt states no chest pain, lightheadedness, swelling     History of Present Illness: [name] [name] is a 81 y.o. male  [name] [name] is a 81 y.o. male who presented in the office for recent episodes of chest discomfort at rest over last month that wake him from sleep, improved after initiation of long acting nitrates, but still occurring on an occasional basis. His daughter accompanied him. Also has GERD (on chronic PPI, symptoms usually different).  He has known CAD with chronic total occlusion of the LAD artery following the takeoff of the first diagonal artery. The distal LAD receives collateral flow via the right coronary artery which by previous coronary angiography has a 50% smooth proximal stenosis. His last angiogram was performed in December of 2011 and medical therapy was recommended. A nuclear perfusion study performed in 2009 shows an extensive anteroapical defect with extensive reversibility. Left ventricular systolic function is borderline with an ejection fraction of 50%. He has mild aortic insufficiency and a mildly dilated left atrium but otherwise no significant structural heart disease.   He is quite sedentary due to severe foot neuropathy and is essentially nonambulatory. 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   He was admitted for a left heart cath which revealed preserved global LV contractility with mild distal anterolateral hypocontractility an ejection fraction of at least 55%. Multivessel CAD with old total occlusion of the proximal LAD with left to left and right to left collaterals, mild 20% proximal circumflex and  30% mid OM stenosis; and 30% proximal RCA stenosis with a 95% distal RCA stenosis proximal to the PDA takeoff with jeopardized collaterals to the LAD. He underwent successful PCI with PTCA/DES stenting with insertion of a 3.518 mm Xience Alpine DES stent postdilated 3.7 mm the 95% RCA stenosis being reduced to 0%. ASA/plavix. Post cath Scr WNL.   BP poorly controlled so home lopressor, ACE, amlodipine continued. We replaced K+. He lives alone and was pulling himself around in a wheel chair.   A physical therapy consult was called and skilled nursing placement was recommended. Social Work discussed this with the family and placement was pursued. He is felt to have significant deconditioning and hopefully will improve as an outpatient. However, due to insurance issues, he wwent home with home health.  The patient presents for follow up.  He says he feels a lot better.  PT is coming to his house.  He does have some SOB of and on.  No angina.   + bruising in the right groin.    The patient currently denies nausea, vomiting, fever, chest pain, orthopnea, dizziness, PND, cough, congestion, abdominal pain, hematochezia, melena, lower extremity edema, claudication.  Wt Readings from Last 3 Encounters:  05/14/14 190 lb 3.2 oz (86.274 kg)  05/10/14 191 lb 5.8 oz (86.8 kg)  04/29/14 193 lb 6 oz (87.714 kg)     Past Medical History  Diagnosis Date  . Hypertension   . Osteoporosis   . GERD (gastroesophageal reflux disease)   . Neuropathy   . Skin cancer of nose   .  Coronary artery disease   . High cholesterol   . Anginal pain   . Arthritis     "all over"  . Hyperthyroidism   . Depression ~ 1959; ~ 1990    Current Outpatient Prescriptions  Medication Sig Dispense Refill  . acetaminophen (TYLENOL) 500 MG tablet Take 1,000 mg by mouth every 6 (six) hours as needed (pain).    [name] amLODipine (NORVASC) 10 MG tablet Take 5 mg by mouth daily. Take 1/2 tablet For HTN    . atorvastatin (LIPITOR) 10  MG tablet Take 10 mg by mouth daily.    . calcium-vitamin D (OSCAL WITH D) 500-200 MG-UNIT per tablet Take 2 tablets by mouth daily with breakfast.    . clopidogrel (PLAVIX) 75 MG tablet Take 1 tablet (75 mg total) by mouth daily. 30 tablet 11  . Cyanocobalamin (B-12 COMPLIANCE INJECTION IJ) Inject as directed every 30 (thirty) days.    . finasteride (PROSCAR) 5 MG tablet Take 5 mg by mouth daily.    . fish oil-omega-3 fatty acids 1000 MG capsule Take 2 g by mouth daily.    [name] guaiFENesin-dextromethorphan (ROBITUSSIN DM) 100-10 MG/5ML syrup Take 5 mLs by mouth every 4 (four) hours as needed for cough.    . hydroxypropyl methylcellulose / hypromellose (ISOPTO TEARS / GONIOVISC) 2.5 % ophthalmic solution Place 1 drop into both eyes as needed for dry eyes.    . isosorbide mononitrate (IMDUR) 60 MG 24 hr tablet Take 1 tablet (60 mg total) by mouth every evening. 30 tablet 11  . levalbuterol (XOPENEX HFA) 45 MCG/ACT inhaler Inhale 2 puffs into the lungs every 6 (six) hours as needed for wheezing. He needs a spacer device as well 1 Inhaler 12  . lisinopril-hydrochlorothiazide (PRINZIDE,ZESTORETIC) 20-12.5 MG per tablet Take 1 tablet by mouth 2 (two) times daily.    . metoprolol (LOPRESSOR) 100 MG tablet Take 100 mg by mouth 2 (two) times daily.    . pantoprazole (PROTONIX) 40 MG tablet Take 1 tablet (40 mg total) by mouth daily. 30 tablet 11  . tamsulosin (FLOMAX) 0.4 MG CAPS capsule Take 1 capsule (0.4 mg total) by mouth daily after supper. 30 capsule 0  . traMADol (ULTRAM) 50 MG tablet Take 1 tablet (50 mg total) by mouth every 6 (six) hours as needed. (Patient taking differently: Take 50 mg by mouth every 6 (six) hours as needed for moderate pain. ) 25 tablet 0   No current facility-administered medications for this visit.    Allergies:    Allergies  Allergen Reactions  . Cortisone     Causes emotional problems     Social History:  The patient  reports that he quit smoking about 52 years ago.  His smoking use included Cigarettes. He has a 20 pack-year smoking history. He has never used smokeless tobacco. He reports that he drinks alcohol. He reports that he does not use illicit drugs.   Family history:  No family history on file.  ROS:  Please see the history of present illness.  All other systems reviewed and negative.   PHYSICAL EXAM: VS:  BP 104/56 mmHg  Pulse 64  Ht 5\' 11"  (1.803 m)  Wt 190 lb 3.2 oz (86.274 kg)  BMI 26.54 kg/m2 Well nourished, well developed, in no acute distress HEENT: Pupils are equal round react to light accommodation extraocular movements are intact.  Neck: no JVDNo cervical lymphadenopathy. Cardiac: Regular rate and rhythm without murmurs rubs or gallops. Lungs:  clear to auscultation bilaterally, no wheezing,  rhonchi or rales Abd: soft, nontender, positive bowel sounds all quadrants, no hepatosplenomegaly Ext: no lower extremity edema.  2+ radial and dorsalis pedis pulses. Skin: warm and dry Neuro:  Grossly normal  EKG:   SR 64,  RBBB, 1st deg AVB  ASSESSMENT AND PLAN:  Problem List Items Addressed This Visit    Weakness    Home PT seeing      Hypertension    BP controlled       Hyperlipidemia    On statin      Coronary artery disease due to lipid rich plaque    No complaints of angina.  Imdur, BB      CAD (coronary artery disease) - Primary   Relevant Orders   EKG 12-Lead    Other Visit Diagnoses    Recurrent angina status post coronary stent placement        Relevant Orders    EKG 12-Lead       FU as scheduled

## 2014-05-14 NOTE — Telephone Encounter (Signed)
Mechele Claude called in stating that she needs the pt's frequency of care to be approved. She states that she wants to go 3 times this week and then 2 times a wk for 4 wks. She says that is only 5 wks of care because that is the end of his medicare certification period. She would also like to request that a social go to the house for additional aid. Please f/u with Mechele Claude

## 2014-05-14 NOTE — Assessment & Plan Note (Addendum)
No complaints of angina.  Imdur, BB

## 2014-05-14 NOTE — Patient Instructions (Signed)
Keep appointment with Dr. [name] 06/01/14.

## 2014-05-14 NOTE — Assessment & Plan Note (Signed)
On statin.

## 2014-05-14 NOTE — Assessment & Plan Note (Signed)
Home PT seeing

## 2014-05-14 NOTE — Assessment & Plan Note (Signed)
B/P controlled 

## 2014-05-15 DIAGNOSIS — I10 Essential (primary) hypertension: Secondary | ICD-10-CM | POA: Diagnosis not present

## 2014-05-15 DIAGNOSIS — R296 Repeated falls: Secondary | ICD-10-CM | POA: Diagnosis not present

## 2014-05-15 DIAGNOSIS — I2510 Atherosclerotic heart disease of native coronary artery without angina pectoris: Secondary | ICD-10-CM | POA: Diagnosis not present

## 2014-05-15 DIAGNOSIS — M150 Primary generalized (osteo)arthritis: Secondary | ICD-10-CM | POA: Diagnosis not present

## 2014-05-15 DIAGNOSIS — I739 Peripheral vascular disease, unspecified: Secondary | ICD-10-CM | POA: Diagnosis not present

## 2014-05-15 DIAGNOSIS — G629 Polyneuropathy, unspecified: Secondary | ICD-10-CM | POA: Diagnosis not present

## 2014-05-16 DIAGNOSIS — M150 Primary generalized (osteo)arthritis: Secondary | ICD-10-CM | POA: Diagnosis not present

## 2014-05-16 DIAGNOSIS — I2510 Atherosclerotic heart disease of native coronary artery without angina pectoris: Secondary | ICD-10-CM | POA: Diagnosis not present

## 2014-05-16 DIAGNOSIS — I739 Peripheral vascular disease, unspecified: Secondary | ICD-10-CM | POA: Diagnosis not present

## 2014-05-16 DIAGNOSIS — R296 Repeated falls: Secondary | ICD-10-CM | POA: Diagnosis not present

## 2014-05-16 DIAGNOSIS — G629 Polyneuropathy, unspecified: Secondary | ICD-10-CM | POA: Diagnosis not present

## 2014-05-16 DIAGNOSIS — I10 Essential (primary) hypertension: Secondary | ICD-10-CM | POA: Diagnosis not present

## 2014-05-18 DIAGNOSIS — R296 Repeated falls: Secondary | ICD-10-CM | POA: Diagnosis not present

## 2014-05-18 DIAGNOSIS — I739 Peripheral vascular disease, unspecified: Secondary | ICD-10-CM | POA: Diagnosis not present

## 2014-05-18 DIAGNOSIS — I2510 Atherosclerotic heart disease of native coronary artery without angina pectoris: Secondary | ICD-10-CM | POA: Diagnosis not present

## 2014-05-18 DIAGNOSIS — I10 Essential (primary) hypertension: Secondary | ICD-10-CM | POA: Diagnosis not present

## 2014-05-18 DIAGNOSIS — G629 Polyneuropathy, unspecified: Secondary | ICD-10-CM | POA: Diagnosis not present

## 2014-05-18 DIAGNOSIS — M150 Primary generalized (osteo)arthritis: Secondary | ICD-10-CM | POA: Diagnosis not present

## 2014-05-21 DIAGNOSIS — I10 Essential (primary) hypertension: Secondary | ICD-10-CM | POA: Diagnosis not present

## 2014-05-21 DIAGNOSIS — I2510 Atherosclerotic heart disease of native coronary artery without angina pectoris: Secondary | ICD-10-CM | POA: Diagnosis not present

## 2014-05-21 DIAGNOSIS — G629 Polyneuropathy, unspecified: Secondary | ICD-10-CM | POA: Diagnosis not present

## 2014-05-21 DIAGNOSIS — R296 Repeated falls: Secondary | ICD-10-CM | POA: Diagnosis not present

## 2014-05-21 DIAGNOSIS — M150 Primary generalized (osteo)arthritis: Secondary | ICD-10-CM | POA: Diagnosis not present

## 2014-05-21 DIAGNOSIS — I739 Peripheral vascular disease, unspecified: Secondary | ICD-10-CM | POA: Diagnosis not present

## 2014-05-21 NOTE — Telephone Encounter (Signed)
Has this been taken care of?

## 2014-05-22 DIAGNOSIS — G629 Polyneuropathy, unspecified: Secondary | ICD-10-CM | POA: Diagnosis not present

## 2014-05-22 DIAGNOSIS — I2510 Atherosclerotic heart disease of native coronary artery without angina pectoris: Secondary | ICD-10-CM | POA: Diagnosis not present

## 2014-05-22 DIAGNOSIS — R296 Repeated falls: Secondary | ICD-10-CM | POA: Diagnosis not present

## 2014-05-22 DIAGNOSIS — I10 Essential (primary) hypertension: Secondary | ICD-10-CM | POA: Diagnosis not present

## 2014-05-22 DIAGNOSIS — I739 Peripheral vascular disease, unspecified: Secondary | ICD-10-CM | POA: Diagnosis not present

## 2014-05-22 DIAGNOSIS — M150 Primary generalized (osteo)arthritis: Secondary | ICD-10-CM | POA: Diagnosis not present

## 2014-05-23 DIAGNOSIS — M150 Primary generalized (osteo)arthritis: Secondary | ICD-10-CM | POA: Diagnosis not present

## 2014-05-23 DIAGNOSIS — I739 Peripheral vascular disease, unspecified: Secondary | ICD-10-CM | POA: Diagnosis not present

## 2014-05-23 DIAGNOSIS — I2510 Atherosclerotic heart disease of native coronary artery without angina pectoris: Secondary | ICD-10-CM | POA: Diagnosis not present

## 2014-05-23 DIAGNOSIS — G629 Polyneuropathy, unspecified: Secondary | ICD-10-CM | POA: Diagnosis not present

## 2014-05-23 DIAGNOSIS — I10 Essential (primary) hypertension: Secondary | ICD-10-CM | POA: Diagnosis not present

## 2014-05-23 DIAGNOSIS — R296 Repeated falls: Secondary | ICD-10-CM | POA: Diagnosis not present

## 2014-05-25 DIAGNOSIS — G629 Polyneuropathy, unspecified: Secondary | ICD-10-CM | POA: Diagnosis not present

## 2014-05-25 DIAGNOSIS — M150 Primary generalized (osteo)arthritis: Secondary | ICD-10-CM | POA: Diagnosis not present

## 2014-05-25 DIAGNOSIS — R296 Repeated falls: Secondary | ICD-10-CM | POA: Diagnosis not present

## 2014-05-25 DIAGNOSIS — I739 Peripheral vascular disease, unspecified: Secondary | ICD-10-CM | POA: Diagnosis not present

## 2014-05-25 DIAGNOSIS — I10 Essential (primary) hypertension: Secondary | ICD-10-CM | POA: Diagnosis not present

## 2014-05-25 DIAGNOSIS — I2510 Atherosclerotic heart disease of native coronary artery without angina pectoris: Secondary | ICD-10-CM | POA: Diagnosis not present

## 2014-05-25 NOTE — Telephone Encounter (Signed)
Can this encounter be closed?

## 2014-05-27 NOTE — Telephone Encounter (Signed)
I do not know if this was approved

## 2014-05-28 DIAGNOSIS — I10 Essential (primary) hypertension: Secondary | ICD-10-CM | POA: Diagnosis not present

## 2014-05-28 DIAGNOSIS — I2510 Atherosclerotic heart disease of native coronary artery without angina pectoris: Secondary | ICD-10-CM | POA: Diagnosis not present

## 2014-05-28 DIAGNOSIS — R296 Repeated falls: Secondary | ICD-10-CM | POA: Diagnosis not present

## 2014-05-28 DIAGNOSIS — M150 Primary generalized (osteo)arthritis: Secondary | ICD-10-CM | POA: Diagnosis not present

## 2014-05-28 DIAGNOSIS — G629 Polyneuropathy, unspecified: Secondary | ICD-10-CM | POA: Diagnosis not present

## 2014-05-28 DIAGNOSIS — I739 Peripheral vascular disease, unspecified: Secondary | ICD-10-CM | POA: Diagnosis not present

## 2014-05-29 DIAGNOSIS — I10 Essential (primary) hypertension: Secondary | ICD-10-CM | POA: Diagnosis not present

## 2014-05-29 DIAGNOSIS — M150 Primary generalized (osteo)arthritis: Secondary | ICD-10-CM | POA: Diagnosis not present

## 2014-05-29 DIAGNOSIS — R296 Repeated falls: Secondary | ICD-10-CM | POA: Diagnosis not present

## 2014-05-29 DIAGNOSIS — G629 Polyneuropathy, unspecified: Secondary | ICD-10-CM | POA: Diagnosis not present

## 2014-05-29 DIAGNOSIS — I2510 Atherosclerotic heart disease of native coronary artery without angina pectoris: Secondary | ICD-10-CM | POA: Diagnosis not present

## 2014-05-29 DIAGNOSIS — I739 Peripheral vascular disease, unspecified: Secondary | ICD-10-CM | POA: Diagnosis not present

## 2014-05-30 DIAGNOSIS — I739 Peripheral vascular disease, unspecified: Secondary | ICD-10-CM | POA: Diagnosis not present

## 2014-05-30 DIAGNOSIS — I2510 Atherosclerotic heart disease of native coronary artery without angina pectoris: Secondary | ICD-10-CM | POA: Diagnosis not present

## 2014-05-30 DIAGNOSIS — R296 Repeated falls: Secondary | ICD-10-CM | POA: Diagnosis not present

## 2014-05-30 DIAGNOSIS — G629 Polyneuropathy, unspecified: Secondary | ICD-10-CM | POA: Diagnosis not present

## 2014-05-30 DIAGNOSIS — I10 Essential (primary) hypertension: Secondary | ICD-10-CM | POA: Diagnosis not present

## 2014-05-30 DIAGNOSIS — M150 Primary generalized (osteo)arthritis: Secondary | ICD-10-CM | POA: Diagnosis not present

## 2014-06-01 ENCOUNTER — Encounter: Payer: Self-pay | Admitting: Cardiovascular Disease

## 2014-06-01 ENCOUNTER — Ambulatory Visit (INDEPENDENT_AMBULATORY_CARE_PROVIDER_SITE_OTHER): Payer: Medicare Other | Admitting: Cardiovascular Disease

## 2014-06-01 VITALS — BP 120/67 | HR 61 | Ht 71.0 in | Wt 193.0 lb

## 2014-06-01 DIAGNOSIS — G629 Polyneuropathy, unspecified: Secondary | ICD-10-CM | POA: Diagnosis not present

## 2014-06-01 DIAGNOSIS — I739 Peripheral vascular disease, unspecified: Secondary | ICD-10-CM | POA: Diagnosis not present

## 2014-06-01 DIAGNOSIS — I2583 Coronary atherosclerosis due to lipid rich plaque: Secondary | ICD-10-CM

## 2014-06-01 DIAGNOSIS — I200 Unstable angina: Secondary | ICD-10-CM

## 2014-06-01 DIAGNOSIS — I2510 Atherosclerotic heart disease of native coronary artery without angina pectoris: Secondary | ICD-10-CM

## 2014-06-01 DIAGNOSIS — I10 Essential (primary) hypertension: Secondary | ICD-10-CM | POA: Diagnosis not present

## 2014-06-01 DIAGNOSIS — R296 Repeated falls: Secondary | ICD-10-CM | POA: Diagnosis not present

## 2014-06-01 DIAGNOSIS — M150 Primary generalized (osteo)arthritis: Secondary | ICD-10-CM | POA: Diagnosis not present

## 2014-06-01 NOTE — Patient Instructions (Signed)
Dr. Croitoru recommends that you schedule a follow-up appointment in: 6 months    

## 2014-06-01 NOTE — Progress Notes (Signed)
Patient ID: [name] [name], male   DOB: February 15, 1925, 81 y.o.   MRN: [id number]     Cardiology Office Note   Date:  06/01/2014   ID:  [name] [name], DOB 1925-06-03, MRN [id number]  PCP:  [name], MD  Cardiologist:   [name], MD   Chief Complaint  Patient presents with  . Follow-up    2 WEEK:  No complaints of chest pain, edema or dizziness.  Occas. SOB with activity      History of Present Illness: [name] [name] is a 81 y.o. male who presents for follow-up for crescendo angina pectoris in the setting of known coronary artery disease. After his percutaneous intervention, he has had almost complete relief of angina. He no longer wakes up at night with angina pectoris. He has not needed to take any sublingual nitroglycerin.  His recent left heart catheterization performed on April 26 normal left ventricle systolic function despite mild distal anterolateral hypokinesis. There was again confirmation of total occlusion of the proximal LAD with distal filling via left to left and right-to-left collaterals. Mild to moderate lesions were seen in the circumflex and right coronary artery system but there was also a new 95% stenosis in the distal right coronary artery just proximal to the ostium of the PDA with jeopardy to the LAD collaterals. He received a drug-eluting stent (3.518 mm Xience Alpine) to the right coronary artery. He has not had any bleeding problems.  He is quite sedentary due to severe foot neuropathy and is essentially nonambulatory. 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   Past Medical History  Diagnosis Date  . Hypertension   . Osteoporosis   . GERD (gastroesophageal reflux disease)   . Neuropathy   . Skin cancer of nose   . Coronary artery disease   . High cholesterol   . Anginal pain   . Arthritis     "all over"  . Hyperthyroidism   . Depression ~ 20; ~ 1990    Past Surgical History    Procedure Laterality Date  . Cardiac catheterization  09/2007  . Coronary angioplasty with stent placement  05/08/2014    "?1"  . Back surgery    . Lumbar laminectomy/decompression microdiscectomy  2011  . Left heart catheterization with coronary angiogram N/A 05/08/2014    Procedure: LEFT HEART CATHETERIZATION WITH CORONARY ANGIOGRAM;  Surgeon: [name], MD; LAD 100% (old), CFX 20%, OM1 30%, pRCA 30%, dRCA 95%, EF 55%     . Cardiac catheterization N/A 05/08/2014    Procedure: CORONARY STENT INTERVENTION;  Surgeon: [name], MD; 3.518 mm Xience Alpine DES to the RCA      Current Outpatient Prescriptions  Medication Sig Dispense Refill  . acetaminophen (TYLENOL) 500 MG tablet Take 1,000 mg by mouth every 6 (six) hours as needed (pain).    [name] amLODipine (NORVASC) 10 MG tablet Take 5 mg by mouth daily. Take 1/2 tablet For HTN    . aspirin 81 MG tablet Take 81 mg by mouth daily.    [name] atorvastatin (LIPITOR) 10 MG tablet Take 10 mg by mouth daily.    . calcium-vitamin D (OSCAL WITH D) 500-200 MG-UNIT per tablet Take 2 tablets by mouth daily with breakfast.    . clopidogrel (PLAVIX) 75 MG tablet Take 1 tablet (75 mg total) by mouth daily. 30 tablet 11  . Cyanocobalamin (B-12 COMPLIANCE INJECTION IJ) Inject as directed every 30 (thirty) days.    [name]  finasteride (PROSCAR) 5 MG tablet Take 5 mg by mouth daily.    . fish oil-omega-3 fatty acids 1000 MG capsule Take 2 g by mouth daily.    [name] guaiFENesin-dextromethorphan (ROBITUSSIN DM) 100-10 MG/5ML syrup Take 5 mLs by mouth every 4 (four) hours as needed for cough.    . hydroxypropyl methylcellulose / hypromellose (ISOPTO TEARS / GONIOVISC) 2.5 % ophthalmic solution Place 1 drop into both eyes as needed for dry eyes.    . isosorbide mononitrate (IMDUR) 60 MG 24 hr tablet Take 1 tablet (60 mg total) by mouth every evening. 30 tablet 11  . levalbuterol (XOPENEX HFA) 45 MCG/ACT inhaler Inhale 2 puffs into the lungs every 6 (six) hours as needed  for wheezing. He needs a spacer device as well 1 Inhaler 12  . lisinopril-hydrochlorothiazide (PRINZIDE,ZESTORETIC) 20-12.5 MG per tablet Take 1 tablet by mouth 2 (two) times daily.    . metoprolol (LOPRESSOR) 100 MG tablet Take 100 mg by mouth 2 (two) times daily.    . pantoprazole (PROTONIX) 40 MG tablet Take 1 tablet (40 mg total) by mouth daily. 30 tablet 11  . tamsulosin (FLOMAX) 0.4 MG CAPS capsule Take 1 capsule (0.4 mg total) by mouth daily after supper. 30 capsule 0  . traMADol (ULTRAM) 50 MG tablet Take 1 tablet (50 mg total) by mouth every 6 (six) hours as needed. (Patient taking differently: Take 50 mg by mouth every 6 (six) hours as needed for moderate pain. ) 25 tablet 0   No current facility-administered medications for this visit.    Allergies:   Cortisone    Social History:  The patient  reports that he quit smoking about 52 years ago. His smoking use included Cigarettes. He has a 20 pack-year smoking history. He has never used smokeless tobacco. He reports that he drinks alcohol. He reports that he does not use illicit drugs.    ROS:  Please see the history of present illness.    Otherwise, review of systems positive for severe neuropathy of bilateral lower extremities, which markedly limits activity.   All other systems are reviewed and negative.    PHYSICAL EXAM: VS:  BP 120/67 mmHg  Pulse 61  Ht 5\' 11"  (1.803 m)  Wt 87.544 kg (193 lb)  BMI 26.93 kg/m2 , BMI Body mass index is 26.93 kg/(m^2).  General: Alert, oriented x3, no distress Head: no evidence of trauma, PERRL, EOMI, no exophtalmos or lid lag, no myxedema, no xanthelasma; normal ears, nose and oropharynx Neck: normal jugular venous pulsations and no hepatojugular reflux; brisk carotid pulses without delay and no carotid bruits Chest: clear to auscultation, no signs of consolidation by percussion or palpation, normal fremitus, symmetrical and full respiratory excursions Cardiovascular: normal position and  quality of the apical impulse, regular rhythm, normal first and second heart sounds, no murmurs, rubs or gallops Abdomen: no tenderness or distention, no masses by palpation, no abnormal pulsatility or arterial bruits, normal bowel sounds, no hepatosplenomegaly Extremities: no clubbing, cyanosis or edema; 2+ radial, ulnar and brachial pulses bilaterally; 2+ right femoral, posterior tibial and dorsalis pedis pulses; 2+ left femoral, posterior tibial and dorsalis pedis pulses; no subclavian or femoral bruits Neurological: grossly nonfocal Psych: euthymic mood, full affect   EKG:  EKG is not ordered today.  Recent Labs: 04/26/2014: ALT 12; Hemoglobin 14.7; Platelets 176; TSH 0.364 05/08/2014: Magnesium 2.0 05/11/2014: BUN 23; Creatinine 1.25; Potassium 3.7; Sodium 138    Lipid Panel    Component Value Date/Time   CHOL 111  05/10/2014 1015   TRIG 60 05/10/2014 1015   HDL 30* 05/10/2014 1015   CHOLHDL 3.7 05/10/2014 1015   VLDL 12 05/10/2014 1015   LDLCALC 69 05/10/2014 1015      Wt Readings from Last 3 Encounters:  06/01/14 87.544 kg (193 lb)  05/14/14 86.274 kg (190 lb 3.2 oz)  05/10/14 86.8 kg (191 lb 5.8 oz)     ASSESSMENT AND PLAN:  CAD - Improved symptom control following percutaneous revascularization of the distal right coronary artery in the setting of chronic total occlusion of the LAD artery fed via right-to-left collaterals.  Reviewed the critical importance of mandatory uninterrupted dual antiplatelet therapy for at least 12 months to avoid stent thrombosis. A relatively short and large caliber drug-eluting stent was placed. The risk of restenosis should be quite low.  No changes to his medications. Continue statin, long-acting nitrates, beta blocker, aspirin and Plavix. Hypertension control is satisfactory and requires numerous agents (in addition to above also on amlodipine, lisinopril and hydrochlorothiazide in relatively high doses).   Current medicines are reviewed  at length with the patient today.  The patient does not have concerns regarding medicines.  The following changes have been made:  no change  Labs/ tests ordered today include:  No orders of the defined types were placed in this encounter.    Patient Instructions  Dr. [name] recommends that you schedule a follow-up appointment in: 6 months.      Mikael Spray, MD  06/01/2014 1:53 PM    [name], MD, [facility] [phone] office [phone] pager

## 2014-06-04 DIAGNOSIS — I10 Essential (primary) hypertension: Secondary | ICD-10-CM | POA: Diagnosis not present

## 2014-06-04 DIAGNOSIS — R296 Repeated falls: Secondary | ICD-10-CM | POA: Diagnosis not present

## 2014-06-04 DIAGNOSIS — I2510 Atherosclerotic heart disease of native coronary artery without angina pectoris: Secondary | ICD-10-CM | POA: Diagnosis not present

## 2014-06-04 DIAGNOSIS — I739 Peripheral vascular disease, unspecified: Secondary | ICD-10-CM | POA: Diagnosis not present

## 2014-06-04 DIAGNOSIS — M150 Primary generalized (osteo)arthritis: Secondary | ICD-10-CM | POA: Diagnosis not present

## 2014-06-04 DIAGNOSIS — G629 Polyneuropathy, unspecified: Secondary | ICD-10-CM | POA: Diagnosis not present

## 2014-06-05 DIAGNOSIS — I2510 Atherosclerotic heart disease of native coronary artery without angina pectoris: Secondary | ICD-10-CM | POA: Diagnosis not present

## 2014-06-05 DIAGNOSIS — M150 Primary generalized (osteo)arthritis: Secondary | ICD-10-CM | POA: Diagnosis not present

## 2014-06-05 DIAGNOSIS — I10 Essential (primary) hypertension: Secondary | ICD-10-CM | POA: Diagnosis not present

## 2014-06-05 DIAGNOSIS — R296 Repeated falls: Secondary | ICD-10-CM | POA: Diagnosis not present

## 2014-06-05 DIAGNOSIS — G629 Polyneuropathy, unspecified: Secondary | ICD-10-CM | POA: Diagnosis not present

## 2014-06-05 DIAGNOSIS — I739 Peripheral vascular disease, unspecified: Secondary | ICD-10-CM | POA: Diagnosis not present

## 2014-06-06 DIAGNOSIS — M150 Primary generalized (osteo)arthritis: Secondary | ICD-10-CM | POA: Diagnosis not present

## 2014-06-06 DIAGNOSIS — R296 Repeated falls: Secondary | ICD-10-CM | POA: Diagnosis not present

## 2014-06-06 DIAGNOSIS — I10 Essential (primary) hypertension: Secondary | ICD-10-CM | POA: Diagnosis not present

## 2014-06-06 DIAGNOSIS — I2510 Atherosclerotic heart disease of native coronary artery without angina pectoris: Secondary | ICD-10-CM | POA: Diagnosis not present

## 2014-06-06 DIAGNOSIS — I739 Peripheral vascular disease, unspecified: Secondary | ICD-10-CM | POA: Diagnosis not present

## 2014-06-06 DIAGNOSIS — G629 Polyneuropathy, unspecified: Secondary | ICD-10-CM | POA: Diagnosis not present

## 2014-06-08 DIAGNOSIS — I739 Peripheral vascular disease, unspecified: Secondary | ICD-10-CM | POA: Diagnosis not present

## 2014-06-08 DIAGNOSIS — R296 Repeated falls: Secondary | ICD-10-CM | POA: Diagnosis not present

## 2014-06-08 DIAGNOSIS — M150 Primary generalized (osteo)arthritis: Secondary | ICD-10-CM | POA: Diagnosis not present

## 2014-06-08 DIAGNOSIS — I2510 Atherosclerotic heart disease of native coronary artery without angina pectoris: Secondary | ICD-10-CM | POA: Diagnosis not present

## 2014-06-08 DIAGNOSIS — G629 Polyneuropathy, unspecified: Secondary | ICD-10-CM | POA: Diagnosis not present

## 2014-06-08 DIAGNOSIS — I10 Essential (primary) hypertension: Secondary | ICD-10-CM | POA: Diagnosis not present

## 2014-06-12 DIAGNOSIS — R296 Repeated falls: Secondary | ICD-10-CM | POA: Diagnosis not present

## 2014-06-12 DIAGNOSIS — I739 Peripheral vascular disease, unspecified: Secondary | ICD-10-CM | POA: Diagnosis not present

## 2014-06-12 DIAGNOSIS — M150 Primary generalized (osteo)arthritis: Secondary | ICD-10-CM | POA: Diagnosis not present

## 2014-06-12 DIAGNOSIS — I2510 Atherosclerotic heart disease of native coronary artery without angina pectoris: Secondary | ICD-10-CM | POA: Diagnosis not present

## 2014-06-12 DIAGNOSIS — I10 Essential (primary) hypertension: Secondary | ICD-10-CM | POA: Diagnosis not present

## 2014-06-12 DIAGNOSIS — G629 Polyneuropathy, unspecified: Secondary | ICD-10-CM | POA: Diagnosis not present

## 2014-06-12 DIAGNOSIS — R339 Retention of urine, unspecified: Secondary | ICD-10-CM | POA: Diagnosis not present

## 2014-06-12 NOTE — Progress Notes (Signed)
OT Note - Addendum    05/10/14 1402  OT G-codes **NOT FOR INPATIENT CLASS**  Functional Assessment Tool Used clinical judgement  Functional Limitation Self care  Self Care Current Status [id number]) CK  Self Care Goal Status [id number]) CI  [facility], OTR/L  [phone] 05/10/2014

## 2014-06-13 DIAGNOSIS — R296 Repeated falls: Secondary | ICD-10-CM | POA: Diagnosis not present

## 2014-06-13 DIAGNOSIS — M150 Primary generalized (osteo)arthritis: Secondary | ICD-10-CM | POA: Diagnosis not present

## 2014-06-13 DIAGNOSIS — I2510 Atherosclerotic heart disease of native coronary artery without angina pectoris: Secondary | ICD-10-CM | POA: Diagnosis not present

## 2014-06-13 DIAGNOSIS — G629 Polyneuropathy, unspecified: Secondary | ICD-10-CM | POA: Diagnosis not present

## 2014-06-13 DIAGNOSIS — I10 Essential (primary) hypertension: Secondary | ICD-10-CM | POA: Diagnosis not present

## 2014-06-13 DIAGNOSIS — I739 Peripheral vascular disease, unspecified: Secondary | ICD-10-CM | POA: Diagnosis not present

## 2014-06-15 DIAGNOSIS — I10 Essential (primary) hypertension: Secondary | ICD-10-CM | POA: Diagnosis not present

## 2014-06-15 DIAGNOSIS — M6281 Muscle weakness (generalized): Secondary | ICD-10-CM | POA: Diagnosis not present

## 2014-06-15 DIAGNOSIS — G629 Polyneuropathy, unspecified: Secondary | ICD-10-CM | POA: Diagnosis not present

## 2014-06-15 DIAGNOSIS — I739 Peripheral vascular disease, unspecified: Secondary | ICD-10-CM | POA: Diagnosis not present

## 2014-06-15 DIAGNOSIS — I2510 Atherosclerotic heart disease of native coronary artery without angina pectoris: Secondary | ICD-10-CM | POA: Diagnosis not present

## 2014-06-15 DIAGNOSIS — I200 Unstable angina: Secondary | ICD-10-CM | POA: Diagnosis not present

## 2014-06-18 DIAGNOSIS — I10 Essential (primary) hypertension: Secondary | ICD-10-CM | POA: Diagnosis not present

## 2014-06-18 DIAGNOSIS — M6281 Muscle weakness (generalized): Secondary | ICD-10-CM | POA: Diagnosis not present

## 2014-06-18 DIAGNOSIS — I2510 Atherosclerotic heart disease of native coronary artery without angina pectoris: Secondary | ICD-10-CM | POA: Diagnosis not present

## 2014-06-18 DIAGNOSIS — G629 Polyneuropathy, unspecified: Secondary | ICD-10-CM | POA: Diagnosis not present

## 2014-06-18 DIAGNOSIS — I739 Peripheral vascular disease, unspecified: Secondary | ICD-10-CM | POA: Diagnosis not present

## 2014-06-18 DIAGNOSIS — I200 Unstable angina: Secondary | ICD-10-CM | POA: Diagnosis not present

## 2014-06-19 DIAGNOSIS — I10 Essential (primary) hypertension: Secondary | ICD-10-CM | POA: Diagnosis not present

## 2014-06-19 DIAGNOSIS — I2510 Atherosclerotic heart disease of native coronary artery without angina pectoris: Secondary | ICD-10-CM | POA: Diagnosis not present

## 2014-06-19 DIAGNOSIS — G629 Polyneuropathy, unspecified: Secondary | ICD-10-CM | POA: Diagnosis not present

## 2014-06-19 DIAGNOSIS — M6281 Muscle weakness (generalized): Secondary | ICD-10-CM | POA: Diagnosis not present

## 2014-06-19 DIAGNOSIS — I200 Unstable angina: Secondary | ICD-10-CM | POA: Diagnosis not present

## 2014-06-19 DIAGNOSIS — I739 Peripheral vascular disease, unspecified: Secondary | ICD-10-CM | POA: Diagnosis not present

## 2014-06-20 DIAGNOSIS — I2510 Atherosclerotic heart disease of native coronary artery without angina pectoris: Secondary | ICD-10-CM | POA: Diagnosis not present

## 2014-06-20 DIAGNOSIS — M6281 Muscle weakness (generalized): Secondary | ICD-10-CM | POA: Diagnosis not present

## 2014-06-20 DIAGNOSIS — I200 Unstable angina: Secondary | ICD-10-CM | POA: Diagnosis not present

## 2014-06-20 DIAGNOSIS — I739 Peripheral vascular disease, unspecified: Secondary | ICD-10-CM | POA: Diagnosis not present

## 2014-06-20 DIAGNOSIS — I10 Essential (primary) hypertension: Secondary | ICD-10-CM | POA: Diagnosis not present

## 2014-06-20 DIAGNOSIS — G629 Polyneuropathy, unspecified: Secondary | ICD-10-CM | POA: Diagnosis not present

## 2014-06-22 DIAGNOSIS — I200 Unstable angina: Secondary | ICD-10-CM | POA: Diagnosis not present

## 2014-06-22 DIAGNOSIS — I739 Peripheral vascular disease, unspecified: Secondary | ICD-10-CM | POA: Diagnosis not present

## 2014-06-22 DIAGNOSIS — G629 Polyneuropathy, unspecified: Secondary | ICD-10-CM | POA: Diagnosis not present

## 2014-06-22 DIAGNOSIS — I10 Essential (primary) hypertension: Secondary | ICD-10-CM | POA: Diagnosis not present

## 2014-06-22 DIAGNOSIS — M6281 Muscle weakness (generalized): Secondary | ICD-10-CM | POA: Diagnosis not present

## 2014-06-22 DIAGNOSIS — I2510 Atherosclerotic heart disease of native coronary artery without angina pectoris: Secondary | ICD-10-CM | POA: Diagnosis not present

## 2014-06-25 DIAGNOSIS — I739 Peripheral vascular disease, unspecified: Secondary | ICD-10-CM | POA: Diagnosis not present

## 2014-06-25 DIAGNOSIS — M6281 Muscle weakness (generalized): Secondary | ICD-10-CM | POA: Diagnosis not present

## 2014-06-25 DIAGNOSIS — I2510 Atherosclerotic heart disease of native coronary artery without angina pectoris: Secondary | ICD-10-CM | POA: Diagnosis not present

## 2014-06-25 DIAGNOSIS — I10 Essential (primary) hypertension: Secondary | ICD-10-CM | POA: Diagnosis not present

## 2014-06-25 DIAGNOSIS — G629 Polyneuropathy, unspecified: Secondary | ICD-10-CM | POA: Diagnosis not present

## 2014-06-25 DIAGNOSIS — I200 Unstable angina: Secondary | ICD-10-CM | POA: Diagnosis not present

## 2014-06-26 DIAGNOSIS — M6281 Muscle weakness (generalized): Secondary | ICD-10-CM | POA: Diagnosis not present

## 2014-06-26 DIAGNOSIS — G629 Polyneuropathy, unspecified: Secondary | ICD-10-CM | POA: Diagnosis not present

## 2014-06-26 DIAGNOSIS — I200 Unstable angina: Secondary | ICD-10-CM | POA: Diagnosis not present

## 2014-06-26 DIAGNOSIS — I10 Essential (primary) hypertension: Secondary | ICD-10-CM | POA: Diagnosis not present

## 2014-06-26 DIAGNOSIS — I739 Peripheral vascular disease, unspecified: Secondary | ICD-10-CM | POA: Diagnosis not present

## 2014-06-26 DIAGNOSIS — I2510 Atherosclerotic heart disease of native coronary artery without angina pectoris: Secondary | ICD-10-CM | POA: Diagnosis not present

## 2014-06-27 DIAGNOSIS — I200 Unstable angina: Secondary | ICD-10-CM | POA: Diagnosis not present

## 2014-06-27 DIAGNOSIS — G629 Polyneuropathy, unspecified: Secondary | ICD-10-CM | POA: Diagnosis not present

## 2014-06-27 DIAGNOSIS — I739 Peripheral vascular disease, unspecified: Secondary | ICD-10-CM | POA: Diagnosis not present

## 2014-06-27 DIAGNOSIS — M6281 Muscle weakness (generalized): Secondary | ICD-10-CM | POA: Diagnosis not present

## 2014-06-27 DIAGNOSIS — I2510 Atherosclerotic heart disease of native coronary artery without angina pectoris: Secondary | ICD-10-CM | POA: Diagnosis not present

## 2014-06-27 DIAGNOSIS — I10 Essential (primary) hypertension: Secondary | ICD-10-CM | POA: Diagnosis not present

## 2014-06-29 DIAGNOSIS — I739 Peripheral vascular disease, unspecified: Secondary | ICD-10-CM | POA: Diagnosis not present

## 2014-06-29 DIAGNOSIS — I2510 Atherosclerotic heart disease of native coronary artery without angina pectoris: Secondary | ICD-10-CM | POA: Diagnosis not present

## 2014-06-29 DIAGNOSIS — I200 Unstable angina: Secondary | ICD-10-CM | POA: Diagnosis not present

## 2014-06-29 DIAGNOSIS — I10 Essential (primary) hypertension: Secondary | ICD-10-CM | POA: Diagnosis not present

## 2014-06-29 DIAGNOSIS — M6281 Muscle weakness (generalized): Secondary | ICD-10-CM | POA: Diagnosis not present

## 2014-06-29 DIAGNOSIS — G629 Polyneuropathy, unspecified: Secondary | ICD-10-CM | POA: Diagnosis not present

## 2014-07-02 DIAGNOSIS — M6281 Muscle weakness (generalized): Secondary | ICD-10-CM | POA: Diagnosis not present

## 2014-07-02 DIAGNOSIS — I739 Peripheral vascular disease, unspecified: Secondary | ICD-10-CM | POA: Diagnosis not present

## 2014-07-02 DIAGNOSIS — G629 Polyneuropathy, unspecified: Secondary | ICD-10-CM | POA: Diagnosis not present

## 2014-07-02 DIAGNOSIS — I200 Unstable angina: Secondary | ICD-10-CM | POA: Diagnosis not present

## 2014-07-02 DIAGNOSIS — I2510 Atherosclerotic heart disease of native coronary artery without angina pectoris: Secondary | ICD-10-CM | POA: Diagnosis not present

## 2014-07-02 DIAGNOSIS — I10 Essential (primary) hypertension: Secondary | ICD-10-CM | POA: Diagnosis not present

## 2014-07-03 DIAGNOSIS — I10 Essential (primary) hypertension: Secondary | ICD-10-CM | POA: Diagnosis not present

## 2014-07-03 DIAGNOSIS — I2510 Atherosclerotic heart disease of native coronary artery without angina pectoris: Secondary | ICD-10-CM | POA: Diagnosis not present

## 2014-07-03 DIAGNOSIS — I200 Unstable angina: Secondary | ICD-10-CM | POA: Diagnosis not present

## 2014-07-03 DIAGNOSIS — G629 Polyneuropathy, unspecified: Secondary | ICD-10-CM | POA: Diagnosis not present

## 2014-07-03 DIAGNOSIS — M6281 Muscle weakness (generalized): Secondary | ICD-10-CM | POA: Diagnosis not present

## 2014-07-03 DIAGNOSIS — I739 Peripheral vascular disease, unspecified: Secondary | ICD-10-CM | POA: Diagnosis not present

## 2014-07-04 DIAGNOSIS — M6281 Muscle weakness (generalized): Secondary | ICD-10-CM | POA: Diagnosis not present

## 2014-07-04 DIAGNOSIS — I2510 Atherosclerotic heart disease of native coronary artery without angina pectoris: Secondary | ICD-10-CM | POA: Diagnosis not present

## 2014-07-04 DIAGNOSIS — I200 Unstable angina: Secondary | ICD-10-CM | POA: Diagnosis not present

## 2014-07-04 DIAGNOSIS — I10 Essential (primary) hypertension: Secondary | ICD-10-CM | POA: Diagnosis not present

## 2014-07-04 DIAGNOSIS — G629 Polyneuropathy, unspecified: Secondary | ICD-10-CM | POA: Diagnosis not present

## 2014-07-04 DIAGNOSIS — I739 Peripheral vascular disease, unspecified: Secondary | ICD-10-CM | POA: Diagnosis not present

## 2014-07-04 NOTE — Progress Notes (Signed)
OT Note - Addendum    05/10/14 1405  OT Visit Information  Last OT Received On 07/31/14  OT G-codes **NOT FOR INPATIENT CLASS**  Functional Assessment Tool Used clinical judgement  Functional Limitation Self care  Self Care Discharge Status [phone]) [place], OTR/L  [phone] 05/10/2014

## 2014-07-06 DIAGNOSIS — I2510 Atherosclerotic heart disease of native coronary artery without angina pectoris: Secondary | ICD-10-CM | POA: Diagnosis not present

## 2014-07-06 DIAGNOSIS — I200 Unstable angina: Secondary | ICD-10-CM | POA: Diagnosis not present

## 2014-07-06 DIAGNOSIS — M6281 Muscle weakness (generalized): Secondary | ICD-10-CM | POA: Diagnosis not present

## 2014-07-06 DIAGNOSIS — I10 Essential (primary) hypertension: Secondary | ICD-10-CM | POA: Diagnosis not present

## 2014-07-06 DIAGNOSIS — I739 Peripheral vascular disease, unspecified: Secondary | ICD-10-CM | POA: Diagnosis not present

## 2014-07-06 DIAGNOSIS — G629 Polyneuropathy, unspecified: Secondary | ICD-10-CM | POA: Diagnosis not present

## 2014-07-09 DIAGNOSIS — I2510 Atherosclerotic heart disease of native coronary artery without angina pectoris: Secondary | ICD-10-CM | POA: Diagnosis not present

## 2014-07-09 DIAGNOSIS — M6281 Muscle weakness (generalized): Secondary | ICD-10-CM | POA: Diagnosis not present

## 2014-07-09 DIAGNOSIS — I200 Unstable angina: Secondary | ICD-10-CM | POA: Diagnosis not present

## 2014-07-09 DIAGNOSIS — I10 Essential (primary) hypertension: Secondary | ICD-10-CM | POA: Diagnosis not present

## 2014-07-09 DIAGNOSIS — G629 Polyneuropathy, unspecified: Secondary | ICD-10-CM | POA: Diagnosis not present

## 2014-07-09 DIAGNOSIS — R269 Unspecified abnormalities of gait and mobility: Secondary | ICD-10-CM | POA: Diagnosis not present

## 2014-07-09 DIAGNOSIS — I739 Peripheral vascular disease, unspecified: Secondary | ICD-10-CM | POA: Diagnosis not present

## 2014-07-10 DIAGNOSIS — I10 Essential (primary) hypertension: Secondary | ICD-10-CM | POA: Diagnosis not present

## 2014-07-10 DIAGNOSIS — G629 Polyneuropathy, unspecified: Secondary | ICD-10-CM | POA: Diagnosis not present

## 2014-07-10 DIAGNOSIS — I200 Unstable angina: Secondary | ICD-10-CM | POA: Diagnosis not present

## 2014-07-10 DIAGNOSIS — I2510 Atherosclerotic heart disease of native coronary artery without angina pectoris: Secondary | ICD-10-CM | POA: Diagnosis not present

## 2014-07-10 DIAGNOSIS — I739 Peripheral vascular disease, unspecified: Secondary | ICD-10-CM | POA: Diagnosis not present

## 2014-07-10 DIAGNOSIS — M6281 Muscle weakness (generalized): Secondary | ICD-10-CM | POA: Diagnosis not present

## 2014-07-13 DIAGNOSIS — I10 Essential (primary) hypertension: Secondary | ICD-10-CM | POA: Diagnosis not present

## 2014-07-13 DIAGNOSIS — G629 Polyneuropathy, unspecified: Secondary | ICD-10-CM | POA: Diagnosis not present

## 2014-07-13 DIAGNOSIS — I200 Unstable angina: Secondary | ICD-10-CM | POA: Diagnosis not present

## 2014-07-13 DIAGNOSIS — I739 Peripheral vascular disease, unspecified: Secondary | ICD-10-CM | POA: Diagnosis not present

## 2014-07-13 DIAGNOSIS — M6281 Muscle weakness (generalized): Secondary | ICD-10-CM | POA: Diagnosis not present

## 2014-07-13 DIAGNOSIS — I2510 Atherosclerotic heart disease of native coronary artery without angina pectoris: Secondary | ICD-10-CM | POA: Diagnosis not present

## 2014-07-17 ENCOUNTER — Ambulatory Visit (INDEPENDENT_AMBULATORY_CARE_PROVIDER_SITE_OTHER): Payer: Medicare Other | Admitting: Internal Medicine

## 2014-07-17 VITALS — BP 140/90 | HR 58 | Temp 97.6°F | Resp 18 | Ht 71.0 in | Wt 190.0 lb

## 2014-07-17 DIAGNOSIS — S51801A Unspecified open wound of right forearm, initial encounter: Secondary | ICD-10-CM

## 2014-07-17 DIAGNOSIS — I200 Unstable angina: Secondary | ICD-10-CM

## 2014-07-17 NOTE — Progress Notes (Addendum)
Subjective:  This chart was scribed for [name], MD by Leandra Kern, Medical Scribe. This patient was seen in Room 7 and the patient's care was started at 1:14 PM.   Patient ID: [name] [name], male    DOB: 23-Apr-1925, 81 y.o.   MRN: [id number]  HPI HPI Comments: [name] [name] is a 81 y.o. male who presents to [facility] complaining of a right arm laceration, onset this morning. Pt notes that while he was sitting on his wheelchair, he was bending down, the wheelchair sled from under him and cut his arm. Bleeding is controlled.   fam recently found out that his youngest daughter has  Breast cancer but he doesn't know.  Active Ambulatory Problems    Diagnosis Date Noted  . Dehydration 04/05/2013  . Hypertension 04/05/2013  . Arthritis 04/05/2013  . Peripheral neuropathy 04/05/2013  . Gastroenteritis, acute 04/05/2013  . Gastroenteritis 04/05/2013  . GERD (gastroesophageal reflux disease) 04/06/2013  . Bladder outflow obstruction 04/06/2013  . Dysphagia, pharyngoesophageal phase 04/10/2013  . Frequent falls 04/10/2013  . Weakness 04/10/2013  . Hyperthyroidism, subclinical 04/10/2013  . BPH (benign prostatic hypertrophy) 05/31/2013  . CAD (coronary artery disease) 05/31/2013  . UTI (urinary tract infection) 05/31/2013  . Hyperlipidemia 05/31/2013  . Contusion of left hip 07/04/2013  . Fall 07/04/2013  . Angina pectoris, crescendo 04/28/2014  . Coronary artery disease due to lipid rich plaque   . Unstable angina 05/10/2014   Resolved Ambulatory Problems    Diagnosis Date Noted  . No Resolved Ambulatory Problems   Past Medical History  Diagnosis Date  . Osteoporosis   . Neuropathy   . Skin cancer of nose   . Coronary artery disease   . High cholesterol   . Anginal pain   . Hyperthyroidism   . Depression ~ 1959; ~ 1990   Current Outpatient Prescriptions on File Prior to Visit  Medication Sig Dispense Refill  . amLODipine (NORVASC) 10 MG tablet  Take 5 mg by mouth daily. Take 1/2 tablet For HTN    . aspirin 81 MG tablet Take 81 mg by mouth daily.    [name] atorvastatin (LIPITOR) 10 MG tablet Take 10 mg by mouth daily.    . calcium-vitamin D (OSCAL WITH D) 500-200 MG-UNIT per tablet Take 2 tablets by mouth daily with breakfast.    . clopidogrel (PLAVIX) 75 MG tablet Take 1 tablet (75 mg total) by mouth daily. 30 tablet 11  . Cyanocobalamin (B-12 COMPLIANCE INJECTION IJ) Inject as directed every 30 (thirty) days.    . finasteride (PROSCAR) 5 MG tablet Take 5 mg by mouth daily.    . fish oil-omega-3 fatty acids 1000 MG capsule Take 2 g by mouth daily.    . isosorbide mononitrate (IMDUR) 60 MG 24 hr tablet Take 1 tablet (60 mg total) by mouth every evening. 30 tablet 11  . lisinopril-hydrochlorothiazide (PRINZIDE,ZESTORETIC) 20-12.5 MG per tablet Take 1 tablet by mouth 2 (two) times daily.    . metoprolol (LOPRESSOR) 100 MG tablet Take 100 mg by mouth 2 (two) times daily.    . pantoprazole (PROTONIX) 40 MG tablet Take 1 tablet (40 mg total) by mouth daily. 30 tablet 11  . tamsulosin (FLOMAX) 0.4 MG CAPS capsule Take 1 capsule (0.4 mg total) by mouth daily after supper. 30 capsule 0  . acetaminophen (TYLENOL) 500 MG tablet Take 1,000 mg by mouth every 6 (six) hours as needed (pain).    [name] guaiFENesin-dextromethorphan (ROBITUSSIN DM) 100-10 MG/5ML  syrup Take 5 mLs by mouth every 4 (four) hours as needed for cough.    . hydroxypropyl methylcellulose / hypromellose (ISOPTO TEARS / GONIOVISC) 2.5 % ophthalmic solution Place 1 drop into both eyes as needed for dry eyes.    [name] levalbuterol (XOPENEX HFA) 45 MCG/ACT inhaler Inhale 2 puffs into the lungs every 6 (six) hours as needed for wheezing. He needs a spacer device as well (Patient not taking: Reported on 07/17/2014) 1 Inhaler 12  . traMADol (ULTRAM) 50 MG tablet Take 1 tablet (50 mg total) by mouth every 6 (six) hours as needed. (Patient not taking: Reported on 07/17/2014) 25 tablet 0   No current  facility-administered medications on file prior to visit.     Allergies  Allergen Reactions  . Cortisone     Causes emotional problems      Review of Systems  Skin: Positive for wound (laceration ).      Objective:   Physical Exam  Constitutional: He is oriented to person, place, and time. He appears well-developed and well-nourished. No distress.  HENT:  Head: Normocephalic and atraumatic.  Eyes: EOM are normal. Pupils are equal, round, and reactive to light.  Neck: Neck supple.  Cardiovascular: Normal rate.   Pulmonary/Chest: Effort normal.  Musculoskeletal:  Laceration dorsal aspect of right forearm that is part avulsion in part laceration with devitalized tissue at the lower edge of the wound--5 cm in length No deep tissues exposed There is no loss of muscle function or nerve function distal to the wound  Neurological: He is alert and oriented to person, place, and time. No cranial nerve deficit.  Skin: Skin is warm and dry.  Psychiatric: He has a normal mood and affect. His behavior is normal.  Nursing note and vitals reviewed.     Assessment & Plan:  I have completed the patient encounter in its entirety as documented by the scribe, with editing by me where necessary. Andrina Locken P. Laney Pastor, M.D.  Wound, open, arm, forearm, right, initial encounter  Wound care Follow-up suture removal 7-14 days

## 2014-07-17 NOTE — Patient Instructions (Signed)
WOUND CARE Please return in 7 days to have your stitches/staples removed or sooner if you have concerns. [name] Keep area clean and dry with dressing in place for 24 hours. . After 24 hours, remove bandage and wash wound gently with mild soap and warm water. When skin is dry, reapply a new bandage. Once wound is no longer draining, may leave wound open to air. . Continue daily cleansing with soap and water until stitches/staples are removed. . Do not apply any ointments or creams to the wound while stitches/staples are in place, as this may cause delayed healing. . Notify the office if you experience any of the following signs of infection: Swelling, redness, pus drainage, streaking, fever >101.0 F . Notify the office if you experience excessive bleeding that does not stop after 15-20 minutes of constant, firm pressure.

## 2014-07-17 NOTE — Progress Notes (Signed)
Consent obtained. 1% 4cc lido local anesthesia. Cleaned with soap and water. Sterile draping placed. #11 5-0 ethilon simple interrupted sutures placed. Clean dressing placed. Care instructions given.

## 2014-07-18 DIAGNOSIS — G629 Polyneuropathy, unspecified: Secondary | ICD-10-CM | POA: Diagnosis not present

## 2014-07-18 DIAGNOSIS — M6281 Muscle weakness (generalized): Secondary | ICD-10-CM | POA: Diagnosis not present

## 2014-07-18 DIAGNOSIS — I739 Peripheral vascular disease, unspecified: Secondary | ICD-10-CM | POA: Diagnosis not present

## 2014-07-18 DIAGNOSIS — I200 Unstable angina: Secondary | ICD-10-CM | POA: Diagnosis not present

## 2014-07-18 DIAGNOSIS — I2510 Atherosclerotic heart disease of native coronary artery without angina pectoris: Secondary | ICD-10-CM | POA: Diagnosis not present

## 2014-07-18 DIAGNOSIS — I10 Essential (primary) hypertension: Secondary | ICD-10-CM | POA: Diagnosis not present

## 2014-07-19 DIAGNOSIS — I739 Peripheral vascular disease, unspecified: Secondary | ICD-10-CM | POA: Diagnosis not present

## 2014-07-19 DIAGNOSIS — I10 Essential (primary) hypertension: Secondary | ICD-10-CM | POA: Diagnosis not present

## 2014-07-19 DIAGNOSIS — M6281 Muscle weakness (generalized): Secondary | ICD-10-CM | POA: Diagnosis not present

## 2014-07-19 DIAGNOSIS — I2510 Atherosclerotic heart disease of native coronary artery without angina pectoris: Secondary | ICD-10-CM | POA: Diagnosis not present

## 2014-07-19 DIAGNOSIS — I200 Unstable angina: Secondary | ICD-10-CM | POA: Diagnosis not present

## 2014-07-19 DIAGNOSIS — G629 Polyneuropathy, unspecified: Secondary | ICD-10-CM | POA: Diagnosis not present

## 2014-07-20 DIAGNOSIS — I739 Peripheral vascular disease, unspecified: Secondary | ICD-10-CM | POA: Diagnosis not present

## 2014-07-20 DIAGNOSIS — I10 Essential (primary) hypertension: Secondary | ICD-10-CM | POA: Diagnosis not present

## 2014-07-20 DIAGNOSIS — M6281 Muscle weakness (generalized): Secondary | ICD-10-CM | POA: Diagnosis not present

## 2014-07-20 DIAGNOSIS — I200 Unstable angina: Secondary | ICD-10-CM | POA: Diagnosis not present

## 2014-07-20 DIAGNOSIS — G629 Polyneuropathy, unspecified: Secondary | ICD-10-CM | POA: Diagnosis not present

## 2014-07-20 DIAGNOSIS — I2510 Atherosclerotic heart disease of native coronary artery without angina pectoris: Secondary | ICD-10-CM | POA: Diagnosis not present

## 2014-07-23 DIAGNOSIS — G629 Polyneuropathy, unspecified: Secondary | ICD-10-CM | POA: Diagnosis not present

## 2014-07-23 DIAGNOSIS — I739 Peripheral vascular disease, unspecified: Secondary | ICD-10-CM | POA: Diagnosis not present

## 2014-07-23 DIAGNOSIS — I10 Essential (primary) hypertension: Secondary | ICD-10-CM | POA: Diagnosis not present

## 2014-07-23 DIAGNOSIS — M6281 Muscle weakness (generalized): Secondary | ICD-10-CM | POA: Diagnosis not present

## 2014-07-23 DIAGNOSIS — I200 Unstable angina: Secondary | ICD-10-CM | POA: Diagnosis not present

## 2014-07-23 DIAGNOSIS — I2510 Atherosclerotic heart disease of native coronary artery without angina pectoris: Secondary | ICD-10-CM | POA: Diagnosis not present

## 2014-07-24 ENCOUNTER — Ambulatory Visit (INDEPENDENT_AMBULATORY_CARE_PROVIDER_SITE_OTHER): Payer: Medicare Other | Admitting: Physician Assistant

## 2014-07-24 VITALS — BP 130/80 | HR 65 | Temp 97.9°F | Resp 18

## 2014-07-24 DIAGNOSIS — S51801D Unspecified open wound of right forearm, subsequent encounter: Secondary | ICD-10-CM

## 2014-07-24 DIAGNOSIS — Z4802 Encounter for removal of sutures: Secondary | ICD-10-CM

## 2014-07-24 NOTE — Progress Notes (Signed)
   Subjective:    Patient ID: [name] [name], male    DOB: 10-Sep-1925, 81 y.o.   MRN: [id number]  HPI Patient presents for suture removal status post laceration repair 7 days ago. States that he has been washing arm with soap and water. Has kept wound covered. Denies drainage, fever, or pain.    Review of Systems  Constitutional: Negative.  Negative for fever.  Skin: Positive for wound. Negative for color change.       Objective:   Physical Exam  Constitutional: He is oriented to person, place, and time. He appears well-developed and well-nourished. No distress.  Blood pressure 130/80, pulse 65, temperature 97.9 F (36.6 C), temperature source Oral, resp. rate 18, SpO2 96 %.   HENT:  Head: Normocephalic and atraumatic.  Right Ear: External ear normal.  Left Ear: External ear normal.  Eyes: Conjunctivae are normal. Right eye exhibits no discharge. Left eye exhibits no discharge. No scleral icterus.  Pulmonary/Chest: Effort normal.  Neurological: He is alert and oriented to person, place, and time.  Skin: Skin is warm and dry. No rash noted. He is not diaphoretic. There is erythema (marginal around laceration border. ). No pallor.  #11 sutures intact  Psychiatric: He has a normal mood and affect. His behavior is normal. Judgment and thought content normal.       Assessment & Plan:  1. Wound, open, arm, forearm, right, subsequent encounter 2. Encounter for removal of sutures Wound healed well. #11 sutures removed.    Alveta Heimlich PA-C  [facility] 07/24/2014 4:08 PM

## 2014-07-25 DIAGNOSIS — G629 Polyneuropathy, unspecified: Secondary | ICD-10-CM | POA: Diagnosis not present

## 2014-07-25 DIAGNOSIS — I200 Unstable angina: Secondary | ICD-10-CM | POA: Diagnosis not present

## 2014-07-25 DIAGNOSIS — I2510 Atherosclerotic heart disease of native coronary artery without angina pectoris: Secondary | ICD-10-CM | POA: Diagnosis not present

## 2014-07-25 DIAGNOSIS — I10 Essential (primary) hypertension: Secondary | ICD-10-CM | POA: Diagnosis not present

## 2014-07-25 DIAGNOSIS — I739 Peripheral vascular disease, unspecified: Secondary | ICD-10-CM | POA: Diagnosis not present

## 2014-07-25 DIAGNOSIS — M6281 Muscle weakness (generalized): Secondary | ICD-10-CM | POA: Diagnosis not present

## 2014-07-27 DIAGNOSIS — M6281 Muscle weakness (generalized): Secondary | ICD-10-CM | POA: Diagnosis not present

## 2014-07-27 DIAGNOSIS — G629 Polyneuropathy, unspecified: Secondary | ICD-10-CM | POA: Diagnosis not present

## 2014-07-27 DIAGNOSIS — I10 Essential (primary) hypertension: Secondary | ICD-10-CM | POA: Diagnosis not present

## 2014-07-27 DIAGNOSIS — I200 Unstable angina: Secondary | ICD-10-CM | POA: Diagnosis not present

## 2014-07-27 DIAGNOSIS — I2510 Atherosclerotic heart disease of native coronary artery without angina pectoris: Secondary | ICD-10-CM | POA: Diagnosis not present

## 2014-07-27 DIAGNOSIS — I739 Peripheral vascular disease, unspecified: Secondary | ICD-10-CM | POA: Diagnosis not present

## 2014-07-31 DIAGNOSIS — M6281 Muscle weakness (generalized): Secondary | ICD-10-CM | POA: Diagnosis not present

## 2014-07-31 DIAGNOSIS — I10 Essential (primary) hypertension: Secondary | ICD-10-CM | POA: Diagnosis not present

## 2014-07-31 DIAGNOSIS — I200 Unstable angina: Secondary | ICD-10-CM | POA: Diagnosis not present

## 2014-07-31 DIAGNOSIS — I2510 Atherosclerotic heart disease of native coronary artery without angina pectoris: Secondary | ICD-10-CM | POA: Diagnosis not present

## 2014-07-31 DIAGNOSIS — G629 Polyneuropathy, unspecified: Secondary | ICD-10-CM | POA: Diagnosis not present

## 2014-07-31 DIAGNOSIS — I739 Peripheral vascular disease, unspecified: Secondary | ICD-10-CM | POA: Diagnosis not present

## 2014-07-31 NOTE — Progress Notes (Signed)
  Medical screening examination/treatment/procedure(s) were performed by non-physician practitioner and as supervising physician I was immediately available for consultation/collaboration.     

## 2014-08-01 DIAGNOSIS — I2510 Atherosclerotic heart disease of native coronary artery without angina pectoris: Secondary | ICD-10-CM | POA: Diagnosis not present

## 2014-08-01 DIAGNOSIS — I10 Essential (primary) hypertension: Secondary | ICD-10-CM | POA: Diagnosis not present

## 2014-08-01 DIAGNOSIS — I739 Peripheral vascular disease, unspecified: Secondary | ICD-10-CM | POA: Diagnosis not present

## 2014-08-01 DIAGNOSIS — M6281 Muscle weakness (generalized): Secondary | ICD-10-CM | POA: Diagnosis not present

## 2014-08-01 DIAGNOSIS — G629 Polyneuropathy, unspecified: Secondary | ICD-10-CM | POA: Diagnosis not present

## 2014-08-01 DIAGNOSIS — I200 Unstable angina: Secondary | ICD-10-CM | POA: Diagnosis not present

## 2014-08-03 DIAGNOSIS — G629 Polyneuropathy, unspecified: Secondary | ICD-10-CM | POA: Diagnosis not present

## 2014-08-03 DIAGNOSIS — I200 Unstable angina: Secondary | ICD-10-CM | POA: Diagnosis not present

## 2014-08-03 DIAGNOSIS — I739 Peripheral vascular disease, unspecified: Secondary | ICD-10-CM | POA: Diagnosis not present

## 2014-08-03 DIAGNOSIS — I2510 Atherosclerotic heart disease of native coronary artery without angina pectoris: Secondary | ICD-10-CM | POA: Diagnosis not present

## 2014-08-03 DIAGNOSIS — I10 Essential (primary) hypertension: Secondary | ICD-10-CM | POA: Diagnosis not present

## 2014-08-03 DIAGNOSIS — M6281 Muscle weakness (generalized): Secondary | ICD-10-CM | POA: Diagnosis not present

## 2014-08-06 DIAGNOSIS — I739 Peripheral vascular disease, unspecified: Secondary | ICD-10-CM | POA: Diagnosis not present

## 2014-08-06 DIAGNOSIS — I2510 Atherosclerotic heart disease of native coronary artery without angina pectoris: Secondary | ICD-10-CM | POA: Diagnosis not present

## 2014-08-06 DIAGNOSIS — I200 Unstable angina: Secondary | ICD-10-CM | POA: Diagnosis not present

## 2014-08-06 DIAGNOSIS — G629 Polyneuropathy, unspecified: Secondary | ICD-10-CM | POA: Diagnosis not present

## 2014-08-06 DIAGNOSIS — M6281 Muscle weakness (generalized): Secondary | ICD-10-CM | POA: Diagnosis not present

## 2014-08-06 DIAGNOSIS — I10 Essential (primary) hypertension: Secondary | ICD-10-CM | POA: Diagnosis not present

## 2014-08-07 DIAGNOSIS — I200 Unstable angina: Secondary | ICD-10-CM | POA: Diagnosis not present

## 2014-08-07 DIAGNOSIS — M6281 Muscle weakness (generalized): Secondary | ICD-10-CM | POA: Diagnosis not present

## 2014-08-07 DIAGNOSIS — I10 Essential (primary) hypertension: Secondary | ICD-10-CM | POA: Diagnosis not present

## 2014-08-07 DIAGNOSIS — G629 Polyneuropathy, unspecified: Secondary | ICD-10-CM | POA: Diagnosis not present

## 2014-08-07 DIAGNOSIS — I2510 Atherosclerotic heart disease of native coronary artery without angina pectoris: Secondary | ICD-10-CM | POA: Diagnosis not present

## 2014-08-07 DIAGNOSIS — I739 Peripheral vascular disease, unspecified: Secondary | ICD-10-CM | POA: Diagnosis not present

## 2014-08-08 DIAGNOSIS — G629 Polyneuropathy, unspecified: Secondary | ICD-10-CM | POA: Diagnosis not present

## 2014-08-08 DIAGNOSIS — I200 Unstable angina: Secondary | ICD-10-CM | POA: Diagnosis not present

## 2014-08-08 DIAGNOSIS — M6281 Muscle weakness (generalized): Secondary | ICD-10-CM | POA: Diagnosis not present

## 2014-08-08 DIAGNOSIS — I10 Essential (primary) hypertension: Secondary | ICD-10-CM | POA: Diagnosis not present

## 2014-08-08 DIAGNOSIS — I2510 Atherosclerotic heart disease of native coronary artery without angina pectoris: Secondary | ICD-10-CM | POA: Diagnosis not present

## 2014-08-08 DIAGNOSIS — I739 Peripheral vascular disease, unspecified: Secondary | ICD-10-CM | POA: Diagnosis not present

## 2014-08-10 DIAGNOSIS — I2510 Atherosclerotic heart disease of native coronary artery without angina pectoris: Secondary | ICD-10-CM | POA: Diagnosis not present

## 2014-08-10 DIAGNOSIS — I200 Unstable angina: Secondary | ICD-10-CM | POA: Diagnosis not present

## 2014-08-10 DIAGNOSIS — G629 Polyneuropathy, unspecified: Secondary | ICD-10-CM | POA: Diagnosis not present

## 2014-08-10 DIAGNOSIS — I739 Peripheral vascular disease, unspecified: Secondary | ICD-10-CM | POA: Diagnosis not present

## 2014-08-10 DIAGNOSIS — M6281 Muscle weakness (generalized): Secondary | ICD-10-CM | POA: Diagnosis not present

## 2014-08-10 DIAGNOSIS — I10 Essential (primary) hypertension: Secondary | ICD-10-CM | POA: Diagnosis not present

## 2014-08-13 DIAGNOSIS — G629 Polyneuropathy, unspecified: Secondary | ICD-10-CM | POA: Diagnosis not present

## 2014-08-13 DIAGNOSIS — I200 Unstable angina: Secondary | ICD-10-CM | POA: Diagnosis not present

## 2014-08-13 DIAGNOSIS — I10 Essential (primary) hypertension: Secondary | ICD-10-CM | POA: Diagnosis not present

## 2014-08-13 DIAGNOSIS — I739 Peripheral vascular disease, unspecified: Secondary | ICD-10-CM | POA: Diagnosis not present

## 2014-08-13 DIAGNOSIS — I2510 Atherosclerotic heart disease of native coronary artery without angina pectoris: Secondary | ICD-10-CM | POA: Diagnosis not present

## 2014-08-13 DIAGNOSIS — M6281 Muscle weakness (generalized): Secondary | ICD-10-CM | POA: Diagnosis not present

## 2014-08-14 DIAGNOSIS — M6281 Muscle weakness (generalized): Secondary | ICD-10-CM | POA: Diagnosis not present

## 2014-08-14 DIAGNOSIS — I739 Peripheral vascular disease, unspecified: Secondary | ICD-10-CM | POA: Diagnosis not present

## 2014-08-14 DIAGNOSIS — G629 Polyneuropathy, unspecified: Secondary | ICD-10-CM | POA: Diagnosis not present

## 2014-08-14 DIAGNOSIS — I10 Essential (primary) hypertension: Secondary | ICD-10-CM | POA: Diagnosis not present

## 2014-08-14 DIAGNOSIS — I2510 Atherosclerotic heart disease of native coronary artery without angina pectoris: Secondary | ICD-10-CM | POA: Diagnosis not present

## 2014-08-14 DIAGNOSIS — I200 Unstable angina: Secondary | ICD-10-CM | POA: Diagnosis not present

## 2014-08-15 DIAGNOSIS — G629 Polyneuropathy, unspecified: Secondary | ICD-10-CM | POA: Diagnosis not present

## 2014-08-15 DIAGNOSIS — I739 Peripheral vascular disease, unspecified: Secondary | ICD-10-CM | POA: Diagnosis not present

## 2014-08-15 DIAGNOSIS — I10 Essential (primary) hypertension: Secondary | ICD-10-CM | POA: Diagnosis not present

## 2014-08-15 DIAGNOSIS — I2510 Atherosclerotic heart disease of native coronary artery without angina pectoris: Secondary | ICD-10-CM | POA: Diagnosis not present

## 2014-08-15 DIAGNOSIS — I200 Unstable angina: Secondary | ICD-10-CM | POA: Diagnosis not present

## 2014-08-15 DIAGNOSIS — M6281 Muscle weakness (generalized): Secondary | ICD-10-CM | POA: Diagnosis not present

## 2014-08-17 DIAGNOSIS — G629 Polyneuropathy, unspecified: Secondary | ICD-10-CM | POA: Diagnosis not present

## 2014-08-17 DIAGNOSIS — I200 Unstable angina: Secondary | ICD-10-CM | POA: Diagnosis not present

## 2014-08-17 DIAGNOSIS — I10 Essential (primary) hypertension: Secondary | ICD-10-CM | POA: Diagnosis not present

## 2014-08-17 DIAGNOSIS — I739 Peripheral vascular disease, unspecified: Secondary | ICD-10-CM | POA: Diagnosis not present

## 2014-08-17 DIAGNOSIS — M6281 Muscle weakness (generalized): Secondary | ICD-10-CM | POA: Diagnosis not present

## 2014-08-17 DIAGNOSIS — I2510 Atherosclerotic heart disease of native coronary artery without angina pectoris: Secondary | ICD-10-CM | POA: Diagnosis not present

## 2014-08-21 DIAGNOSIS — M6281 Muscle weakness (generalized): Secondary | ICD-10-CM | POA: Diagnosis not present

## 2014-08-21 DIAGNOSIS — I10 Essential (primary) hypertension: Secondary | ICD-10-CM | POA: Diagnosis not present

## 2014-08-21 DIAGNOSIS — G629 Polyneuropathy, unspecified: Secondary | ICD-10-CM | POA: Diagnosis not present

## 2014-08-21 DIAGNOSIS — I200 Unstable angina: Secondary | ICD-10-CM | POA: Diagnosis not present

## 2014-08-21 DIAGNOSIS — I739 Peripheral vascular disease, unspecified: Secondary | ICD-10-CM | POA: Diagnosis not present

## 2014-08-21 DIAGNOSIS — I2510 Atherosclerotic heart disease of native coronary artery without angina pectoris: Secondary | ICD-10-CM | POA: Diagnosis not present

## 2014-08-22 DIAGNOSIS — I10 Essential (primary) hypertension: Secondary | ICD-10-CM | POA: Diagnosis not present

## 2014-08-22 DIAGNOSIS — M6281 Muscle weakness (generalized): Secondary | ICD-10-CM | POA: Diagnosis not present

## 2014-08-22 DIAGNOSIS — I200 Unstable angina: Secondary | ICD-10-CM | POA: Diagnosis not present

## 2014-08-22 DIAGNOSIS — I739 Peripheral vascular disease, unspecified: Secondary | ICD-10-CM | POA: Diagnosis not present

## 2014-08-22 DIAGNOSIS — I2510 Atherosclerotic heart disease of native coronary artery without angina pectoris: Secondary | ICD-10-CM | POA: Diagnosis not present

## 2014-08-22 DIAGNOSIS — G629 Polyneuropathy, unspecified: Secondary | ICD-10-CM | POA: Diagnosis not present

## 2014-08-23 DIAGNOSIS — I10 Essential (primary) hypertension: Secondary | ICD-10-CM | POA: Diagnosis not present

## 2014-08-23 DIAGNOSIS — M6281 Muscle weakness (generalized): Secondary | ICD-10-CM | POA: Diagnosis not present

## 2014-08-23 DIAGNOSIS — I739 Peripheral vascular disease, unspecified: Secondary | ICD-10-CM | POA: Diagnosis not present

## 2014-08-23 DIAGNOSIS — I2510 Atherosclerotic heart disease of native coronary artery without angina pectoris: Secondary | ICD-10-CM | POA: Diagnosis not present

## 2014-08-23 DIAGNOSIS — I200 Unstable angina: Secondary | ICD-10-CM | POA: Diagnosis not present

## 2014-08-23 DIAGNOSIS — G629 Polyneuropathy, unspecified: Secondary | ICD-10-CM | POA: Diagnosis not present

## 2014-08-24 DIAGNOSIS — G629 Polyneuropathy, unspecified: Secondary | ICD-10-CM | POA: Diagnosis not present

## 2014-08-24 DIAGNOSIS — I739 Peripheral vascular disease, unspecified: Secondary | ICD-10-CM | POA: Diagnosis not present

## 2014-08-24 DIAGNOSIS — M6281 Muscle weakness (generalized): Secondary | ICD-10-CM | POA: Diagnosis not present

## 2014-08-24 DIAGNOSIS — I2510 Atherosclerotic heart disease of native coronary artery without angina pectoris: Secondary | ICD-10-CM | POA: Diagnosis not present

## 2014-08-24 DIAGNOSIS — I200 Unstable angina: Secondary | ICD-10-CM | POA: Diagnosis not present

## 2014-08-24 DIAGNOSIS — I10 Essential (primary) hypertension: Secondary | ICD-10-CM | POA: Diagnosis not present

## 2014-08-28 DIAGNOSIS — I2510 Atherosclerotic heart disease of native coronary artery without angina pectoris: Secondary | ICD-10-CM | POA: Diagnosis not present

## 2014-08-28 DIAGNOSIS — M6281 Muscle weakness (generalized): Secondary | ICD-10-CM | POA: Diagnosis not present

## 2014-08-28 DIAGNOSIS — G629 Polyneuropathy, unspecified: Secondary | ICD-10-CM | POA: Diagnosis not present

## 2014-08-28 DIAGNOSIS — I200 Unstable angina: Secondary | ICD-10-CM | POA: Diagnosis not present

## 2014-08-28 DIAGNOSIS — I739 Peripheral vascular disease, unspecified: Secondary | ICD-10-CM | POA: Diagnosis not present

## 2014-08-28 DIAGNOSIS — I10 Essential (primary) hypertension: Secondary | ICD-10-CM | POA: Diagnosis not present

## 2014-08-29 DIAGNOSIS — I200 Unstable angina: Secondary | ICD-10-CM | POA: Diagnosis not present

## 2014-08-29 DIAGNOSIS — I10 Essential (primary) hypertension: Secondary | ICD-10-CM | POA: Diagnosis not present

## 2014-08-29 DIAGNOSIS — G629 Polyneuropathy, unspecified: Secondary | ICD-10-CM | POA: Diagnosis not present

## 2014-08-29 DIAGNOSIS — I2510 Atherosclerotic heart disease of native coronary artery without angina pectoris: Secondary | ICD-10-CM | POA: Diagnosis not present

## 2014-08-31 DIAGNOSIS — M6281 Muscle weakness (generalized): Secondary | ICD-10-CM | POA: Diagnosis not present

## 2014-08-31 DIAGNOSIS — I739 Peripheral vascular disease, unspecified: Secondary | ICD-10-CM | POA: Diagnosis not present

## 2014-08-31 DIAGNOSIS — I200 Unstable angina: Secondary | ICD-10-CM | POA: Diagnosis not present

## 2014-08-31 DIAGNOSIS — G629 Polyneuropathy, unspecified: Secondary | ICD-10-CM | POA: Diagnosis not present

## 2014-08-31 DIAGNOSIS — I2510 Atherosclerotic heart disease of native coronary artery without angina pectoris: Secondary | ICD-10-CM | POA: Diagnosis not present

## 2014-08-31 DIAGNOSIS — I10 Essential (primary) hypertension: Secondary | ICD-10-CM | POA: Diagnosis not present

## 2014-09-03 DIAGNOSIS — I739 Peripheral vascular disease, unspecified: Secondary | ICD-10-CM | POA: Diagnosis not present

## 2014-09-03 DIAGNOSIS — M6281 Muscle weakness (generalized): Secondary | ICD-10-CM | POA: Diagnosis not present

## 2014-09-03 DIAGNOSIS — G629 Polyneuropathy, unspecified: Secondary | ICD-10-CM | POA: Diagnosis not present

## 2014-09-03 DIAGNOSIS — I10 Essential (primary) hypertension: Secondary | ICD-10-CM | POA: Diagnosis not present

## 2014-09-03 DIAGNOSIS — I200 Unstable angina: Secondary | ICD-10-CM | POA: Diagnosis not present

## 2014-09-03 DIAGNOSIS — I2510 Atherosclerotic heart disease of native coronary artery without angina pectoris: Secondary | ICD-10-CM | POA: Diagnosis not present

## 2014-09-04 DIAGNOSIS — M6281 Muscle weakness (generalized): Secondary | ICD-10-CM | POA: Diagnosis not present

## 2014-09-04 DIAGNOSIS — I2510 Atherosclerotic heart disease of native coronary artery without angina pectoris: Secondary | ICD-10-CM | POA: Diagnosis not present

## 2014-09-04 DIAGNOSIS — I739 Peripheral vascular disease, unspecified: Secondary | ICD-10-CM | POA: Diagnosis not present

## 2014-09-04 DIAGNOSIS — I10 Essential (primary) hypertension: Secondary | ICD-10-CM | POA: Diagnosis not present

## 2014-09-04 DIAGNOSIS — G629 Polyneuropathy, unspecified: Secondary | ICD-10-CM | POA: Diagnosis not present

## 2014-09-04 DIAGNOSIS — I200 Unstable angina: Secondary | ICD-10-CM | POA: Diagnosis not present

## 2014-09-05 DIAGNOSIS — I2510 Atherosclerotic heart disease of native coronary artery without angina pectoris: Secondary | ICD-10-CM | POA: Diagnosis not present

## 2014-09-05 DIAGNOSIS — G629 Polyneuropathy, unspecified: Secondary | ICD-10-CM | POA: Diagnosis not present

## 2014-09-05 DIAGNOSIS — I10 Essential (primary) hypertension: Secondary | ICD-10-CM | POA: Diagnosis not present

## 2014-09-05 DIAGNOSIS — M6281 Muscle weakness (generalized): Secondary | ICD-10-CM | POA: Diagnosis not present

## 2014-09-05 DIAGNOSIS — I200 Unstable angina: Secondary | ICD-10-CM | POA: Diagnosis not present

## 2014-09-05 DIAGNOSIS — I739 Peripheral vascular disease, unspecified: Secondary | ICD-10-CM | POA: Diagnosis not present

## 2014-09-07 DIAGNOSIS — M6281 Muscle weakness (generalized): Secondary | ICD-10-CM | POA: Diagnosis not present

## 2014-09-07 DIAGNOSIS — I200 Unstable angina: Secondary | ICD-10-CM | POA: Diagnosis not present

## 2014-09-07 DIAGNOSIS — I10 Essential (primary) hypertension: Secondary | ICD-10-CM | POA: Diagnosis not present

## 2014-09-07 DIAGNOSIS — I739 Peripheral vascular disease, unspecified: Secondary | ICD-10-CM | POA: Diagnosis not present

## 2014-09-07 DIAGNOSIS — I2510 Atherosclerotic heart disease of native coronary artery without angina pectoris: Secondary | ICD-10-CM | POA: Diagnosis not present

## 2014-09-07 DIAGNOSIS — G629 Polyneuropathy, unspecified: Secondary | ICD-10-CM | POA: Diagnosis not present

## 2014-09-10 DIAGNOSIS — I739 Peripheral vascular disease, unspecified: Secondary | ICD-10-CM | POA: Diagnosis not present

## 2014-09-10 DIAGNOSIS — G629 Polyneuropathy, unspecified: Secondary | ICD-10-CM | POA: Diagnosis not present

## 2014-09-10 DIAGNOSIS — M6281 Muscle weakness (generalized): Secondary | ICD-10-CM | POA: Diagnosis not present

## 2014-09-10 DIAGNOSIS — I10 Essential (primary) hypertension: Secondary | ICD-10-CM | POA: Diagnosis not present

## 2014-09-10 DIAGNOSIS — I2510 Atherosclerotic heart disease of native coronary artery without angina pectoris: Secondary | ICD-10-CM | POA: Diagnosis not present

## 2014-09-10 DIAGNOSIS — I200 Unstable angina: Secondary | ICD-10-CM | POA: Diagnosis not present

## 2014-09-11 DIAGNOSIS — I200 Unstable angina: Secondary | ICD-10-CM | POA: Diagnosis not present

## 2014-09-11 DIAGNOSIS — I739 Peripheral vascular disease, unspecified: Secondary | ICD-10-CM | POA: Diagnosis not present

## 2014-09-11 DIAGNOSIS — M6281 Muscle weakness (generalized): Secondary | ICD-10-CM | POA: Diagnosis not present

## 2014-09-11 DIAGNOSIS — I10 Essential (primary) hypertension: Secondary | ICD-10-CM | POA: Diagnosis not present

## 2014-09-11 DIAGNOSIS — G629 Polyneuropathy, unspecified: Secondary | ICD-10-CM | POA: Diagnosis not present

## 2014-09-11 DIAGNOSIS — I2510 Atherosclerotic heart disease of native coronary artery without angina pectoris: Secondary | ICD-10-CM | POA: Diagnosis not present

## 2014-09-12 DIAGNOSIS — M6281 Muscle weakness (generalized): Secondary | ICD-10-CM | POA: Diagnosis not present

## 2014-09-12 DIAGNOSIS — I200 Unstable angina: Secondary | ICD-10-CM | POA: Diagnosis not present

## 2014-09-12 DIAGNOSIS — I739 Peripheral vascular disease, unspecified: Secondary | ICD-10-CM | POA: Diagnosis not present

## 2014-09-12 DIAGNOSIS — G629 Polyneuropathy, unspecified: Secondary | ICD-10-CM | POA: Diagnosis not present

## 2014-09-12 DIAGNOSIS — I2510 Atherosclerotic heart disease of native coronary artery without angina pectoris: Secondary | ICD-10-CM | POA: Diagnosis not present

## 2014-09-12 DIAGNOSIS — I10 Essential (primary) hypertension: Secondary | ICD-10-CM | POA: Diagnosis not present

## 2014-09-14 DIAGNOSIS — I200 Unstable angina: Secondary | ICD-10-CM | POA: Diagnosis not present

## 2014-09-14 DIAGNOSIS — G629 Polyneuropathy, unspecified: Secondary | ICD-10-CM | POA: Diagnosis not present

## 2014-09-14 DIAGNOSIS — I2510 Atherosclerotic heart disease of native coronary artery without angina pectoris: Secondary | ICD-10-CM | POA: Diagnosis not present

## 2014-09-14 DIAGNOSIS — I10 Essential (primary) hypertension: Secondary | ICD-10-CM | POA: Diagnosis not present

## 2014-09-14 DIAGNOSIS — M6281 Muscle weakness (generalized): Secondary | ICD-10-CM | POA: Diagnosis not present

## 2014-09-14 DIAGNOSIS — I739 Peripheral vascular disease, unspecified: Secondary | ICD-10-CM | POA: Diagnosis not present

## 2014-09-18 DIAGNOSIS — I10 Essential (primary) hypertension: Secondary | ICD-10-CM | POA: Diagnosis not present

## 2014-09-18 DIAGNOSIS — I200 Unstable angina: Secondary | ICD-10-CM | POA: Diagnosis not present

## 2014-09-18 DIAGNOSIS — M6281 Muscle weakness (generalized): Secondary | ICD-10-CM | POA: Diagnosis not present

## 2014-09-18 DIAGNOSIS — G629 Polyneuropathy, unspecified: Secondary | ICD-10-CM | POA: Diagnosis not present

## 2014-09-18 DIAGNOSIS — I739 Peripheral vascular disease, unspecified: Secondary | ICD-10-CM | POA: Diagnosis not present

## 2014-09-18 DIAGNOSIS — I2510 Atherosclerotic heart disease of native coronary artery without angina pectoris: Secondary | ICD-10-CM | POA: Diagnosis not present

## 2014-09-19 DIAGNOSIS — M6281 Muscle weakness (generalized): Secondary | ICD-10-CM | POA: Diagnosis not present

## 2014-09-19 DIAGNOSIS — G629 Polyneuropathy, unspecified: Secondary | ICD-10-CM | POA: Diagnosis not present

## 2014-09-19 DIAGNOSIS — I10 Essential (primary) hypertension: Secondary | ICD-10-CM | POA: Diagnosis not present

## 2014-09-19 DIAGNOSIS — I739 Peripheral vascular disease, unspecified: Secondary | ICD-10-CM | POA: Diagnosis not present

## 2014-09-19 DIAGNOSIS — I2510 Atherosclerotic heart disease of native coronary artery without angina pectoris: Secondary | ICD-10-CM | POA: Diagnosis not present

## 2014-09-19 DIAGNOSIS — I200 Unstable angina: Secondary | ICD-10-CM | POA: Diagnosis not present

## 2014-09-21 DIAGNOSIS — I10 Essential (primary) hypertension: Secondary | ICD-10-CM | POA: Diagnosis not present

## 2014-09-21 DIAGNOSIS — I2510 Atherosclerotic heart disease of native coronary artery without angina pectoris: Secondary | ICD-10-CM | POA: Diagnosis not present

## 2014-09-21 DIAGNOSIS — M6281 Muscle weakness (generalized): Secondary | ICD-10-CM | POA: Diagnosis not present

## 2014-09-21 DIAGNOSIS — I200 Unstable angina: Secondary | ICD-10-CM | POA: Diagnosis not present

## 2014-09-21 DIAGNOSIS — I739 Peripheral vascular disease, unspecified: Secondary | ICD-10-CM | POA: Diagnosis not present

## 2014-09-21 DIAGNOSIS — G629 Polyneuropathy, unspecified: Secondary | ICD-10-CM | POA: Diagnosis not present

## 2014-09-24 DIAGNOSIS — I200 Unstable angina: Secondary | ICD-10-CM | POA: Diagnosis not present

## 2014-09-24 DIAGNOSIS — I10 Essential (primary) hypertension: Secondary | ICD-10-CM | POA: Diagnosis not present

## 2014-09-24 DIAGNOSIS — G629 Polyneuropathy, unspecified: Secondary | ICD-10-CM | POA: Diagnosis not present

## 2014-09-24 DIAGNOSIS — I739 Peripheral vascular disease, unspecified: Secondary | ICD-10-CM | POA: Diagnosis not present

## 2014-09-24 DIAGNOSIS — M6281 Muscle weakness (generalized): Secondary | ICD-10-CM | POA: Diagnosis not present

## 2014-09-24 DIAGNOSIS — I2510 Atherosclerotic heart disease of native coronary artery without angina pectoris: Secondary | ICD-10-CM | POA: Diagnosis not present

## 2014-09-25 DIAGNOSIS — I739 Peripheral vascular disease, unspecified: Secondary | ICD-10-CM | POA: Diagnosis not present

## 2014-09-25 DIAGNOSIS — I2510 Atherosclerotic heart disease of native coronary artery without angina pectoris: Secondary | ICD-10-CM | POA: Diagnosis not present

## 2014-09-25 DIAGNOSIS — M6281 Muscle weakness (generalized): Secondary | ICD-10-CM | POA: Diagnosis not present

## 2014-09-25 DIAGNOSIS — I200 Unstable angina: Secondary | ICD-10-CM | POA: Diagnosis not present

## 2014-09-25 DIAGNOSIS — I10 Essential (primary) hypertension: Secondary | ICD-10-CM | POA: Diagnosis not present

## 2014-09-25 DIAGNOSIS — G629 Polyneuropathy, unspecified: Secondary | ICD-10-CM | POA: Diagnosis not present

## 2014-09-26 DIAGNOSIS — I10 Essential (primary) hypertension: Secondary | ICD-10-CM | POA: Diagnosis not present

## 2014-09-26 DIAGNOSIS — M6281 Muscle weakness (generalized): Secondary | ICD-10-CM | POA: Diagnosis not present

## 2014-09-26 DIAGNOSIS — I2510 Atherosclerotic heart disease of native coronary artery without angina pectoris: Secondary | ICD-10-CM | POA: Diagnosis not present

## 2014-09-26 DIAGNOSIS — I739 Peripheral vascular disease, unspecified: Secondary | ICD-10-CM | POA: Diagnosis not present

## 2014-09-26 DIAGNOSIS — G629 Polyneuropathy, unspecified: Secondary | ICD-10-CM | POA: Diagnosis not present

## 2014-09-26 DIAGNOSIS — I200 Unstable angina: Secondary | ICD-10-CM | POA: Diagnosis not present

## 2014-09-28 DIAGNOSIS — G629 Polyneuropathy, unspecified: Secondary | ICD-10-CM | POA: Diagnosis not present

## 2014-09-28 DIAGNOSIS — I200 Unstable angina: Secondary | ICD-10-CM | POA: Diagnosis not present

## 2014-09-28 DIAGNOSIS — I739 Peripheral vascular disease, unspecified: Secondary | ICD-10-CM | POA: Diagnosis not present

## 2014-09-28 DIAGNOSIS — M6281 Muscle weakness (generalized): Secondary | ICD-10-CM | POA: Diagnosis not present

## 2014-09-28 DIAGNOSIS — I2510 Atherosclerotic heart disease of native coronary artery without angina pectoris: Secondary | ICD-10-CM | POA: Diagnosis not present

## 2014-09-28 DIAGNOSIS — I10 Essential (primary) hypertension: Secondary | ICD-10-CM | POA: Diagnosis not present

## 2014-10-01 DIAGNOSIS — I2510 Atherosclerotic heart disease of native coronary artery without angina pectoris: Secondary | ICD-10-CM | POA: Diagnosis not present

## 2014-10-01 DIAGNOSIS — I739 Peripheral vascular disease, unspecified: Secondary | ICD-10-CM | POA: Diagnosis not present

## 2014-10-01 DIAGNOSIS — I200 Unstable angina: Secondary | ICD-10-CM | POA: Diagnosis not present

## 2014-10-01 DIAGNOSIS — G629 Polyneuropathy, unspecified: Secondary | ICD-10-CM | POA: Diagnosis not present

## 2014-10-01 DIAGNOSIS — I10 Essential (primary) hypertension: Secondary | ICD-10-CM | POA: Diagnosis not present

## 2014-10-01 DIAGNOSIS — M6281 Muscle weakness (generalized): Secondary | ICD-10-CM | POA: Diagnosis not present

## 2014-10-02 DIAGNOSIS — I2510 Atherosclerotic heart disease of native coronary artery without angina pectoris: Secondary | ICD-10-CM | POA: Diagnosis not present

## 2014-10-02 DIAGNOSIS — I739 Peripheral vascular disease, unspecified: Secondary | ICD-10-CM | POA: Diagnosis not present

## 2014-10-02 DIAGNOSIS — I200 Unstable angina: Secondary | ICD-10-CM | POA: Diagnosis not present

## 2014-10-02 DIAGNOSIS — I10 Essential (primary) hypertension: Secondary | ICD-10-CM | POA: Diagnosis not present

## 2014-10-02 DIAGNOSIS — M6281 Muscle weakness (generalized): Secondary | ICD-10-CM | POA: Diagnosis not present

## 2014-10-02 DIAGNOSIS — G629 Polyneuropathy, unspecified: Secondary | ICD-10-CM | POA: Diagnosis not present

## 2014-10-03 DIAGNOSIS — I10 Essential (primary) hypertension: Secondary | ICD-10-CM | POA: Diagnosis not present

## 2014-10-03 DIAGNOSIS — I2510 Atherosclerotic heart disease of native coronary artery without angina pectoris: Secondary | ICD-10-CM | POA: Diagnosis not present

## 2014-10-03 DIAGNOSIS — G629 Polyneuropathy, unspecified: Secondary | ICD-10-CM | POA: Diagnosis not present

## 2014-10-03 DIAGNOSIS — I200 Unstable angina: Secondary | ICD-10-CM | POA: Diagnosis not present

## 2014-10-03 DIAGNOSIS — I739 Peripheral vascular disease, unspecified: Secondary | ICD-10-CM | POA: Diagnosis not present

## 2014-10-03 DIAGNOSIS — M6281 Muscle weakness (generalized): Secondary | ICD-10-CM | POA: Diagnosis not present

## 2014-10-05 DIAGNOSIS — I200 Unstable angina: Secondary | ICD-10-CM | POA: Diagnosis not present

## 2014-10-05 DIAGNOSIS — I10 Essential (primary) hypertension: Secondary | ICD-10-CM | POA: Diagnosis not present

## 2014-10-05 DIAGNOSIS — I2510 Atherosclerotic heart disease of native coronary artery without angina pectoris: Secondary | ICD-10-CM | POA: Diagnosis not present

## 2014-10-05 DIAGNOSIS — I739 Peripheral vascular disease, unspecified: Secondary | ICD-10-CM | POA: Diagnosis not present

## 2014-10-05 DIAGNOSIS — M6281 Muscle weakness (generalized): Secondary | ICD-10-CM | POA: Diagnosis not present

## 2014-10-05 DIAGNOSIS — G629 Polyneuropathy, unspecified: Secondary | ICD-10-CM | POA: Diagnosis not present

## 2014-10-08 DIAGNOSIS — G629 Polyneuropathy, unspecified: Secondary | ICD-10-CM | POA: Diagnosis not present

## 2014-10-08 DIAGNOSIS — I739 Peripheral vascular disease, unspecified: Secondary | ICD-10-CM | POA: Diagnosis not present

## 2014-10-08 DIAGNOSIS — I2510 Atherosclerotic heart disease of native coronary artery without angina pectoris: Secondary | ICD-10-CM | POA: Diagnosis not present

## 2014-10-08 DIAGNOSIS — M6281 Muscle weakness (generalized): Secondary | ICD-10-CM | POA: Diagnosis not present

## 2014-10-08 DIAGNOSIS — I200 Unstable angina: Secondary | ICD-10-CM | POA: Diagnosis not present

## 2014-10-08 DIAGNOSIS — I10 Essential (primary) hypertension: Secondary | ICD-10-CM | POA: Diagnosis not present

## 2014-10-09 DIAGNOSIS — I10 Essential (primary) hypertension: Secondary | ICD-10-CM | POA: Diagnosis not present

## 2014-10-09 DIAGNOSIS — I200 Unstable angina: Secondary | ICD-10-CM | POA: Diagnosis not present

## 2014-10-09 DIAGNOSIS — I2510 Atherosclerotic heart disease of native coronary artery without angina pectoris: Secondary | ICD-10-CM | POA: Diagnosis not present

## 2014-10-09 DIAGNOSIS — G629 Polyneuropathy, unspecified: Secondary | ICD-10-CM | POA: Diagnosis not present

## 2014-10-09 DIAGNOSIS — M6281 Muscle weakness (generalized): Secondary | ICD-10-CM | POA: Diagnosis not present

## 2014-10-09 DIAGNOSIS — I739 Peripheral vascular disease, unspecified: Secondary | ICD-10-CM | POA: Diagnosis not present

## 2014-10-10 DIAGNOSIS — I739 Peripheral vascular disease, unspecified: Secondary | ICD-10-CM | POA: Diagnosis not present

## 2014-10-10 DIAGNOSIS — I200 Unstable angina: Secondary | ICD-10-CM | POA: Diagnosis not present

## 2014-10-10 DIAGNOSIS — I10 Essential (primary) hypertension: Secondary | ICD-10-CM | POA: Diagnosis not present

## 2014-10-10 DIAGNOSIS — I2510 Atherosclerotic heart disease of native coronary artery without angina pectoris: Secondary | ICD-10-CM | POA: Diagnosis not present

## 2014-10-10 DIAGNOSIS — G629 Polyneuropathy, unspecified: Secondary | ICD-10-CM | POA: Diagnosis not present

## 2014-10-10 DIAGNOSIS — M6281 Muscle weakness (generalized): Secondary | ICD-10-CM | POA: Diagnosis not present

## 2014-10-12 DIAGNOSIS — M6281 Muscle weakness (generalized): Secondary | ICD-10-CM | POA: Diagnosis not present

## 2014-10-12 DIAGNOSIS — I200 Unstable angina: Secondary | ICD-10-CM | POA: Diagnosis not present

## 2014-10-12 DIAGNOSIS — G629 Polyneuropathy, unspecified: Secondary | ICD-10-CM | POA: Diagnosis not present

## 2014-10-12 DIAGNOSIS — I739 Peripheral vascular disease, unspecified: Secondary | ICD-10-CM | POA: Diagnosis not present

## 2014-10-12 DIAGNOSIS — I10 Essential (primary) hypertension: Secondary | ICD-10-CM | POA: Diagnosis not present

## 2014-10-12 DIAGNOSIS — I2510 Atherosclerotic heart disease of native coronary artery without angina pectoris: Secondary | ICD-10-CM | POA: Diagnosis not present

## 2014-11-03 ENCOUNTER — Ambulatory Visit (INDEPENDENT_AMBULATORY_CARE_PROVIDER_SITE_OTHER): Payer: Medicare Other | Admitting: Internal Medicine

## 2014-11-03 VITALS — BP 158/80 | HR 64 | Temp 97.6°F | Resp 20

## 2014-11-03 DIAGNOSIS — R4189 Other symptoms and signs involving cognitive functions and awareness: Secondary | ICD-10-CM | POA: Diagnosis not present

## 2014-11-03 DIAGNOSIS — I200 Unstable angina: Secondary | ICD-10-CM

## 2014-11-03 MED ORDER — LITHIUM CARBONATE ER 300 MG PO TBCR: EXTENDED_RELEASE_TABLET | ORAL | Status: DC

## 2014-11-03 NOTE — Progress Notes (Addendum)
Subjective:    Patient ID: [name] [name], male    DOB: 07-16-1925, 81 y.o.   MRN: [id number] This chart was scribed for [name], MD by Marti Sleigh, Medical Scribe. This patient was seen in Room 10 and the patient's care was started a 12:23 PM.  He is brought in by his daughter:  Chief Complaint  Patient presents with  . Altered Mental Status    patient has history of mental break in past after moving from [place], when wife died. This week has had stress and increased confusion    HPI HPI Comments: [name] [name] is a 81 y.o. male brought in by his daughter due to concerns about cognitive changes over the past few days. The daughter states that he has been asking for his wife who has been dead for 10 years. He has been waking up odd hours, and doing dishes and other household chores; he has been more controlling than usual, and has been telling more stories from the past. He has had increasing problems over the week. His family is visiting from [place] and he has been very stressed by this. His daughter states that he had sx like these in the late nineties during a period of great stress, and was prescribed lithium which improved his sx dramatically. He has had episodes of other problems consider to be mood changes.  Addendum 11/06/14:important change to notes called in by daughter:  Expand All Collapse All   Pt's daughter Cheri Fowler is wanting to let dr Laney Pastor know that patient is being seen today at the [place] and being evaluated for ptsd. She also wanted to let him know that the notes in his 11/03/14 avs notes that the first breakdown for patient ws when he went to back to [place] and that he is divorced from wife she is not deceased, and the second breack down was when he went back to [place] and she is wanting to know if that can be changed she doesn't want this to seem life grief related illness   Best number [phone]      This fits with my history  His medical problems have been  stable and he has not had any new medications. He has had no motor losses or other weakness. His appetite remains very good.  Patient Active Problem List that are pertinent    Diagnosis Date Noted  . Unstable angina ([place]) 05/10/2014  . Coronary artery disease due to lipid rich plaque   . BPH (benign prostatic hypertrophy) 05/31/2013  . Frequent falls 04/10/2013  . Weakness 04/10/2013  . Peripheral neuropathy ([place]) 04/05/2013    Current outpatient prescriptions:  .  amLODipine (NORVASC) 10 MG tablet, Take 5 mg by mouth daily. Take 1/2 tablet For HTN .  aspirin 81 MG tablet, Take 81 mg by mouth daily .  atorvastatin (LIPITOR) 10 MG tablet, Take 10 mg by mouth daily.  .  calcium-vitamin D (OSCAL WITH D) 500-200 MG-UNIT per tablet, Take 2 tablets by mouth daily with breakfast., Disp: , Rfl:  .  clopidogrel (PLAVIX) 75 MG tablet, Take 1 tablet (75 mg total) by mouth daily. .  Cyanocobalamin (B-12 COMPLIANCE INJECTION IJ), Inject as directed every 30 (thirty) days .  finasteride (PROSCAR) 5 MG tablet, Take 5 mg by mouth daily., Disp: , Rfl:  .  fish oil-omega-3 fatty acids 1000 MG capsule, Take 2 g by mouth daily., Disp: , Rfl:  .  hydroxypropyl methylcellulose / hypromellose (ISOPTO TEARS / GONIOVISC) 2.5 %  ophthalmic solution, Place 1 drop into both eyes as needed for dry eyes., Disp: , Rfl:  .  isosorbide mononitrate (IMDUR) 60 MG 24 hr tablet, Take 1 tablet (60 mg total) by mouth every evening., Disp: 30 tablet, Rfl: 11 .  lisinopril-hydrochlorothiazide (PRINZIDE,ZESTORETIC) 20-12.5 MG per tablet, Take 1 tablet by mouth 2 (two) times daily., Disp: , Rfl:  .  metoprolol (LOPRESSOR) 100 MG tablet, Take 100 mg by mouth 2 (two) times daily., Disp: , Rfl:  .  pantoprazole (PROTONIX) 40 MG tablet, Take 1 tablet (40 mg total) by mouth daily., Disp: 30 tablet, Rfl: 11 .  tamsulosin (FLOMAX) 0.4 MG CAPS capsule, Take 1 capsule (0.4 mg total) by mouth daily after supper., Disp: 30 capsule, Rfl: 0 .   levalbuterol (XOPENEX HFA) 45 MCG/ACT inhaler, Inhale 2 puffs into the lungs every 6 (six) hours as needed for wheezing. He needs a spacer device as well (Patient not taking: Reported on 11/03/2014), Disp: 1 Inhaler, Rfl: 12 .  lithium carbonate (LITHOBID) 300 MG CR tablet, 1 tablet once a day for 2 days then twice a day until next evaluation, Disp: 60 tablet, Rfl: 0 .  Naproxen Sod-Diphenhydramine (ALEVE PM PO), Take by mouth., Disp: , Rfl:  .  traMADol (ULTRAM) 50 MG tablet, Take 1 tablet (50 mg total) by mouth every 6 (six) hours as needed. (Patient not taking: Reported on 07/17/2014), Disp: 25 tablet, Rfl: 0    Review of Systems  Constitutional: Negative for fever, chills, diaphoresis, activity change, appetite change and fatigue.  HENT: Negative for trouble swallowing.   Eyes: Negative for visual disturbance.  Respiratory: Negative for cough, shortness of breath and wheezing.   Cardiovascular: Negative for chest pain, palpitations and leg swelling.  Gastrointestinal: Negative for abdominal pain.  Genitourinary: Negative for dysuria, urgency, frequency and difficulty urinating.  Neurological: Negative for headaches.  Psychiatric/Behavioral: Positive for behavioral problems, confusion and sleep disturbance. Negative for hallucinations.       Objective:  BP 158/80 mmHg  Pulse 64  Temp(Src) 97.6 F (36.4 C) (Oral)  Resp 20  SpO2 98%  Physical Exam  Constitutional: He is oriented to person, place, and time. He appears well-developed and well-nourished. No distress.  Confined to wheelchair  HENT:  Head: Normocephalic and atraumatic.  Eyes: EOM are normal. Pupils are equal, round, and reactive to light.  Neck: Normal range of motion. Neck supple. No thyromegaly present.  Cardiovascular: Normal rate, regular rhythm, normal heart sounds and intact distal pulses.   No murmur heard. Pulmonary/Chest: Effort normal and breath sounds normal. No respiratory distress.  Musculoskeletal: Normal  range of motion. He exhibits no edema.  Neurological: He is alert and oriented to person, place, and time. He has normal reflexes. No cranial nerve deficit. Coordination normal.  Good motor in all extremities  Skin: Skin is warm and dry. He is not diaphoretic.  Psychiatric:  Jovial/very talkative but often about things from the past that don't fit in with questions and answers. Never appears confused but doesn't give concise answers to questions asked.  Nursing note and vitals reviewed.      Assessment & Plan:  1-cognitive change 2-cardiovascular, arteriosclerotic disease 3-wheelchair bound secondary to neurological problems with weakness  There is no evidence of an acute cerebrovascular event but more of a slow change in cognitive function that is correlated with the stress in his current life His daughter/caretaker is here with him and has noticed no changes that could represent a stroke or TIA  Plan-restart lithium  300 daily for 2 days then 300 twice a day as extended release formula Follow-up at the [place] in 4 days//sooner if worse  I have completed the patient encounter in its entirety as documented by the scribe, with editing by me where necessary. Stephen Turnbaugh P. Laney Pastor, M.D.   By signing my name below, I, Judithe Modest, attest that this documentation has been prepared under the direction and in the presence of [name], MD. Electronically Signed: Judithe Modest, ER Scribe. 11/03/2014. 12:23 PM.

## 2014-11-06 ENCOUNTER — Telehealth: Payer: Self-pay

## 2014-11-06 NOTE — Telephone Encounter (Signed)
Pt's daughter Cheri Fowler is wanting to let dr Laney Pastor know that patient is being seen today at the [place] and being evaluated for ptsd.  She also wanted to let him know that the notes in his 11/03/14 avs notes that the first breakdown for patient ws when he went to back to [place] and that he is divorced from wife she is not deceased, and the second breack down was when he went back to [place] and she is wanting to know if that can be changed she doesn't want this to seem life grief related illness   Best number [phone]   Dr Crisoforo Oxford is who he will be seeing in [place]

## 2014-11-06 NOTE — Telephone Encounter (Signed)
Expand All Collapse All   Pt's daughter Cheri Fowler is wanting to let dr Laney Pastor know that patient is being seen today at the [place] and being evaluated for ptsd. She also wanted to let him know that the notes in his 11/03/14 avs notes that the first breakdown for patient ws when he went to back to [place] and that he is divorced from wife she is not deceased, and the second breack down was when he went back to [place] and she is wanting to know if that can be changed she doesn't want this to seem life grief related illness   Best number [phone]       This was added to the clinical note Do we need to send our notes to Dr Crisoforo Oxford?

## 2014-11-07 ENCOUNTER — Telehealth: Payer: Self-pay | Admitting: Cardiovascular Disease

## 2014-11-07 DIAGNOSIS — Z79899 Other long term (current) drug therapy: Secondary | ICD-10-CM

## 2014-11-07 DIAGNOSIS — I2510 Atherosclerotic heart disease of native coronary artery without angina pectoris: Secondary | ICD-10-CM

## 2014-11-07 NOTE — Telephone Encounter (Signed)
Spoke to Ms Laurance Flatten (patient's daughter) Information given that the sertraline will need to taken for 7 -10 days before P2Y12  Can be drawn.per Erasmo Downer. Ms Laurance Flatten is aware.  Patient need to have lab drawn @Nov  7,8 2016 at [place]. Will have to register at admission area. labslip will be mailed to patient. Contacted after result are reviewed. Ms Laurance Flatten is aware. Patient has has discontinue LITHUIM prior to the start Sertraline. Today is the first day of starting sertraline

## 2014-11-07 NOTE — Telephone Encounter (Signed)
Records sent to Dr. Crisoforo Oxford.

## 2014-11-07 NOTE — Telephone Encounter (Signed)
Pt went to his psychiatrist and he put him on Sentraline HCL 25 mg. She wants to know if this is all right for him to take?. It had a Plavix warning on it,he is on generic Plavix,aspirin and take Tyelenol, Pt have trouble sleeping at night.

## 2014-11-07 NOTE — Telephone Encounter (Signed)
Yes, please check P2Y12

## 2014-11-07 NOTE — Telephone Encounter (Signed)
Ovid Curd, can you order this please.  He will need to get it drawn at the lab at [facility].  [facility] and [facility] don't draw this one.

## 2014-11-07 NOTE — Telephone Encounter (Signed)
Mailed labslip 

## 2014-11-07 NOTE — Telephone Encounter (Signed)
Spoke with [name] [name], she states he is on another drug and took him off of the Lithium because it was too hard on the kidneys. Pt did have a kidney infection. She believes they put him on Sertraline. Medical records can we send records?

## 2014-11-07 NOTE — Telephone Encounter (Signed)
Spoke to [name] [name] to [name] [name] and [name] [name] states she was concerned about the warning on the label,she states she also contacted [facility]. She states he has PTSD episode - too much company in the last 2 months.

## 2014-11-07 NOTE — Telephone Encounter (Signed)
Sertraline can decrease effectiveness of clopidogrel.  His stent was done earlier this year.  Would you like to do a P2Y12 test to see if any interaction?    Fine with acetaminophen and aspirin.  Patient also on lithium, which can increase serum concentration of sertraline, but dose is only 25 mg, so shouldn't be a problem.

## 2014-11-09 ENCOUNTER — Observation Stay (HOSPITAL_COMMUNITY)
Admission: EM | Admit: 2014-11-09 | Discharge: 2014-11-10 | Disposition: A | Discharge status: Other Acute Inpatient Hospital | Payer: Medicare Other | Attending: Emergency Medicine | Admitting: Emergency Medicine

## 2014-11-09 ENCOUNTER — Ambulatory Visit (HOSPITAL_COMMUNITY)
Admission: RE | Admit: 2014-11-09 | Discharge: 2014-11-09 | Disposition: A | Discharge status: Home / Self Care | Payer: Medicare Other | Source: Home / Self Care | Attending: Psychiatry | Admitting: Psychiatry

## 2014-11-09 ENCOUNTER — Emergency Department (HOSPITAL_COMMUNITY): Payer: Medicare Other

## 2014-11-09 ENCOUNTER — Encounter (HOSPITAL_COMMUNITY): Payer: Self-pay | Admitting: *Deleted

## 2014-11-09 ENCOUNTER — Encounter (HOSPITAL_COMMUNITY): Payer: Self-pay | Admitting: Emergency Medicine

## 2014-11-09 DIAGNOSIS — E785 Hyperlipidemia, unspecified: Secondary | ICD-10-CM | POA: Diagnosis not present

## 2014-11-09 DIAGNOSIS — Z7982 Long term (current) use of aspirin: Secondary | ICD-10-CM | POA: Insufficient documentation

## 2014-11-09 DIAGNOSIS — Z955 Presence of coronary angioplasty implant and graft: Secondary | ICD-10-CM | POA: Diagnosis not present

## 2014-11-09 DIAGNOSIS — M810 Age-related osteoporosis without current pathological fracture: Secondary | ICD-10-CM | POA: Insufficient documentation

## 2014-11-09 DIAGNOSIS — R4182 Altered mental status, unspecified: Principal | ICD-10-CM | POA: Insufficient documentation

## 2014-11-09 DIAGNOSIS — Z9181 History of falling: Secondary | ICD-10-CM | POA: Diagnosis not present

## 2014-11-09 DIAGNOSIS — Z79899 Other long term (current) drug therapy: Secondary | ICD-10-CM | POA: Diagnosis not present

## 2014-11-09 DIAGNOSIS — N138 Other obstructive and reflux uropathy: Secondary | ICD-10-CM | POA: Diagnosis not present

## 2014-11-09 DIAGNOSIS — K219 Gastro-esophageal reflux disease without esophagitis: Secondary | ICD-10-CM | POA: Diagnosis not present

## 2014-11-09 DIAGNOSIS — F329 Major depressive disorder, single episode, unspecified: Secondary | ICD-10-CM | POA: Insufficient documentation

## 2014-11-09 DIAGNOSIS — Z87440 Personal history of urinary (tract) infections: Secondary | ICD-10-CM | POA: Diagnosis not present

## 2014-11-09 DIAGNOSIS — F99 Mental disorder, not otherwise specified: Secondary | ICD-10-CM | POA: Diagnosis not present

## 2014-11-09 DIAGNOSIS — I2510 Atherosclerotic heart disease of native coronary artery without angina pectoris: Secondary | ICD-10-CM | POA: Insufficient documentation

## 2014-11-09 DIAGNOSIS — F4310 Post-traumatic stress disorder, unspecified: Secondary | ICD-10-CM | POA: Insufficient documentation

## 2014-11-09 DIAGNOSIS — Z7902 Long term (current) use of antithrombotics/antiplatelets: Secondary | ICD-10-CM | POA: Insufficient documentation

## 2014-11-09 DIAGNOSIS — Z87891 Personal history of nicotine dependence: Secondary | ICD-10-CM | POA: Insufficient documentation

## 2014-11-09 DIAGNOSIS — Z993 Dependence on wheelchair: Secondary | ICD-10-CM | POA: Diagnosis not present

## 2014-11-09 DIAGNOSIS — N401 Enlarged prostate with lower urinary tract symptoms: Secondary | ICD-10-CM | POA: Insufficient documentation

## 2014-11-09 DIAGNOSIS — Z85828 Personal history of other malignant neoplasm of skin: Secondary | ICD-10-CM | POA: Insufficient documentation

## 2014-11-09 DIAGNOSIS — I10 Essential (primary) hypertension: Secondary | ICD-10-CM | POA: Insufficient documentation

## 2014-11-09 DIAGNOSIS — R410 Disorientation, unspecified: Secondary | ICD-10-CM | POA: Diagnosis not present

## 2014-11-09 DIAGNOSIS — R1110 Vomiting, unspecified: Secondary | ICD-10-CM | POA: Insufficient documentation

## 2014-11-09 DIAGNOSIS — G629 Polyneuropathy, unspecified: Secondary | ICD-10-CM | POA: Diagnosis not present

## 2014-11-09 DIAGNOSIS — R197 Diarrhea, unspecified: Secondary | ICD-10-CM

## 2014-11-09 DIAGNOSIS — Z791 Long term (current) use of non-steroidal anti-inflammatories (NSAID): Secondary | ICD-10-CM | POA: Diagnosis not present

## 2014-11-09 DIAGNOSIS — J9811 Atelectasis: Secondary | ICD-10-CM | POA: Diagnosis not present

## 2014-11-09 LAB — URINALYSIS, ROUTINE W REFLEX MICROSCOPIC
BILIRUBIN URINE: NEGATIVE
GLUCOSE, UA: NEGATIVE mg/dL
HGB URINE DIPSTICK: NEGATIVE
Ketones, ur: NEGATIVE mg/dL
Nitrite: NEGATIVE
PROTEIN: NEGATIVE mg/dL
Specific Gravity, Urine: 1.012 (ref 1.005–1.030)
UROBILINOGEN UA: 0.2 mg/dL (ref 0.0–1.0)
pH: 6.5 (ref 5.0–8.0)

## 2014-11-09 LAB — CBC WITH DIFFERENTIAL/PLATELET
BASOS ABS: 0 10*3/uL (ref 0.0–0.1)
Basophils Relative: 0 %
Eosinophils Absolute: 0.2 10*3/uL (ref 0.0–0.7)
Eosinophils Relative: 3 %
HEMATOCRIT: 45 % (ref 39.0–52.0)
HEMOGLOBIN: 15.1 g/dL (ref 13.0–17.0)
LYMPHS PCT: 17 %
Lymphs Abs: 1.2 10*3/uL (ref 0.7–4.0)
MCH: 30.5 pg (ref 26.0–34.0)
MCHC: 33.6 g/dL (ref 30.0–36.0)
MCV: 90.9 fL (ref 78.0–100.0)
Monocytes Absolute: 0.5 10*3/uL (ref 0.1–1.0)
Monocytes Relative: 8 %
NEUTROS ABS: 4.9 10*3/uL (ref 1.7–7.7)
NEUTROS PCT: 72 %
Platelets: 240 10*3/uL (ref 150–400)
RBC: 4.95 MIL/uL (ref 4.22–5.81)
RDW: 14.3 % (ref 11.5–15.5)
WBC: 6.8 10*3/uL (ref 4.0–10.5)

## 2014-11-09 LAB — COMPREHENSIVE METABOLIC PANEL
ALT: 14 U/L — ABNORMAL LOW (ref 17–63)
ANION GAP: 7 (ref 5–15)
AST: 17 U/L (ref 15–41)
Albumin: 3.8 g/dL (ref 3.5–5.0)
Alkaline Phosphatase: 75 U/L (ref 38–126)
BUN: 24 mg/dL — ABNORMAL HIGH (ref 6–20)
CALCIUM: 9.6 mg/dL (ref 8.9–10.3)
CO2: 28 mmol/L (ref 22–32)
Chloride: 103 mmol/L (ref 101–111)
Creatinine, Ser: 1.11 mg/dL (ref 0.61–1.24)
GFR calc non Af Amer: 58 mL/min — ABNORMAL LOW (ref >60)
GLUCOSE: 99 mg/dL (ref 65–99)
POTASSIUM: 4 mmol/L (ref 3.5–5.1)
SODIUM: 138 mmol/L (ref 135–145)
TOTAL PROTEIN: 7.1 g/dL (ref 6.5–8.1)
Total Bilirubin: 0.7 mg/dL (ref 0.3–1.2)

## 2014-11-09 LAB — URINE MICROSCOPIC-ADD ON

## 2014-11-09 MED ORDER — TAMSULOSIN HCL 0.4 MG PO CAPS
0.4000 mg | ORAL_CAPSULE | Freq: Every day | ORAL | Status: DC
  Administered 2014-11-09 – 2014-11-10 (×2): 0.4 mg via ORAL
  Filled 2014-11-09 (×2): qty 1

## 2014-11-09 MED ORDER — CALCIUM CARBONATE-VITAMIN D 500-200 MG-UNIT PO TABS
2.0000 | ORAL_TABLET | Freq: Every day | ORAL | Status: DC
  Administered 2014-11-10: 2 via ORAL
  Filled 2014-11-09 (×2): qty 2

## 2014-11-09 MED ORDER — METOPROLOL TARTRATE 25 MG PO TABS
100.0000 mg | ORAL_TABLET | Freq: Two times a day (BID) | ORAL | Status: DC
  Administered 2014-11-09 – 2014-11-10 (×3): 100 mg via ORAL
  Filled 2014-11-09 (×4): qty 4

## 2014-11-09 MED ORDER — IBUPROFEN 200 MG PO TABS: 600.0000 mg | ORAL_TABLET | Freq: Three times a day (TID) | ORAL | Status: DC | PRN

## 2014-11-09 MED ORDER — ONDANSETRON HCL 4 MG PO TABS
4.0000 mg | ORAL_TABLET | Freq: Three times a day (TID) | ORAL | Status: DC | PRN
Start: 2014-11-09 — End: 2014-11-10

## 2014-11-09 MED ORDER — CLOPIDOGREL BISULFATE 75 MG PO TABS
75.0000 mg | ORAL_TABLET | Freq: Every day | ORAL | Status: DC
  Administered 2014-11-10: 75 mg via ORAL
  Filled 2014-11-09: qty 1

## 2014-11-09 MED ORDER — PANTOPRAZOLE SODIUM 40 MG PO TBEC
40.0000 mg | DELAYED_RELEASE_TABLET | Freq: Every day | ORAL | Status: DC
  Administered 2014-11-09 – 2014-11-10 (×2): 40 mg via ORAL
  Filled 2014-11-09 (×2): qty 1

## 2014-11-09 MED ORDER — HYPROMELLOSE (GONIOSCOPIC) 2.5 % OP SOLN
1.0000 [drp] | OPHTHALMIC | Status: DC | PRN
  Filled 2014-11-09: qty 15

## 2014-11-09 MED ORDER — LISINOPRIL-HYDROCHLOROTHIAZIDE 20-12.5 MG PO TABS: 1.0000 | ORAL_TABLET | Freq: Two times a day (BID) | ORAL | Status: DC

## 2014-11-09 MED ORDER — ACETAMINOPHEN 500 MG PO TABS: 1000.0000 mg | ORAL_TABLET | Freq: Four times a day (QID) | ORAL | Status: DC | PRN

## 2014-11-09 MED ORDER — LISINOPRIL 20 MG PO TABS
20.0000 mg | ORAL_TABLET | Freq: Two times a day (BID) | ORAL | Status: DC
  Administered 2014-11-09 – 2014-11-10 (×2): 20 mg via ORAL
  Filled 2014-11-09 (×4): qty 1

## 2014-11-09 MED ORDER — ISOSORBIDE MONONITRATE ER 60 MG PO TB24
60.0000 mg | ORAL_TABLET | Freq: Every evening | ORAL | Status: DC
  Administered 2014-11-09 – 2014-11-10 (×2): 60 mg via ORAL
  Filled 2014-11-09 (×2): qty 1

## 2014-11-09 MED ORDER — OMEGA-3 FATTY ACIDS 1000 MG PO CAPS
2.0000 g | ORAL_CAPSULE | Freq: Every day | ORAL | Status: DC
  Administered 2014-11-10: 2 g via ORAL
  Filled 2014-11-09 (×2): qty 2

## 2014-11-09 MED ORDER — ALUM & MAG HYDROXIDE-SIMETH 200-200-20 MG/5ML PO SUSP: 30.0000 mL | ORAL | Status: DC | PRN

## 2014-11-09 MED ORDER — ASPIRIN EC 81 MG PO TBEC
81.0000 mg | DELAYED_RELEASE_TABLET | Freq: Every day | ORAL | Status: DC
  Administered 2014-11-10: 81 mg via ORAL
  Filled 2014-11-09: qty 1

## 2014-11-09 MED ORDER — AMLODIPINE BESYLATE 5 MG PO TABS
5.0000 mg | ORAL_TABLET | Freq: Every day | ORAL | Status: DC
  Administered 2014-11-10: 5 mg via ORAL
  Filled 2014-11-09: qty 1

## 2014-11-09 MED ORDER — ATORVASTATIN CALCIUM 10 MG PO TABS
10.0000 mg | ORAL_TABLET | Freq: Every day | ORAL | Status: DC
  Administered 2014-11-09 – 2014-11-10 (×2): 10 mg via ORAL
  Filled 2014-11-09 (×2): qty 1

## 2014-11-09 MED ORDER — FINASTERIDE 5 MG PO TABS
5.0000 mg | ORAL_TABLET | Freq: Every day | ORAL | Status: DC
  Administered 2014-11-10: 5 mg via ORAL
  Filled 2014-11-09: qty 1

## 2014-11-09 MED ORDER — POLYVINYL ALCOHOL 1.4 % OP SOLN
1.0000 [drp] | OPHTHALMIC | Status: DC | PRN
  Filled 2014-11-09: qty 15

## 2014-11-09 MED ORDER — SERTRALINE HCL 50 MG PO TABS
25.0000 mg | ORAL_TABLET | Freq: Every day | ORAL | Status: DC
  Administered 2014-11-09 – 2014-11-10 (×2): 25 mg via ORAL
  Filled 2014-11-09 (×2): qty 1

## 2014-11-09 MED ORDER — HYDROCHLOROTHIAZIDE 12.5 MG PO CAPS
12.5000 mg | ORAL_CAPSULE | Freq: Two times a day (BID) | ORAL | Status: DC
  Administered 2014-11-09 – 2014-11-10 (×3): 12.5 mg via ORAL
  Filled 2014-11-09 (×3): qty 1
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
## 2014-11-09 NOTE — ED Notes (Signed)
CN to follow up with EDP on home med orders.Total assist needed for toileting. Family at bedside.

## 2014-11-09 NOTE — ED Notes (Signed)
Pt sent over by [facility] for Medical Clearance.  Sts they are looking for a geri-psych bed.

## 2014-11-09 NOTE — ED Notes (Signed)
Nurse drawing labs. 

## 2014-11-09 NOTE — ED Notes (Signed)
Patient's daughter Evie Lacks [phone] would like to be updated. She needed to leave.

## 2014-11-09 NOTE — ED Notes (Addendum)
Family member reports pt's relatives visited 3 weeks ago and it caused the pt to have PTSD. Pt has been confused at home, asking about ex wife. Pt has not been sleeping, has had decreased appetite and repeats his questions. Pt disoriented to month, otherwise oriented. Family reports pt is wheelchair bound

## 2014-11-09 NOTE — ED Provider Notes (Signed)
CSN: [id number]     Arrival date & time 11/09/14  1151 History   First MD Initiated Contact with Patient 11/09/14 1204     Chief Complaint  Patient presents with  . Altered Mental Status      Patient is a 81 y.o. male presenting with altered mental status. The history is provided by a relative.  Altered Mental Status  Family member reports pt's relatives visited 3 weeks ago and it caused the pt to have PTSD. Pt has been confused at home, asking about ex wife. Pt has not been sleeping, has had decreased appetite and repeats his questions. Pt disoriented to month, otherwise oriented. Family reports pt is wheelchair bound.  Patient was sent over from behavioral health for medical clearance and placement. Past Medical History  Diagnosis Date  . Hypertension   . Osteoporosis   . GERD (gastroesophageal reflux disease)   . Neuropathy ([place])   . Skin cancer of nose   . Coronary artery disease   . High cholesterol   . Anginal pain ([place])   . Arthritis     "all over"  . Hyperthyroidism   . Depression ~ 80; ~ 1990   Past Surgical History  Procedure Laterality Date  . Cardiac catheterization  09/2007  . Coronary angioplasty with stent placement  05/08/2014    "?1"  . Back surgery    . Lumbar laminectomy/decompression microdiscectomy  2011  . Left heart catheterization with coronary angiogram N/A 05/08/2014    Procedure: LEFT HEART CATHETERIZATION WITH CORONARY ANGIOGRAM;  Surgeon: [name], MD; LAD 100% (old), CFX 20%, OM1 30%, pRCA 30%, dRCA 95%, EF 55%     . Cardiac catheterization N/A 05/08/2014    Procedure: CORONARY STENT INTERVENTION;  Surgeon: [name], MD; 3.518 mm Xience Alpine DES to the RCA    History reviewed. No pertinent family history. Social History  Substance Use Topics  . Smoking status: Former Smoker -- 1.00 packs/day for 20 years    Types: Cigarettes    Quit date: 01/12/1962  . Smokeless tobacco: Never Used  . Alcohol Use: No     Comment: 05/08/2014  "he's been in [place] for ~!35 yrs"    Review of Systems  Unable to perform ROS: Psychiatric disorder      Allergies  Cortisone  Home Medications   Prior to Admission medications   Medication Sig Start Date End Date Taking? Authorizing Provider  acetaminophen (TYLENOL) 500 MG tablet Take 1,000 mg by mouth every 6 (six) hours as needed (pain).   Yes Historical Provider, MD  amLODipine (NORVASC) 10 MG tablet Take 5 mg by mouth daily.    Yes Historical Provider, MD  atorvastatin (LIPITOR) 20 MG tablet Take 10 mg by mouth daily.   Yes Historical Provider, MD  calcium-vitamin D (OSCAL WITH D) 500-200 MG-UNIT per tablet Take 2 tablets by mouth daily with breakfast.   Yes Historical Provider, MD  cephALEXin (KEFLEX) 250 MG capsule Take 250 mg by mouth 3 (three) times daily. 11/06/14  Yes Historical Provider, MD  Cyanocobalamin (B-12 COMPLIANCE INJECTION IJ) Inject as directed every 30 (thirty) days.   Yes Historical Provider, MD  finasteride (PROSCAR) 5 MG tablet Take 5 mg by mouth daily.   Yes Historical Provider, MD  fish oil-omega-3 fatty acids 1000 MG capsule Take 2 g by mouth daily.   Yes Historical Provider, MD  hydroxypropyl methylcellulose / hypromellose (ISOPTO TEARS / GONIOVISC) 2.5 % ophthalmic solution Place 1 drop into both eyes as needed  for dry eyes.   Yes Historical Provider, MD  isosorbide mononitrate (IMDUR) 60 MG 24 hr tablet Take 1 tablet (60 mg total) by mouth every evening. 05/11/14  Yes Rhonda G Barrett, PA-C  lisinopril-hydrochlorothiazide (PRINZIDE,ZESTORETIC) 20-12.5 MG per tablet Take 1 tablet by mouth 2 (two) times daily.   Yes Historical Provider, MD  metoprolol (LOPRESSOR) 100 MG tablet Take 100 mg by mouth 2 (two) times daily.   Yes Historical Provider, MD  Naproxen Sod-Diphenhydramine (ALEVE PM PO) Take by mouth.   Yes Historical Provider, MD  pantoprazole (PROTONIX) 40 MG tablet Take 1 tablet (40 mg total) by mouth daily. 05/11/14  Yes Rhonda G Barrett, PA-C   polyvinyl alcohol (LIQUIFILM TEARS) 1.4 % ophthalmic solution Place 1 drop into both eyes 4 (four) times daily as needed for dry eyes.   Yes Historical Provider, MD  sertraline (ZOLOFT) 50 MG tablet Take 25 mg by mouth daily.   Yes Historical Provider, MD  tamsulosin (FLOMAX) 0.4 MG CAPS capsule Take 1 capsule (0.4 mg total) by mouth daily after supper. 04/10/13  Yes Nishant Dhungel, MD  aspirin 81 MG tablet Take 1 tablet (81 mg total) by mouth daily. 11/15/14   Mihai Croitoru, MD  clopidogrel (PLAVIX) 75 MG tablet Take 1 tablet (75 mg total) by mouth daily. 11/15/14   Mihai Croitoru, MD  levalbuterol (XOPENEX HFA) 45 MCG/ACT inhaler Inhale 2 puffs into the lungs every 6 (six) hours as needed for wheezing. He needs a spacer device as well Patient not taking: Reported on 11/03/2014 04/29/14   Leandrew Koyanagi, MD  lithium carbonate (LITHOBID) 300 MG CR tablet 1 tablet once a day for 2 days then twice a day until next evaluation Patient not taking: Reported on 11/09/2014 11/03/14   Leandrew Koyanagi, MD  traMADol (ULTRAM) 50 MG tablet Take 1 tablet (50 mg total) by mouth every 6 (six) hours as needed. Patient not taking: Reported on 07/17/2014 07/04/13   Pamella Pert, MD   BP 126/69 mmHg  Pulse 78  Temp(Src) 97.9 F (36.6 C) (Oral)  Resp 18  SpO2 94% Physical Exam  Constitutional: He appears well-developed and well-nourished. No distress.  HENT:  Head: Normocephalic and atraumatic.  Eyes: Pupils are equal, round, and reactive to light.  Neck: Normal range of motion.  Cardiovascular: Normal rate and intact distal pulses.   Pulmonary/Chest: No respiratory distress. He has no wheezes. He has no rales.  Abdominal: Normal appearance. He exhibits no distension. There is no tenderness. There is no rebound.  Musculoskeletal: Normal range of motion.  Neurological: He is alert. No cranial nerve deficit. GCS eye subscore is 4. GCS verbal subscore is 5. GCS motor subscore is 6.  Skin: Skin is warm  and dry. No rash noted.  Psychiatric: He has a normal mood and affect. His speech is normal and behavior is normal.  Nursing note and vitals reviewed.   ED Course  Procedures (including critical care time) Medications - No data to display  Labs Review   Results for orders placed or performed during the hospital encounter of 11/09/14  Urine culture  Result Value Ref Range   Specimen Description URINE, CLEAN CATCH    Special Requests NONE    Culture      70,000 COLONIES/ml MORGANELLA MORGANII 30,000 COLONIES/mL PSEUDOMONAS AERUGINOSA Performed at [facility]    Report Status 11/11/2014 FINAL    Organism ID, Bacteria PSEUDOMONAS AERUGINOSA    Organism ID, Bacteria MORGANELLA MORGANII  Susceptibility   Morganella morganii - MIC*    AMPICILLIN >=32 RESISTANT Resistant     CEFAZOLIN >=64 RESISTANT Resistant     CEFTRIAXONE <=1 SENSITIVE Sensitive     CIPROFLOXACIN <=0.25 SENSITIVE Sensitive     GENTAMICIN <=1 SENSITIVE Sensitive     IMIPENEM 1 SENSITIVE Sensitive     NITROFURANTOIN 64 RESISTANT Resistant     TRIMETH/SULFA <=20 SENSITIVE Sensitive     AMPICILLIN/SULBACTAM 16 INTERMEDIATE Intermediate     PIP/TAZO <=4 SENSITIVE Sensitive     * 70,000 COLONIES/ml MORGANELLA MORGANII   Pseudomonas aeruginosa - MIC*    CEFTAZIDIME 4 SENSITIVE Sensitive     CIPROFLOXACIN <=0.25 SENSITIVE Sensitive     GENTAMICIN <=1 SENSITIVE Sensitive     IMIPENEM 2 SENSITIVE Sensitive     PIP/TAZO 8 SENSITIVE Sensitive     CEFEPIME 2 SENSITIVE Sensitive     * 30,000 COLONIES/mL PSEUDOMONAS AERUGINOSA  Urinalysis, Routine w reflex microscopic (not at [facility])  Result Value Ref Range   Color, Urine YELLOW YELLOW   APPearance CLEAR CLEAR   Specific Gravity, Urine 1.012 1.005 - 1.030   pH 6.5 5.0 - 8.0   Glucose, UA NEGATIVE NEGATIVE mg/dL   Hgb urine dipstick NEGATIVE NEGATIVE   Bilirubin Urine NEGATIVE NEGATIVE   Ketones, ur NEGATIVE NEGATIVE mg/dL   Protein, ur NEGATIVE NEGATIVE  mg/dL   Urobilinogen, UA 0.2 0.0 - 1.0 mg/dL   Nitrite NEGATIVE NEGATIVE   Leukocytes, UA TRACE (A) NEGATIVE  Comprehensive metabolic panel  Result Value Ref Range   Sodium 138 135 - 145 mmol/L   Potassium 4.0 3.5 - 5.1 mmol/L   Chloride 103 101 - 111 mmol/L   CO2 28 22 - 32 mmol/L   Glucose, Bld 99 65 - 99 mg/dL   BUN 24 (H) 6 - 20 mg/dL   Creatinine, Ser 1.11 0.61 - 1.24 mg/dL   Calcium 9.6 8.9 - 10.3 mg/dL   Total Protein 7.1 6.5 - 8.1 g/dL   Albumin 3.8 3.5 - 5.0 g/dL   AST 17 15 - 41 U/L   ALT 14 (L) 17 - 63 U/L   Alkaline Phosphatase 75 38 - 126 U/L   Total Bilirubin 0.7 0.3 - 1.2 mg/dL   GFR calc non Af Amer 58 (L) >60 mL/min   GFR calc Af Amer >60 >60 mL/min   Anion gap 7 5 - 15  CBC with Differential/Platelet  Result Value Ref Range   WBC 6.8 4.0 - 10.5 K/uL   RBC 4.95 4.22 - 5.81 MIL/uL   Hemoglobin 15.1 13.0 - 17.0 g/dL   HCT 45.0 39.0 - 52.0 %   MCV 90.9 78.0 - 100.0 fL   MCH 30.5 26.0 - 34.0 pg   MCHC 33.6 30.0 - 36.0 g/dL   RDW 14.3 11.5 - 15.5 %   Platelets 240 150 - 400 K/uL   Neutrophils Relative % 72 %   Neutro Abs 4.9 1.7 - 7.7 K/uL   Lymphocytes Relative 17 %   Lymphs Abs 1.2 0.7 - 4.0 K/uL   Monocytes Relative 8 %   Monocytes Absolute 0.5 0.1 - 1.0 K/uL   Eosinophils Relative 3 %   Eosinophils Absolute 0.2 0.0 - 0.7 K/uL   Basophils Relative 0 %   Basophils Absolute 0.0 0.0 - 0.1 K/uL  Urine microscopic-add on  Result Value Ref Range   WBC, UA 3-6 <3 WBC/hpf   RBC / HPF 0-2 <3 RBC/hpf   Dg Chest 2 View  11/09/2014  CLINICAL DATA:  And altered level of consciousness, hypertension, coronary artery disease, confusion, not been sleeping, decreased appetite EXAM: CHEST  2 VIEW COMPARISON:  04/29/2014 ; correlation CT chest 05/01/2014 FINDINGS: Enlargement of cardiac silhouette. Atherosclerotic calcification and mild tortuosity of thoracic aorta. Hila less prominent. Bibasilar atelectasis. Prominent LEFT superior mediastinal soft tissues likely  related to tortuous innominate artery as identified on prior CT, unchanged. Remaining lungs clear. No pleural effusion or pneumothorax. Bones demineralized. IMPRESSION: Enlargement of cardiac silhouette with bibasilar atelectasis. Electronically Signed   By: Lavonia Dana M.D.   On: 11/09/2014 14:18   Ct Head Wo Contrast  11/09/2014  CLINICAL DATA:  Confusion. Altered mental status. Disoriented. PTSD. EXAM: CT HEAD WITHOUT CONTRAST TECHNIQUE: Contiguous axial images were obtained from the base of the skull through the vertex without intravenous contrast. COMPARISON:  Head CT 03/31/2013 FINDINGS: No acute intracranial hemorrhage. No focal mass lesion. No CT evidence of acute infarction. No midline shift or mass effect. No hydrocephalus. Basilar cisterns are patent. Generalized cortical atrophy. Periventricular white matter hypodensities. Subcortical white matter hypodensities additionally. Paranasal sinuses and  mastoid air cells are clear. IMPRESSION: 1. No acute intracranial findings. 2. Chronic atrophy and white matter microvascular disease. Electronically Signed   By: Suzy Bouchard M.D.   On: 11/09/2014 13:58        MDM   Final diagnoses:  Altered mental status, unspecified altered mental status type        Leonard Schwartz, MD 11/16/14 2240

## 2014-11-09 NOTE — ED Notes (Signed)
Pt given a urinal and knows we need a sample. Pt said she would call when he has a sample ready.

## 2014-11-09 NOTE — ED Notes (Signed)
Patient transported to CT 

## 2014-11-09 NOTE — BH Assessment (Signed)
[place] Assessment Progress Note  The following facilities have been contacted to seek placement for this pt, with results as noted:  Beds available, information sent, decision pending:  [facility]  At capacity:  [facility] [place] Triage Specialist [phone]

## 2014-11-09 NOTE — ED Notes (Signed)
Patient aware that urine sample is needed. Will call when ready.

## 2014-11-10 DIAGNOSIS — F0281 Dementia in other diseases classified elsewhere with behavioral disturbance: Secondary | ICD-10-CM | POA: Diagnosis not present

## 2014-11-10 DIAGNOSIS — H9193 Unspecified hearing loss, bilateral: Secondary | ICD-10-CM | POA: Diagnosis not present

## 2014-11-10 DIAGNOSIS — Z87891 Personal history of nicotine dependence: Secondary | ICD-10-CM | POA: Diagnosis not present

## 2014-11-10 DIAGNOSIS — F99 Mental disorder, not otherwise specified: Secondary | ICD-10-CM | POA: Diagnosis not present

## 2014-11-10 DIAGNOSIS — N400 Enlarged prostate without lower urinary tract symptoms: Secondary | ICD-10-CM | POA: Diagnosis not present

## 2014-11-10 DIAGNOSIS — F4310 Post-traumatic stress disorder, unspecified: Secondary | ICD-10-CM

## 2014-11-10 DIAGNOSIS — C801 Malignant (primary) neoplasm, unspecified: Secondary | ICD-10-CM | POA: Diagnosis not present

## 2014-11-10 DIAGNOSIS — R1310 Dysphagia, unspecified: Secondary | ICD-10-CM | POA: Diagnosis not present

## 2014-11-10 DIAGNOSIS — M1990 Unspecified osteoarthritis, unspecified site: Secondary | ICD-10-CM | POA: Diagnosis not present

## 2014-11-10 DIAGNOSIS — R4182 Altered mental status, unspecified: Secondary | ICD-10-CM | POA: Diagnosis not present

## 2014-11-10 DIAGNOSIS — G309 Alzheimer's disease, unspecified: Secondary | ICD-10-CM | POA: Diagnosis not present

## 2014-11-10 DIAGNOSIS — N183 Chronic kidney disease, stage 3 (moderate): Secondary | ICD-10-CM | POA: Diagnosis present

## 2014-11-10 DIAGNOSIS — I25118 Atherosclerotic heart disease of native coronary artery with other forms of angina pectoris: Secondary | ICD-10-CM | POA: Diagnosis not present

## 2014-11-10 DIAGNOSIS — I10 Essential (primary) hypertension: Secondary | ICD-10-CM | POA: Diagnosis not present

## 2014-11-10 DIAGNOSIS — E785 Hyperlipidemia, unspecified: Secondary | ICD-10-CM | POA: Diagnosis not present

## 2014-11-10 DIAGNOSIS — H9190 Unspecified hearing loss, unspecified ear: Secondary | ICD-10-CM | POA: Diagnosis not present

## 2014-11-10 DIAGNOSIS — R279 Unspecified lack of coordination: Secondary | ICD-10-CM | POA: Diagnosis not present

## 2014-11-10 DIAGNOSIS — K219 Gastro-esophageal reflux disease without esophagitis: Secondary | ICD-10-CM | POA: Diagnosis not present

## 2014-11-10 DIAGNOSIS — I129 Hypertensive chronic kidney disease with stage 1 through stage 4 chronic kidney disease, or unspecified chronic kidney disease: Secondary | ICD-10-CM | POA: Diagnosis present

## 2014-11-10 DIAGNOSIS — N390 Urinary tract infection, site not specified: Secondary | ICD-10-CM | POA: Diagnosis present

## 2014-11-10 DIAGNOSIS — M6281 Muscle weakness (generalized): Secondary | ICD-10-CM | POA: Diagnosis not present

## 2014-11-10 DIAGNOSIS — F319 Bipolar disorder, unspecified: Secondary | ICD-10-CM | POA: Diagnosis present

## 2014-11-10 DIAGNOSIS — Z79899 Other long term (current) drug therapy: Secondary | ICD-10-CM | POA: Diagnosis not present

## 2014-11-10 DIAGNOSIS — G629 Polyneuropathy, unspecified: Secondary | ICD-10-CM | POA: Diagnosis not present

## 2014-11-10 DIAGNOSIS — R296 Repeated falls: Secondary | ICD-10-CM | POA: Diagnosis not present

## 2014-11-10 DIAGNOSIS — E079 Disorder of thyroid, unspecified: Secondary | ICD-10-CM | POA: Diagnosis not present

## 2014-11-10 DIAGNOSIS — I2510 Atherosclerotic heart disease of native coronary artery without angina pectoris: Secondary | ICD-10-CM | POA: Diagnosis not present

## 2014-11-10 MED ORDER — CEPHALEXIN 250 MG PO CAPS
250.0000 mg | ORAL_CAPSULE | Freq: Three times a day (TID) | ORAL | Status: DC
  Administered 2014-11-10 (×2): 250 mg via ORAL
  Filled 2014-11-10 (×2): qty 1

## 2014-11-10 NOTE — Consult Note (Signed)
[place] Psychiatry Consult   Reason for Consult:  Change in mental status  Referring Physician:  ED physician Patient Identification: [name] [name] MRN:  [id number] Principal Diagnosis:  Mental Status Changes  Diagnosis:   Patient Active Problem List   Diagnosis Date Noted  . Vomiting and diarrhea [R11.10, R19.7] 11/09/2014  . Unstable angina ([place]) [I20.0] 05/10/2014  . Coronary artery disease due to lipid rich plaque [I25.10]   . Angina pectoris, crescendo ([place]) [I20.0] 04/28/2014  . Contusion of left hip [S70.02XA] 07/04/2013  . Fall [W19.XXXA] 07/04/2013  . BPH (benign prostatic hypertrophy) [N40.0] 05/31/2013  . CAD (coronary artery disease) [I25.10] 05/31/2013  . UTI (urinary tract infection) [N39.0] 05/31/2013  . Hyperlipidemia [E78.5] 05/31/2013  . Dysphagia, pharyngoesophageal phase [R13.14] 04/10/2013  . Frequent falls [R29.6] 04/10/2013  . Weakness [R53.1] 04/10/2013  . Hyperthyroidism, subclinical [E05.90] 04/10/2013  . GERD (gastroesophageal reflux disease) [K21.9] 04/06/2013  . Bladder outflow obstruction [N32.0] 04/06/2013  . Dehydration [E86.0] 04/05/2013  . Hypertension [I10] 04/05/2013  . Arthritis [M19.90] 04/05/2013  . Peripheral neuropathy ([place]) [G62.9] 04/05/2013  . Gastroenteritis, acute [K52.9] 04/05/2013  . Gastroenteritis [K52.9] 04/05/2013    Total Time spent with patient: 30 minutes  Subjective:   [name] [name] is a 81 y.o. male patient admitted with changes in mental status .  HPI:  81 year old man, seen at his request along with his adult daughter, who provided collateral information. Report is that patient was in usual state of health up to several days ago, when he presented with a relatively rapid deterioration of mental status, appearing confused, confusing daughter for wife, and also behaving strangely such as staring blankly and presenting with some increased irritability. Patient has a history of PTSD symptoms related to growing up  In  [place] and in [place] during [employer], having been in [place] and in [place] , and lost several family members during the war . Overall it appears that patient has some chronic symptoms of PTSD such as nightmares, memories, but that it does not significantly disrupt his life.  He has had prior psychiatric admissions , and has been diagnosed with  Bipolar Disorder in the past - has been on Lithium but was not taking it, has also been prescribed low dose of Zoloft .  Recently one of his sisters, who lives in [place], and a brother, who lives in [place], have come to visit him, and family member feels that patient's PTSD may have been exacerbated by these triggers . Another issue that may be contributing, particularly to his confusion, is that he had  A recent UTI. At this time patient is pleasant, cooperative, oriented x 3, not exhibiting any delirium, except for some slowing  Of thought process. Some latency of speech noted, and speaks slowly. He does endorse some PTSD symptoms- mostly intrusive memories, thinking about people who died, and some nightmares .  Of note, currently he does not endorse severe depression or neuro-vegetative symptoms of depression. Patient does not endorse any alcohol or drug abuse. Of note, he mobilizes in wheel chair , due to history of severe peripheral neuropathy. Both patient and his daughter indicate that , while he seems to be somewhat better today, he is not at baseline, and they are both interested in a geropsychiatric unit admission for ongoing monitoring and treatment .    Past Psychiatric History:  PTSD as above   Risk to Self: Is patient at risk for suicide?: No Risk to Others:   Prior Inpatient  Therapy:   Prior Outpatient Therapy:    Past Medical History:  Past Medical History  Diagnosis Date  . Hypertension   . Osteoporosis   . GERD (gastroesophageal reflux disease)   . Neuropathy ([place])   . Skin cancer of nose   . Coronary artery disease   .  High cholesterol   . Anginal pain ([place])   . Arthritis     "all over"  . Hyperthyroidism   . Depression ~ 86; ~ 1990    Past Surgical History  Procedure Laterality Date  . Cardiac catheterization  09/2007  . Coronary angioplasty with stent placement  05/08/2014    "?1"  . Back surgery    . Lumbar laminectomy/decompression microdiscectomy  2011  . Left heart catheterization with coronary angiogram N/A 05/08/2014    Procedure: LEFT HEART CATHETERIZATION WITH CORONARY ANGIOGRAM;  Surgeon: [name], MD; LAD 100% (old), CFX 20%, OM1 30%, pRCA 30%, dRCA 95%, EF 55%     . Cardiac catheterization N/A 05/08/2014    Procedure: CORONARY STENT INTERVENTION;  Surgeon: [name], MD; 3.518 mm Xience Alpine DES to the RCA    Family History: History reviewed. No pertinent family history. Family Psychiatric  History:  None endorsed  Social History:  History  Alcohol Use No    Comment: 05/08/2014 "he's been in [place] for ~!35 yrs"     History  Drug Use No    Social History   Social History  . Marital Status: Divorced    Spouse Name: N/A  . Number of Children: N/A  . Years of Education: N/A   Social History Main Topics  . Smoking status: Former Smoker -- 1.00 packs/day for 20 years    Types: Cigarettes    Quit date: 01/12/1962  . Smokeless tobacco: Never Used  . Alcohol Use: No     Comment: 05/08/2014 "he's been in [place] for ~!35 yrs"  . Drug Use: No  . Sexual Activity: Not Asked   Other Topics Concern  . None   Social History Narrative   Additional Social History:   Allergies:   Allergies  Allergen Reactions  . Cortisone     Causes emotional problems     Labs:  Results for orders placed or performed during the hospital encounter of 11/09/14 (from the past 48 hour(s))  Comprehensive metabolic panel     Status: Abnormal   Collection Time: 11/09/14  1:46 PM  Result Value Ref Range   Sodium 138 135 - 145 mmol/L   Potassium 4.0 3.5 - 5.1 mmol/L   Chloride 103 101 - 111  mmol/L   CO2 28 22 - 32 mmol/L   Glucose, Bld 99 65 - 99 mg/dL   BUN 24 (H) 6 - 20 mg/dL   Creatinine, Ser 1.11 0.61 - 1.24 mg/dL   Calcium 9.6 8.9 - 10.3 mg/dL   Total Protein 7.1 6.5 - 8.1 g/dL   Albumin 3.8 3.5 - 5.0 g/dL   AST 17 15 - 41 U/L   ALT 14 (L) 17 - 63 U/L   Alkaline Phosphatase 75 38 - 126 U/L   Total Bilirubin 0.7 0.3 - 1.2 mg/dL   GFR calc non Af Amer 58 (L) >60 mL/min   GFR calc Af Amer >60 >60 mL/min    Comment: (NOTE) The eGFR has been calculated using the CKD EPI equation. This calculation has not been validated in all clinical situations. eGFR's persistently <60 mL/min signify possible Chronic Kidney Disease.    Anion  gap 7 5 - 15  CBC with Differential/Platelet     Status: None   Collection Time: 11/09/14  1:46 PM  Result Value Ref Range   WBC 6.8 4.0 - 10.5 K/uL   RBC 4.95 4.22 - 5.81 MIL/uL   Hemoglobin 15.1 13.0 - 17.0 g/dL   HCT 45.0 39.0 - 52.0 %   MCV 90.9 78.0 - 100.0 fL   MCH 30.5 26.0 - 34.0 pg   MCHC 33.6 30.0 - 36.0 g/dL   RDW 14.3 11.5 - 15.5 %   Platelets 240 150 - 400 K/uL   Neutrophils Relative % 72 %   Neutro Abs 4.9 1.7 - 7.7 K/uL   Lymphocytes Relative 17 %   Lymphs Abs 1.2 0.7 - 4.0 K/uL   Monocytes Relative 8 %   Monocytes Absolute 0.5 0.1 - 1.0 K/uL   Eosinophils Relative 3 %   Eosinophils Absolute 0.2 0.0 - 0.7 K/uL   Basophils Relative 0 %   Basophils Absolute 0.0 0.0 - 0.1 K/uL  Urinalysis, Routine w reflex microscopic (not at [facility])     Status: Abnormal   Collection Time: 11/09/14  3:32 PM  Result Value Ref Range   Color, Urine YELLOW YELLOW   APPearance CLEAR CLEAR   Specific Gravity, Urine 1.012 1.005 - 1.030   pH 6.5 5.0 - 8.0   Glucose, UA NEGATIVE NEGATIVE mg/dL   Hgb urine dipstick NEGATIVE NEGATIVE   Bilirubin Urine NEGATIVE NEGATIVE   Ketones, ur NEGATIVE NEGATIVE mg/dL   Protein, ur NEGATIVE NEGATIVE mg/dL   Urobilinogen, UA 0.2 0.0 - 1.0 mg/dL   Nitrite NEGATIVE NEGATIVE   Leukocytes, UA TRACE (A)  NEGATIVE  Urine microscopic-add on     Status: None   Collection Time: 11/09/14  3:32 PM  Result Value Ref Range   WBC, UA 3-6 <3 WBC/hpf   RBC / HPF 0-2 <3 RBC/hpf    Current Facility-Administered Medications  Medication Dose Route Frequency Provider Last Rate Last Dose  . acetaminophen (TYLENOL) tablet 1,000 mg  1,000 mg Oral Q6H PRN Daleen Bo, MD      . alum & mag hydroxide-simeth (MAALOX/MYLANTA) 200-200-20 MG/5ML suspension 30 mL  30 mL Oral PRN Daleen Bo, MD      . amLODipine (NORVASC) tablet 5 mg  5 mg Oral Daily Daleen Bo, MD   5 mg at 11/10/14 0943  . aspirin EC tablet 81 mg  81 mg Oral Daily Daleen Bo, MD   81 mg at 11/10/14 0943  . atorvastatin (LIPITOR) tablet 10 mg  10 mg Oral Daily Daleen Bo, MD   10 mg at 11/10/14 0943  . calcium-vitamin D (OSCAL WITH D) 500-200 MG-UNIT per tablet 2 tablet  2 tablet Oral Q breakfast Daleen Bo, MD   2 tablet at 11/10/14 [phone]  . cephALEXin (KEFLEX) capsule 250 mg  250 mg Oral TID Lurena Nida, NP      . clopidogrel (PLAVIX) tablet 75 mg  75 mg Oral Daily Daleen Bo, MD   75 mg at 11/10/14 0943  . finasteride (PROSCAR) tablet 5 mg  5 mg Oral Daily Daleen Bo, MD   5 mg at 11/10/14 0948  . fish oil-omega-3 fatty acids capsule 2 g  2 g Oral Daily Daleen Bo, MD   2 g at 11/10/14 1125  . lisinopril (PRINIVIL,ZESTRIL) tablet 20 mg  20 mg Oral BID Leonard Schwartz, MD   20 mg at 11/10/14 0943   And  . hydrochlorothiazide (MICROZIDE) capsule 12.5 mg  12.5 mg Oral BID Leonard Schwartz, MD   12.5 mg at 11/10/14 1051  . ibuprofen (ADVIL,MOTRIN) tablet 600 mg  600 mg Oral Q8H PRN Daleen Bo, MD      . isosorbide mononitrate (IMDUR) 24 hr tablet 60 mg  60 mg Oral QPM Daleen Bo, MD   60 mg at 11/09/14 2038  . metoprolol tartrate (LOPRESSOR) tablet 100 mg  100 mg Oral BID Daleen Bo, MD   100 mg at 11/10/14 1051  . ondansetron (ZOFRAN) tablet 4 mg  4 mg Oral Q8H PRN Daleen Bo, MD      . pantoprazole (PROTONIX) EC  tablet 40 mg  40 mg Oral Daily Daleen Bo, MD   40 mg at 11/10/14 1051  . polyvinyl alcohol (LIQUIFILM TEARS) 1.4 % ophthalmic solution 1 drop  1 drop Both Eyes PRN Leonard Schwartz, MD      . sertraline (ZOLOFT) tablet 25 mg  25 mg Oral Daily Daleen Bo, MD   25 mg at 11/09/14 2037  . tamsulosin (FLOMAX) capsule 0.4 mg  0.4 mg Oral QPC supper Daleen Bo, MD   0.4 mg at 11/09/14 2038   Current Outpatient Prescriptions  Medication Sig Dispense Refill  . acetaminophen (TYLENOL) 500 MG tablet Take 1,000 mg by mouth every 6 (six) hours as needed (pain).    [name] amLODipine (NORVASC) 10 MG tablet Take 5 mg by mouth daily.     [name] aspirin 81 MG tablet Take 81 mg by mouth daily.    [name] atorvastatin (LIPITOR) 20 MG tablet Take 10 mg by mouth daily.    . calcium-vitamin D (OSCAL WITH D) 500-200 MG-UNIT per tablet Take 2 tablets by mouth daily with breakfast.    . cephALEXin (KEFLEX) 250 MG capsule Take 250 mg by mouth 3 (three) times daily.    . clopidogrel (PLAVIX) 75 MG tablet Take 1 tablet (75 mg total) by mouth daily. 30 tablet 11  . Cyanocobalamin (B-12 COMPLIANCE INJECTION IJ) Inject as directed every 30 (thirty) days.    . finasteride (PROSCAR) 5 MG tablet Take 5 mg by mouth daily.    . fish oil-omega-3 fatty acids 1000 MG capsule Take 2 g by mouth daily.    . hydroxypropyl methylcellulose / hypromellose (ISOPTO TEARS / GONIOVISC) 2.5 % ophthalmic solution Place 1 drop into both eyes as needed for dry eyes.    . isosorbide mononitrate (IMDUR) 60 MG 24 hr tablet Take 1 tablet (60 mg total) by mouth every evening. 30 tablet 11  . lisinopril-hydrochlorothiazide (PRINZIDE,ZESTORETIC) 20-12.5 MG per tablet Take 1 tablet by mouth 2 (two) times daily.    . metoprolol (LOPRESSOR) 100 MG tablet Take 100 mg by mouth 2 (two) times daily.    . Naproxen Sod-Diphenhydramine (ALEVE PM PO) Take by mouth.    . pantoprazole (PROTONIX) 40 MG tablet Take 1 tablet (40 mg total) by mouth daily. 30 tablet 11  .  polyvinyl alcohol (LIQUIFILM TEARS) 1.4 % ophthalmic solution Place 1 drop into both eyes 4 (four) times daily as needed for dry eyes.    [name] sertraline (ZOLOFT) 50 MG tablet Take 25 mg by mouth daily.    . tamsulosin (FLOMAX) 0.4 MG CAPS capsule Take 1 capsule (0.4 mg total) by mouth daily after supper. 30 capsule 0  . levalbuterol (XOPENEX HFA) 45 MCG/ACT inhaler Inhale 2 puffs into the lungs every 6 (six) hours as needed for wheezing. He needs a spacer device as well (Patient not taking: Reported on 11/03/2014) 1 Inhaler 12  .  lithium carbonate (LITHOBID) 300 MG CR tablet 1 tablet once a day for 2 days then twice a day until next evaluation (Patient not taking: Reported on 11/09/2014) 60 tablet 0  . traMADol (ULTRAM) 50 MG tablet Take 1 tablet (50 mg total) by mouth every 6 (six) hours as needed. (Patient not taking: Reported on 07/17/2014) 25 tablet 0    Musculoskeletal: Strength & Muscle Tone: within normal limits Gait & Station: gait not examined- patient reportedly mobilizes in wheel chair  Patient leans: N/A  Psychiatric Specialty Exam: ROS at this time denies headache, denies chest pain, no shortness of breath, denies dysuria . Denies feeling fevers or chills .   Blood pressure 139/76, pulse 66, temperature 98.1 F (36.7 C), temperature source Oral, resp. rate 18, SpO2 97 %.There is no weight on file to calculate BMI.  General Appearance: Well Groomed  Eye Contact::  Good  Speech:  Slow  Volume:  Decreased  Mood:   At this time describes mood as "OK", minimizes depression  Affect:   Constricted  But reactive   Thought Process:  Goal Directed and Linear  Orientation:  Full (Time, Place, and Person)  Thought Content:  denies hallucinations, no delusions expressed   Suicidal Thoughts:  No at this time denies any suicidal ideations or self injurious ideations  Homicidal Thoughts:  No  Memory:  recall 3/3 immediate, 2/3 at 3 minutes, able to perform 4/5 serial subtractions .  Currently  oriented x 3 .   Judgement:  Fair  Insight:  Present  Psychomotor Activity:  Decreased  Concentration:  Fair  Recall:  [employer] of Knowledge:Good  Language: Fair  Akathisia:  No  Handed:  Right  AIMS (if indicated):     Assets:  Desire for Improvement Resilience Social Support  ADL's:   Fair   Cognition:  At this time alert, attentive, oriented x 3.   Sleep:      Treatment Plan Summary: Patient and daughter both interested in voluntary inpatient admission to gero-psychiatry unit for ongoing treatment.   Disposition: Recommend psychiatric Inpatient admission when medically cleared.  Treatment team working on disposition options.   [name] [name], [place] 11/10/2014 12:15 PM

## 2014-11-10 NOTE — BH Assessment (Signed)
Patient has been accepted to [facility] per [place] by Dr. Jeanette Caprice. Patient can come after 7pm. Call nurse report to [phone]. Informed patients nurses of acceptance.   [name] Hawking, LCSW Therapeutic Triage Specialist [place] 11/10/2014 5:52 PM

## 2014-11-10 NOTE — Progress Notes (Addendum)
Pt6 is lying in the bed. He remains alert - Pts daughter has been with him most of the day. Pt has eaten 100% of his breakfast and lunch. Pt also was assisted with a shower. He is a two person assist due to unsteadiness on his feet. Pt was up in the cardiac chair. Pt has to be prompted to eat. Phoned Mikel Cella to give report to [employer]. Phoned [place] at 6:45p-Phoned the pts family concernign the pt being transferred to [facility].

## 2014-11-10 NOTE — ED Notes (Signed)
[facility] picked patient up. Patient gone to facility.

## 2014-11-11 LAB — URINE CULTURE: Culture: 70000

## 2014-11-12 ENCOUNTER — Telehealth (HOSPITAL_COMMUNITY): Payer: Self-pay

## 2014-11-12 NOTE — Telephone Encounter (Signed)
Post ED Visit - Positive Culture Follow-up: Chart Hand-off to ED Flow Manager  Culture assessed and recommendations reviewed by: []  Levester Fresh, Pharm.D., BCPS []  Heide Guile, Pharm.D., BCPS-AQ ID []  Alycia Rossetti, Pharm.D., BCPS []  [place], [place].D., BCPS, AAHIVP [x]  Legrand Como, Pharm .D., BCPS, AAHIVP []  Milus Glazier, Pharm.D. []  Dimitri Ped, Pharm.D.  Positive urine culture  []  Patient discharged without antimicrobial prescription and treatment is now indicated [x]  Organism is resistant to prescribed ED discharge antimicrobial []  Patient with positive blood cultures  Culture results faxed to [place] unit at [place]  Ileene Musa 11/12/2014, 11:24 AM

## 2014-11-13 DIAGNOSIS — I25118 Atherosclerotic heart disease of native coronary artery with other forms of angina pectoris: Secondary | ICD-10-CM | POA: Diagnosis not present

## 2014-11-13 DIAGNOSIS — G4700 Insomnia, unspecified: Secondary | ICD-10-CM | POA: Diagnosis not present

## 2014-11-13 DIAGNOSIS — F0281 Dementia in other diseases classified elsewhere with behavioral disturbance: Secondary | ICD-10-CM | POA: Diagnosis not present

## 2014-11-13 DIAGNOSIS — I10 Essential (primary) hypertension: Secondary | ICD-10-CM | POA: Diagnosis not present

## 2014-11-13 DIAGNOSIS — F319 Bipolar disorder, unspecified: Secondary | ICD-10-CM | POA: Diagnosis not present

## 2014-11-13 DIAGNOSIS — F4310 Post-traumatic stress disorder, unspecified: Secondary | ICD-10-CM | POA: Diagnosis not present

## 2014-11-13 DIAGNOSIS — E782 Mixed hyperlipidemia: Secondary | ICD-10-CM | POA: Diagnosis not present

## 2014-11-13 DIAGNOSIS — R4182 Altered mental status, unspecified: Secondary | ICD-10-CM | POA: Diagnosis not present

## 2014-11-13 DIAGNOSIS — F329 Major depressive disorder, single episode, unspecified: Secondary | ICD-10-CM | POA: Diagnosis not present

## 2014-11-13 DIAGNOSIS — H9190 Unspecified hearing loss, unspecified ear: Secondary | ICD-10-CM | POA: Diagnosis not present

## 2014-11-13 DIAGNOSIS — N189 Chronic kidney disease, unspecified: Secondary | ICD-10-CM | POA: Diagnosis not present

## 2014-11-13 DIAGNOSIS — E785 Hyperlipidemia, unspecified: Secondary | ICD-10-CM | POA: Diagnosis not present

## 2014-11-13 DIAGNOSIS — N400 Enlarged prostate without lower urinary tract symptoms: Secondary | ICD-10-CM | POA: Diagnosis not present

## 2014-11-13 DIAGNOSIS — R296 Repeated falls: Secondary | ICD-10-CM | POA: Diagnosis not present

## 2014-11-13 DIAGNOSIS — K219 Gastro-esophageal reflux disease without esophagitis: Secondary | ICD-10-CM | POA: Diagnosis not present

## 2014-11-13 DIAGNOSIS — M1990 Unspecified osteoarthritis, unspecified site: Secondary | ICD-10-CM | POA: Diagnosis not present

## 2014-11-13 DIAGNOSIS — E079 Disorder of thyroid, unspecified: Secondary | ICD-10-CM | POA: Diagnosis not present

## 2014-11-13 DIAGNOSIS — C801 Malignant (primary) neoplasm, unspecified: Secondary | ICD-10-CM | POA: Diagnosis not present

## 2014-11-13 DIAGNOSIS — M6281 Muscle weakness (generalized): Secondary | ICD-10-CM | POA: Diagnosis not present

## 2014-11-13 DIAGNOSIS — I2510 Atherosclerotic heart disease of native coronary artery without angina pectoris: Secondary | ICD-10-CM | POA: Diagnosis not present

## 2014-11-13 DIAGNOSIS — G309 Alzheimer's disease, unspecified: Secondary | ICD-10-CM | POA: Diagnosis not present

## 2014-11-13 DIAGNOSIS — G629 Polyneuropathy, unspecified: Secondary | ICD-10-CM | POA: Diagnosis not present

## 2014-11-13 DIAGNOSIS — R279 Unspecified lack of coordination: Secondary | ICD-10-CM | POA: Diagnosis not present

## 2014-11-13 DIAGNOSIS — R1310 Dysphagia, unspecified: Secondary | ICD-10-CM | POA: Diagnosis not present

## 2014-11-13 DIAGNOSIS — N183 Chronic kidney disease, stage 3 (moderate): Secondary | ICD-10-CM | POA: Diagnosis not present

## 2014-11-15 ENCOUNTER — Telehealth: Payer: Self-pay | Admitting: Cardiovascular Disease

## 2014-11-15 MED ORDER — ASPIRIN 81 MG PO TABS: 81.0000 mg | ORAL_TABLET | Freq: Every day | ORAL | Status: DC

## 2014-11-15 MED ORDER — CLOPIDOGREL BISULFATE 75 MG PO TABS: 75.0000 mg | ORAL_TABLET | Freq: Every day | ORAL | Status: DC

## 2014-11-15 NOTE — Telephone Encounter (Signed)
[name] states her father has had a sudden altered mental state and has been in several facilities over the past week.  [facility] Sertral[facility] HCL 25 mg.  Dr. [name] stated since he is on this medication he will need to have a platelet Inhibition P2Y12 test nest week before 11/22/14.  Patient is now in a rehab facility in [place] was told that [facility] the only place that does this lab test.  Is there anywhere in [facility] that can draw this lab work?   Also, the patient has not been getting his Plavix or ASA and [name] is very concerned because the patient does have a stent.   Please call [phone] before 2:30 pm--if she does not answer, please have her paged.  After 2:30pm call her cell [phone].

## 2014-11-15 NOTE — Telephone Encounter (Signed)
Spoke with [name], the pt is currently at [facility] in [place]. She is concerned because he had a stent placed in April and he is not getting his plavix or aspirin. They need an order faxed to them @ [phone]. She is also wondering where to take the pt for the p2y12 blood work. order faxed for plavix and aspirin to the number provided.

## 2014-11-15 NOTE — Telephone Encounter (Signed)
Left message for myra, scripts have been faxed to the facility for the pt. The pt is due to see dr croitoru in November. Doing the lab work next week is too soon if he has been off the plavix. Ask her to call and make an appt

## 2014-11-16 DIAGNOSIS — N400 Enlarged prostate without lower urinary tract symptoms: Secondary | ICD-10-CM | POA: Diagnosis not present

## 2014-11-16 DIAGNOSIS — K219 Gastro-esophageal reflux disease without esophagitis: Secondary | ICD-10-CM | POA: Diagnosis not present

## 2014-11-16 DIAGNOSIS — I10 Essential (primary) hypertension: Secondary | ICD-10-CM | POA: Diagnosis not present

## 2014-11-16 DIAGNOSIS — E782 Mixed hyperlipidemia: Secondary | ICD-10-CM | POA: Diagnosis not present

## 2014-11-27 ENCOUNTER — Other Ambulatory Visit: Payer: Self-pay | Admitting: Internal Medicine

## 2014-11-27 ENCOUNTER — Telehealth: Payer: Self-pay | Admitting: Cardiovascular Disease

## 2014-11-27 NOTE — Telephone Encounter (Signed)
Pt's daughter called in wanting to know when Dr.C wanted the pt to have his labs to have his platelets checked. Please f/u with her. She stated that if you call her on her work phone to please have the operator to page her.   Thanks

## 2014-11-27 NOTE — Telephone Encounter (Signed)
Left message for [name] [name], the pt will need to be on the plavix for 2 weeks prior to having lab work drawn. She will call back with further questions.

## 2014-12-11 ENCOUNTER — Telehealth: Payer: Self-pay | Admitting: Cardiovascular Disease

## 2014-12-11 DIAGNOSIS — E782 Mixed hyperlipidemia: Secondary | ICD-10-CM | POA: Diagnosis not present

## 2014-12-11 DIAGNOSIS — N400 Enlarged prostate without lower urinary tract symptoms: Secondary | ICD-10-CM | POA: Diagnosis not present

## 2014-12-11 DIAGNOSIS — I10 Essential (primary) hypertension: Secondary | ICD-10-CM | POA: Diagnosis not present

## 2014-12-11 DIAGNOSIS — K219 Gastro-esophageal reflux disease without esophagitis: Secondary | ICD-10-CM | POA: Diagnosis not present

## 2014-12-11 NOTE — Telephone Encounter (Signed)
Pt's daughter called in wanting to inform Dr. Loletha Grayer that the pt is still in [facility] and he will be having a platelet inhibitor test done at [facility]. Once those results have been received, they will be faxed to our office.   Thanks

## 2014-12-11 NOTE — Telephone Encounter (Signed)
Routed to Dr. [name] as Juluis Rainier.

## 2014-12-14 ENCOUNTER — Telehealth: Payer: Self-pay | Admitting: Cardiovascular Disease

## 2014-12-14 NOTE — Telephone Encounter (Signed)
[name] wanted you to know the rehab facility had [name] [name]'s blood work done (platelet inhibitor) yesterday at [facility].  Just wanted to make sure you received the results.

## 2014-12-14 NOTE — Telephone Encounter (Signed)
Forward to [employer]	[place]

## 2014-12-20 DIAGNOSIS — N189 Chronic kidney disease, unspecified: Secondary | ICD-10-CM | POA: Diagnosis not present

## 2014-12-20 DIAGNOSIS — G629 Polyneuropathy, unspecified: Secondary | ICD-10-CM | POA: Diagnosis not present

## 2014-12-22 ENCOUNTER — Other Ambulatory Visit: Payer: Self-pay | Admitting: Internal Medicine

## 2014-12-24 DIAGNOSIS — R531 Weakness: Secondary | ICD-10-CM | POA: Diagnosis not present

## 2014-12-24 DIAGNOSIS — M1990 Unspecified osteoarthritis, unspecified site: Secondary | ICD-10-CM | POA: Diagnosis not present

## 2014-12-24 DIAGNOSIS — G309 Alzheimer's disease, unspecified: Secondary | ICD-10-CM | POA: Diagnosis not present

## 2014-12-24 DIAGNOSIS — N183 Chronic kidney disease, stage 3 (moderate): Secondary | ICD-10-CM | POA: Diagnosis not present

## 2014-12-24 DIAGNOSIS — I129 Hypertensive chronic kidney disease with stage 1 through stage 4 chronic kidney disease, or unspecified chronic kidney disease: Secondary | ICD-10-CM | POA: Diagnosis not present

## 2014-12-24 DIAGNOSIS — R2689 Other abnormalities of gait and mobility: Secondary | ICD-10-CM | POA: Diagnosis not present

## 2014-12-24 NOTE — Telephone Encounter (Signed)
[facility] [place] [facility] after [place] and stopped this in october

## 2014-12-24 NOTE — Telephone Encounter (Signed)
Dr Laney Pastor, RF or is the [facility] supposed to be managing this now?

## 2014-12-25 DIAGNOSIS — M1990 Unspecified osteoarthritis, unspecified site: Secondary | ICD-10-CM | POA: Diagnosis not present

## 2014-12-25 DIAGNOSIS — I129 Hypertensive chronic kidney disease with stage 1 through stage 4 chronic kidney disease, or unspecified chronic kidney disease: Secondary | ICD-10-CM | POA: Diagnosis not present

## 2014-12-25 DIAGNOSIS — R531 Weakness: Secondary | ICD-10-CM | POA: Diagnosis not present

## 2014-12-25 DIAGNOSIS — R2689 Other abnormalities of gait and mobility: Secondary | ICD-10-CM | POA: Diagnosis not present

## 2014-12-25 DIAGNOSIS — G309 Alzheimer's disease, unspecified: Secondary | ICD-10-CM | POA: Diagnosis not present

## 2014-12-25 DIAGNOSIS — N183 Chronic kidney disease, stage 3 (moderate): Secondary | ICD-10-CM | POA: Diagnosis not present

## 2014-12-26 DIAGNOSIS — I10 Essential (primary) hypertension: Secondary | ICD-10-CM | POA: Diagnosis not present

## 2014-12-26 DIAGNOSIS — E785 Hyperlipidemia, unspecified: Secondary | ICD-10-CM | POA: Diagnosis not present

## 2014-12-26 DIAGNOSIS — Z8673 Personal history of transient ischemic attack (TIA), and cerebral infarction without residual deficits: Secondary | ICD-10-CM | POA: Diagnosis not present

## 2014-12-26 DIAGNOSIS — R269 Unspecified abnormalities of gait and mobility: Secondary | ICD-10-CM | POA: Diagnosis not present

## 2014-12-26 NOTE — Telephone Encounter (Signed)
Daughter sending lab work done by [facility].  She is not sure this is the test Dr. Loletha Grayer wanted (due to the fact Mr. Lumpp was off Plavix for an undetermined amount of time) P2Y12.  Faxed over a Platelet Function analysis report.  Will have Dr. Loletha Grayer review and advise.  Will call [name] back once Dr. Loletha Grayer review and advises [phone]).

## 2014-12-26 NOTE — Telephone Encounter (Signed)
No, this is not the test I wanted. If he was not taking plavix when it was drawn, it is not a relevant test anyway. Why is he off plavix?

## 2014-12-28 ENCOUNTER — Telehealth: Payer: Self-pay | Admitting: *Deleted

## 2014-12-28 ENCOUNTER — Other Ambulatory Visit: Payer: Self-pay | Admitting: Cardiovascular Disease

## 2014-12-28 DIAGNOSIS — I2510 Atherosclerotic heart disease of native coronary artery without angina pectoris: Secondary | ICD-10-CM

## 2014-12-28 DIAGNOSIS — Z5181 Encounter for therapeutic drug level monitoring: Secondary | ICD-10-CM

## 2014-12-28 NOTE — Telephone Encounter (Signed)
[name] [name] notified Dr. Loletha Grayer placed an order for a P2y12.  She will get him to the lab - he only has PT in home at this time.

## 2014-12-28 NOTE — Telephone Encounter (Signed)
Per [name], Mr. Bagby was transferred from [place] to [facility].  No med list went with the patient, therefore he was not started on any of his meds at that time.  The blood test was done 2 weeks after restarting the Plavix.  If there is another blood test that needs to be done will place order and mail to [place].  Info sent to Dr. Loletha Grayer for review and adivse.

## 2014-12-28 NOTE — Telephone Encounter (Signed)
I put the order in as a future order. Please coordinate with his [facility]

## 2014-12-31 DIAGNOSIS — R531 Weakness: Secondary | ICD-10-CM | POA: Diagnosis not present

## 2014-12-31 DIAGNOSIS — G309 Alzheimer's disease, unspecified: Secondary | ICD-10-CM | POA: Diagnosis not present

## 2014-12-31 DIAGNOSIS — R2689 Other abnormalities of gait and mobility: Secondary | ICD-10-CM | POA: Diagnosis not present

## 2014-12-31 DIAGNOSIS — I129 Hypertensive chronic kidney disease with stage 1 through stage 4 chronic kidney disease, or unspecified chronic kidney disease: Secondary | ICD-10-CM | POA: Diagnosis not present

## 2014-12-31 DIAGNOSIS — N183 Chronic kidney disease, stage 3 (moderate): Secondary | ICD-10-CM | POA: Diagnosis not present

## 2014-12-31 DIAGNOSIS — M1990 Unspecified osteoarthritis, unspecified site: Secondary | ICD-10-CM | POA: Diagnosis not present

## 2015-01-01 DIAGNOSIS — I129 Hypertensive chronic kidney disease with stage 1 through stage 4 chronic kidney disease, or unspecified chronic kidney disease: Secondary | ICD-10-CM | POA: Diagnosis not present

## 2015-01-01 DIAGNOSIS — M1990 Unspecified osteoarthritis, unspecified site: Secondary | ICD-10-CM | POA: Diagnosis not present

## 2015-01-01 DIAGNOSIS — N183 Chronic kidney disease, stage 3 (moderate): Secondary | ICD-10-CM | POA: Diagnosis not present

## 2015-01-01 DIAGNOSIS — R531 Weakness: Secondary | ICD-10-CM | POA: Diagnosis not present

## 2015-01-01 DIAGNOSIS — G309 Alzheimer's disease, unspecified: Secondary | ICD-10-CM | POA: Diagnosis not present

## 2015-01-01 DIAGNOSIS — R2689 Other abnormalities of gait and mobility: Secondary | ICD-10-CM | POA: Diagnosis not present

## 2015-01-02 DIAGNOSIS — N183 Chronic kidney disease, stage 3 (moderate): Secondary | ICD-10-CM | POA: Diagnosis not present

## 2015-01-02 DIAGNOSIS — I129 Hypertensive chronic kidney disease with stage 1 through stage 4 chronic kidney disease, or unspecified chronic kidney disease: Secondary | ICD-10-CM | POA: Diagnosis not present

## 2015-01-02 DIAGNOSIS — R2689 Other abnormalities of gait and mobility: Secondary | ICD-10-CM | POA: Diagnosis not present

## 2015-01-02 DIAGNOSIS — G309 Alzheimer's disease, unspecified: Secondary | ICD-10-CM | POA: Diagnosis not present

## 2015-01-02 DIAGNOSIS — R531 Weakness: Secondary | ICD-10-CM | POA: Diagnosis not present

## 2015-01-02 DIAGNOSIS — M1990 Unspecified osteoarthritis, unspecified site: Secondary | ICD-10-CM | POA: Diagnosis not present

## 2015-01-03 ENCOUNTER — Other Ambulatory Visit: Payer: Self-pay

## 2015-01-03 ENCOUNTER — Other Ambulatory Visit (HOSPITAL_COMMUNITY)
Admit: 2015-01-03 | Discharge: 2015-01-03 | Disposition: A | Discharge status: Home / Self Care | Payer: Medicare Other | Attending: Cardiovascular Disease | Admitting: Cardiovascular Disease

## 2015-01-03 ENCOUNTER — Telehealth: Payer: Self-pay | Admitting: Cardiovascular Disease

## 2015-01-03 DIAGNOSIS — Z5181 Encounter for therapeutic drug level monitoring: Secondary | ICD-10-CM | POA: Diagnosis not present

## 2015-01-03 DIAGNOSIS — I2510 Atherosclerotic heart disease of native coronary artery without angina pectoris: Secondary | ICD-10-CM | POA: Insufficient documentation

## 2015-01-03 LAB — PLATELET INHIBITION P2Y12: PLATELET FUNCTION P2Y12: 206 [PRU] (ref 194–418)

## 2015-01-03 NOTE — Telephone Encounter (Signed)
Received call from patient's daughter [name].She was very upset husband took father to [facility] lab and order for lab work not there.Advised patient needs P2Y12 and that is done at [facility].She stated husband took father to [facility] and they have no order.Stated father had recent lab at [facility] and she is not even sure he needs P2Y12.She faxed me the lab from [facility] which was not P2Y12. Stated he drove 100 miles and is very upset.[facility] lab was called,no order for P2Y12, order faxed to [facility] lab at fax # [phone].

## 2015-01-09 DIAGNOSIS — R2689 Other abnormalities of gait and mobility: Secondary | ICD-10-CM | POA: Diagnosis not present

## 2015-01-09 DIAGNOSIS — M1990 Unspecified osteoarthritis, unspecified site: Secondary | ICD-10-CM | POA: Diagnosis not present

## 2015-01-09 DIAGNOSIS — N183 Chronic kidney disease, stage 3 (moderate): Secondary | ICD-10-CM | POA: Diagnosis not present

## 2015-01-09 DIAGNOSIS — I129 Hypertensive chronic kidney disease with stage 1 through stage 4 chronic kidney disease, or unspecified chronic kidney disease: Secondary | ICD-10-CM | POA: Diagnosis not present

## 2015-01-09 DIAGNOSIS — R531 Weakness: Secondary | ICD-10-CM | POA: Diagnosis not present

## 2015-01-09 DIAGNOSIS — G309 Alzheimer's disease, unspecified: Secondary | ICD-10-CM | POA: Diagnosis not present

## 2015-01-11 DIAGNOSIS — G309 Alzheimer's disease, unspecified: Secondary | ICD-10-CM | POA: Diagnosis not present

## 2015-01-11 DIAGNOSIS — R531 Weakness: Secondary | ICD-10-CM | POA: Diagnosis not present

## 2015-01-11 DIAGNOSIS — N183 Chronic kidney disease, stage 3 (moderate): Secondary | ICD-10-CM | POA: Diagnosis not present

## 2015-01-11 DIAGNOSIS — M1990 Unspecified osteoarthritis, unspecified site: Secondary | ICD-10-CM | POA: Diagnosis not present

## 2015-01-11 DIAGNOSIS — R2689 Other abnormalities of gait and mobility: Secondary | ICD-10-CM | POA: Diagnosis not present

## 2015-01-11 DIAGNOSIS — I129 Hypertensive chronic kidney disease with stage 1 through stage 4 chronic kidney disease, or unspecified chronic kidney disease: Secondary | ICD-10-CM | POA: Diagnosis not present

## 2015-01-15 DIAGNOSIS — G309 Alzheimer's disease, unspecified: Secondary | ICD-10-CM | POA: Diagnosis not present

## 2015-01-15 DIAGNOSIS — M1990 Unspecified osteoarthritis, unspecified site: Secondary | ICD-10-CM | POA: Diagnosis not present

## 2015-01-15 DIAGNOSIS — R2689 Other abnormalities of gait and mobility: Secondary | ICD-10-CM | POA: Diagnosis not present

## 2015-01-15 DIAGNOSIS — I129 Hypertensive chronic kidney disease with stage 1 through stage 4 chronic kidney disease, or unspecified chronic kidney disease: Secondary | ICD-10-CM | POA: Diagnosis not present

## 2015-01-15 DIAGNOSIS — N183 Chronic kidney disease, stage 3 (moderate): Secondary | ICD-10-CM | POA: Diagnosis not present

## 2015-01-15 DIAGNOSIS — R531 Weakness: Secondary | ICD-10-CM | POA: Diagnosis not present

## 2015-01-16 DIAGNOSIS — I129 Hypertensive chronic kidney disease with stage 1 through stage 4 chronic kidney disease, or unspecified chronic kidney disease: Secondary | ICD-10-CM | POA: Diagnosis not present

## 2015-01-16 DIAGNOSIS — M1990 Unspecified osteoarthritis, unspecified site: Secondary | ICD-10-CM | POA: Diagnosis not present

## 2015-01-16 DIAGNOSIS — G309 Alzheimer's disease, unspecified: Secondary | ICD-10-CM | POA: Diagnosis not present

## 2015-01-16 DIAGNOSIS — N183 Chronic kidney disease, stage 3 (moderate): Secondary | ICD-10-CM | POA: Diagnosis not present

## 2015-01-16 DIAGNOSIS — R2689 Other abnormalities of gait and mobility: Secondary | ICD-10-CM | POA: Diagnosis not present

## 2015-01-16 DIAGNOSIS — R531 Weakness: Secondary | ICD-10-CM | POA: Diagnosis not present

## 2015-01-18 DIAGNOSIS — M1990 Unspecified osteoarthritis, unspecified site: Secondary | ICD-10-CM | POA: Diagnosis not present

## 2015-01-18 DIAGNOSIS — R2689 Other abnormalities of gait and mobility: Secondary | ICD-10-CM | POA: Diagnosis not present

## 2015-01-18 DIAGNOSIS — N183 Chronic kidney disease, stage 3 (moderate): Secondary | ICD-10-CM | POA: Diagnosis not present

## 2015-01-18 DIAGNOSIS — G309 Alzheimer's disease, unspecified: Secondary | ICD-10-CM | POA: Diagnosis not present

## 2015-01-18 DIAGNOSIS — I129 Hypertensive chronic kidney disease with stage 1 through stage 4 chronic kidney disease, or unspecified chronic kidney disease: Secondary | ICD-10-CM | POA: Diagnosis not present

## 2015-01-18 DIAGNOSIS — R531 Weakness: Secondary | ICD-10-CM | POA: Diagnosis not present

## 2015-01-22 DIAGNOSIS — N183 Chronic kidney disease, stage 3 (moderate): Secondary | ICD-10-CM | POA: Diagnosis not present

## 2015-01-22 DIAGNOSIS — R531 Weakness: Secondary | ICD-10-CM | POA: Diagnosis not present

## 2015-01-22 DIAGNOSIS — I129 Hypertensive chronic kidney disease with stage 1 through stage 4 chronic kidney disease, or unspecified chronic kidney disease: Secondary | ICD-10-CM | POA: Diagnosis not present

## 2015-01-22 DIAGNOSIS — R2689 Other abnormalities of gait and mobility: Secondary | ICD-10-CM | POA: Diagnosis not present

## 2015-01-22 DIAGNOSIS — M1990 Unspecified osteoarthritis, unspecified site: Secondary | ICD-10-CM | POA: Diagnosis not present

## 2015-01-22 DIAGNOSIS — G309 Alzheimer's disease, unspecified: Secondary | ICD-10-CM | POA: Diagnosis not present

## 2015-01-23 DIAGNOSIS — R2689 Other abnormalities of gait and mobility: Secondary | ICD-10-CM | POA: Diagnosis not present

## 2015-01-23 DIAGNOSIS — I129 Hypertensive chronic kidney disease with stage 1 through stage 4 chronic kidney disease, or unspecified chronic kidney disease: Secondary | ICD-10-CM | POA: Diagnosis not present

## 2015-01-23 DIAGNOSIS — M1990 Unspecified osteoarthritis, unspecified site: Secondary | ICD-10-CM | POA: Diagnosis not present

## 2015-01-23 DIAGNOSIS — G309 Alzheimer's disease, unspecified: Secondary | ICD-10-CM | POA: Diagnosis not present

## 2015-01-23 DIAGNOSIS — N183 Chronic kidney disease, stage 3 (moderate): Secondary | ICD-10-CM | POA: Diagnosis not present

## 2015-01-23 DIAGNOSIS — R531 Weakness: Secondary | ICD-10-CM | POA: Diagnosis not present

## 2015-01-24 ENCOUNTER — Telehealth: Payer: Self-pay | Admitting: Cardiovascular Disease

## 2015-01-24 DIAGNOSIS — R531 Weakness: Secondary | ICD-10-CM | POA: Diagnosis not present

## 2015-01-24 DIAGNOSIS — R2689 Other abnormalities of gait and mobility: Secondary | ICD-10-CM | POA: Diagnosis not present

## 2015-01-24 DIAGNOSIS — N183 Chronic kidney disease, stage 3 (moderate): Secondary | ICD-10-CM | POA: Diagnosis not present

## 2015-01-24 DIAGNOSIS — I129 Hypertensive chronic kidney disease with stage 1 through stage 4 chronic kidney disease, or unspecified chronic kidney disease: Secondary | ICD-10-CM | POA: Diagnosis not present

## 2015-01-24 NOTE — Telephone Encounter (Signed)
She wanted you to know you send them to the wrong lab,it took 2 hrs to get him to the correct lab. When they arrived at the hospital,they still did not have the orders. Did you ever get his lab results?

## 2015-01-28 DIAGNOSIS — R2689 Other abnormalities of gait and mobility: Secondary | ICD-10-CM | POA: Diagnosis not present

## 2015-01-28 DIAGNOSIS — M1990 Unspecified osteoarthritis, unspecified site: Secondary | ICD-10-CM | POA: Diagnosis not present

## 2015-01-28 DIAGNOSIS — G309 Alzheimer's disease, unspecified: Secondary | ICD-10-CM | POA: Diagnosis not present

## 2015-01-28 DIAGNOSIS — R531 Weakness: Secondary | ICD-10-CM | POA: Diagnosis not present

## 2015-01-28 DIAGNOSIS — N183 Chronic kidney disease, stage 3 (moderate): Secondary | ICD-10-CM | POA: Diagnosis not present

## 2015-01-28 DIAGNOSIS — I129 Hypertensive chronic kidney disease with stage 1 through stage 4 chronic kidney disease, or unspecified chronic kidney disease: Secondary | ICD-10-CM | POA: Diagnosis not present

## 2015-01-29 DIAGNOSIS — G309 Alzheimer's disease, unspecified: Secondary | ICD-10-CM | POA: Diagnosis not present

## 2015-01-29 DIAGNOSIS — R531 Weakness: Secondary | ICD-10-CM | POA: Diagnosis not present

## 2015-01-29 DIAGNOSIS — R2689 Other abnormalities of gait and mobility: Secondary | ICD-10-CM | POA: Diagnosis not present

## 2015-01-29 DIAGNOSIS — N183 Chronic kidney disease, stage 3 (moderate): Secondary | ICD-10-CM | POA: Diagnosis not present

## 2015-01-29 DIAGNOSIS — M1990 Unspecified osteoarthritis, unspecified site: Secondary | ICD-10-CM | POA: Diagnosis not present

## 2015-01-29 DIAGNOSIS — I129 Hypertensive chronic kidney disease with stage 1 through stage 4 chronic kidney disease, or unspecified chronic kidney disease: Secondary | ICD-10-CM | POA: Diagnosis not present

## 2015-01-29 NOTE — Telephone Encounter (Signed)
Unable to reach patient by phone.  Message sent through [place].

## 2015-01-30 DIAGNOSIS — I129 Hypertensive chronic kidney disease with stage 1 through stage 4 chronic kidney disease, or unspecified chronic kidney disease: Secondary | ICD-10-CM | POA: Diagnosis not present

## 2015-01-30 DIAGNOSIS — G309 Alzheimer's disease, unspecified: Secondary | ICD-10-CM | POA: Diagnosis not present

## 2015-01-30 DIAGNOSIS — R531 Weakness: Secondary | ICD-10-CM | POA: Diagnosis not present

## 2015-01-30 DIAGNOSIS — M1990 Unspecified osteoarthritis, unspecified site: Secondary | ICD-10-CM | POA: Diagnosis not present

## 2015-01-30 DIAGNOSIS — N183 Chronic kidney disease, stage 3 (moderate): Secondary | ICD-10-CM | POA: Diagnosis not present

## 2015-01-30 DIAGNOSIS — R2689 Other abnormalities of gait and mobility: Secondary | ICD-10-CM | POA: Diagnosis not present

## 2015-02-01 DIAGNOSIS — R531 Weakness: Secondary | ICD-10-CM | POA: Diagnosis not present

## 2015-02-01 DIAGNOSIS — N183 Chronic kidney disease, stage 3 (moderate): Secondary | ICD-10-CM | POA: Diagnosis not present

## 2015-02-01 DIAGNOSIS — M1990 Unspecified osteoarthritis, unspecified site: Secondary | ICD-10-CM | POA: Diagnosis not present

## 2015-02-01 DIAGNOSIS — I129 Hypertensive chronic kidney disease with stage 1 through stage 4 chronic kidney disease, or unspecified chronic kidney disease: Secondary | ICD-10-CM | POA: Diagnosis not present

## 2015-02-01 DIAGNOSIS — G309 Alzheimer's disease, unspecified: Secondary | ICD-10-CM | POA: Diagnosis not present

## 2015-02-01 DIAGNOSIS — R2689 Other abnormalities of gait and mobility: Secondary | ICD-10-CM | POA: Diagnosis not present

## 2015-02-04 DIAGNOSIS — G309 Alzheimer's disease, unspecified: Secondary | ICD-10-CM | POA: Diagnosis not present

## 2015-02-04 DIAGNOSIS — N183 Chronic kidney disease, stage 3 (moderate): Secondary | ICD-10-CM | POA: Diagnosis not present

## 2015-02-04 DIAGNOSIS — R2689 Other abnormalities of gait and mobility: Secondary | ICD-10-CM | POA: Diagnosis not present

## 2015-02-04 DIAGNOSIS — I129 Hypertensive chronic kidney disease with stage 1 through stage 4 chronic kidney disease, or unspecified chronic kidney disease: Secondary | ICD-10-CM | POA: Diagnosis not present

## 2015-02-04 DIAGNOSIS — R531 Weakness: Secondary | ICD-10-CM | POA: Diagnosis not present

## 2015-02-04 DIAGNOSIS — M1990 Unspecified osteoarthritis, unspecified site: Secondary | ICD-10-CM | POA: Diagnosis not present

## 2015-02-05 DIAGNOSIS — N183 Chronic kidney disease, stage 3 (moderate): Secondary | ICD-10-CM | POA: Diagnosis not present

## 2015-02-05 DIAGNOSIS — M1990 Unspecified osteoarthritis, unspecified site: Secondary | ICD-10-CM | POA: Diagnosis not present

## 2015-02-05 DIAGNOSIS — R531 Weakness: Secondary | ICD-10-CM | POA: Diagnosis not present

## 2015-02-05 DIAGNOSIS — I129 Hypertensive chronic kidney disease with stage 1 through stage 4 chronic kidney disease, or unspecified chronic kidney disease: Secondary | ICD-10-CM | POA: Diagnosis not present

## 2015-02-05 DIAGNOSIS — G309 Alzheimer's disease, unspecified: Secondary | ICD-10-CM | POA: Diagnosis not present

## 2015-02-05 DIAGNOSIS — R2689 Other abnormalities of gait and mobility: Secondary | ICD-10-CM | POA: Diagnosis not present

## 2015-02-06 DIAGNOSIS — M1990 Unspecified osteoarthritis, unspecified site: Secondary | ICD-10-CM | POA: Diagnosis not present

## 2015-02-06 DIAGNOSIS — R531 Weakness: Secondary | ICD-10-CM | POA: Diagnosis not present

## 2015-02-06 DIAGNOSIS — N183 Chronic kidney disease, stage 3 (moderate): Secondary | ICD-10-CM | POA: Diagnosis not present

## 2015-02-06 DIAGNOSIS — R2689 Other abnormalities of gait and mobility: Secondary | ICD-10-CM | POA: Diagnosis not present

## 2015-02-06 DIAGNOSIS — G309 Alzheimer's disease, unspecified: Secondary | ICD-10-CM | POA: Diagnosis not present

## 2015-02-06 DIAGNOSIS — I129 Hypertensive chronic kidney disease with stage 1 through stage 4 chronic kidney disease, or unspecified chronic kidney disease: Secondary | ICD-10-CM | POA: Diagnosis not present

## 2015-02-08 DIAGNOSIS — I129 Hypertensive chronic kidney disease with stage 1 through stage 4 chronic kidney disease, or unspecified chronic kidney disease: Secondary | ICD-10-CM | POA: Diagnosis not present

## 2015-02-08 DIAGNOSIS — G309 Alzheimer's disease, unspecified: Secondary | ICD-10-CM | POA: Diagnosis not present

## 2015-02-08 DIAGNOSIS — R531 Weakness: Secondary | ICD-10-CM | POA: Diagnosis not present

## 2015-02-08 DIAGNOSIS — R2689 Other abnormalities of gait and mobility: Secondary | ICD-10-CM | POA: Diagnosis not present

## 2015-02-08 DIAGNOSIS — N183 Chronic kidney disease, stage 3 (moderate): Secondary | ICD-10-CM | POA: Diagnosis not present

## 2015-02-08 DIAGNOSIS — M1990 Unspecified osteoarthritis, unspecified site: Secondary | ICD-10-CM | POA: Diagnosis not present

## 2015-02-11 DIAGNOSIS — R2689 Other abnormalities of gait and mobility: Secondary | ICD-10-CM | POA: Diagnosis not present

## 2015-02-11 DIAGNOSIS — R531 Weakness: Secondary | ICD-10-CM | POA: Diagnosis not present

## 2015-02-11 DIAGNOSIS — I129 Hypertensive chronic kidney disease with stage 1 through stage 4 chronic kidney disease, or unspecified chronic kidney disease: Secondary | ICD-10-CM | POA: Diagnosis not present

## 2015-02-11 DIAGNOSIS — N183 Chronic kidney disease, stage 3 (moderate): Secondary | ICD-10-CM | POA: Diagnosis not present

## 2015-02-11 DIAGNOSIS — M1990 Unspecified osteoarthritis, unspecified site: Secondary | ICD-10-CM | POA: Diagnosis not present

## 2015-02-11 DIAGNOSIS — G309 Alzheimer's disease, unspecified: Secondary | ICD-10-CM | POA: Diagnosis not present

## 2015-02-12 DIAGNOSIS — R2689 Other abnormalities of gait and mobility: Secondary | ICD-10-CM | POA: Diagnosis not present

## 2015-02-12 DIAGNOSIS — N183 Chronic kidney disease, stage 3 (moderate): Secondary | ICD-10-CM | POA: Diagnosis not present

## 2015-02-12 DIAGNOSIS — M1990 Unspecified osteoarthritis, unspecified site: Secondary | ICD-10-CM | POA: Diagnosis not present

## 2015-02-12 DIAGNOSIS — G309 Alzheimer's disease, unspecified: Secondary | ICD-10-CM | POA: Diagnosis not present

## 2015-02-12 DIAGNOSIS — I129 Hypertensive chronic kidney disease with stage 1 through stage 4 chronic kidney disease, or unspecified chronic kidney disease: Secondary | ICD-10-CM | POA: Diagnosis not present

## 2015-02-12 DIAGNOSIS — R531 Weakness: Secondary | ICD-10-CM | POA: Diagnosis not present

## 2015-02-13 DIAGNOSIS — M1990 Unspecified osteoarthritis, unspecified site: Secondary | ICD-10-CM | POA: Diagnosis not present

## 2015-02-13 DIAGNOSIS — I129 Hypertensive chronic kidney disease with stage 1 through stage 4 chronic kidney disease, or unspecified chronic kidney disease: Secondary | ICD-10-CM | POA: Diagnosis not present

## 2015-02-13 DIAGNOSIS — G309 Alzheimer's disease, unspecified: Secondary | ICD-10-CM | POA: Diagnosis not present

## 2015-02-13 DIAGNOSIS — R531 Weakness: Secondary | ICD-10-CM | POA: Diagnosis not present

## 2015-02-13 DIAGNOSIS — N183 Chronic kidney disease, stage 3 (moderate): Secondary | ICD-10-CM | POA: Diagnosis not present

## 2015-02-13 DIAGNOSIS — R2689 Other abnormalities of gait and mobility: Secondary | ICD-10-CM | POA: Diagnosis not present

## 2015-02-15 DIAGNOSIS — M1990 Unspecified osteoarthritis, unspecified site: Secondary | ICD-10-CM | POA: Diagnosis not present

## 2015-02-15 DIAGNOSIS — R2689 Other abnormalities of gait and mobility: Secondary | ICD-10-CM | POA: Diagnosis not present

## 2015-02-15 DIAGNOSIS — R531 Weakness: Secondary | ICD-10-CM | POA: Diagnosis not present

## 2015-02-15 DIAGNOSIS — N183 Chronic kidney disease, stage 3 (moderate): Secondary | ICD-10-CM | POA: Diagnosis not present

## 2015-02-15 DIAGNOSIS — I129 Hypertensive chronic kidney disease with stage 1 through stage 4 chronic kidney disease, or unspecified chronic kidney disease: Secondary | ICD-10-CM | POA: Diagnosis not present

## 2015-02-15 DIAGNOSIS — G309 Alzheimer's disease, unspecified: Secondary | ICD-10-CM | POA: Diagnosis not present

## 2015-02-18 DIAGNOSIS — R531 Weakness: Secondary | ICD-10-CM | POA: Diagnosis not present

## 2015-02-18 DIAGNOSIS — I129 Hypertensive chronic kidney disease with stage 1 through stage 4 chronic kidney disease, or unspecified chronic kidney disease: Secondary | ICD-10-CM | POA: Diagnosis not present

## 2015-02-18 DIAGNOSIS — N183 Chronic kidney disease, stage 3 (moderate): Secondary | ICD-10-CM | POA: Diagnosis not present

## 2015-02-18 DIAGNOSIS — R2689 Other abnormalities of gait and mobility: Secondary | ICD-10-CM | POA: Diagnosis not present

## 2015-02-18 DIAGNOSIS — M1990 Unspecified osteoarthritis, unspecified site: Secondary | ICD-10-CM | POA: Diagnosis not present

## 2015-02-18 DIAGNOSIS — G309 Alzheimer's disease, unspecified: Secondary | ICD-10-CM | POA: Diagnosis not present

## 2015-02-19 DIAGNOSIS — I129 Hypertensive chronic kidney disease with stage 1 through stage 4 chronic kidney disease, or unspecified chronic kidney disease: Secondary | ICD-10-CM | POA: Diagnosis not present

## 2015-02-19 DIAGNOSIS — M1990 Unspecified osteoarthritis, unspecified site: Secondary | ICD-10-CM | POA: Diagnosis not present

## 2015-02-19 DIAGNOSIS — R2689 Other abnormalities of gait and mobility: Secondary | ICD-10-CM | POA: Diagnosis not present

## 2015-02-19 DIAGNOSIS — G309 Alzheimer's disease, unspecified: Secondary | ICD-10-CM | POA: Diagnosis not present

## 2015-02-19 DIAGNOSIS — N183 Chronic kidney disease, stage 3 (moderate): Secondary | ICD-10-CM | POA: Diagnosis not present

## 2015-02-19 DIAGNOSIS — R531 Weakness: Secondary | ICD-10-CM | POA: Diagnosis not present

## 2015-02-22 DIAGNOSIS — F0281 Dementia in other diseases classified elsewhere with behavioral disturbance: Secondary | ICD-10-CM | POA: Diagnosis not present

## 2015-02-22 DIAGNOSIS — G629 Polyneuropathy, unspecified: Secondary | ICD-10-CM | POA: Diagnosis not present

## 2015-02-22 DIAGNOSIS — I129 Hypertensive chronic kidney disease with stage 1 through stage 4 chronic kidney disease, or unspecified chronic kidney disease: Secondary | ICD-10-CM | POA: Diagnosis not present

## 2015-02-22 DIAGNOSIS — F319 Bipolar disorder, unspecified: Secondary | ICD-10-CM | POA: Diagnosis not present

## 2015-02-22 DIAGNOSIS — G309 Alzheimer's disease, unspecified: Secondary | ICD-10-CM | POA: Diagnosis not present

## 2015-02-22 DIAGNOSIS — F4310 Post-traumatic stress disorder, unspecified: Secondary | ICD-10-CM | POA: Diagnosis not present

## 2015-02-25 DIAGNOSIS — G309 Alzheimer's disease, unspecified: Secondary | ICD-10-CM | POA: Diagnosis not present

## 2015-02-25 DIAGNOSIS — F4310 Post-traumatic stress disorder, unspecified: Secondary | ICD-10-CM | POA: Diagnosis not present

## 2015-02-25 DIAGNOSIS — I129 Hypertensive chronic kidney disease with stage 1 through stage 4 chronic kidney disease, or unspecified chronic kidney disease: Secondary | ICD-10-CM | POA: Diagnosis not present

## 2015-02-25 DIAGNOSIS — G629 Polyneuropathy, unspecified: Secondary | ICD-10-CM | POA: Diagnosis not present

## 2015-02-25 DIAGNOSIS — F0281 Dementia in other diseases classified elsewhere with behavioral disturbance: Secondary | ICD-10-CM | POA: Diagnosis not present

## 2015-02-25 DIAGNOSIS — F319 Bipolar disorder, unspecified: Secondary | ICD-10-CM | POA: Diagnosis not present

## 2015-02-27 DIAGNOSIS — F4310 Post-traumatic stress disorder, unspecified: Secondary | ICD-10-CM | POA: Diagnosis not present

## 2015-02-27 DIAGNOSIS — G629 Polyneuropathy, unspecified: Secondary | ICD-10-CM | POA: Diagnosis not present

## 2015-02-27 DIAGNOSIS — I129 Hypertensive chronic kidney disease with stage 1 through stage 4 chronic kidney disease, or unspecified chronic kidney disease: Secondary | ICD-10-CM | POA: Diagnosis not present

## 2015-02-27 DIAGNOSIS — F319 Bipolar disorder, unspecified: Secondary | ICD-10-CM | POA: Diagnosis not present

## 2015-02-27 DIAGNOSIS — G309 Alzheimer's disease, unspecified: Secondary | ICD-10-CM | POA: Diagnosis not present

## 2015-02-27 DIAGNOSIS — F0281 Dementia in other diseases classified elsewhere with behavioral disturbance: Secondary | ICD-10-CM | POA: Diagnosis not present

## 2015-03-04 DIAGNOSIS — F319 Bipolar disorder, unspecified: Secondary | ICD-10-CM | POA: Diagnosis not present

## 2015-03-04 DIAGNOSIS — G629 Polyneuropathy, unspecified: Secondary | ICD-10-CM | POA: Diagnosis not present

## 2015-03-04 DIAGNOSIS — G309 Alzheimer's disease, unspecified: Secondary | ICD-10-CM | POA: Diagnosis not present

## 2015-03-04 DIAGNOSIS — I129 Hypertensive chronic kidney disease with stage 1 through stage 4 chronic kidney disease, or unspecified chronic kidney disease: Secondary | ICD-10-CM | POA: Diagnosis not present

## 2015-03-04 DIAGNOSIS — F0281 Dementia in other diseases classified elsewhere with behavioral disturbance: Secondary | ICD-10-CM | POA: Diagnosis not present

## 2015-03-04 DIAGNOSIS — F4310 Post-traumatic stress disorder, unspecified: Secondary | ICD-10-CM | POA: Diagnosis not present

## 2015-03-06 DIAGNOSIS — G309 Alzheimer's disease, unspecified: Secondary | ICD-10-CM | POA: Diagnosis not present

## 2015-03-06 DIAGNOSIS — F319 Bipolar disorder, unspecified: Secondary | ICD-10-CM | POA: Diagnosis not present

## 2015-03-06 DIAGNOSIS — F0281 Dementia in other diseases classified elsewhere with behavioral disturbance: Secondary | ICD-10-CM | POA: Diagnosis not present

## 2015-03-06 DIAGNOSIS — I129 Hypertensive chronic kidney disease with stage 1 through stage 4 chronic kidney disease, or unspecified chronic kidney disease: Secondary | ICD-10-CM | POA: Diagnosis not present

## 2015-03-06 DIAGNOSIS — G629 Polyneuropathy, unspecified: Secondary | ICD-10-CM | POA: Diagnosis not present

## 2015-03-06 DIAGNOSIS — F4310 Post-traumatic stress disorder, unspecified: Secondary | ICD-10-CM | POA: Diagnosis not present

## 2015-03-11 DIAGNOSIS — F319 Bipolar disorder, unspecified: Secondary | ICD-10-CM | POA: Diagnosis not present

## 2015-03-11 DIAGNOSIS — G309 Alzheimer's disease, unspecified: Secondary | ICD-10-CM | POA: Diagnosis not present

## 2015-03-11 DIAGNOSIS — I129 Hypertensive chronic kidney disease with stage 1 through stage 4 chronic kidney disease, or unspecified chronic kidney disease: Secondary | ICD-10-CM | POA: Diagnosis not present

## 2015-03-11 DIAGNOSIS — F4310 Post-traumatic stress disorder, unspecified: Secondary | ICD-10-CM | POA: Diagnosis not present

## 2015-03-11 DIAGNOSIS — F0281 Dementia in other diseases classified elsewhere with behavioral disturbance: Secondary | ICD-10-CM | POA: Diagnosis not present

## 2015-03-11 DIAGNOSIS — G629 Polyneuropathy, unspecified: Secondary | ICD-10-CM | POA: Diagnosis not present

## 2015-03-13 DIAGNOSIS — I129 Hypertensive chronic kidney disease with stage 1 through stage 4 chronic kidney disease, or unspecified chronic kidney disease: Secondary | ICD-10-CM | POA: Diagnosis not present

## 2015-03-13 DIAGNOSIS — F4310 Post-traumatic stress disorder, unspecified: Secondary | ICD-10-CM | POA: Diagnosis not present

## 2015-03-13 DIAGNOSIS — G629 Polyneuropathy, unspecified: Secondary | ICD-10-CM | POA: Diagnosis not present

## 2015-03-13 DIAGNOSIS — F319 Bipolar disorder, unspecified: Secondary | ICD-10-CM | POA: Diagnosis not present

## 2015-03-13 DIAGNOSIS — F0281 Dementia in other diseases classified elsewhere with behavioral disturbance: Secondary | ICD-10-CM | POA: Diagnosis not present

## 2015-03-13 DIAGNOSIS — G309 Alzheimer's disease, unspecified: Secondary | ICD-10-CM | POA: Diagnosis not present

## 2015-03-18 ENCOUNTER — Encounter: Payer: Self-pay | Admitting: Cardiovascular Disease

## 2015-03-18 ENCOUNTER — Ambulatory Visit (INDEPENDENT_AMBULATORY_CARE_PROVIDER_SITE_OTHER): Payer: Medicare Other | Admitting: Cardiovascular Disease

## 2015-03-18 VITALS — BP 146/74 | HR 57 | Ht 72.05 in | Wt 192.5 lb

## 2015-03-18 DIAGNOSIS — F0281 Dementia in other diseases classified elsewhere with behavioral disturbance: Secondary | ICD-10-CM | POA: Diagnosis not present

## 2015-03-18 DIAGNOSIS — I129 Hypertensive chronic kidney disease with stage 1 through stage 4 chronic kidney disease, or unspecified chronic kidney disease: Secondary | ICD-10-CM | POA: Diagnosis not present

## 2015-03-18 DIAGNOSIS — G309 Alzheimer's disease, unspecified: Secondary | ICD-10-CM | POA: Diagnosis not present

## 2015-03-18 DIAGNOSIS — F4310 Post-traumatic stress disorder, unspecified: Secondary | ICD-10-CM | POA: Diagnosis not present

## 2015-03-18 DIAGNOSIS — I10 Essential (primary) hypertension: Secondary | ICD-10-CM

## 2015-03-18 DIAGNOSIS — G629 Polyneuropathy, unspecified: Secondary | ICD-10-CM | POA: Diagnosis not present

## 2015-03-18 DIAGNOSIS — I2510 Atherosclerotic heart disease of native coronary artery without angina pectoris: Secondary | ICD-10-CM

## 2015-03-18 DIAGNOSIS — F319 Bipolar disorder, unspecified: Secondary | ICD-10-CM | POA: Diagnosis not present

## 2015-03-18 MED ORDER — CLOPIDOGREL BISULFATE 75 MG PO TABS: 75.0000 mg | ORAL_TABLET | Freq: Every day | ORAL | Status: DC

## 2015-03-18 MED ORDER — ISOSORBIDE MONONITRATE ER 60 MG PO TB24: 30.0000 mg | ORAL_TABLET | Freq: Every evening | ORAL | Status: DC

## 2015-03-18 NOTE — Progress Notes (Signed)
Patient ID: [name] [name], male   DOB: 12/08/1925, 82 y.o.   MRN: [id number]    Cardiology Office Note    Date:  03/18/2015   ID:  [name] [name], DOB 06-24-1925, MRN [id number]  PCP:  [name], MD  Cardiologist:   [name], MD   Chief Complaint  Patient presents with  . Follow-up CAD, 1o months s/p PCI-DES    No complaints    History of Present Illness:  [name] [name] is a 82 y.o. male with CAD, returning for follow up.  He has not had any recent serious health problems, although he did have an emergency room visit for disorientation with some memory flashbacks. He has not had angina at rest or with the limited amount of walking that he can perform. He has not had falls but has weak and wobbly legs and unsteady gait due to neuropathy. He denies dyspnea, edema, new focal neurological deficits or intermittent claudication.  Cardia cath on May 08, 2014 showed normal left ventricle systolic function and mild distal anterolateral hypokinesis. He had chronic total occlusion of the proximal LAD with distal filling via left to left and right-to-left collaterals. Mild to moderate lesions were seen in the circumflex and right coronary artery system and a new 95% stenosis in the distal right coronary artery just proximal to the ostium of the PDA with jeopardy to the LAD collaterals. He received a drug-eluting stent (3.518 mm Xience Alpine) to the right coronary artery.  He is quite sedentary due to severe foot neuropathy and is almost nonambulatory. 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     Past Medical History  Diagnosis Date  . Hypertension   . Osteoporosis   . GERD (gastroesophageal reflux disease)   . Neuropathy ([place])   . Skin cancer of nose   . Coronary artery disease   . High cholesterol   . Anginal pain ([place])   . Arthritis     "all over"  . Hyperthyroidism   . Depression ~ 87; ~ 1990    Past Surgical  History  Procedure Laterality Date  . Cardiac catheterization  09/2007  . Coronary angioplasty with stent placement  05/08/2014    "?1"  . Back surgery    . Lumbar laminectomy/decompression microdiscectomy  2011  . Left heart catheterization with coronary angiogram N/A 05/08/2014    Procedure: LEFT HEART CATHETERIZATION WITH CORONARY ANGIOGRAM;  Surgeon: [name], MD; LAD 100% (old), CFX 20%, OM1 30%, pRCA 30%, dRCA 95%, EF 55%     . Cardiac catheterization N/A 05/08/2014    Procedure: CORONARY STENT INTERVENTION;  Surgeon: [name], MD; 3.518 mm Xience Alpine DES to the RCA     Outpatient Prescriptions Prior to Visit  Medication Sig Dispense Refill  . acetaminophen (TYLENOL) 500 MG tablet Take 1,000 mg by mouth every 6 (six) hours as needed (pain).    [name] amLODipine (NORVASC) 10 MG tablet Take 5 mg by mouth daily.     [name] aspirin 81 MG tablet Take 1 tablet (81 mg total) by mouth daily. 30 tablet 11  . atorvastatin (LIPITOR) 20 MG tablet Take 10 mg by mouth daily.    . calcium-vitamin D (OSCAL WITH D) 500-200 MG-UNIT per tablet Take 2 tablets by mouth daily with breakfast.    . cephALEXin (KEFLEX) 250 MG capsule Take 250 mg by mouth 3 (three) times daily.    . Cyanocobalamin (B-12 COMPLIANCE INJECTION  IJ) Inject as directed every 30 (thirty) days.    . finasteride (PROSCAR) 5 MG tablet Take 5 mg by mouth daily.    . fish oil-omega-3 fatty acids 1000 MG capsule Take 2 g by mouth daily.    . hydroxypropyl methylcellulose / hypromellose (ISOPTO TEARS / GONIOVISC) 2.5 % ophthalmic solution Place 1 drop into both eyes as needed for dry eyes.    [name] levalbuterol (XOPENEX HFA) 45 MCG/ACT inhaler Inhale 2 puffs into the lungs every 6 (six) hours as needed for wheezing. He needs a spacer device as well 1 Inhaler 12  . lisinopril-hydrochlorothiazide (PRINZIDE,ZESTORETIC) 20-12.5 MG per tablet Take 1 tablet by mouth 2 (two) times daily.    . metoprolol (LOPRESSOR) 100 MG tablet Take 100 mg by  mouth 2 (two) times daily.    . Naproxen Sod-Diphenhydramine (ALEVE PM PO) Take by mouth.    . pantoprazole (PROTONIX) 40 MG tablet Take 1 tablet (40 mg total) by mouth daily. 30 tablet 11  . polyvinyl alcohol (LIQUIFILM TEARS) 1.4 % ophthalmic solution Place 1 drop into both eyes 4 (four) times daily as needed for dry eyes.    [name] sertraline (ZOLOFT) 50 MG tablet Take 25 mg by mouth daily.    . tamsulosin (FLOMAX) 0.4 MG CAPS capsule Take 1 capsule (0.4 mg total) by mouth daily after supper. 30 capsule 0  . clopidogrel (PLAVIX) 75 MG tablet Take 1 tablet (75 mg total) by mouth daily. 30 tablet 11  . isosorbide mononitrate (IMDUR) 60 MG 24 hr tablet Take 1 tablet (60 mg total) by mouth every evening. 30 tablet 11  . lithium carbonate (LITHOBID) 300 MG CR tablet 1 tablet once a day for 2 days then twice a day until next evaluation 60 tablet 0  . traMADol (ULTRAM) 50 MG tablet Take 1 tablet (50 mg total) by mouth every 6 (six) hours as needed. 25 tablet 0   No facility-administered medications prior to visit.     Allergies:   Cortisone   Social History   Social History  . Marital Status: Divorced    Spouse Name: N/A  . Number of Children: N/A  . Years of Education: N/A   Social History Main Topics  . Smoking status: Former Smoker -- 1.00 packs/day for 20 years    Types: Cigarettes    Quit date: 01/12/1962  . Smokeless tobacco: Never Used  . Alcohol Use: No     Comment: 05/08/2014 "he's been in [place] for ~!35 yrs"  . Drug Use: No  . Sexual Activity: Not Asked   Other Topics Concern  . None   Social History Narrative       ROS:   Please see the history of present illness.    ROS All other systems reviewed and are negative.   PHYSICAL EXAM:   VS:  BP 146/74 mmHg  Pulse 57  Ht 6' 0.05" (1.83 m)  Wt 87.317 kg (192 lb 8 oz)  BMI 26.07 kg/m2   GEN: Well nourished, well developed, in no acute distress HEENT: normal Neck: no JVD, carotid bruits, or masses Cardiac: RRR; no  murmurs, rubs, or gallops,no edema  Respiratory:  clear to auscultation bilaterally, normal work of breathing GI: soft, nontender, nondistended, + BS MS: no deformity or atrophy Skin: warm and dry, no rash Neuro:  Alert and Oriented x 3, Strength and sensation are intact Psych: euthymic mood, full affect  Wt Readings from Last 3 Encounters:  03/18/15 87.317 kg (192 lb 8 oz)  07/17/14 86.183 kg (190 lb)  06/01/14 87.544 kg (193 lb)      Studies/Labs Reviewed:   EKG:  EKG is ordered today.  The ekg ordered today demonstimes bradycardia, first-degree AV block (260 ms), old right bundle branch block  Recent Labs: 04/26/2014: TSH 0.364 05/08/2014: Magnesium 2.0 11/09/2014: ALT 14*; BUN 24*; Creatinine, Ser 1.11; Hemoglobin 15.1; Platelets 240; Potassium 4.0; Sodium 138   Lipid Panel    Component Value Date/Time   CHOL 111 05/10/2014 1015   TRIG 60 05/10/2014 1015   HDL 30* 05/10/2014 1015   CHOLHDL 3.7 05/10/2014 1015   VLDL 12 05/10/2014 1015   LDLCALC 69 05/10/2014 1015   he reports more recent labs were performed at the [facility]:    1. Coronary artery disease involving native coronary artery of native heart without angina pectoris   2. Essential hypertension      PLAN:  In order of problems listed above:  1. CAD: Asymptomatic, free of angina. Continue clopidogrel through the end of April. Try to wean off isosorbide by cutting it in half for a month, discontinuing it altogether if he does not have angina after that. Continue metoprolol, amlodipine and ACE inhibitor. Have asked him to get [place] a copy of his labs from the [facility] 2. HTN: Imperfect control on relatively high doses of 4 different agents. Would like to avoid aggressive reduction in systolic blood pressure in this gentleman with neuropathy and unsteady gait. Continue same medications    Medication Adjustments/Labs and Tests Ordered: Current medicines are reviewed at length with the patient  today.  Concerns regarding medicines are outlined above.  Medication changes, Labs and Tests ordered today are listed in the Patient Instructions below. Patient Instructions  Your physician has recommended you make the following change in your medication:   TAKE CLOPIDOGREL FOR TWO (2) MORE MONTHS, THEN STOP.  DECREASE ISOSORBIDE TO 30 MG DAILY.  IF NO CHEST PAIN OR NEED FOR NTG THIS CAN BE STOPPED AFTER ONE MONTH.   [facility]   Dr. [name] recommends that you schedule a follow-up appointment in: CusterSanda Klein, MD  03/18/2015 8:52 AM    [name], [place], [place]  [address] Phone: [phone]; Fax: [phone]

## 2015-03-18 NOTE — Addendum Note (Signed)
Addended by: Janett Labella A on: 03/18/2015 12:54 PM   Modules accepted: Orders

## 2015-03-18 NOTE — Patient Instructions (Addendum)
Your physician has recommended you make the following change in your medication:   TAKE CLOPIDOGREL FOR TWO (2) MORE MONTHS, THEN STOP.  DECREASE ISOSORBIDE TO 30 MG DAILY.  IF NO CHEST PAIN OR NEED FOR NTG THIS CAN BE STOPPED AFTER ONE MONTH.   [facility]   Dr. [name] recommends that you schedule a follow-up appointment in: [place]

## 2015-03-19 DIAGNOSIS — G629 Polyneuropathy, unspecified: Secondary | ICD-10-CM | POA: Diagnosis not present

## 2015-03-19 DIAGNOSIS — F319 Bipolar disorder, unspecified: Secondary | ICD-10-CM | POA: Diagnosis not present

## 2015-03-19 DIAGNOSIS — F0281 Dementia in other diseases classified elsewhere with behavioral disturbance: Secondary | ICD-10-CM | POA: Diagnosis not present

## 2015-03-19 DIAGNOSIS — G309 Alzheimer's disease, unspecified: Secondary | ICD-10-CM | POA: Diagnosis not present

## 2015-03-20 DIAGNOSIS — G629 Polyneuropathy, unspecified: Secondary | ICD-10-CM | POA: Diagnosis not present

## 2015-03-20 DIAGNOSIS — F0281 Dementia in other diseases classified elsewhere with behavioral disturbance: Secondary | ICD-10-CM | POA: Diagnosis not present

## 2015-03-20 DIAGNOSIS — I129 Hypertensive chronic kidney disease with stage 1 through stage 4 chronic kidney disease, or unspecified chronic kidney disease: Secondary | ICD-10-CM | POA: Diagnosis not present

## 2015-03-20 DIAGNOSIS — G309 Alzheimer's disease, unspecified: Secondary | ICD-10-CM | POA: Diagnosis not present

## 2015-03-20 DIAGNOSIS — F4310 Post-traumatic stress disorder, unspecified: Secondary | ICD-10-CM | POA: Diagnosis not present

## 2015-03-20 DIAGNOSIS — F319 Bipolar disorder, unspecified: Secondary | ICD-10-CM | POA: Diagnosis not present

## 2015-03-25 DIAGNOSIS — G629 Polyneuropathy, unspecified: Secondary | ICD-10-CM | POA: Diagnosis not present

## 2015-03-25 DIAGNOSIS — F0281 Dementia in other diseases classified elsewhere with behavioral disturbance: Secondary | ICD-10-CM | POA: Diagnosis not present

## 2015-03-25 DIAGNOSIS — F319 Bipolar disorder, unspecified: Secondary | ICD-10-CM | POA: Diagnosis not present

## 2015-03-25 DIAGNOSIS — G309 Alzheimer's disease, unspecified: Secondary | ICD-10-CM | POA: Diagnosis not present

## 2015-03-25 DIAGNOSIS — F4310 Post-traumatic stress disorder, unspecified: Secondary | ICD-10-CM | POA: Diagnosis not present

## 2015-03-25 DIAGNOSIS — I129 Hypertensive chronic kidney disease with stage 1 through stage 4 chronic kidney disease, or unspecified chronic kidney disease: Secondary | ICD-10-CM | POA: Diagnosis not present

## 2015-03-27 DIAGNOSIS — F0281 Dementia in other diseases classified elsewhere with behavioral disturbance: Secondary | ICD-10-CM | POA: Diagnosis not present

## 2015-03-27 DIAGNOSIS — I129 Hypertensive chronic kidney disease with stage 1 through stage 4 chronic kidney disease, or unspecified chronic kidney disease: Secondary | ICD-10-CM | POA: Diagnosis not present

## 2015-03-27 DIAGNOSIS — G629 Polyneuropathy, unspecified: Secondary | ICD-10-CM | POA: Diagnosis not present

## 2015-03-27 DIAGNOSIS — F319 Bipolar disorder, unspecified: Secondary | ICD-10-CM | POA: Diagnosis not present

## 2015-03-27 DIAGNOSIS — G309 Alzheimer's disease, unspecified: Secondary | ICD-10-CM | POA: Diagnosis not present

## 2015-03-27 DIAGNOSIS — F4310 Post-traumatic stress disorder, unspecified: Secondary | ICD-10-CM | POA: Diagnosis not present

## 2015-04-01 DIAGNOSIS — I129 Hypertensive chronic kidney disease with stage 1 through stage 4 chronic kidney disease, or unspecified chronic kidney disease: Secondary | ICD-10-CM | POA: Diagnosis not present

## 2015-04-01 DIAGNOSIS — G629 Polyneuropathy, unspecified: Secondary | ICD-10-CM | POA: Diagnosis not present

## 2015-04-01 DIAGNOSIS — F0281 Dementia in other diseases classified elsewhere with behavioral disturbance: Secondary | ICD-10-CM | POA: Diagnosis not present

## 2015-04-01 DIAGNOSIS — F4310 Post-traumatic stress disorder, unspecified: Secondary | ICD-10-CM | POA: Diagnosis not present

## 2015-04-01 DIAGNOSIS — F319 Bipolar disorder, unspecified: Secondary | ICD-10-CM | POA: Diagnosis not present

## 2015-04-01 DIAGNOSIS — G309 Alzheimer's disease, unspecified: Secondary | ICD-10-CM | POA: Diagnosis not present

## 2015-04-03 DIAGNOSIS — G309 Alzheimer's disease, unspecified: Secondary | ICD-10-CM | POA: Diagnosis not present

## 2015-04-03 DIAGNOSIS — I129 Hypertensive chronic kidney disease with stage 1 through stage 4 chronic kidney disease, or unspecified chronic kidney disease: Secondary | ICD-10-CM | POA: Diagnosis not present

## 2015-04-03 DIAGNOSIS — F0281 Dementia in other diseases classified elsewhere with behavioral disturbance: Secondary | ICD-10-CM | POA: Diagnosis not present

## 2015-04-03 DIAGNOSIS — F319 Bipolar disorder, unspecified: Secondary | ICD-10-CM | POA: Diagnosis not present

## 2015-04-03 DIAGNOSIS — F4310 Post-traumatic stress disorder, unspecified: Secondary | ICD-10-CM | POA: Diagnosis not present

## 2015-04-03 DIAGNOSIS — G629 Polyneuropathy, unspecified: Secondary | ICD-10-CM | POA: Diagnosis not present

## 2015-04-08 DIAGNOSIS — G309 Alzheimer's disease, unspecified: Secondary | ICD-10-CM | POA: Diagnosis not present

## 2015-04-08 DIAGNOSIS — F4310 Post-traumatic stress disorder, unspecified: Secondary | ICD-10-CM | POA: Diagnosis not present

## 2015-04-08 DIAGNOSIS — I129 Hypertensive chronic kidney disease with stage 1 through stage 4 chronic kidney disease, or unspecified chronic kidney disease: Secondary | ICD-10-CM | POA: Diagnosis not present

## 2015-04-08 DIAGNOSIS — G629 Polyneuropathy, unspecified: Secondary | ICD-10-CM | POA: Diagnosis not present

## 2015-04-08 DIAGNOSIS — F319 Bipolar disorder, unspecified: Secondary | ICD-10-CM | POA: Diagnosis not present

## 2015-04-08 DIAGNOSIS — F0281 Dementia in other diseases classified elsewhere with behavioral disturbance: Secondary | ICD-10-CM | POA: Diagnosis not present

## 2015-04-10 DIAGNOSIS — G629 Polyneuropathy, unspecified: Secondary | ICD-10-CM | POA: Diagnosis not present

## 2015-04-10 DIAGNOSIS — G309 Alzheimer's disease, unspecified: Secondary | ICD-10-CM | POA: Diagnosis not present

## 2015-04-10 DIAGNOSIS — F0281 Dementia in other diseases classified elsewhere with behavioral disturbance: Secondary | ICD-10-CM | POA: Diagnosis not present

## 2015-04-10 DIAGNOSIS — F319 Bipolar disorder, unspecified: Secondary | ICD-10-CM | POA: Diagnosis not present

## 2015-04-10 DIAGNOSIS — F4310 Post-traumatic stress disorder, unspecified: Secondary | ICD-10-CM | POA: Diagnosis not present

## 2015-04-10 DIAGNOSIS — I129 Hypertensive chronic kidney disease with stage 1 through stage 4 chronic kidney disease, or unspecified chronic kidney disease: Secondary | ICD-10-CM | POA: Diagnosis not present

## 2015-04-15 DIAGNOSIS — F4310 Post-traumatic stress disorder, unspecified: Secondary | ICD-10-CM | POA: Diagnosis not present

## 2015-04-15 DIAGNOSIS — F0281 Dementia in other diseases classified elsewhere with behavioral disturbance: Secondary | ICD-10-CM | POA: Diagnosis not present

## 2015-04-15 DIAGNOSIS — G309 Alzheimer's disease, unspecified: Secondary | ICD-10-CM | POA: Diagnosis not present

## 2015-04-15 DIAGNOSIS — F319 Bipolar disorder, unspecified: Secondary | ICD-10-CM | POA: Diagnosis not present

## 2015-04-15 DIAGNOSIS — I129 Hypertensive chronic kidney disease with stage 1 through stage 4 chronic kidney disease, or unspecified chronic kidney disease: Secondary | ICD-10-CM | POA: Diagnosis not present

## 2015-04-15 DIAGNOSIS — G629 Polyneuropathy, unspecified: Secondary | ICD-10-CM | POA: Diagnosis not present

## 2015-04-17 DIAGNOSIS — F4310 Post-traumatic stress disorder, unspecified: Secondary | ICD-10-CM | POA: Diagnosis not present

## 2015-04-17 DIAGNOSIS — F319 Bipolar disorder, unspecified: Secondary | ICD-10-CM | POA: Diagnosis not present

## 2015-04-17 DIAGNOSIS — G629 Polyneuropathy, unspecified: Secondary | ICD-10-CM | POA: Diagnosis not present

## 2015-04-17 DIAGNOSIS — G309 Alzheimer's disease, unspecified: Secondary | ICD-10-CM | POA: Diagnosis not present

## 2015-04-17 DIAGNOSIS — F0281 Dementia in other diseases classified elsewhere with behavioral disturbance: Secondary | ICD-10-CM | POA: Diagnosis not present

## 2015-04-17 DIAGNOSIS — I129 Hypertensive chronic kidney disease with stage 1 through stage 4 chronic kidney disease, or unspecified chronic kidney disease: Secondary | ICD-10-CM | POA: Diagnosis not present

## 2015-07-01 DIAGNOSIS — N401 Enlarged prostate with lower urinary tract symptoms: Secondary | ICD-10-CM | POA: Diagnosis not present

## 2015-07-01 DIAGNOSIS — R338 Other retention of urine: Secondary | ICD-10-CM | POA: Diagnosis not present

## 2015-10-01 ENCOUNTER — Telehealth: Payer: Self-pay | Admitting: Cardiovascular Disease

## 2015-10-01 NOTE — Telephone Encounter (Signed)
I agree. If there is subjective benefit, continue the isosorbide. If indifferent/no benefit, can just stop it again. MCr

## 2015-10-01 NOTE — Telephone Encounter (Signed)
Left msg advising [name] [name] recommendations stand and to call if questions.

## 2015-10-01 NOTE — Telephone Encounter (Signed)
New message   Daughter calling   Pt C/O medication issue:  1. Name of Medication: isosorbide   2. How are you currently taking this medication (dosage and times per day)? 30 mg    3. Are you having a reaction (difficulty breathing--STAT)? No   4. What is your medication issue? Daughter can discuss further due to H/O cognitive in chest.

## 2015-10-01 NOTE — Telephone Encounter (Signed)
From March OV Notes: "DECREASE ISOSORBIDE TO 30 MG DAILY. IF NO CHEST PAIN OR NEED FOR NTG THIS CAN BE STOPPED AFTER ONE MONTH."  Daughter [name] notes patient has been off his imdur for past few months, but had some return of chest pain recently.  She's not certain if this is anginal, or MSK with exercise - notes patient still fairly active despite being in wheelchair, has been out raking leaves, weedeating recently. Notes the pain is not exertional, not consistently brought on by any particular event/action. Notes no new SOB or other symptoms.  He was having chest discomfort so [name] started him back on his imdur 30mg . Notes she hasn't rechecked with him to determine whether the medication has been helping, will reassess with patient today.  I advised if he seems to get some benefit from it, should not be an issue to restart and just take daily until seen by Dr. [name] (set up appt for Oct 9th).I advised to monitor for symptoms and symptom relief, monitor BPs - she notes his BPs typically run high anyway, but acknowledged rationale for keeping an eye on this. She is aware to call if new concerns, and that I will send msg for further advisements.

## 2015-10-03 ENCOUNTER — Telehealth: Payer: Self-pay | Admitting: Cardiovascular Disease

## 2015-10-03 NOTE — Telephone Encounter (Signed)
Pt went to [facility] appointment and his physican thinks he needs an earlier appointmentt with Dr C. She wanted you to know he is going to see [name] on 10-09-15.The pt had described the pain as a pressure in his chest,that is why the appointment was moved up.

## 2015-10-03 NOTE — Telephone Encounter (Signed)
Returned [name] [name]'s call. She notes [name] [name] was seen for evaluation by PCP at [facility] and they were concerned due to his description of pressure in chest. Notes he restarted his Imdur earlier this week as advised, informing me they moved appt up to see Suanne Marker next week instead of 10/9 w Dr. [name] as previously scheduled. Notes pain not worse and she is not concerned overtly for any urgent action, just wanted to keep me in the loop. Advised OK, aware to call if further concerns. Recommended ED evaluation should patient have any worsening of symptoms. [name] [name] thanked me for the prompt call back. Did not request provider review, will close encounter.

## 2015-10-09 ENCOUNTER — Encounter: Payer: Self-pay | Admitting: Physician Assistant

## 2015-10-09 ENCOUNTER — Ambulatory Visit (INDEPENDENT_AMBULATORY_CARE_PROVIDER_SITE_OTHER): Payer: Medicare Other | Admitting: Physician Assistant

## 2015-10-09 VITALS — BP 122/74 | HR 53 | Ht 63.0 in | Wt 198.2 lb

## 2015-10-09 DIAGNOSIS — I208 Other forms of angina pectoris: Secondary | ICD-10-CM | POA: Diagnosis not present

## 2015-10-09 DIAGNOSIS — R001 Bradycardia, unspecified: Secondary | ICD-10-CM | POA: Diagnosis not present

## 2015-10-09 DIAGNOSIS — R072 Precordial pain: Secondary | ICD-10-CM | POA: Diagnosis not present

## 2015-10-09 DIAGNOSIS — I10 Essential (primary) hypertension: Secondary | ICD-10-CM

## 2015-10-09 DIAGNOSIS — I2089 Other forms of angina pectoris: Secondary | ICD-10-CM

## 2015-10-09 MED ORDER — ISOSORBIDE MONONITRATE ER 60 MG PO TB24: 60.0000 mg | ORAL_TABLET | Freq: Every evening | ORAL | 3 refills | Status: DC

## 2015-10-09 NOTE — Patient Instructions (Signed)
Medications:  Increase Isosorbide to 60 mg daily.   Follow-Up:  Your physician wants you to follow-up in: 6 months with Dr. [name]. You will receive a reminder letter in the mail two months in advance. If you don't receive a letter, please call our office to schedule the follow-up appointment. If you need a refill on your cardiac medications before your next appointment, please call your pharmacy.

## 2015-10-09 NOTE — Progress Notes (Signed)
Cardiology Office Note   Date:  10/09/2015   ID:  [name] [name], DOB Jun 25, 1925, MRN [id number]  PCP:  [name], MD  Cardiologist:  Dr [name] 03/2015 [name], PA-C   Chief Complaint  Patient presents with  . Follow-up    per triage call for CP--pt states today: non-exertional CP--sometimes sharp and sometimes pressure; DOE; denies swelling and dizziness.    History of Present Illness: [name] [name] is a 82 y.o. male who was in [name] [place] a child, with a history of chronic LAD occlusion w/ DES RCA 2016 by Dr Claiborne Billings, GERD, HTN, HLD, hyperthyroid, peripheral neuropathy (feet). EF 50%. Imdur used periodically for angina. HOH (both he and his daughter). RBBB  Recently started Imdur again for angina>>o.v. Requested.  [name] [name] presents for Evaluation and treatment of chest pain.  [name] [name] has recurrent chest pain. Sometimes it is there when he wakes up. Sometimes it is there during the day. His daughter thinks that activity might make it a little bit better. He has not tried any nitroglycerin. He started taking the Imdur at 30 mg daily about a week ago. He thinks the pain got a little better after he started taking the Imdur. He is no longer on Plavix. He has trouble rating the pain. He has chronic dyspnea on exertion but doesn't think it has changed any recently. He does not have problems with nausea or vomiting from the pain.  His exertion level is extremely limited by his neuropathy. He uses a wheelchair to get around now. He will try to do a little bit of weeding in the garden. Most of his care is at the [place] [facility]. They have referred him to physical therapy which may help him.   Past Medical History:  Diagnosis Date  . Anginal pain ([place])   . Arthritis    "all over"  . Coronary artery disease   . Depression ~ 57; ~ 1990  . GERD (gastroesophageal reflux disease)   . High cholesterol   . Hypertension   . Hyperthyroidism   . Neuropathy ([place])   . Osteoporosis   .  Skin cancer of nose     Past Surgical History:  Procedure Laterality Date  . BACK SURGERY    . CARDIAC CATHETERIZATION  09/2007  . CARDIAC CATHETERIZATION N/A 05/08/2014   Procedure: CORONARY STENT INTERVENTION;  Surgeon: [name], MD; 3.518 mm Xience Alpine DES to the RCA   . CORONARY ANGIOPLASTY WITH STENT PLACEMENT  05/08/2014    3.518 mm Xience Alpine DES RCA  . LEFT HEART CATHETERIZATION WITH CORONARY ANGIOGRAM N/A 05/08/2014   Procedure: LEFT HEART CATHETERIZATION WITH CORONARY ANGIOGRAM;  Surgeon: [name], MD; LAD 100% (old), CFX 20%, OM1 30%, pRCA 30%, dRCA 95%, EF 55%     . LUMBAR LAMINECTOMY/DECOMPRESSION MICRODISCECTOMY  2011    Current Outpatient Prescriptions  Medication Sig Dispense Refill  . acetaminophen (TYLENOL) 500 MG tablet Take 1,000 mg by mouth every 6 (six) hours as needed (pain).    [name] amLODipine (NORVASC) 10 MG tablet Take 5 mg by mouth daily.     [name] aspirin 81 MG tablet Take 1 tablet (81 mg total) by mouth daily. 30 tablet 11  . atorvastatin (LIPITOR) 20 MG tablet Take 10 mg by mouth daily.    . calcium-vitamin D (OSCAL WITH D) 500-200 MG-UNIT per tablet Take 2 tablets by mouth daily with breakfast.    . cephALEXin (KEFLEX) 250 MG capsule Take 250 mg by mouth  3 (three) times daily.    . clopidogrel (PLAVIX) 75 MG tablet Take 1 tablet (75 mg total) by mouth daily. 30 tablet 1  . Cyanocobalamin (B-12 COMPLIANCE INJECTION IJ) Inject as directed every 30 (thirty) days.    . finasteride (PROSCAR) 5 MG tablet Take 5 mg by mouth daily.    . fish oil-omega-3 fatty acids 1000 MG capsule Take 2 g by mouth daily.    . hydroxypropyl methylcellulose / hypromellose (ISOPTO TEARS / GONIOVISC) 2.5 % ophthalmic solution Place 1 drop into both eyes as needed for dry eyes.    . isosorbide mononitrate (IMDUR) 60 MG 24 hr tablet Take 0.5 tablets (30 mg total) by mouth every evening. 30 tablet 11  . levalbuterol (XOPENEX HFA) 45 MCG/ACT inhaler Inhale 2 puffs into the  lungs every 6 (six) hours as needed for wheezing. He needs a spacer device as well 1 Inhaler 12  . lisinopril-hydrochlorothiazide (PRINZIDE,ZESTORETIC) 20-12.5 MG per tablet Take 1 tablet by mouth 2 (two) times daily.    . metoprolol (LOPRESSOR) 100 MG tablet Take 100 mg by mouth 2 (two) times daily.    . Naproxen Sod-Diphenhydramine (ALEVE PM PO) Take by mouth.    . pantoprazole (PROTONIX) 40 MG tablet Take 1 tablet (40 mg total) by mouth daily. 30 tablet 11  . polyvinyl alcohol (LIQUIFILM TEARS) 1.4 % ophthalmic solution Place 1 drop into both eyes 4 (four) times daily as needed for dry eyes.    [name] sertraline (ZOLOFT) 50 MG tablet Take 25 mg by mouth daily.    . tamsulosin (FLOMAX) 0.4 MG CAPS capsule Take 1 capsule (0.4 mg total) by mouth daily after supper. 30 capsule 0   No current facility-administered medications for this visit.     Allergies:   Cortisone    Social History:  The patient  reports that he quit smoking about 53 years ago. His smoking use included Cigarettes. He has a 20.00 pack-year smoking history. He has never used smokeless tobacco. He reports that he does not drink alcohol or use drugs.   Family History:  The patient's family history is not on file.    ROS:  Please see the history of present illness. All other systems are reviewed and negative.    PHYSICAL EXAM: VS:  BP 122/74 (BP Location: Left Arm, Patient Position: Sitting, Cuff Size: Normal)   Pulse (!) 53   Ht 5\' 3"  (1.6 m)   Wt 198 lb 3.2 oz (89.9 kg)   BMI 35.11 kg/m  , BMI Body mass index is 35.11 kg/m. GEN: Well nourished, well developed, male in no acute distress  HEENT: normal for age  Neck: no JVD but difficult to assess secondary to body habitus, no carotid bruit, no masses Cardiac: RRR; no murmur, no rubs, or gallops Respiratory:  Decreased breath sounds bases bilaterally, normal work of breathing GI: soft, nontender, nondistended, + BS MS: no deformity or atrophy; no edema; distal pulses are  2+ in upper extremities   Skin: warm and dry, no rash Neuro:  Strength and sensation are intact Psych: euthymic mood, full affect   EKG:  EKG is ordered today. The ekg ordered today demonstrates sinus bradycardia, rate 53 with a right bundle branch block which is old.   Recent Labs: 11/09/2014: ALT 14; BUN 24; Creatinine, Ser 1.11; Hemoglobin 15.1; Platelets 240; Potassium 4.0; Sodium 138    Lipid Panel    Component Value Date/Time   CHOL 111 05/10/2014 1015   TRIG 60 05/10/2014 1015  HDL 30 (L) 05/10/2014 1015   CHOLHDL 3.7 05/10/2014 1015   VLDL 12 05/10/2014 1015   LDLCALC 69 05/10/2014 1015     Wt Readings from Last 3 Encounters:  10/09/15 198 lb 3.2 oz (89.9 kg)  03/18/15 192 lb 8 oz (87.3 kg)  07/17/14 190 lb (86.2 kg)     Other studies Reviewed: Additional studies/ records that were reviewed today include: Office notes, hospital records and testing.  ASSESSMENT AND PLAN:  1.  Anginal chest pain: He needs to be treated aggressively for CAD. His last stent was a year ago, so we will treat for angina coming from his occluded LAD. We will increase his Imdur to 60 mg daily. They will let us know if this helps his symptoms or not.   He is encouraged to be as active as his neuropathy will allow. However, his balance is not good enough for him to walk without someone with him, even using a walker.  He did not have any obvious symptoms of volume overload, but his weight is trending up. If increasing the Imdur does not adequately control his symptoms, we will try giving him short-term Lasix for diuresis.  2. Hypertension: His blood pressure is well controlled.  3. Sinus bradycardia: He is on metoprolol 100 mg twice a day. His heart rate is in the low 50s, but he is asymptomatic with this. His dose on the metoprolol has not changed recently. Continue the current dose, watch for symptoms.   Current medicines are reviewed at length with the patient today.  The patient does  not have concerns regarding medicines.  The following changes have been made:  Increase Imdur  Labs/ tests ordered today include:   Orders Placed This Encounter  Procedures  . EKG 12-Lead     Disposition:   FU with Dr. [name]  Signed, Lenoard Aden  10/09/2015 9:36 AM    [place] Phone: [phone]; Fax: [phone]  This note was written with the assistance of speech recognition software. Please excuse any transcriptional errors.

## 2015-10-09 NOTE — Progress Notes (Signed)
[place]

## 2015-10-21 ENCOUNTER — Ambulatory Visit: Payer: Self-pay | Admitting: Cardiovascular Disease

## 2015-12-02 ENCOUNTER — Encounter: Payer: Self-pay | Admitting: Physician Assistant

## 2015-12-02 ENCOUNTER — Ambulatory Visit (INDEPENDENT_AMBULATORY_CARE_PROVIDER_SITE_OTHER): Payer: Medicare Other | Admitting: Physician Assistant

## 2015-12-02 ENCOUNTER — Ambulatory Visit: Payer: Medicare Other | Admitting: Physician Assistant

## 2015-12-02 ENCOUNTER — Telehealth: Payer: Self-pay | Admitting: *Deleted

## 2015-12-02 ENCOUNTER — Ambulatory Visit
Admission: RE | Admit: 2015-12-02 | Discharge: 2015-12-02 | Disposition: A | Discharge status: Home / Self Care | Payer: Medicare Other | Source: Ambulatory Visit | Attending: Physician Assistant | Admitting: Physician Assistant

## 2015-12-02 VITALS — BP 123/71 | HR 58 | Ht 63.0 in | Wt 201.4 lb

## 2015-12-02 DIAGNOSIS — R0989 Other specified symptoms and signs involving the circulatory and respiratory systems: Secondary | ICD-10-CM | POA: Diagnosis not present

## 2015-12-02 DIAGNOSIS — R05 Cough: Secondary | ICD-10-CM | POA: Diagnosis not present

## 2015-12-02 DIAGNOSIS — Z9189 Other specified personal risk factors, not elsewhere classified: Secondary | ICD-10-CM | POA: Diagnosis not present

## 2015-12-02 DIAGNOSIS — R0602 Shortness of breath: Secondary | ICD-10-CM | POA: Diagnosis not present

## 2015-12-02 DIAGNOSIS — R0609 Other forms of dyspnea: Secondary | ICD-10-CM | POA: Diagnosis not present

## 2015-12-02 DIAGNOSIS — R059 Cough, unspecified: Secondary | ICD-10-CM

## 2015-12-02 DIAGNOSIS — J690 Pneumonitis due to inhalation of food and vomit: Secondary | ICD-10-CM

## 2015-12-02 DIAGNOSIS — I208 Other forms of angina pectoris: Secondary | ICD-10-CM

## 2015-12-02 DIAGNOSIS — I2510 Atherosclerotic heart disease of native coronary artery without angina pectoris: Secondary | ICD-10-CM

## 2015-12-02 MED ORDER — TAMSULOSIN HCL 0.4 MG PO CAPS: 0.4000 mg | ORAL_CAPSULE | Freq: Every day | ORAL | 0 refills | Status: AC

## 2015-12-02 MED ORDER — ISOSORBIDE MONONITRATE ER 60 MG PO TB24: 30.0000 mg | ORAL_TABLET | Freq: Every evening | ORAL | 3 refills | Status: DC

## 2015-12-02 NOTE — Progress Notes (Signed)
Cardiology Office Note   Date:  12/02/2015   ID:  [name] [name], DOB [birth year], MRN [id number]  PCP:  [name] Mechanic, MD  Cardiologist:  Dr [name] 03/2015  [name] [name], [name] [name] 10/09/2015  Chief Complaint  Patient presents with  . Follow-up    pt c/o chest pain     History of Present Illness: [name] [name] is an 82 y.o. male who was in [name] [place] a child, with a history of chronic LAD occlusion w/ DES RCA 2016 by Dr Claiborne Billings, GERD, HTN, HLD, hyperthyroid, peripheral neuropathy (feet). EF 50%. Imdur used periodically for angina. HOH (both he and his older daughter). RBBB  Seen 09/27 by me, having angina, Imdur increased. Wt trending up but volume ok.   [name] [name] presents for follow up of his cardiac issues. His youngest daughter is with him today. His daughter, [name] [name], that usually brings him had a fall with several fractures. She is currently in a rehab facility in [place].  He reports waking up during the night. Sometimes with chest pain and sometimes without. Does not think he is SOB. He feels this has gotten better. He was not sleeping well, his sertraline was changed to trazodone, and that may be the reason he is getting more rest.  A few weeks ago, he was extremely tired and not sleeping well. He has used a humidifier several times, and feels it helps. He does not use it daily because it is hard for him to fill it. He has had problems with nasal congestion, not being able to breathe through his nose and getting drainage down the back of his throat. He is also having swallowing difficulties, is on [employer] when at the [facility] 2 days/week. He has a swallow study ordered by his primary MD, it has not happened yet.  His daughter reports that he coughs a great deal when he drinks water, some when he is eating. She feels he breathes hard at times, not in response to exertion. No productive cough, no fevers.   Past Medical History:  Diagnosis Date  . Anginal pain ([place])     . Arthritis    "all over"  . Coronary artery disease   . Depression ~ 68; ~ 1990  . GERD (gastroesophageal reflux disease)   . High cholesterol   . Hypertension   . Hyperthyroidism   . Neuropathy ([place])   . Osteoporosis   . Skin cancer of nose     Past Surgical History:  Procedure Laterality Date  . BACK SURGERY    . CARDIAC CATHETERIZATION  09/2007  . CARDIAC CATHETERIZATION N/A 05/08/2014   Procedure: CORONARY STENT INTERVENTION;  Surgeon: [name], MD; 3.518 mm Xience Alpine DES to the RCA   . LEFT HEART CATHETERIZATION WITH CORONARY ANGIOGRAM N/A 05/08/2014   Procedure: LEFT HEART CATHETERIZATION WITH CORONARY ANGIOGRAM;  Surgeon: [name], MD; LAD 100% (old), CFX 20%, OM1 30%, pRCA 30%, dRCA 95%, EF 55%     . LUMBAR LAMINECTOMY/DECOMPRESSION MICRODISCECTOMY  2011    Current Outpatient Prescriptions  Medication Sig Dispense Refill  . acetaminophen (TYLENOL) 500 MG tablet Take 1,000 mg by mouth every 6 (six) hours as needed (pain).    [name] amLODipine (NORVASC) 10 MG tablet Take 5 mg by mouth daily.     [name] aspirin 81 MG tablet Take 1 tablet (81 mg total) by mouth daily. 30 tablet 11  . atorvastatin (LIPITOR) 20 MG tablet Take 10 mg by mouth daily.    [name]  calcium-vitamin D (OSCAL WITH D) 500-200 MG-UNIT per tablet Take 1 tablet by mouth 2 (two) times daily.     . Cyanocobalamin (B-12 COMPLIANCE INJECTION IJ) Inject as directed every 30 (thirty) days.    . finasteride (PROSCAR) 5 MG tablet Take 5 mg by mouth daily.    . isosorbide mononitrate (IMDUR) 60 MG 24 hr tablet Take 1 tablet (60 mg total) by mouth every evening. (Patient taking differently: Take 30 mg by mouth every evening. ) 90 tablet 3  . lisinopril-hydrochlorothiazide (PRINZIDE,ZESTORETIC) 20-12.5 MG per tablet Take 2 tablets by mouth every morning.     . Melatonin 1 MG TABS Take 1 tablet by mouth every evening.    . metoprolol (LOPRESSOR) 100 MG tablet Take 100 mg by mouth 2 (two) times daily.    . Omega-3  Fatty Acids (FISH OIL) 1200 MG CAPS Take 1 capsule by mouth daily.    . pantoprazole (PROTONIX) 40 MG tablet Take 1 tablet (40 mg total) by mouth daily. 30 tablet 11  . tamsulosin (FLOMAX) 0.4 MG CAPS capsule Take 1 capsule (0.4 mg total) by mouth daily after supper. (Patient taking differently: Take 0.4 mg by mouth daily after breakfast. ) 30 capsule 0  . Tetrahydrozoline HCl (EYE DROPS OP) Apply 1 drop to eye daily as needed.    . traZODone (DESYREL) 50 MG tablet Take 50 mg by mouth at bedtime.      No current facility-administered medications for this visit.     Allergies:   Cortisone    Social History:  The patient  reports that he quit smoking about 53 years ago. His smoking use included Cigarettes. He has a 20.00 pack-year smoking history. He has never used smokeless tobacco. He reports that he does not drink alcohol or use drugs.   Family History:  The patient's family history includes Asthma in his mother; CAD in his mother.    ROS:  Please see the history of present illness. All other systems are reviewed and negative.    PHYSICAL EXAM: VS:  BP 123/71 (BP Location: Right Arm, Patient Position: Sitting, Cuff Size: Normal)   Pulse (!) 58   Ht 5\' 3"  (1.6 m)   Wt 201 lb 6.4 oz (91.4 kg)   SpO2 95%   BMI 35.68 kg/m  , BMI Body mass index is 35.68 kg/m. GEN: Well nourished, well developed, elderly male in no acute distress  HEENT: normal for age  Neck: no JVD, minimal HJR, no carotid bruit, no masses Cardiac: RRR; no murmur, no rubs, or gallops Respiratory: scattered rales w/ decreased BS bases bilaterally, normal work of breathing GI: soft, nontender, nondistended, + BS MS: no deformity or atrophy; no edema; distal pulses are 2+ in all 4 extremities   Skin: warm and dry, no rash Neuro:  Strength and sensation are intact Psych: euthymic mood, full affect   EKG:  EKG is ordered today. The ekg ordered today demonstrates SR, RBBB is old, no new changes   Recent Labs: No  results found for requested labs within last 8760 hours.    Lipid Panel    Component Value Date/Time   CHOL 111 05/10/2014 1015   TRIG 60 05/10/2014 1015   HDL 30 (L) 05/10/2014 1015   CHOLHDL 3.7 05/10/2014 1015   VLDL 12 05/10/2014 1015   LDLCALC 69 05/10/2014 1015     Wt Readings from Last 3 Encounters:  12/02/15 201 lb 6.4 oz (91.4 kg)  10/09/15 198 lb 3.2 oz (89.9 kg)  03/18/15 192 lb 8 oz (87.3 kg)     Other studies Reviewed: Additional studies/ records that were reviewed today include: office notes, hospital records and testing.  ASSESSMENT AND PLAN:  1.  Anginal chest pain: his symptoms have improved. He is enjoying the activities he is doing. He is encouraged to do any activities he can.   2. DOE, weights trending up, congestion: he does not have obvious volume overload by exam. He has possible aspiration, improved by small bites and thickener for liquids. He is reluctant to cut his food into small pieces and take longer to eat at home, a leftover from his hunger during childhood in [place]. I will ck a CXR today, to make sure he just has atelectasis, not pneumonia or CHF. He is encouraged to weigh himself 1-2 x week and to track his weight more carefully. Hopefully, he will not continue to gain weight. He is on a low dose of HCTZ, continue this.  3. CAD: he is on good therapy with ASA, BB, statin, ACE.   4. Hyperlipidemia: continue rx, recheck     Current medicines are reviewed at length with the patient today.  The patient does not have concerns regarding medicines.  The following changes have been made:  no change  Labs/ tests ordered today include:   Orders Placed This Encounter  Procedures  . DG Chest 2 View  . EKG 12-Lead     Disposition:   FU with Dr [name]  Signed, [name] Ferries, PA-C  12/02/2015 3:26 PM    [facility] Phone: [phone]; Fax: [phone]  This note was written with the assistance of speech  recognition software. Please excuse any transcriptional errors.

## 2015-12-02 NOTE — Telephone Encounter (Signed)
SPOKE TO DAUGHTER-  DAUGHTER STATES TO CALL SISTER BARBARA SHE WILL BE BRING PATIENT TO APPOINTMENT TODAY.  RN CALLED BARBARA  AGREED TO COME AT 2 PM TODAY FOR APPOINTMENT.

## 2015-12-02 NOTE — Patient Instructions (Signed)
A chest x-ray takes a picture of the organs and structures inside the chest, including the heart, lungs, and blood vessels. This test can show several things, including, whether the heart is enlarges; whether fluid is building up in the lungs PLEASE GO TO [place].   NO OTHER CHANGES WITH TREATMENT OR MEDICATIONS   Your physician recommends that you schedule a follow-up appointment in: 2 [place].

## 2015-12-02 NOTE — Progress Notes (Deleted)
Cardiology Office Note   Date:  12/02/2015   ID:  [name] [name], DOB 05/19/1925, MRN [id number]  PCP:  [name], MD  Cardiologist:  Dr [name] 03/2015  [name], PA-C 10/09/2015  No chief complaint on file.   History of Present Illness: [name] [name] is a 82 y.o. male who was in [name] [place] a child, with a history of chronic LAD occlusion w/ DES RCA 2016 by Dr Claiborne Billings, GERD, HTN, HLD, hyperthyroid, peripheral neuropathy (feet). EF 50%. Imdur used periodically for angina. HOH (both he and his daughter). RBBB  09/27, seen by me for angina, Imdur increased to 60 mg qd. No obvious overload but weight trending up, HR low 50s but asymptomatic, BP ok.  [name] presents for ***   Past Medical History:  Diagnosis Date  . Anginal pain ([place])   . Arthritis    "all over"  . Coronary artery disease   . Depression ~ 79; ~ 1990  . GERD (gastroesophageal reflux disease)   . High cholesterol   . Hypertension   . Hyperthyroidism   . Neuropathy ([place])   . Osteoporosis   . Skin cancer of nose     Past Surgical History:  Procedure Laterality Date  . BACK SURGERY    . CARDIAC CATHETERIZATION  09/2007  . CARDIAC CATHETERIZATION N/A 05/08/2014   Procedure: CORONARY STENT INTERVENTION;  Surgeon: [name], MD; 3.518 mm Xience Alpine DES to the RCA   . CORONARY ANGIOPLASTY WITH STENT PLACEMENT  05/08/2014    3.518 mm Xience Alpine DES RCA  . LEFT HEART CATHETERIZATION WITH CORONARY ANGIOGRAM N/A 05/08/2014   Procedure: LEFT HEART CATHETERIZATION WITH CORONARY ANGIOGRAM;  Surgeon: [name], MD; LAD 100% (old), CFX 20%, OM1 30%, pRCA 30%, dRCA 95%, EF 55%     . LUMBAR LAMINECTOMY/DECOMPRESSION MICRODISCECTOMY  2011    Current Outpatient Prescriptions  Medication Sig Dispense Refill  . acetaminophen (TYLENOL) 500 MG tablet Take 1,000 mg by mouth every 6 (six) hours as needed (pain).    [name] amLODipine (NORVASC) 10 MG tablet Take 5 mg by mouth daily.     [name] aspirin  81 MG tablet Take 1 tablet (81 mg total) by mouth daily. 30 tablet 11  . atorvastatin (LIPITOR) 20 MG tablet Take 10 mg by mouth daily.    . calcium-vitamin D (OSCAL WITH D) 500-200 MG-UNIT per tablet Take 1 tablet by mouth 2 (two) times daily.     . Cyanocobalamin (B-12 COMPLIANCE INJECTION IJ) Inject as directed every 30 (thirty) days.    . finasteride (PROSCAR) 5 MG tablet Take 5 mg by mouth daily.    . isosorbide mononitrate (IMDUR) 60 MG 24 hr tablet Take 1 tablet (60 mg total) by mouth every evening. 90 tablet 3  . lisinopril-hydrochlorothiazide (PRINZIDE,ZESTORETIC) 20-12.5 MG per tablet Take 2 tablets by mouth every morning.     . Melatonin 1 MG TABS Take 1 tablet by mouth every evening.    . metoprolol (LOPRESSOR) 100 MG tablet Take 100 mg by mouth 2 (two) times daily.    . Omega-3 Fatty Acids (FISH OIL) 1200 MG CAPS Take 1 capsule by mouth daily.    . pantoprazole (PROTONIX) 40 MG tablet Take 1 tablet (40 mg total) by mouth daily. 30 tablet 11  . tamsulosin (FLOMAX) 0.4 MG CAPS capsule Take 1 capsule (0.4 mg total) by mouth daily after supper. (Patient taking differently: Take 0.4 mg by mouth daily after breakfast. ) 30 capsule 0  .  Tetrahydrozoline HCl (EYE DROPS OP) Apply 1 drop to eye daily as needed.    . traZODone (DESYREL) 50 MG tablet Take 25 mg by mouth at bedtime.     No current facility-administered medications for this visit.     Allergies:   Cortisone    Social History:  The patient  reports that he quit smoking about 53 years ago. His smoking use included Cigarettes. He has a 20.00 pack-year smoking history. He has never used smokeless tobacco. He reports that he does not drink alcohol or use drugs.   Family History:  The patient's family history is not on file.    ROS:  Please see the history of present illness. All other systems are reviewed and negative.    PHYSICAL EXAM: VS:  There were no vitals taken for this visit. , BMI There is no height or weight on file  to calculate BMI. GEN: Well nourished, well developed, male in no acute distress  HEENT: normal for age  Neck: no JVD, no carotid bruit, no masses Cardiac: RRR; no murmur, no rubs, or gallops Respiratory:  clear to auscultation bilaterally, normal work of breathing GI: soft, nontender, nondistended, + BS MS: no deformity or atrophy; no edema; distal pulses are 2+ in all 4 extremities   Skin: warm and dry, no rash Neuro:  Strength and sensation are intact Psych: euthymic mood, full affect   EKG:  EKG {ACTION; IS/IS OBS:[id number]} ordered today. The ekg ordered today demonstrates ***   Recent Labs: No results found for requested labs within last 8760 hours.    Lipid Panel    Component Value Date/Time   CHOL 111 05/10/2014 1015   TRIG 60 05/10/2014 1015   HDL 30 (L) 05/10/2014 1015   CHOLHDL 3.7 05/10/2014 1015   VLDL 12 05/10/2014 1015   LDLCALC 69 05/10/2014 1015     Wt Readings from Last 3 Encounters:  10/09/15 198 lb 3.2 oz (89.9 kg)  03/18/15 192 lb 8 oz (87.3 kg)  07/17/14 190 lb (86.2 kg)     Other studies Reviewed: Additional studies/ records that were reviewed today include: ***.  ASSESSMENT AND PLAN:  1.  ***   Current medicines are reviewed at length with the patient today.  The patient {ACTIONS; HAS/DOES NOT HAVE:19233} concerns regarding medicines.  The following changes have been made:  {PLAN; NO CHANGE:13088:s}  Labs/ tests ordered today include: *** No orders of the defined types were placed in this encounter.    Disposition:   FU with ***  Signed, [name], PA-C  12/02/2015 7:57 AM    [facility] Phone: [phone]; Fax: [phone]  This note was written with the assistance of speech recognition software. Please excuse any transcriptional errors.

## 2015-12-02 NOTE — Telephone Encounter (Signed)
Left message to call back in regards to appointment today.

## 2015-12-03 NOTE — Progress Notes (Signed)
Danke schoen [employer]

## 2015-12-04 ENCOUNTER — Telehealth: Payer: Self-pay | Admitting: Physician Assistant

## 2015-12-04 NOTE — Telephone Encounter (Signed)
She would like pt's xray results from Monday please.

## 2015-12-04 NOTE — Telephone Encounter (Signed)
I've spoken w daughter on listed DPR sheet and given CXR results. She's voiced understanding of findings and no further questions. Voiced thanks for call.

## 2016-01-17 ENCOUNTER — Emergency Department (HOSPITAL_COMMUNITY): Payer: Medicare Other

## 2016-01-17 ENCOUNTER — Emergency Department (HOSPITAL_COMMUNITY)
Admission: EM | Admit: 2016-01-17 | Discharge: 2016-01-17 | Disposition: A | Discharge status: Home / Self Care | Payer: Medicare Other | Attending: Emergency Medicine | Admitting: Emergency Medicine

## 2016-01-17 ENCOUNTER — Telehealth: Payer: Self-pay | Admitting: *Deleted

## 2016-01-17 ENCOUNTER — Encounter (HOSPITAL_COMMUNITY): Payer: Self-pay | Admitting: *Deleted

## 2016-01-17 DIAGNOSIS — Y9301 Activity, walking, marching and hiking: Secondary | ICD-10-CM | POA: Diagnosis not present

## 2016-01-17 DIAGNOSIS — Z7982 Long term (current) use of aspirin: Secondary | ICD-10-CM | POA: Diagnosis not present

## 2016-01-17 DIAGNOSIS — M25552 Pain in left hip: Secondary | ICD-10-CM | POA: Diagnosis not present

## 2016-01-17 DIAGNOSIS — S8991XA Unspecified injury of right lower leg, initial encounter: Secondary | ICD-10-CM | POA: Diagnosis not present

## 2016-01-17 DIAGNOSIS — M79632 Pain in left forearm: Secondary | ICD-10-CM | POA: Diagnosis not present

## 2016-01-17 DIAGNOSIS — S8992XA Unspecified injury of left lower leg, initial encounter: Secondary | ICD-10-CM | POA: Diagnosis not present

## 2016-01-17 DIAGNOSIS — S59912A Unspecified injury of left forearm, initial encounter: Secondary | ICD-10-CM | POA: Insufficient documentation

## 2016-01-17 DIAGNOSIS — M25561 Pain in right knee: Secondary | ICD-10-CM | POA: Diagnosis not present

## 2016-01-17 DIAGNOSIS — Y92002 Bathroom of unspecified non-institutional (private) residence single-family (private) house as the place of occurrence of the external cause: Secondary | ICD-10-CM | POA: Diagnosis not present

## 2016-01-17 DIAGNOSIS — S59902A Unspecified injury of left elbow, initial encounter: Secondary | ICD-10-CM | POA: Diagnosis not present

## 2016-01-17 DIAGNOSIS — Z85828 Personal history of other malignant neoplasm of skin: Secondary | ICD-10-CM | POA: Insufficient documentation

## 2016-01-17 DIAGNOSIS — S79912A Unspecified injury of left hip, initial encounter: Secondary | ICD-10-CM | POA: Diagnosis not present

## 2016-01-17 DIAGNOSIS — S79911A Unspecified injury of right hip, initial encounter: Secondary | ICD-10-CM | POA: Diagnosis not present

## 2016-01-17 DIAGNOSIS — M25551 Pain in right hip: Secondary | ICD-10-CM | POA: Diagnosis not present

## 2016-01-17 DIAGNOSIS — M25522 Pain in left elbow: Secondary | ICD-10-CM | POA: Diagnosis not present

## 2016-01-17 DIAGNOSIS — W1830XA Fall on same level, unspecified, initial encounter: Secondary | ICD-10-CM | POA: Insufficient documentation

## 2016-01-17 DIAGNOSIS — W19XXXA Unspecified fall, initial encounter: Secondary | ICD-10-CM

## 2016-01-17 DIAGNOSIS — Z79899 Other long term (current) drug therapy: Secondary | ICD-10-CM | POA: Insufficient documentation

## 2016-01-17 DIAGNOSIS — I2510 Atherosclerotic heart disease of native coronary artery without angina pectoris: Secondary | ICD-10-CM | POA: Insufficient documentation

## 2016-01-17 DIAGNOSIS — Z87891 Personal history of nicotine dependence: Secondary | ICD-10-CM | POA: Diagnosis not present

## 2016-01-17 DIAGNOSIS — Y999 Unspecified external cause status: Secondary | ICD-10-CM | POA: Insufficient documentation

## 2016-01-17 DIAGNOSIS — T148XXA Other injury of unspecified body region, initial encounter: Secondary | ICD-10-CM | POA: Diagnosis not present

## 2016-01-17 DIAGNOSIS — M79603 Pain in arm, unspecified: Secondary | ICD-10-CM | POA: Diagnosis not present

## 2016-01-17 MED ORDER — HYDROCHLOROTHIAZIDE 25 MG PO TABS
25.0000 mg | ORAL_TABLET | Freq: Every day | ORAL | Status: DC
  Administered 2016-01-17: 25 mg via ORAL
  Filled 2016-01-17: qty 1

## 2016-01-17 MED ORDER — LISINOPRIL 40 MG PO TABS
40.0000 mg | ORAL_TABLET | Freq: Every day | ORAL | Status: DC
  Administered 2016-01-17: 40 mg via ORAL
  Filled 2016-01-17: qty 1

## 2016-01-17 MED ORDER — ACETAMINOPHEN 325 MG PO TABS
325.0000 mg | ORAL_TABLET | Freq: Once | ORAL | Status: AC
  Administered 2016-01-17: 325 mg via ORAL
  Filled 2016-01-17: qty 1

## 2016-01-17 MED ORDER — LISINOPRIL-HYDROCHLOROTHIAZIDE 20-12.5 MG PO TABS: 2.0000 | ORAL_TABLET | Freq: Every morning | ORAL | Status: DC

## 2016-01-17 MED ORDER — PANTOPRAZOLE SODIUM 40 MG PO TBEC
40.0000 mg | DELAYED_RELEASE_TABLET | Freq: Every day | ORAL | Status: DC
  Administered 2016-01-17: 40 mg via ORAL
  Filled 2016-01-17: qty 1

## 2016-01-17 MED ORDER — ATORVASTATIN CALCIUM 10 MG PO TABS
10.0000 mg | ORAL_TABLET | Freq: Every day | ORAL | Status: DC
  Administered 2016-01-17: 10 mg via ORAL
  Filled 2016-01-17: qty 1

## 2016-01-17 MED ORDER — ASPIRIN 81 MG PO CHEW
81.0000 mg | CHEWABLE_TABLET | Freq: Every day | ORAL | Status: DC
  Administered 2016-01-17: 81 mg via ORAL
  Filled 2016-01-17: qty 1

## 2016-01-17 MED ORDER — FINASTERIDE 5 MG PO TABS
5.0000 mg | ORAL_TABLET | Freq: Every day | ORAL | Status: DC
  Administered 2016-01-17: 5 mg via ORAL
  Filled 2016-01-17: qty 1

## 2016-01-17 MED ORDER — ACETAMINOPHEN 325 MG PO TABS
650.0000 mg | ORAL_TABLET | Freq: Once | ORAL | Status: AC
  Administered 2016-01-17: 650 mg via ORAL
  Filled 2016-01-17: qty 2

## 2016-01-17 MED ORDER — OXYCODONE HCL 5 MG PO TABS
5.0000 mg | ORAL_TABLET | Freq: Once | ORAL | Status: DC
  Filled 2016-01-17: qty 1

## 2016-01-17 MED ORDER — METOPROLOL TARTRATE 25 MG PO TABS
100.0000 mg | ORAL_TABLET | Freq: Two times a day (BID) | ORAL | Status: DC
  Administered 2016-01-17: 100 mg via ORAL
  Filled 2016-01-17: qty 4

## 2016-01-17 MED ORDER — AMLODIPINE BESYLATE 5 MG PO TABS
5.0000 mg | ORAL_TABLET | Freq: Every day | ORAL | Status: DC
  Administered 2016-01-17: 5 mg via ORAL
  Filled 2016-01-17: qty 1

## 2016-01-17 NOTE — Progress Notes (Signed)
CSW spoke with patient and patients family regarding discharge plans. Patient and family are wanting to go home with home health. Patient currently uses services at [facility]. Patients family has questions regarding long-term care/ insurance/ Medicaid. CSW answered these questions to the best of her ability. CSW updated RN and EDP.  Kingsley Spittle, LCSWA Clinical Social Worker [phone]

## 2016-01-17 NOTE — ED Notes (Signed)
Bed: PG98 Expected date:  Expected time:  Means of arrival:  Comments: Fall, arm pain

## 2016-01-17 NOTE — ED Notes (Signed)
Pt assisted up to wheelchair with 2 assist; pt taken to BR via WC; pt c/o bilateral leg, lower back and hip pain; pt states that this is new pain and he is unable to go home like this; pt assisted back to wheelchair and taken to room 9; Dr Regenia Skeeter made aware

## 2016-01-17 NOTE — ED Notes (Signed)
Pt in xray

## 2016-01-17 NOTE — Progress Notes (Addendum)
   01/17/16 0000  CM Assessment  Expected Discharge [facility]  In-house Referral Clinical Social Work  Discharge Planning Services CM Consult  [facility] Choice Home Health  Choice offered to / list presented to  Patient;Adult Children  DME Arranged N/A  DME [facility] (now [facility])  [place] Arranged RN;PT;Nurse's [place] (now [facility])  Status of Service Completed, signed off  Discharge [place]   ED CM noted CM consult for Home health needs ED CM spoke with pt, daughters x 2 marta ( at bedside) and [name] (via [employer]) about pt options after reviewing EPIC encounter notes to confirm pt used [facility] in past for home health services Had Andee Poles as PT previously  Pt alert and oriented to person, place and time Pt wants to return home with home services Reports has [facility] services on [place] on Tuesday and Friday that the pt leaves home and goes to during the day Santa Genera is the SW at this facility Reports no services at night. Family are now agreeing to assist pt at night Pt has VA benefits at [facility] confirms pt is not service connected  CM reviewed in details medicare guidelines, Choices of home health [facility]) (length of stay in home, types of [facility] staff available, coverage, primary caregiver, up to 24 hrs before services may be started) and choices of Private duty nursing (PDN-coverage, length of stay in the home types of staff available). CM reviewed availability of [facility] SW to assist pcp to get pt to snf (if desired disposition) from the community level. CM provided pt/family with a list of [place] home health agencies and PDN.   Discussed pt to be further evaluated by unit therapists (PT/OT) for recommendation of level of care and share this with attending MD and unit CM   assisted living (ASL- coverage, services offered) and Skilled nursing  facilities (snf- coverage and services offered)  1155 ED Cm left a voice message for [place] team SW at [facility] [phone] as instructed by the [facility] receptionist fax [phone] alternative Carmine Savoy [phone] NO extension found by receptionist  1223 CM spoke with Arlyn Leak to provide referral for [facility] [place], aide  [phone] Family shared that the [facility] ([facility] [place]) [place]  [phone]  1300 Spoke with TIm who confirms [place] and [facility] services will be separate after CM received a call to state family was informed they could not have [place] and ACE services together  1317 pt and family updated

## 2016-01-17 NOTE — ED Triage Notes (Signed)
Pt arrives to the ER from home via EMS; pt fell attempting to get back in bed from the bathroom; pt was unable to get up out of the floor; EMS had to break into pt's home to get to patient; pt denies LOC; pt denies neck or back pain; pt c/o left arm pain to elbow area; pt able to move arm; no obvious injury or deformity noted; pt primary language is [place] but pt does speak [place]; Pt called daughter prior to transport via EMS

## 2016-01-17 NOTE — ED Provider Notes (Addendum)
[facility] DEPT Provider Note   CSN: [id number] Arrival date & time: 01/17/16  0445     History   Chief Complaint Chief Complaint  Patient presents with  . Fall    HPI [name] [name] is a 83 y.o. male.  HPI  83 year old male presents after a fall out of bed. Patient was trying to transfer from the bed to his wheelchair but missed the wheelchair and landed on the floor. Complaining of diffuse forearm and elbow pain on the left. No shoulder or upper arm pain or wrist/hand pain. No other injuries including no head injury, hip pain or injury, or chest/abdominal/back pain. No loss of consciousness. No dizziness or lightheadedness. Pain is 7/10. Not on blood thinners.  Past Medical History:  Diagnosis Date  . Anginal pain ([place])   . Arthritis    "all over"  . Coronary artery disease   . Depression ~ 79; ~ 1990  . GERD (gastroesophageal reflux disease)   . High cholesterol   . Hypertension   . Hyperthyroidism   . Neuropathy ([place])   . Osteoporosis   . Skin cancer of nose     Patient Active Problem List   Diagnosis Date Noted  . PTSD (post-traumatic stress disorder)   . Vomiting and diarrhea 11/09/2014  . Unstable angina ([place]) 05/10/2014  . Coronary artery disease due to lipid rich plaque   . Angina pectoris, crescendo ([place]) 04/28/2014  . Contusion of left hip 07/04/2013  . Fall 07/04/2013  . BPH (benign prostatic hypertrophy) 05/31/2013  . CAD (coronary artery disease) 05/31/2013  . UTI (urinary tract infection) 05/31/2013  . Hyperlipidemia 05/31/2013  . Dysphagia, pharyngoesophageal phase 04/10/2013  . Frequent falls 04/10/2013  . Weakness 04/10/2013  . Hyperthyroidism, subclinical 04/10/2013  . GERD (gastroesophageal reflux disease) 04/06/2013  . Bladder outflow obstruction 04/06/2013  . Dehydration 04/05/2013  . Hypertension 04/05/2013  . Arthritis 04/05/2013  . Peripheral neuropathy ([place]) 04/05/2013  . Gastroenteritis, acute 04/05/2013  . Gastroenteritis  04/05/2013    Past Surgical History:  Procedure Laterality Date  . BACK SURGERY    . CARDIAC CATHETERIZATION  09/2007  . CARDIAC CATHETERIZATION N/A 05/08/2014   Procedure: CORONARY STENT INTERVENTION;  Surgeon: [name], MD; 3.518 mm Xience [place] DES to the RCA   . LEFT HEART CATHETERIZATION WITH CORONARY ANGIOGRAM N/A 05/08/2014   Procedure: LEFT HEART CATHETERIZATION WITH CORONARY ANGIOGRAM;  Surgeon: [name], MD; LAD 100% (old), CFX 20%, OM1 30%, pRCA 30%, dRCA 95%, EF 55%     . LUMBAR LAMINECTOMY/DECOMPRESSION MICRODISCECTOMY  2011       Home Medications    Prior to Admission medications   Medication Sig Start Date End Date Taking? Authorizing Provider  acetaminophen (TYLENOL) 500 MG tablet Take 1,000 mg by mouth every 6 (six) hours as needed (pain).   Yes Historical Provider, MD  amLODipine (NORVASC) 10 MG tablet Take 5 mg by mouth daily.    Yes Historical Provider, MD  aspirin 81 MG tablet Take 1 tablet (81 mg total) by mouth daily. 11/15/14  Yes Mihai Croitoru, MD  atorvastatin (LIPITOR) 20 MG tablet Take 10 mg by mouth daily.   Yes Historical Provider, MD  calcium-vitamin D (OSCAL WITH D) 500-200 MG-UNIT per tablet Take 1 tablet by mouth 2 (two) times daily.    Yes Historical Provider, MD  Cyanocobalamin (B-12 COMPLIANCE INJECTION IJ) Inject as directed every 30 (thirty) days.   Yes Historical Provider, MD  finasteride (PROSCAR) 5 MG tablet Take 5 mg by mouth  daily.   Yes Historical Provider, MD  isosorbide mononitrate (IMDUR) 60 MG 24 hr tablet Take 0.5 tablets (30 mg total) by mouth every evening. 12/02/15  Yes Rhonda G Barrett, PA-C  lisinopril-hydrochlorothiazide (PRINZIDE,ZESTORETIC) 20-12.5 MG per tablet Take 2 tablets by mouth every morning.    Yes Historical Provider, MD  Melatonin 1 MG TABS Take 1 tablet by mouth every evening.   Yes Historical Provider, MD  metoprolol (LOPRESSOR) 100 MG tablet Take 100 mg by mouth 2 (two) times daily.   Yes Historical  Provider, MD  Omega-3 Fatty Acids (FISH OIL) 1200 MG CAPS Take 1 capsule by mouth daily.   Yes Historical Provider, MD  pantoprazole (PROTONIX) 40 MG tablet Take 1 tablet (40 mg total) by mouth daily. 05/11/14  Yes Rhonda G Barrett, PA-C  tamsulosin (FLOMAX) 0.4 MG CAPS capsule Take 1 capsule (0.4 mg total) by mouth daily after supper. 12/02/15  Yes Rhonda G Barrett, PA-C  Tetrahydrozoline HCl (EYE DROPS OP) Apply 1 drop to eye daily as needed (dry eyes).    Yes Historical Provider, MD  traZODone (DESYREL) 50 MG tablet Take 50 mg by mouth at bedtime.    Yes Historical Provider, MD    Family History Family History  Problem Relation Age of Onset  . CAD Mother   . Asthma Mother     Social History Social History  Substance Use Topics  . Smoking status: Former Smoker    Packs/day: 1.00    Years: 20.00    Types: Cigarettes    Quit date: 01/12/1962  . Smokeless tobacco: Never Used  . Alcohol use No     Comment: 05/08/2014 "he's been in [place] for ~!35 yrs"     Allergies   Cortisone   Review of Systems Review of Systems  Cardiovascular: Negative for chest pain.  Gastrointestinal: Negative for abdominal pain.  Musculoskeletal: Positive for arthralgias. Negative for back pain.  Neurological: Negative for dizziness, weakness, light-headedness, numbness and headaches.  All other systems reviewed and are negative.    Physical Exam Updated Vital Signs BP 149/85   Pulse 91   Temp 98.9 F (37.2 C) (Oral)   Resp 20   SpO2 94%   Physical Exam  Constitutional: He is oriented to person, place, and time. He appears well-developed and well-nourished.  HENT:  Head: Normocephalic and atraumatic.  Right Ear: External ear normal.  Left Ear: External ear normal.  Nose: Nose normal.  Eyes: Right eye exhibits no discharge. Left eye exhibits no discharge.  Neck: Neck supple.  Cardiovascular: Normal rate and regular rhythm.   Pulses:      Radial pulses are 2+ on the left side.   Pulmonary/Chest: Effort normal and breath sounds normal.  Abdominal: Soft. He exhibits no distension. There is no tenderness.  Musculoskeletal: He exhibits no edema.       Left shoulder: He exhibits no tenderness.       Left elbow: He exhibits normal range of motion and no swelling. Tenderness found.       Left wrist: He exhibits normal range of motion and no tenderness.       Right hip: He exhibits normal range of motion and no tenderness.       Left hip: He exhibits normal range of motion and no tenderness.       Left upper arm: He exhibits no tenderness.       Left forearm: He exhibits tenderness. He exhibits no swelling and no deformity. Bony tenderness: proximal, mild, hard  to localize.       Left hand: He exhibits normal range of motion and no tenderness.  Normal radial, ulnar and median nerve function in LUE. Normal gross sensation and strength  Neurological: He is alert and oriented to person, place, and time.  Skin: Skin is warm and dry.  Nursing note and vitals reviewed.    ED Treatments / Results  Labs (all labs ordered are listed, but only abnormal results are displayed) Labs Reviewed - No data to display  EKG  EKG Interpretation None       Radiology Dg Elbow Complete Left  Result Date: 01/17/2016 CLINICAL DATA:  Left upper extremity pain after falling while walking back from the bathroom tonight EXAM: LEFT ELBOW - COMPLETE 3+ VIEW COMPARISON:  None. FINDINGS: There is no evidence of fracture, dislocation, or joint effusion. There is no evidence of arthropathy or other focal bone abnormality. Soft tissues are unremarkable. IMPRESSION: Negative. Electronically Signed   By: Andreas Newport M.D.   On: 01/17/2016 05:32   Dg Forearm Left  Result Date: 01/17/2016 CLINICAL DATA:  Left upper extremity pain after falling while returning from the bathroom tonight EXAM: LEFT FOREARM - 2 VIEW COMPARISON:  None. FINDINGS: There is no evidence of fracture or other focal bone  lesions. Soft tissues are unremarkable. IMPRESSION: Negative. Electronically Signed   By: Andreas Newport M.D.   On: 01/17/2016 05:32    Procedures Procedures (including critical care time)  Medications Ordered in ED Medications  acetaminophen (TYLENOL) tablet 650 mg (650 mg Oral Given 01/17/16 0601)     Initial Impression / Assessment and Plan / ED Course  I have reviewed the triage vital signs and the nursing notes.  Pertinent labs & imaging results that were available during my care of the patient were reviewed by me and considered in my medical decision making (see chart for details).  Clinical Course as of Jan 16 599  Fri Jan 17, 2016  0510 Xrays, tylenol, appears NV intact.  [SG]    Clinical Course User Index [SG] Sherwood Gambler, MD    X-rays are negative. Neurovascularly intact. Appears to be a mechanical fall with no signs of syncope or near-syncope. No head injury. Ice, Tylenol as needed. He will be staying with daughter as he recovers. F/u with PCP, discussed return precautions.   6:23 AM while getting ready to leave, patient tried to stand up and pivot to wheelchair. Had sudden pain at bilateral knees, could not bear weight. Now that he's sitting down he feels fine. No tenderness in hips/knees/legs during exam. Will get hip/knee xrays.   7:30 AM Care transferred to Dr Tyrone Nine with [facility] pending  Final Clinical Impressions(s) / ED Diagnoses   Final diagnoses:  Fall, initial encounter  Soft tissue injury of left forearm, initial encounter    New Prescriptions New Prescriptions   No medications on file     Sherwood Gambler, MD 01/17/16 6761    Sherwood Gambler, MD 01/17/16 1256

## 2016-01-20 DIAGNOSIS — R1314 Dysphagia, pharyngoesophageal phase: Secondary | ICD-10-CM | POA: Diagnosis not present

## 2016-01-20 DIAGNOSIS — K219 Gastro-esophageal reflux disease without esophagitis: Secondary | ICD-10-CM | POA: Diagnosis not present

## 2016-01-20 DIAGNOSIS — I10 Essential (primary) hypertension: Secondary | ICD-10-CM | POA: Diagnosis not present

## 2016-01-20 DIAGNOSIS — I2510 Atherosclerotic heart disease of native coronary artery without angina pectoris: Secondary | ICD-10-CM | POA: Diagnosis not present

## 2016-01-20 DIAGNOSIS — R296 Repeated falls: Secondary | ICD-10-CM | POA: Diagnosis not present

## 2016-01-20 DIAGNOSIS — Z9181 History of falling: Secondary | ICD-10-CM | POA: Diagnosis not present

## 2016-01-20 DIAGNOSIS — Z85828 Personal history of other malignant neoplasm of skin: Secondary | ICD-10-CM | POA: Diagnosis not present

## 2016-01-20 DIAGNOSIS — M810 Age-related osteoporosis without current pathological fracture: Secondary | ICD-10-CM | POA: Diagnosis not present

## 2016-01-20 DIAGNOSIS — E785 Hyperlipidemia, unspecified: Secondary | ICD-10-CM | POA: Diagnosis not present

## 2016-01-20 DIAGNOSIS — F329 Major depressive disorder, single episode, unspecified: Secondary | ICD-10-CM | POA: Diagnosis not present

## 2016-01-20 DIAGNOSIS — G629 Polyneuropathy, unspecified: Secondary | ICD-10-CM | POA: Diagnosis not present

## 2016-01-20 DIAGNOSIS — M1990 Unspecified osteoarthritis, unspecified site: Secondary | ICD-10-CM | POA: Diagnosis not present

## 2016-01-20 DIAGNOSIS — Z87891 Personal history of nicotine dependence: Secondary | ICD-10-CM | POA: Diagnosis not present

## 2016-01-21 DIAGNOSIS — M1990 Unspecified osteoarthritis, unspecified site: Secondary | ICD-10-CM | POA: Diagnosis not present

## 2016-01-21 DIAGNOSIS — R296 Repeated falls: Secondary | ICD-10-CM | POA: Diagnosis not present

## 2016-01-21 DIAGNOSIS — M810 Age-related osteoporosis without current pathological fracture: Secondary | ICD-10-CM | POA: Diagnosis not present

## 2016-01-21 DIAGNOSIS — G629 Polyneuropathy, unspecified: Secondary | ICD-10-CM | POA: Diagnosis not present

## 2016-01-21 DIAGNOSIS — I2510 Atherosclerotic heart disease of native coronary artery without angina pectoris: Secondary | ICD-10-CM | POA: Diagnosis not present

## 2016-01-21 DIAGNOSIS — R1314 Dysphagia, pharyngoesophageal phase: Secondary | ICD-10-CM | POA: Diagnosis not present

## 2016-01-22 DIAGNOSIS — G629 Polyneuropathy, unspecified: Secondary | ICD-10-CM | POA: Diagnosis not present

## 2016-01-22 DIAGNOSIS — R1314 Dysphagia, pharyngoesophageal phase: Secondary | ICD-10-CM | POA: Diagnosis not present

## 2016-01-22 DIAGNOSIS — M1990 Unspecified osteoarthritis, unspecified site: Secondary | ICD-10-CM | POA: Diagnosis not present

## 2016-01-22 DIAGNOSIS — M810 Age-related osteoporosis without current pathological fracture: Secondary | ICD-10-CM | POA: Diagnosis not present

## 2016-01-22 DIAGNOSIS — R296 Repeated falls: Secondary | ICD-10-CM | POA: Diagnosis not present

## 2016-01-22 DIAGNOSIS — I2510 Atherosclerotic heart disease of native coronary artery without angina pectoris: Secondary | ICD-10-CM | POA: Diagnosis not present

## 2016-01-24 DIAGNOSIS — G629 Polyneuropathy, unspecified: Secondary | ICD-10-CM | POA: Diagnosis not present

## 2016-01-24 DIAGNOSIS — I2510 Atherosclerotic heart disease of native coronary artery without angina pectoris: Secondary | ICD-10-CM | POA: Diagnosis not present

## 2016-01-24 DIAGNOSIS — R296 Repeated falls: Secondary | ICD-10-CM | POA: Diagnosis not present

## 2016-01-24 DIAGNOSIS — M1990 Unspecified osteoarthritis, unspecified site: Secondary | ICD-10-CM | POA: Diagnosis not present

## 2016-01-24 DIAGNOSIS — R1314 Dysphagia, pharyngoesophageal phase: Secondary | ICD-10-CM | POA: Diagnosis not present

## 2016-01-24 DIAGNOSIS — M810 Age-related osteoporosis without current pathological fracture: Secondary | ICD-10-CM | POA: Diagnosis not present

## 2016-02-03 DIAGNOSIS — R296 Repeated falls: Secondary | ICD-10-CM | POA: Diagnosis not present

## 2016-02-03 DIAGNOSIS — M810 Age-related osteoporosis without current pathological fracture: Secondary | ICD-10-CM | POA: Diagnosis not present

## 2016-02-03 DIAGNOSIS — I2510 Atherosclerotic heart disease of native coronary artery without angina pectoris: Secondary | ICD-10-CM | POA: Diagnosis not present

## 2016-02-03 DIAGNOSIS — G629 Polyneuropathy, unspecified: Secondary | ICD-10-CM | POA: Diagnosis not present

## 2016-02-03 DIAGNOSIS — R1314 Dysphagia, pharyngoesophageal phase: Secondary | ICD-10-CM | POA: Diagnosis not present

## 2016-02-03 DIAGNOSIS — M1990 Unspecified osteoarthritis, unspecified site: Secondary | ICD-10-CM | POA: Diagnosis not present

## 2016-02-05 DIAGNOSIS — M1990 Unspecified osteoarthritis, unspecified site: Secondary | ICD-10-CM | POA: Diagnosis not present

## 2016-02-05 DIAGNOSIS — G629 Polyneuropathy, unspecified: Secondary | ICD-10-CM | POA: Diagnosis not present

## 2016-02-05 DIAGNOSIS — R296 Repeated falls: Secondary | ICD-10-CM | POA: Diagnosis not present

## 2016-02-05 DIAGNOSIS — I2510 Atherosclerotic heart disease of native coronary artery without angina pectoris: Secondary | ICD-10-CM | POA: Diagnosis not present

## 2016-02-05 DIAGNOSIS — M810 Age-related osteoporosis without current pathological fracture: Secondary | ICD-10-CM | POA: Diagnosis not present

## 2016-02-05 DIAGNOSIS — R1314 Dysphagia, pharyngoesophageal phase: Secondary | ICD-10-CM | POA: Diagnosis not present

## 2016-02-10 ENCOUNTER — Telehealth: Payer: Self-pay | Admitting: Cardiovascular Disease

## 2016-02-10 ENCOUNTER — Encounter: Payer: Self-pay | Admitting: Cardiovascular Disease

## 2016-02-10 ENCOUNTER — Ambulatory Visit (INDEPENDENT_AMBULATORY_CARE_PROVIDER_SITE_OTHER): Payer: Medicare Other | Admitting: Cardiovascular Disease

## 2016-02-10 VITALS — BP 160/74 | HR 63 | Ht 71.0 in | Wt 200.0 lb

## 2016-02-10 DIAGNOSIS — M1990 Unspecified osteoarthritis, unspecified site: Secondary | ICD-10-CM | POA: Diagnosis not present

## 2016-02-10 DIAGNOSIS — I25118 Atherosclerotic heart disease of native coronary artery with other forms of angina pectoris: Secondary | ICD-10-CM | POA: Diagnosis not present

## 2016-02-10 DIAGNOSIS — I10 Essential (primary) hypertension: Secondary | ICD-10-CM

## 2016-02-10 DIAGNOSIS — R1314 Dysphagia, pharyngoesophageal phase: Secondary | ICD-10-CM | POA: Diagnosis not present

## 2016-02-10 DIAGNOSIS — I2510 Atherosclerotic heart disease of native coronary artery without angina pectoris: Secondary | ICD-10-CM | POA: Diagnosis not present

## 2016-02-10 DIAGNOSIS — I209 Angina pectoris, unspecified: Secondary | ICD-10-CM | POA: Diagnosis not present

## 2016-02-10 DIAGNOSIS — E7800 Pure hypercholesterolemia, unspecified: Secondary | ICD-10-CM | POA: Diagnosis not present

## 2016-02-10 DIAGNOSIS — M810 Age-related osteoporosis without current pathological fracture: Secondary | ICD-10-CM | POA: Diagnosis not present

## 2016-02-10 DIAGNOSIS — G629 Polyneuropathy, unspecified: Secondary | ICD-10-CM | POA: Diagnosis not present

## 2016-02-10 DIAGNOSIS — R296 Repeated falls: Secondary | ICD-10-CM | POA: Diagnosis not present

## 2016-02-10 NOTE — Patient Instructions (Signed)
Weigh twice a week and report weights over 205 pounds.  Dr [name] recommends that you schedule a follow-up appointment in 6 months. You will receive a reminder letter in the mail two months in advance. If you don't receive a letter, please call our office to schedule the follow-up appointment.  If you need a refill on your cardiac medications before your next appointment, please call your pharmacy.  Low-Sodium Eating Plan Sodium raises blood pressure and causes water to be held in the body. Getting less sodium from food will help lower your blood pressure, reduce any swelling, and protect your heart, liver, and kidneys. We get sodium by adding salt (sodium chloride) to food. Most of our sodium comes from canned, boxed, and frozen foods. Restaurant foods, fast foods, and pizza are also very high in sodium. Even if you take medicine to lower your blood pressure or to reduce fluid in your body, getting less sodium from your food is important. What is my plan? Most people should limit their sodium intake to 2,300 mg a day. Your health care provider recommends that you limit your sodium intake to __________ a day. What do I need to know about this eating plan? For the low-sodium eating plan, you will follow these general guidelines:  Choose foods with a % Daily Value for sodium of less than 5% (as listed on the food label).  Use salt-free seasonings or herbs instead of table salt or sea salt.  Check with your health care provider or pharmacist before using salt substitutes.  Eat fresh foods.  Eat more vegetables and fruits.  Limit canned vegetables. If you do use them, rinse them well to decrease the sodium.  Limit cheese to 1 oz (28 g) per day.  Eat lower-sodium products, often labeled as "lower sodium" or "no salt added."  Avoid foods that contain monosodium glutamate (MSG). MSG is sometimes added to [place] food and some canned foods.  Check food labels (Nutrition Facts labels) on  foods to learn how much sodium is in one serving.  Eat more home-cooked food and less restaurant, buffet, and fast food.  When eating at a restaurant, ask that your food be prepared with less salt, or no salt if possible. How do I read food labels for sodium information? The Nutrition Facts label lists the amount of sodium in one serving of the food. If you eat more than one serving, you must multiply the listed amount of sodium by the number of servings. Food labels may also identify foods as:  Sodium free-Less than 5 mg in a serving.  Very low sodium-35 mg or less in a serving.  Low sodium-140 mg or less in a serving.  Light in sodium-50% less sodium in a serving. For example, if a food that usually has 300 mg of sodium is changed to become light in sodium, it will have 150 mg of sodium.  Reduced sodium-25% less sodium in a serving. For example, if a food that usually has 400 mg of sodium is changed to reduced sodium, it will have 300 mg of sodium. What foods can I eat? Grains  Low-sodium cereals, including oats, puffed wheat and rice, and shredded wheat cereals. Low-sodium crackers. Unsalted rice and pasta. Lower-sodium bread. Vegetables  Frozen or fresh vegetables. Low-sodium or reduced-sodium canned vegetables. Low-sodium or reduced-sodium tomato sauce and paste. Low-sodium or reduced-sodium tomato and vegetable juices. Fruits  Fresh, frozen, and canned fruit. Fruit juice. Meat and Other Protein Products  Low-sodium canned tuna and salmon.  Fresh or frozen meat, poultry, seafood, and fish. Lamb. Unsalted nuts. Dried beans, peas, and lentils without added salt. Unsalted canned beans. Homemade soups without salt. Eggs. Dairy  Milk. Soy milk. Ricotta cheese. Low-sodium or reduced-sodium cheeses. Yogurt. Condiments  Fresh and dried herbs and spices. Salt-free seasonings. Onion and garlic powders. Low-sodium varieties of mustard and ketchup. Fresh or refrigerated horseradish. Lemon  juice. Fats and Oils  Reduced-sodium salad dressings. Unsalted butter. Other  Unsalted popcorn and pretzels. The items listed above may not be a complete list of recommended foods or beverages. Contact your dietitian for more options.  What foods are not recommended? Grains  Instant hot cereals. Bread stuffing, pancake, and biscuit mixes. Croutons. Seasoned rice or pasta mixes. Noodle soup cups. Boxed or frozen macaroni and cheese. Self-rising flour. Regular salted crackers. Vegetables  Regular canned vegetables. Regular canned tomato sauce and paste. Regular tomato and vegetable juices. Frozen vegetables in sauces. Salted [place] fries. Olives. Angie Fava. Relishes. Sauerkraut. Salsa. Meat and Other Protein Products  Salted, canned, smoked, spiced, or pickled meats, seafood, or fish. Bacon, ham, sausage, hot dogs, corned beef, chipped beef, and packaged luncheon meats. Salt pork. Jerky. Pickled herring. Anchovies, regular canned tuna, and sardines. Salted nuts. Dairy  Processed cheese and cheese spreads. Cheese curds. Blue cheese and cottage cheese. Buttermilk. Condiments  Onion and garlic salt, seasoned salt, table salt, and sea salt. Canned and packaged gravies. Worcestershire sauce. Tartar sauce. Barbecue sauce. Teriyaki sauce. Soy sauce, including reduced sodium. Steak sauce. Fish sauce. Oyster sauce. Cocktail sauce. Horseradish that you find on the shelf. Regular ketchup and mustard. Meat flavorings and tenderizers. Bouillon cubes. Hot sauce. Tabasco sauce. Marinades. Taco seasonings. Relishes. Fats and Oils  Regular salad dressings. Salted butter. Margarine. Ghee. Bacon fat. Other  Potato and tortilla chips. Corn chips and puffs. Salted popcorn and pretzels. Canned or dried soups. Pizza. Frozen entrees and pot pies. The items listed above may not be a complete list of foods and beverages to avoid. Contact your dietitian for more information.  This information is not intended to replace  advice given to you by your health care provider. Make sure you discuss any questions you have with your health care provider. Document Released: 06/20/2001 Document Revised: 06/06/2015 Document Reviewed: 11/02/2012 Elsevier Interactive Patient Education  2017 [employer].

## 2016-02-10 NOTE — Progress Notes (Signed)
Patient ID: [name] [name], male   DOB: Aug 30, 1925, 83 y.o.   MRN: [id number]    Cardiology Office Note    Date:  02/12/2016   ID:  [name], DOB [birth year], MRN [id number]  PCP:  [name], MD  Cardiologist:   [name], MD   Chief Complaint  Patient presents with  . Follow-up CAD, 18 months s/p PCI-DES    No complaints    History of Present Illness:  [name] [name] is a 83 y.o. male with CAD, returning for follow up.  He has generally done well since his last appointment. He has been eating more and gaining weight. Has had some shortness of breath and occasional ankle edema, though neither one is obvious today. When he tried to wean off the isosorbide he did not feel as well and has restarted the medication. Still has problems with orthostatic dizziness.  He has not had angina at rest or with the limited amount of walking that he can perform. He has not had falls but has weak and wobbly legs and unsteady gait due to neuropathy. He denies dyspnea, edema, new focal neurological deficits or intermittent claudication.  Cardia cath on May 08, 2014 showed normal left ventricle systolic function and mild distal anterolateral hypokinesis. He had chronic total occlusion of the proximal LAD with distal filling via left to left and right-to-left collaterals. Mild to moderate lesions were seen in the circumflex and right coronary artery system and a new 95% stenosis in the distal right coronary artery just proximal to the ostium of the PDA with jeopardy to the LAD collaterals. He received a drug-eluting stent (3.518 mm Xience Alpine) to the right coronary artery.  He is quite sedentary due to severe foot neuropathy and is almost nonambulatory. 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     Past Medical History:  Diagnosis Date  . Anginal pain ([place])   . Arthritis    "all over"  . Coronary artery disease   . Depression ~  86; ~ 1990  . GERD (gastroesophageal reflux disease)   . High cholesterol   . Hypertension   . Hyperthyroidism   . Neuropathy ([name])   . Osteoporosis   . Skin cancer of nose     Past Surgical History:  Procedure Laterality Date  . BACK SURGERY    . CARDIAC CATHETERIZATION  09/2007  . CARDIAC CATHETERIZATION N/A 05/08/2014   Procedure: CORONARY STENT INTERVENTION;  Surgeon: [name], MD; 3.518 mm Xience Alpine DES to the RCA   . LEFT HEART CATHETERIZATION WITH CORONARY ANGIOGRAM N/A 05/08/2014   Procedure: LEFT HEART CATHETERIZATION WITH CORONARY ANGIOGRAM;  Surgeon: [name], MD; LAD 100% (old), CFX 20%, OM1 30%, pRCA 30%, dRCA 95%, EF 55%     . LUMBAR LAMINECTOMY/DECOMPRESSION MICRODISCECTOMY  2011    Outpatient Medications Prior to Visit  Medication Sig Dispense Refill  . acetaminophen (TYLENOL) 500 MG tablet Take 1,000 mg by mouth every 6 (six) hours as needed (pain).    [name] amLODipine (NORVASC) 10 MG tablet Take 5 mg by mouth daily.     [name] aspirin 81 MG tablet Take 1 tablet (81 mg total) by mouth daily. 30 tablet 11  . atorvastatin (LIPITOR) 20 MG tablet Take 10 mg by mouth daily.    . calcium-vitamin D (OSCAL WITH D) 500-200 MG-UNIT per tablet Take 1 tablet by mouth 2 (two) times daily.     [name]  Cyanocobalamin (B-12 COMPLIANCE INJECTION IJ) Inject as directed every 30 (thirty) days.    . finasteride (PROSCAR) 5 MG tablet Take 5 mg by mouth daily.    . isosorbide mononitrate (IMDUR) 60 MG 24 hr tablet Take 0.5 tablets (30 mg total) by mouth every evening. 45 tablet 3  . lisinopril-hydrochlorothiazide (PRINZIDE,ZESTORETIC) 20-12.5 MG per tablet Take 2 tablets by mouth every morning.     . Melatonin 1 MG TABS Take 3 mg by mouth every evening.     . metoprolol (LOPRESSOR) 100 MG tablet Take 100 mg by mouth 2 (two) times daily.    . Omega-3 Fatty Acids (FISH OIL) 1200 MG CAPS Take 1 capsule by mouth daily.    . pantoprazole (PROTONIX) 40 MG tablet Take 1 tablet (40 mg total)  by mouth daily. 30 tablet 11  . tamsulosin (FLOMAX) 0.4 MG CAPS capsule Take 1 capsule (0.4 mg total) by mouth daily after supper. 30 capsule 0  . Tetrahydrozoline HCl (EYE DROPS OP) Apply 1 drop to eye daily as needed (dry eyes).     . traZODone (DESYREL) 50 MG tablet Take 50 mg by mouth at bedtime.      No facility-administered medications prior to visit.      Allergies:   Cortisone   Social History   Social History  . Marital status: Divorced    Spouse name: N/A  . Number of children: N/A  . Years of education: N/A   Social History Main Topics  . Smoking status: Former Smoker    Packs/day: 1.00    Years: 20.00    Types: Cigarettes    Quit date: 01/12/1962  . Smokeless tobacco: Never Used  . Alcohol use No     Comment: 05/08/2014 "he's been in [place] for ~!35 yrs"  . Drug use: No  . Sexual activity: Not Asked   Other Topics Concern  . None   Social History Narrative   He was in [name] a child. He emigrated to the [place] after World War II. He was staying with his host family in [place] when he got drafted for [place].      He now lives in [place] and his daughter helps in his care.       ROS:   Please see the history of present illness.    ROS All other systems reviewed and are negative.   PHYSICAL EXAM:   VS:  BP (!) 160/74 (BP Location: Right Arm, Patient Position: Sitting, Cuff Size: Normal)   Pulse 63   Ht 5\' 11"  (1.803 m)   Wt 90.7 kg (200 lb)   SpO2 95%   BMI 27.89 kg/m    rechecked his blood pressure 150/68 mmHg  GEN: Well nourished, well developed, in no acute distress  HEENT: normal  Neck: no JVD, carotid bruits, or masses Cardiac: RRR; no murmurs, rubs, or gallops,no edema  Respiratory:  clear to auscultation bilaterally, normal work of breathing GI: soft, nontender, nondistended, + BS MS: no deformity or atrophy  Skin: warm and dry, no rash Neuro:  Alert and Oriented x 3, Strength and sensation are intact Psych: euthymic  mood, full affect  Wt Readings from Last 3 Encounters:  02/10/16 90.7 kg (200 lb)  12/02/15 91.4 kg (201 lb 6.4 oz)  10/09/15 89.9 kg (198 lb 3.2 oz)      Studies/Labs Reviewed:   EKG:  EKG is not ordered today.   Lipid Panel    Component Value Date/Time  CHOL 111 05/10/2014 1015   TRIG 60 05/10/2014 1015   HDL 30 (L) 05/10/2014 1015   CHOLHDL 3.7 05/10/2014 1015   VLDL 12 05/10/2014 1015   LDLCALC 69 05/10/2014 1015   he reports more recent labs were performed at the [facility]:    No diagnosis found.   PLAN:  In order of problems listed above:  1. CAD: Asymptomatic. Angina is controlled on high doses of 3 antianginal medications. Efforts to wean the isosorbide were not successful. Has chronic occlusion of the LAD artery and a stent placed in the right coronary artery almost 2 years ago.  2. HTN: Blood pressure control is not perfect, but is probably acceptable. Would like to avoid aggressive reduction in systolic blood pressure in this gentleman with neuropathy and unsteady gait. Continue same medications. We tried to wean off his isosorbide and he felt poorly, probably since he developed angina. He is also taking amlodipine and metoprolol and high doses as antianginals. Try to limit his sodium intake a bit more. He has not had overt congestive heart failure but his LVEDP was elevated at the time of his cardiac catheterization. 3. HLP: When last checked LDL was at target under 70    Medication Adjustments/Labs and Tests Ordered: Current medicines are reviewed at length with the patient today.  Concerns regarding medicines are outlined above.  Medication changes, Labs and Tests ordered today are listed in the Patient Instructions below. Patient Instructions  Weigh twice a week and report weights over 205 pounds.  Dr [name] recommends that you schedule a follow-up appointment in 6 months. You will receive a reminder letter in the mail two months in  advance. If you don't receive a letter, please call our office to schedule the follow-up appointment.  If you need a refill on your cardiac medications before your next appointment, please call your pharmacy.  Low-Sodium Eating Plan Sodium raises blood pressure and causes water to be held in the body. Getting less sodium from food will help lower your blood pressure, reduce any swelling, and protect your heart, liver, and kidneys. We get sodium by adding salt (sodium chloride) to food. Most of our sodium comes from canned, boxed, and frozen foods. Restaurant foods, fast foods, and pizza are also very high in sodium. Even if you take medicine to lower your blood pressure or to reduce fluid in your body, getting less sodium from your food is important. What is my plan? Most people should limit their sodium intake to 2,300 mg a day. Your health care provider recommends that you limit your sodium intake to __________ a day. What do I need to know about this eating plan? For the low-sodium eating plan, you will follow these general guidelines:  Choose foods with a % Daily Value for sodium of less than 5% (as listed on the food label).  Use salt-free seasonings or herbs instead of table salt or sea salt.  Check with your health care provider or pharmacist before using salt substitutes.  Eat fresh foods.  Eat more vegetables and fruits.  Limit canned vegetables. If you do use them, rinse them well to decrease the sodium.  Limit cheese to 1 oz (28 g) per day.  Eat lower-sodium products, often labeled as "lower sodium" or "no salt added."  Avoid foods that contain monosodium glutamate (MSG). MSG is sometimes added to [place] food and some canned foods.  Check food labels (Nutrition Facts labels) on foods to learn how much  sodium is in one serving.  Eat more home-cooked food and less restaurant, buffet, and fast food.  When eating at a restaurant, ask that your food be prepared with less salt,  or no salt if possible. How do I read food labels for sodium information? The Nutrition Facts label lists the amount of sodium in one serving of the food. If you eat more than one serving, you must multiply the listed amount of sodium by the number of servings. Food labels may also identify foods as:  Sodium free-Less than 5 mg in a serving.  Very low sodium-35 mg or less in a serving.  Low sodium-140 mg or less in a serving.  Light in sodium-50% less sodium in a serving. For example, if a food that usually has 300 mg of sodium is changed to become light in sodium, it will have 150 mg of sodium.  Reduced sodium-25% less sodium in a serving. For example, if a food that usually has 400 mg of sodium is changed to reduced sodium, it will have 300 mg of sodium. What foods can I eat? Grains  Low-sodium cereals, including oats, puffed wheat and rice, and shredded wheat cereals. Low-sodium crackers. Unsalted rice and pasta. Lower-sodium bread. Vegetables  Frozen or fresh vegetables. Low-sodium or reduced-sodium canned vegetables. Low-sodium or reduced-sodium tomato sauce and paste. Low-sodium or reduced-sodium tomato and vegetable juices. Fruits  Fresh, frozen, and canned fruit. Fruit juice. Meat and Other Protein Products  Low-sodium canned tuna and salmon. Fresh or frozen meat, poultry, seafood, and fish. Lamb. Unsalted nuts. Dried beans, peas, and lentils without added salt. Unsalted canned beans. Homemade soups without salt. Eggs. Dairy  Milk. Soy milk. Ricotta cheese. Low-sodium or reduced-sodium cheeses. Yogurt. Condiments  Fresh and dried herbs and spices. Salt-free seasonings. Onion and garlic powders. Low-sodium varieties of mustard and ketchup. Fresh or refrigerated horseradish. Lemon juice. Fats and Oils  Reduced-sodium salad dressings. Unsalted butter. Other  Unsalted popcorn and pretzels. The items listed above may not be a complete list of recommended foods or beverages. Contact  your dietitian for more options.  What foods are not recommended? Grains  Instant hot cereals. Bread stuffing, pancake, and biscuit mixes. Croutons. Seasoned rice or pasta mixes. Noodle soup cups. Boxed or frozen macaroni and cheese. Self-rising flour. Regular salted crackers. Vegetables  Regular canned vegetables. Regular canned tomato sauce and paste. Regular tomato and vegetable juices. Frozen vegetables in sauces. Salted [place] fries. Olives. Angie Fava. Relishes. Sauerkraut. Salsa. Meat and Other Protein Products  Salted, canned, smoked, spiced, or pickled meats, seafood, or fish. Bacon, ham, sausage, hot dogs, corned beef, chipped beef, and packaged luncheon meats. Salt pork. Jerky. Pickled herring. Anchovies, regular canned tuna, and sardines. Salted nuts. Dairy  Processed cheese and cheese spreads. Cheese curds. Blue cheese and cottage cheese. Buttermilk. Condiments  Onion and garlic salt, seasoned salt, table salt, and sea salt. Canned and packaged gravies. Worcestershire sauce. Tartar sauce. Barbecue sauce. Teriyaki sauce. Soy sauce, including reduced sodium. Steak sauce. Fish sauce. Oyster sauce. Cocktail sauce. Horseradish that you find on the shelf. Regular ketchup and mustard. Meat flavorings and tenderizers. Bouillon cubes. Hot sauce. Tabasco sauce. Marinades. Taco seasonings. Relishes. Fats and Oils  Regular salad dressings. Salted butter. Margarine. Ghee. Bacon fat. Other  Potato and tortilla chips. Corn chips and puffs. Salted popcorn and pretzels. Canned or dried soups. Pizza. Frozen entrees and pot pies. The items listed above may not be a complete list of foods and beverages to avoid. Contact your dietitian for  more information.  This information is not intended to replace advice given to you by your health care provider. Make sure you discuss any questions you have with your health care provider. Document Released: 06/20/2001 Document Revised: 06/06/2015 Document Reviewed:  11/02/2012 Elsevier Interactive Patient Education  2017 [place], [name], MD  02/12/2016 4:33 PM    [facility] [address], [place], [place]  [address] Phone: [phone]; Fax: [phone]

## 2016-02-10 NOTE — Telephone Encounter (Signed)
Patient seen today. Request review/addendum to today's visit notes to reflect patient's actual heigh (5'11") (call if questions)

## 2016-02-10 NOTE — Telephone Encounter (Signed)
New Message  Pt daughter call stating pt height is incorrect in the  system. She stated pt is wheel chair bound, but height of pt is 5'11. Please call back to discuss if needed.

## 2016-02-11 NOTE — Telephone Encounter (Signed)
Record updated

## 2016-02-12 DIAGNOSIS — G629 Polyneuropathy, unspecified: Secondary | ICD-10-CM | POA: Diagnosis not present

## 2016-02-12 DIAGNOSIS — M1990 Unspecified osteoarthritis, unspecified site: Secondary | ICD-10-CM | POA: Diagnosis not present

## 2016-02-12 DIAGNOSIS — R296 Repeated falls: Secondary | ICD-10-CM | POA: Diagnosis not present

## 2016-02-12 DIAGNOSIS — I2510 Atherosclerotic heart disease of native coronary artery without angina pectoris: Secondary | ICD-10-CM | POA: Diagnosis not present

## 2016-02-12 DIAGNOSIS — R1314 Dysphagia, pharyngoesophageal phase: Secondary | ICD-10-CM | POA: Diagnosis not present

## 2016-02-12 DIAGNOSIS — M810 Age-related osteoporosis without current pathological fracture: Secondary | ICD-10-CM | POA: Diagnosis not present

## 2016-02-17 DIAGNOSIS — R1314 Dysphagia, pharyngoesophageal phase: Secondary | ICD-10-CM | POA: Diagnosis not present

## 2016-02-17 DIAGNOSIS — G629 Polyneuropathy, unspecified: Secondary | ICD-10-CM | POA: Diagnosis not present

## 2016-02-17 DIAGNOSIS — I2510 Atherosclerotic heart disease of native coronary artery without angina pectoris: Secondary | ICD-10-CM | POA: Diagnosis not present

## 2016-02-17 DIAGNOSIS — M810 Age-related osteoporosis without current pathological fracture: Secondary | ICD-10-CM | POA: Diagnosis not present

## 2016-02-17 DIAGNOSIS — R296 Repeated falls: Secondary | ICD-10-CM | POA: Diagnosis not present

## 2016-02-17 DIAGNOSIS — M1990 Unspecified osteoarthritis, unspecified site: Secondary | ICD-10-CM | POA: Diagnosis not present

## 2016-02-18 ENCOUNTER — Telehealth: Payer: Self-pay | Admitting: Cardiovascular Disease

## 2016-02-18 NOTE — Telephone Encounter (Signed)
Spoke to daughter [name]. She notes Zigmund Daniel, other daughter, was at appt the other day. Zigmund Daniel communicated updates to [name], who has additional questions.  She voiced that she was asking about patient's lungs, and whether they were "truly collapsed" (cites concern due to his posture/being in wheelchair) and whether this was reversible. Asked if small pillow behind his back might help, having patient stand up for a couple minutes during the day, etc? Asked if this should this be deferred to PCP or other provider?  She notes he does not have home care but has some assistive help w family. Expresses that the patient has difficulty with change and would not be amenable to outside help.  Denies need for new appt at this time. Just needs further clarification w what was discussed at last appt.

## 2016-02-18 NOTE — Telephone Encounter (Signed)
One of the problems related to his reduced mobility, always sitting in same position will be some atelectasis of the bases of his lungs. A might be helpful to get him an incentive spirometer. This is an inexpensive and easy to use device that he can use to fully inflate his lungs periodically during the day. Having him stand up several times for just a couple of minutes during the day is also a good idea. I hope this answers her concerns. MCr

## 2016-02-18 NOTE — Telephone Encounter (Signed)
Please call,concerning his lung not working properly,pt is inI a wheel chair.He is very fatigued and having difficulty with his daily routine.

## 2016-02-18 NOTE — Telephone Encounter (Signed)
Spoke to daughter, communicated recommendation. She notes he probably does have an incentive spirometer from a prior hospital admission - wants to know if any guidance as far as volume of inspiration, other parameters for use? Aware I'll seek recommendation, & follow up w suggestions.

## 2016-02-19 DIAGNOSIS — M810 Age-related osteoporosis without current pathological fracture: Secondary | ICD-10-CM | POA: Diagnosis not present

## 2016-02-19 DIAGNOSIS — R1314 Dysphagia, pharyngoesophageal phase: Secondary | ICD-10-CM | POA: Diagnosis not present

## 2016-02-19 DIAGNOSIS — R296 Repeated falls: Secondary | ICD-10-CM | POA: Diagnosis not present

## 2016-02-19 DIAGNOSIS — G629 Polyneuropathy, unspecified: Secondary | ICD-10-CM | POA: Diagnosis not present

## 2016-02-19 DIAGNOSIS — I2510 Atherosclerotic heart disease of native coronary artery without angina pectoris: Secondary | ICD-10-CM | POA: Diagnosis not present

## 2016-02-19 DIAGNOSIS — M1990 Unspecified osteoarthritis, unspecified site: Secondary | ICD-10-CM | POA: Diagnosis not present

## 2016-02-19 NOTE — Telephone Encounter (Signed)
Discussed advice w daughter, who verbalized understanding. Informed me PT will come out and she is, in addition to adding the incentive spirometry as part of patient's daily regimen, going to ask about a standing frame for support. She'll call if further needs.

## 2016-02-19 NOTE — Telephone Encounter (Signed)
Have him give the deepest inspiration he can. Then add about 200 mL to that and set as his goal, use several time an hour while awake (whenever a commercial comes on TV). Increase the goal gradually until he can do at least 1L.

## 2016-02-24 DIAGNOSIS — I2510 Atherosclerotic heart disease of native coronary artery without angina pectoris: Secondary | ICD-10-CM | POA: Diagnosis not present

## 2016-02-24 DIAGNOSIS — M1990 Unspecified osteoarthritis, unspecified site: Secondary | ICD-10-CM | POA: Diagnosis not present

## 2016-02-24 DIAGNOSIS — R1314 Dysphagia, pharyngoesophageal phase: Secondary | ICD-10-CM | POA: Diagnosis not present

## 2016-02-24 DIAGNOSIS — G629 Polyneuropathy, unspecified: Secondary | ICD-10-CM | POA: Diagnosis not present

## 2016-02-24 DIAGNOSIS — M810 Age-related osteoporosis without current pathological fracture: Secondary | ICD-10-CM | POA: Diagnosis not present

## 2016-02-24 DIAGNOSIS — R296 Repeated falls: Secondary | ICD-10-CM | POA: Diagnosis not present

## 2016-02-26 DIAGNOSIS — M810 Age-related osteoporosis without current pathological fracture: Secondary | ICD-10-CM | POA: Diagnosis not present

## 2016-02-26 DIAGNOSIS — I2510 Atherosclerotic heart disease of native coronary artery without angina pectoris: Secondary | ICD-10-CM | POA: Diagnosis not present

## 2016-02-26 DIAGNOSIS — G629 Polyneuropathy, unspecified: Secondary | ICD-10-CM | POA: Diagnosis not present

## 2016-02-26 DIAGNOSIS — R296 Repeated falls: Secondary | ICD-10-CM | POA: Diagnosis not present

## 2016-02-26 DIAGNOSIS — M1990 Unspecified osteoarthritis, unspecified site: Secondary | ICD-10-CM | POA: Diagnosis not present

## 2016-02-26 DIAGNOSIS — R1314 Dysphagia, pharyngoesophageal phase: Secondary | ICD-10-CM | POA: Diagnosis not present

## 2016-07-16 IMAGING — CR DG CHEST 2V
2 series · 2 of 2 positions shown · non-contrast
Comparison: 02/26/2009

CLINICAL DATA: Wheezing and cough for 5 days.

EXAM:
CHEST  2 VIEW

[PA]
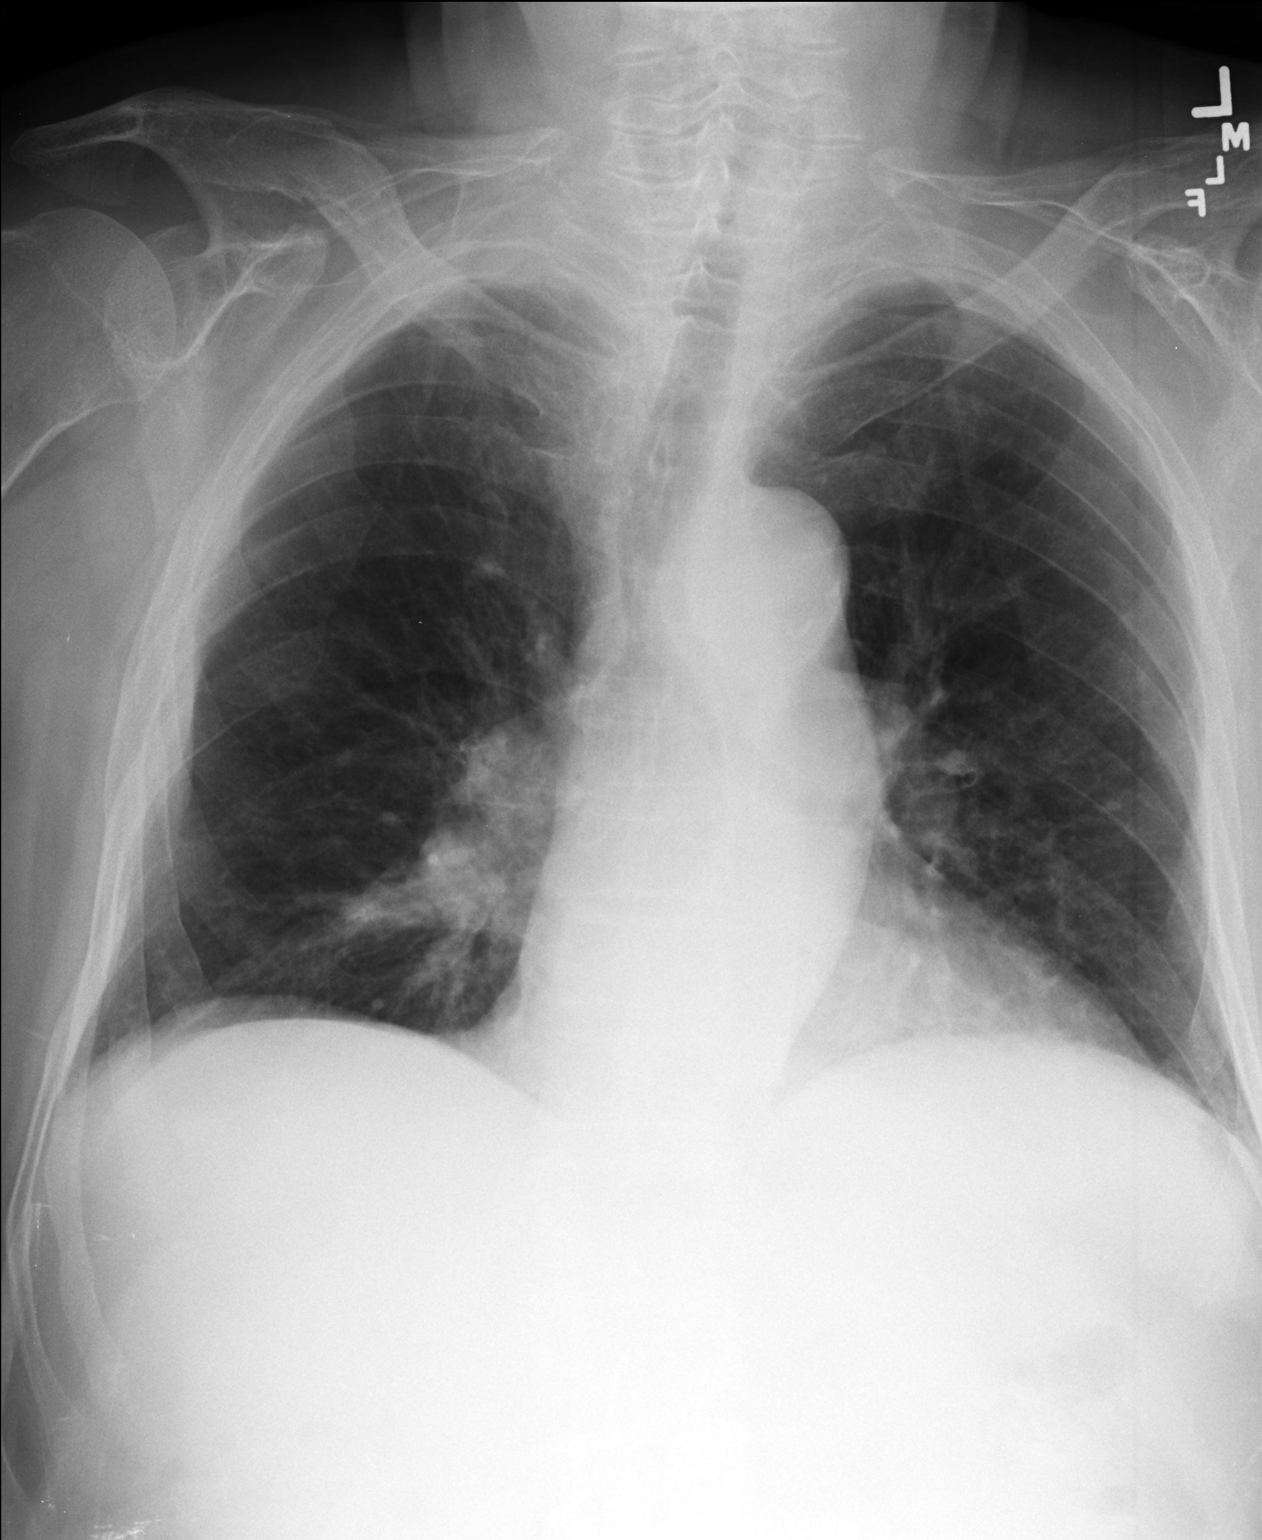

[lateral]
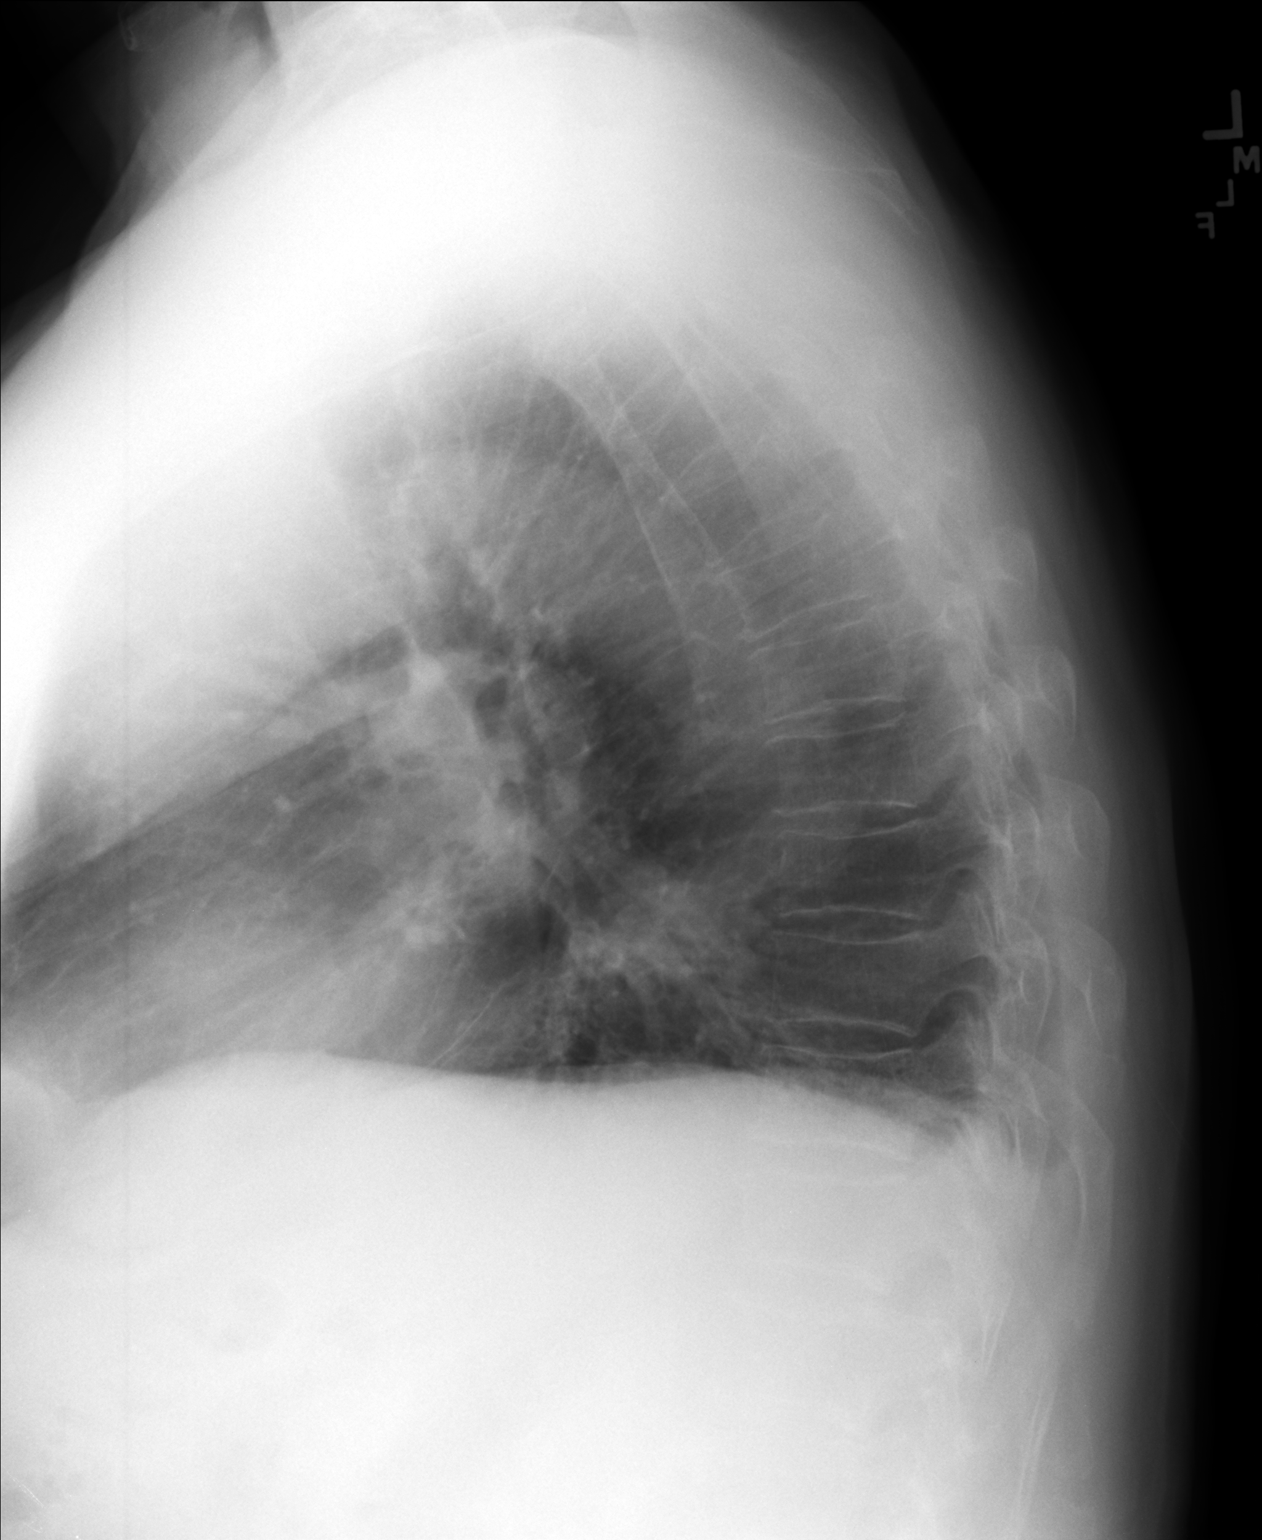

[2 of 2 positions shown; findings below may reference images not displayed]

FINDINGS: The heart is normal in size and stable. There is moderate
tortuosity, ectasia and calcification of the thoracic aorta. The
right pulmonary hila appears prominent and changed since prior chest
x-ray. Could not exclude adenopathy or mass. Chest CT with contrast
may be helpful for further evaluation. No acute pulmonary findings.
No pleural effusion. The bony thorax is intact.
IMPRESSION: Prominent right pulmonary hila. Recommend follow-up chest CT with
contrast for further evaluation.

No acute pulmonary findings.

## 2016-08-10 ENCOUNTER — Encounter: Payer: Self-pay | Admitting: Cardiovascular Disease

## 2016-08-10 ENCOUNTER — Ambulatory Visit (INDEPENDENT_AMBULATORY_CARE_PROVIDER_SITE_OTHER): Payer: Medicare Other | Admitting: Cardiovascular Disease

## 2016-08-10 VITALS — BP 150/64 | HR 62 | Ht 71.0 in | Wt 205.0 lb

## 2016-08-10 DIAGNOSIS — I10 Essential (primary) hypertension: Secondary | ICD-10-CM

## 2016-08-10 DIAGNOSIS — E7800 Pure hypercholesterolemia, unspecified: Secondary | ICD-10-CM

## 2016-08-10 DIAGNOSIS — I209 Angina pectoris, unspecified: Secondary | ICD-10-CM

## 2016-08-10 DIAGNOSIS — I25118 Atherosclerotic heart disease of native coronary artery with other forms of angina pectoris: Secondary | ICD-10-CM | POA: Diagnosis not present

## 2016-08-10 NOTE — Patient Instructions (Signed)
Dr [name] recommends that you continue on your current medications as directed. Please refer to the Current Medication list given to you today.  Your physician has requested that you regularly monitor and record your blood pressure readings at home. Please use the same machine to check your readings. Check your blood pressure daily and send readings through mychart in 1 week.   Dr [name] recommends that you schedule a follow-up appointment in 12 months. You will receive a reminder letter in the mail two months in advance. If you don't receive a letter, please call our office to schedule the follow-up appointment.  If you need a refill on your cardiac medications before your next appointment, please call your pharmacy.

## 2016-08-10 NOTE — Progress Notes (Signed)
Patient ID: [name] [name], male   DOB: 09-Sep-1925, 83 y.o.   MRN: [id number]    Cardiology Office Note    Date:  08/12/2016   ID:  [name] [name], DOB 1925-10-08, MRN [id number]  PCP:  [name], MD  Cardiologist:   [name], MD   Chief Complaint  Patient presents with  . Follow-up CAD, 18 months s/p PCI-DES    No complaints    History of Present Illness:  Abdulkarim [name] is a 83 y.o. male with CAD, returning for follow up.  He has generally done well since his last appointment. He has been eating Better and his weight has stabilized. He has not had angina pectoris and denies dyspnea or leg edema. When he tried to wean off the isosorbide he did not feel as well and has restarted the medication. Still has problems with orthostatic dizziness. He is primarily limited by unsteady gait to convert to neuropathy. He does not have intermittent claudication or new focal neurological deficits. She is hard of hearing.  Cardiac cath on May 08, 2014 showed normal left ventricle systolic function and mild distal anterolateral hypokinesis. He had chronic total occlusion of the proximal LAD with distal filling via left to left and right-to-left collaterals. Mild to moderate lesions were seen in the circumflex and right coronary artery system and a new 95% stenosis in the distal right coronary artery just proximal to the ostium of the PDA with jeopardy to the LAD collaterals. He received a drug-eluting stent (3.518 mm Xience Alpine) to the right coronary artery.  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     Past Medical History:  Diagnosis Date  . Anginal pain ([place])   . Arthritis    "all over"  . Coronary artery disease   . Depression ~ 73; ~ 1990  . GERD (gastroesophageal reflux disease)   . High cholesterol   . Hypertension   . Hyperthyroidism   . Neuropathy   . Osteoporosis   . Skin cancer of nose     Past Surgical  History:  Procedure Laterality Date  . BACK SURGERY    . CARDIAC CATHETERIZATION  09/2007  . CARDIAC CATHETERIZATION N/A 05/08/2014   Procedure: CORONARY STENT INTERVENTION;  Surgeon: [name], MD; 3.518 mm Xience Alpine DES to the RCA   . LEFT HEART CATHETERIZATION WITH CORONARY ANGIOGRAM N/A 05/08/2014   Procedure: LEFT HEART CATHETERIZATION WITH CORONARY ANGIOGRAM;  Surgeon: [name], MD; LAD 100% (old), CFX 20%, OM1 30%, pRCA 30%, dRCA 95%, EF 55%     . LUMBAR LAMINECTOMY/DECOMPRESSION MICRODISCECTOMY  2011    Outpatient Medications Prior to Visit  Medication Sig Dispense Refill  . acetaminophen (TYLENOL) 500 MG tablet Take 1,000 mg by mouth every 6 (six) hours as needed (pain).    [name] amLODipine (NORVASC) 10 MG tablet Take 5 mg by mouth daily.     [name] aspirin 81 MG tablet Take 1 tablet (81 mg total) by mouth daily. 30 tablet 11  . atorvastatin (LIPITOR) 20 MG tablet Take 10 mg by mouth daily.    . calcium-vitamin D (OSCAL WITH D) 500-200 MG-UNIT per tablet Take 1 tablet by mouth 2 (two) times daily.     . Cyanocobalamin (B-12 COMPLIANCE INJECTION IJ) Inject as directed every 30 (thirty) days.    . finasteride (PROSCAR) 5 MG tablet Take 5 mg by mouth daily.    . isosorbide mononitrate (IMDUR) 60  MG 24 hr tablet Take 0.5 tablets (30 mg total) by mouth every evening. 45 tablet 3  . lisinopril-hydrochlorothiazide (PRINZIDE,ZESTORETIC) 20-12.5 MG per tablet Take 2 tablets by mouth every morning.     . Melatonin 1 MG TABS Take 1 mg by mouth every evening.     . metoprolol (LOPRESSOR) 100 MG tablet Take 100 mg by mouth 2 (two) times daily.    . Omega-3 Fatty Acids (FISH OIL) 1200 MG CAPS Take 1 capsule by mouth daily.    . pantoprazole (PROTONIX) 40 MG tablet Take 1 tablet (40 mg total) by mouth daily. 30 tablet 11  . tamsulosin (FLOMAX) 0.4 MG CAPS capsule Take 1 capsule (0.4 mg total) by mouth daily after supper. 30 capsule 0  . Tetrahydrozoline HCl (EYE DROPS OP) Apply 1 drop to eye  daily as needed (dry eyes).     . traZODone (DESYREL) 50 MG tablet Take 50 mg by mouth at bedtime.      No facility-administered medications prior to visit.      Allergies:   Cortisone   Social History   Social History  . Marital status: Divorced    Spouse name: N/A  . Number of children: N/A  . Years of education: N/A   Social History Main Topics  . Smoking status: Former Smoker    Packs/day: 1.00    Years: 20.00    Types: Cigarettes    Quit date: 01/12/1962  . Smokeless tobacco: Never Used  . Alcohol use No     Comment: 05/08/2014 "he's been in [place] for ~!35 yrs"  . Drug use: No  . Sexual activity: Not Asked   Other Topics Concern  . None   Social History Narrative   He was in [name] youth camps as a child. He emigrated to the [place] after World War II. He was staying with his host family in [place] when he got drafted for [place].      He now lives in [place] and his daughter helps in his care.       ROS:   Please see the history of present illness.    ROS All other systems reviewed and are negative.   PHYSICAL EXAM:   VS:  BP (!) 150/64   Pulse 62   Ht 5\' 11"  (1.803 m)   Wt 205 lb (93 kg)   BMI 28.59 kg/m    rechecked his blood pressure 150/68 mmHg  General: Alert, oriented x3, no distress Head: no evidence of trauma, PERRL, EOMI, no exophtalmos or lid lag, no myxedema, no xanthelasma; normal ears, nose and oropharynx Neck: normal jugular venous pulsations and no hepatojugular reflux; brisk carotid pulses without delay and no carotid bruits Chest: clear to auscultation, no signs of consolidation by percussion or palpation, normal fremitus, symmetrical and full respiratory excursions Cardiovascular: normal position and quality of the apical impulse, regular rhythm, normal first and second heart sounds, no murmurs, rubs or gallops Abdomen: no tenderness or distention, no masses by palpation, no abnormal pulsatility or arterial bruits, normal bowel  sounds, no hepatosplenomegaly Extremities: no clubbing, cyanosis or edema; 2+ radial, ulnar and brachial pulses bilaterally; 2+ right femoral, posterior tibial and dorsalis pedis pulses; 2+ left femoral, posterior tibial and dorsalis pedis pulses; no subclavian or femoral bruits Neurological: grossly nonfocal Psych: euthymic mood, full affect  Wt Readings from Last 3 Encounters:  08/10/16 205 lb (93 kg)  02/10/16 200 lb (90.7 kg)  12/02/15 201 lb 6.4 oz (91.4 kg)  Studies/Labs Reviewed:   EKG:  EKG is ordered today. It shows normal sinus rhythm, mild first-degree AV block PR 222 ms, old inferior infarction, no ST segment abnormalities, QTC 432 ms  Lipid Panel    Component Value Date/Time   CHOL 111 05/10/2014 1015   TRIG 60 05/10/2014 1015   HDL 30 (L) 05/10/2014 1015   CHOLHDL 3.7 05/10/2014 1015   VLDL 12 05/10/2014 1015   LDLCALC 69 05/10/2014 1015     ASSESSMENT:    1. Coronary artery disease due to lipid rich plaque      PLAN:  In order of problems listed above:  1. CAD: Asymptomatic. Requires high doses of 3 different antianginals, but as long as he takes these he feels well. Efforts to wean the isosorbide were not successful. Has chronic occlusion of the LAD artery and a stent placed in the right coronary artery almost 2 years ago.  2. HTN: As before, his blood pressure is mildly elevated, but the diastolic blood pressure is relatively low and he has issues with unsteady gait and risk for falls. Blood pressure control is not perfect, but is probably acceptable. Would like to avoid aggressive reduction in systolic blood pressure in this gentleman with neuropathy and unsteady gait. Continue same medications for now, asked him to send some blood pressure recordings from home.  3. HLP: Usually has labs performed at the [facility], appointment is on September 21    Medication Adjustments/Labs and Tests Ordered: Current medicines are reviewed at length with the  patient today.  Concerns regarding medicines are outlined above.  Medication changes, Labs and Tests ordered today are listed in the Patient Instructions below. Patient Instructions  Dr [name] recommends that you continue on your current medications as directed. Please refer to the Current Medication list given to you today.  Your physician has requested that you regularly monitor and record your blood pressure readings at home. Please use the same machine to check your readings. Check your blood pressure daily and send readings through mychart in 1 week.   Dr [name] recommends that you schedule a follow-up appointment in 12 months. You will receive a reminder letter in the mail two months in advance. If you don't receive a letter, please call our office to schedule the follow-up appointment.  If you need a refill on your cardiac medications before your next appointment, please call your pharmacy.      Signed, [name], MD  08/12/2016 4:38 PM    [facility], [place],   [address] Phone: [phone]; Fax: [phone]

## 2016-08-17 DIAGNOSIS — N401 Enlarged prostate with lower urinary tract symptoms: Secondary | ICD-10-CM | POA: Diagnosis not present

## 2016-08-17 DIAGNOSIS — R339 Retention of urine, unspecified: Secondary | ICD-10-CM | POA: Diagnosis not present

## 2016-08-22 ENCOUNTER — Encounter: Payer: Self-pay | Admitting: Cardiovascular Disease

## 2017-02-22 ENCOUNTER — Ambulatory Visit: Payer: Self-pay | Admitting: *Deleted

## 2017-02-22 DIAGNOSIS — E860 Dehydration: Secondary | ICD-10-CM | POA: Diagnosis not present

## 2017-02-22 DIAGNOSIS — R05 Cough: Secondary | ICD-10-CM | POA: Diagnosis not present

## 2017-02-22 DIAGNOSIS — R112 Nausea with vomiting, unspecified: Secondary | ICD-10-CM | POA: Diagnosis not present

## 2017-02-22 NOTE — Telephone Encounter (Signed)
Pt's daughter called and states her dad is wheezing today. He has a cough also and a cold over the last couple of weeks.  He fell into the car unto the seat when trying to get in from church yesterday. She said his belly hit the seat. He threw up when he got home and had this wheezing this morning.  Appointment made for tomorrow for him. Care advice given to daughter with verbal understanding. She will go to the Urgent Care if he becomes worse.   Reason for Disposition . [1] MODERATE longstanding difficulty breathing (e.g., speaks in phrases, SOB even at rest, pulse 100-120) AND [2] SAME as normal  Answer Assessment - Initial Assessment Questions 1. RESPIRATORY STATUS: "Describe your breathing?" (e.g., wheezing, shortness of breath, unable to speak, severe coughing)      wheezing 2. ONSET: "When did this breathing problem begin?"      This morning 3. PATTERN "Does the difficult breathing come and go, or has it been constant since it started?"      At times 4. SEVERITY: "How bad is your breathing?" (e.g., mild, moderate, severe)    - MILD: No SOB at rest, mild SOB with walking, speaks normally in sentences, can lay down, no retractions, pulse < 100.    - MODERATE: SOB at rest, SOB with minimal exertion and prefers to sit, cannot lie down flat, speaks in phrases, mild retractions, audible wheezing, pulse 100-120.    - SEVERE: Very SOB at rest, speaks in single words, struggling to breathe, sitting hunched forward, retractions, pulse > 120      moderate 5. RECURRENT SYMPTOM: "Have you had difficulty breathing before?" If so, ask: "When was the last time?" and "What happened that time?"      Before associated with upper respiratory 6. CARDIAC HISTORY: "Do you have any history of heart disease?" (e.g., heart attack, angina, bypass surgery, angioplasty)      Angina and blockages 7. LUNG HISTORY: "Do you have any history of lung disease?"  (e.g., pulmonary embolus, asthma, emphysema)     no 8.  CAUSE: "What do you think is causing the breathing problem?"      Could be aspiration or a cold that he has had for the last couple of weeks 9. OTHER SYMPTOMS: "Do you have any other symptoms? (e.g., dizziness, runny nose, cough, chest pain, fever)     cough 10. PREGNANCY: "Is there any chance you are pregnant?" "When was your last menstrual period?"       no 11. TRAVEL: "Have you traveled out of the country in the last month?" (e.g., travel history, exposures)       no  Protocols used: BREATHING DIFFICULTY-A-AH

## 2017-02-23 ENCOUNTER — Inpatient Hospital Stay (HOSPITAL_BASED_OUTPATIENT_CLINIC_OR_DEPARTMENT_OTHER)
Admission: EM | Admit: 2017-02-23 | Discharge: 2017-02-27 | DRG: 193 | Disposition: A | Discharge status: Skilled Nursing Facility | Payer: Medicare Other | Attending: Family Medicine | Admitting: Family Medicine

## 2017-02-23 ENCOUNTER — Ambulatory Visit: Payer: Self-pay | Admitting: Physician Assistant

## 2017-02-23 ENCOUNTER — Encounter (HOSPITAL_BASED_OUTPATIENT_CLINIC_OR_DEPARTMENT_OTHER): Payer: Self-pay | Admitting: Emergency Medicine

## 2017-02-23 ENCOUNTER — Other Ambulatory Visit: Payer: Self-pay

## 2017-02-23 ENCOUNTER — Emergency Department (HOSPITAL_BASED_OUTPATIENT_CLINIC_OR_DEPARTMENT_OTHER): Payer: Medicare Other

## 2017-02-23 DIAGNOSIS — J9601 Acute respiratory failure with hypoxia: Secondary | ICD-10-CM | POA: Diagnosis present

## 2017-02-23 DIAGNOSIS — J40 Bronchitis, not specified as acute or chronic: Secondary | ICD-10-CM | POA: Diagnosis present

## 2017-02-23 DIAGNOSIS — Z85828 Personal history of other malignant neoplasm of skin: Secondary | ICD-10-CM

## 2017-02-23 DIAGNOSIS — J189 Pneumonia, unspecified organism: Secondary | ICD-10-CM

## 2017-02-23 DIAGNOSIS — E7849 Other hyperlipidemia: Secondary | ICD-10-CM | POA: Diagnosis not present

## 2017-02-23 DIAGNOSIS — J181 Lobar pneumonia, unspecified organism: Secondary | ICD-10-CM | POA: Diagnosis not present

## 2017-02-23 DIAGNOSIS — N3000 Acute cystitis without hematuria: Secondary | ICD-10-CM | POA: Diagnosis not present

## 2017-02-23 DIAGNOSIS — N400 Enlarged prostate without lower urinary tract symptoms: Secondary | ICD-10-CM | POA: Diagnosis present

## 2017-02-23 DIAGNOSIS — F4310 Post-traumatic stress disorder, unspecified: Secondary | ICD-10-CM | POA: Diagnosis not present

## 2017-02-23 DIAGNOSIS — R0602 Shortness of breath: Secondary | ICD-10-CM | POA: Diagnosis not present

## 2017-02-23 DIAGNOSIS — I2510 Atherosclerotic heart disease of native coronary artery without angina pectoris: Secondary | ICD-10-CM | POA: Diagnosis not present

## 2017-02-23 DIAGNOSIS — R509 Fever, unspecified: Secondary | ICD-10-CM | POA: Diagnosis not present

## 2017-02-23 DIAGNOSIS — J1000 Influenza due to other identified influenza virus with unspecified type of pneumonia: Principal | ICD-10-CM | POA: Diagnosis present

## 2017-02-23 DIAGNOSIS — Z9861 Coronary angioplasty status: Secondary | ICD-10-CM

## 2017-02-23 DIAGNOSIS — M6281 Muscle weakness (generalized): Secondary | ICD-10-CM | POA: Diagnosis not present

## 2017-02-23 DIAGNOSIS — R112 Nausea with vomiting, unspecified: Secondary | ICD-10-CM | POA: Diagnosis present

## 2017-02-23 DIAGNOSIS — M810 Age-related osteoporosis without current pathological fracture: Secondary | ICD-10-CM | POA: Diagnosis present

## 2017-02-23 DIAGNOSIS — Z87891 Personal history of nicotine dependence: Secondary | ICD-10-CM | POA: Diagnosis not present

## 2017-02-23 DIAGNOSIS — Z79899 Other long term (current) drug therapy: Secondary | ICD-10-CM | POA: Diagnosis not present

## 2017-02-23 DIAGNOSIS — J101 Influenza due to other identified influenza virus with other respiratory manifestations: Secondary | ICD-10-CM | POA: Diagnosis not present

## 2017-02-23 DIAGNOSIS — R531 Weakness: Secondary | ICD-10-CM | POA: Diagnosis not present

## 2017-02-23 DIAGNOSIS — J1108 Influenza due to unidentified influenza virus with specified pneumonia: Secondary | ICD-10-CM | POA: Diagnosis not present

## 2017-02-23 DIAGNOSIS — R1314 Dysphagia, pharyngoesophageal phase: Secondary | ICD-10-CM | POA: Diagnosis present

## 2017-02-23 DIAGNOSIS — R1312 Dysphagia, oropharyngeal phase: Secondary | ICD-10-CM | POA: Diagnosis not present

## 2017-02-23 DIAGNOSIS — E7800 Pure hypercholesterolemia, unspecified: Secondary | ICD-10-CM | POA: Diagnosis present

## 2017-02-23 DIAGNOSIS — I10 Essential (primary) hypertension: Secondary | ICD-10-CM | POA: Diagnosis not present

## 2017-02-23 DIAGNOSIS — Z993 Dependence on wheelchair: Secondary | ICD-10-CM | POA: Diagnosis not present

## 2017-02-23 DIAGNOSIS — N179 Acute kidney failure, unspecified: Secondary | ICD-10-CM | POA: Diagnosis present

## 2017-02-23 DIAGNOSIS — R05 Cough: Secondary | ICD-10-CM | POA: Diagnosis not present

## 2017-02-23 DIAGNOSIS — N390 Urinary tract infection, site not specified: Secondary | ICD-10-CM | POA: Diagnosis present

## 2017-02-23 DIAGNOSIS — G603 Idiopathic progressive neuropathy: Secondary | ICD-10-CM | POA: Diagnosis not present

## 2017-02-23 DIAGNOSIS — G629 Polyneuropathy, unspecified: Secondary | ICD-10-CM | POA: Diagnosis present

## 2017-02-23 DIAGNOSIS — F329 Major depressive disorder, single episode, unspecified: Secondary | ICD-10-CM | POA: Diagnosis present

## 2017-02-23 DIAGNOSIS — J111 Influenza due to unidentified influenza virus with other respiratory manifestations: Secondary | ICD-10-CM | POA: Diagnosis not present

## 2017-02-23 DIAGNOSIS — Z7982 Long term (current) use of aspirin: Secondary | ICD-10-CM

## 2017-02-23 DIAGNOSIS — E0590 Thyrotoxicosis, unspecified without thyrotoxic crisis or storm: Secondary | ICD-10-CM | POA: Diagnosis present

## 2017-02-23 DIAGNOSIS — K219 Gastro-esophageal reflux disease without esophagitis: Secondary | ICD-10-CM | POA: Diagnosis present

## 2017-02-23 DIAGNOSIS — F339 Major depressive disorder, recurrent, unspecified: Secondary | ICD-10-CM | POA: Diagnosis not present

## 2017-02-23 LAB — COMPREHENSIVE METABOLIC PANEL
ALK PHOS: 83 U/L (ref 38–126)
ALT: 27 U/L (ref 17–63)
AST: 33 U/L (ref 15–41)
Albumin: 3.6 g/dL (ref 3.5–5.0)
Anion gap: 10 (ref 5–15)
BUN: 26 mg/dL — AB (ref 6–20)
CALCIUM: 9.1 mg/dL (ref 8.9–10.3)
CHLORIDE: 99 mmol/L — AB (ref 101–111)
CO2: 29 mmol/L (ref 22–32)
CREATININE: 1.51 mg/dL — AB (ref 0.61–1.24)
GFR calc Af Amer: 45 mL/min — ABNORMAL LOW (ref >60)
GFR calc non Af Amer: 39 mL/min — ABNORMAL LOW (ref >60)
Glucose, Bld: 100 mg/dL — ABNORMAL HIGH (ref 65–99)
Potassium: 4 mmol/L (ref 3.5–5.1)
Sodium: 138 mmol/L (ref 135–145)
Total Bilirubin: 0.7 mg/dL (ref 0.3–1.2)
Total Protein: 7.1 g/dL (ref 6.5–8.1)

## 2017-02-23 LAB — CBC WITH DIFFERENTIAL/PLATELET
Basophils Absolute: 0 10*3/uL (ref 0.0–0.1)
Basophils Relative: 0 %
EOS PCT: 0 %
Eosinophils Absolute: 0 10*3/uL (ref 0.0–0.7)
HEMATOCRIT: 44.2 % (ref 39.0–52.0)
Hemoglobin: 14.6 g/dL (ref 13.0–17.0)
LYMPHS PCT: 15 %
Lymphs Abs: 1 10*3/uL (ref 0.7–4.0)
MCH: 31.5 pg (ref 26.0–34.0)
MCHC: 33 g/dL (ref 30.0–36.0)
MCV: 95.3 fL (ref 78.0–100.0)
MONO ABS: 0.9 10*3/uL (ref 0.1–1.0)
MONOS PCT: 14 %
NEUTROS ABS: 4.4 10*3/uL (ref 1.7–7.7)
Neutrophils Relative %: 70 %
Platelets: 182 10*3/uL (ref 150–400)
RBC: 4.64 MIL/uL (ref 4.22–5.81)
RDW: 13.6 % (ref 11.5–15.5)
WBC: 6.2 10*3/uL (ref 4.0–10.5)

## 2017-02-23 LAB — URINALYSIS, MICROSCOPIC (REFLEX): RBC / HPF: NONE SEEN RBC/hpf (ref 0–5)

## 2017-02-23 LAB — URINALYSIS, ROUTINE W REFLEX MICROSCOPIC
BILIRUBIN URINE: NEGATIVE
GLUCOSE, UA: NEGATIVE mg/dL
HGB URINE DIPSTICK: NEGATIVE
KETONES UR: NEGATIVE mg/dL
Nitrite: NEGATIVE
PH: 6 (ref 5.0–8.0)
Protein, ur: NEGATIVE mg/dL
Specific Gravity, Urine: 1.02 (ref 1.005–1.030)

## 2017-02-23 LAB — BRAIN NATRIURETIC PEPTIDE: B NATRIURETIC PEPTIDE 5: 91.3 pg/mL (ref 0.0–100.0)

## 2017-02-23 LAB — I-STAT CG4 LACTIC ACID, ED: Lactic Acid, Venous: 0.88 mmol/L (ref 0.5–1.9)

## 2017-02-23 LAB — INFLUENZA PANEL BY PCR (TYPE A & B)
Influenza A By PCR: POSITIVE — AB
Influenza B By PCR: NEGATIVE

## 2017-02-23 MED ORDER — ASPIRIN 81 MG PO CHEW
81.0000 mg | CHEWABLE_TABLET | Freq: Every day | ORAL | Status: DC
  Administered 2017-02-23 – 2017-02-27 (×5): 81 mg via ORAL
  Filled 2017-02-23 (×5): qty 1

## 2017-02-23 MED ORDER — AZITHROMYCIN 500 MG IV SOLR
INTRAVENOUS | Status: AC
  Filled 2017-02-23: qty 1000

## 2017-02-23 MED ORDER — SODIUM CHLORIDE 0.9 % IV SOLN: 250.0000 mL | INTRAVENOUS | Status: DC | PRN

## 2017-02-23 MED ORDER — ATORVASTATIN CALCIUM 10 MG PO TABS
10.0000 mg | ORAL_TABLET | Freq: Every day | ORAL | Status: DC
  Administered 2017-02-23 – 2017-02-27 (×5): 10 mg via ORAL
  Filled 2017-02-23 (×5): qty 1

## 2017-02-23 MED ORDER — SODIUM CHLORIDE 0.9 % IV SOLN
500.0000 mg | Freq: Once | INTRAVENOUS | Status: AC
  Administered 2017-02-23: 500 mg via INTRAVENOUS
  Filled 2017-02-23: qty 500

## 2017-02-23 MED ORDER — SODIUM CHLORIDE 0.9 % IV BOLUS (SEPSIS)
500.0000 mL | Freq: Once | INTRAVENOUS | Status: AC
  Administered 2017-02-23: 500 mL via INTRAVENOUS

## 2017-02-23 MED ORDER — TAMSULOSIN HCL 0.4 MG PO CAPS
0.4000 mg | ORAL_CAPSULE | Freq: Every day | ORAL | Status: DC
  Administered 2017-02-23 – 2017-02-26 (×4): 0.4 mg via ORAL
  Filled 2017-02-23 (×4): qty 1

## 2017-02-23 MED ORDER — PANTOPRAZOLE SODIUM 40 MG PO TBEC
40.0000 mg | DELAYED_RELEASE_TABLET | Freq: Every day | ORAL | Status: DC
  Administered 2017-02-23 – 2017-02-27 (×5): 40 mg via ORAL
  Filled 2017-02-23 (×5): qty 1

## 2017-02-23 MED ORDER — LISINOPRIL-HYDROCHLOROTHIAZIDE 20-12.5 MG PO TABS: 2.0000 | ORAL_TABLET | Freq: Every morning | ORAL | Status: DC

## 2017-02-23 MED ORDER — ALBUTEROL SULFATE (2.5 MG/3ML) 0.083% IN NEBU
5.0000 mg | INHALATION_SOLUTION | Freq: Once | RESPIRATORY_TRACT | Status: AC
  Administered 2017-02-23: 5 mg via RESPIRATORY_TRACT
  Filled 2017-02-23: qty 6

## 2017-02-23 MED ORDER — SODIUM CHLORIDE 0.9% FLUSH: 3.0000 mL | INTRAVENOUS | Status: DC | PRN

## 2017-02-23 MED ORDER — METOPROLOL TARTRATE 50 MG PO TABS
100.0000 mg | ORAL_TABLET | Freq: Two times a day (BID) | ORAL | Status: DC
  Administered 2017-02-23 – 2017-02-27 (×8): 100 mg via ORAL
  Filled 2017-02-23 (×8): qty 2

## 2017-02-23 MED ORDER — TRAZODONE HCL 50 MG PO TABS
50.0000 mg | ORAL_TABLET | Freq: Every day | ORAL | Status: DC
  Administered 2017-02-23 – 2017-02-26 (×4): 50 mg via ORAL
  Filled 2017-02-23 (×4): qty 1

## 2017-02-23 MED ORDER — FINASTERIDE 5 MG PO TABS
5.0000 mg | ORAL_TABLET | Freq: Every day | ORAL | Status: DC
  Administered 2017-02-23 – 2017-02-27 (×5): 5 mg via ORAL
  Filled 2017-02-23 (×5): qty 1

## 2017-02-23 MED ORDER — HYDROCHLOROTHIAZIDE 25 MG PO TABS
25.0000 mg | ORAL_TABLET | Freq: Every day | ORAL | Status: DC
  Administered 2017-02-23 – 2017-02-27 (×5): 25 mg via ORAL
  Filled 2017-02-23 (×5): qty 1

## 2017-02-23 MED ORDER — SODIUM CHLORIDE 0.9% FLUSH
3.0000 mL | Freq: Two times a day (BID) | INTRAVENOUS | Status: DC
  Administered 2017-02-23 – 2017-02-27 (×8): 3 mL via INTRAVENOUS

## 2017-02-23 MED ORDER — ISOSORBIDE MONONITRATE ER 30 MG PO TB24
30.0000 mg | ORAL_TABLET | Freq: Every day | ORAL | Status: DC
  Administered 2017-02-24 – 2017-02-27 (×4): 30 mg via ORAL
  Filled 2017-02-23 (×4): qty 1

## 2017-02-23 MED ORDER — SODIUM CHLORIDE 0.9 % IV SOLN
1.0000 g | INTRAVENOUS | Status: DC
  Administered 2017-02-24 – 2017-02-25 (×2): 1 g via INTRAVENOUS
  Filled 2017-02-23 (×2): qty 1

## 2017-02-23 MED ORDER — AMLODIPINE BESYLATE 5 MG PO TABS
5.0000 mg | ORAL_TABLET | Freq: Every day | ORAL | Status: DC
  Administered 2017-02-23 – 2017-02-27 (×5): 5 mg via ORAL
  Filled 2017-02-23 (×5): qty 1

## 2017-02-23 MED ORDER — SODIUM CHLORIDE 0.9 % IV SOLN
1.0000 g | Freq: Once | INTRAVENOUS | Status: AC
  Administered 2017-02-23: 1 g via INTRAVENOUS
  Filled 2017-02-23: qty 10

## 2017-02-23 MED ORDER — ACETAMINOPHEN 500 MG PO TABS
500.0000 mg | ORAL_TABLET | Freq: Four times a day (QID) | ORAL | Status: DC | PRN
Start: 2017-02-23 — End: 2017-02-27

## 2017-02-23 MED ORDER — LISINOPRIL 20 MG PO TABS
40.0000 mg | ORAL_TABLET | Freq: Every day | ORAL | Status: DC
  Administered 2017-02-23 – 2017-02-27 (×5): 40 mg via ORAL
  Filled 2017-02-23 (×5): qty 2

## 2017-02-23 MED ORDER — SODIUM CHLORIDE 0.9 % IV SOLN
500.0000 mg | INTRAVENOUS | Status: DC
  Administered 2017-02-24: 500 mg via INTRAVENOUS
  Filled 2017-02-23: qty 500

## 2017-02-23 MED ORDER — ORAL CARE MOUTH RINSE
15.0000 mL | Freq: Two times a day (BID) | OROMUCOSAL | Status: DC
  Filled 2017-02-23: qty 15

## 2017-02-23 NOTE — ED Notes (Signed)
Family at bedside. 

## 2017-02-23 NOTE — ED Provider Notes (Signed)
[facility] EMERGENCY DEPARTMENT Provider Note   CSN: [id number] Arrival date & time: 02/23/17  1147     History   Chief Complaint Chief Complaint  Patient presents with  . Cough    HPI [name] [name] is a 84 y.o. male.  HPI  84 year old male presents with cough and fever.  The daughter.  The patient has had some URI symptoms and congestion for a couple weeks.  Starting a couple days ago he developed some intermittent nausea and vomiting and has had a cough.  This morning he had green sputum.  He also had a temperature of 100.4.  He saw his PCP yesterday where they drew labs and did a chest x-ray and did not see an obvious pneumonia.  Due to the fever this morning they recommended he come to the ER for evaluation.  The daughter has been hearing him wheeze which is new over this last couple days.  He does not complain of significant shortness of breath.  He was found to have oxygen saturations of 86% on room air and now is saturating normally on nasal cannula oxygen.  Past Medical History:  Diagnosis Date  . Anginal pain ([place])   . Arthritis    "all over"  . Coronary artery disease   . Depression ~ 66; ~ 1990  . GERD (gastroesophageal reflux disease)   . High cholesterol   . Hypertension   . Hyperthyroidism   . Neuropathy   . Osteoporosis   . Skin cancer of nose     Patient Active Problem List   Diagnosis Date Noted  . Acute respiratory failure with hypoxia ([place]) 02/23/2017  . PTSD (post-traumatic stress disorder)   . Vomiting and diarrhea 11/09/2014  . Unstable angina ([place]) 05/10/2014  . Coronary artery disease due to lipid rich plaque   . Angina pectoris, crescendo ([place]) 04/28/2014  . Contusion of left hip 07/04/2013  . Fall 07/04/2013  . BPH (benign prostatic hypertrophy) 05/31/2013  . CAD (coronary artery disease) 05/31/2013  . UTI (urinary tract infection) 05/31/2013  . Hyperlipidemia 05/31/2013  . Dysphagia, pharyngoesophageal phase 04/10/2013  .  Frequent falls 04/10/2013  . Weakness 04/10/2013  . Hyperthyroidism, subclinical 04/10/2013  . GERD (gastroesophageal reflux disease) 04/06/2013  . Bladder outflow obstruction 04/06/2013  . Dehydration 04/05/2013  . Hypertension 04/05/2013  . Arthritis 04/05/2013  . Peripheral neuropathy 04/05/2013  . Gastroenteritis, acute 04/05/2013  . Gastroenteritis 04/05/2013    Past Surgical History:  Procedure Laterality Date  . BACK SURGERY    . CARDIAC CATHETERIZATION  09/2007  . CARDIAC CATHETERIZATION N/A 05/08/2014   Procedure: CORONARY STENT INTERVENTION;  Surgeon: [name], MD; 3.518 mm Xience Alpine DES to the RCA   . LEFT HEART CATHETERIZATION WITH CORONARY ANGIOGRAM N/A 05/08/2014   Procedure: LEFT HEART CATHETERIZATION WITH CORONARY ANGIOGRAM;  Surgeon: [name], MD; LAD 100% (old), CFX 20%, OM1 30%, pRCA 30%, dRCA 95%, EF 55%     . LUMBAR LAMINECTOMY/DECOMPRESSION MICRODISCECTOMY  2011       Home Medications    Prior to Admission medications   Medication Sig Start Date End Date Taking? Authorizing Provider  acetaminophen (TYLENOL) 500 MG tablet Take 1,000 mg by mouth every 6 (six) hours as needed (pain).    [provider]  amLODipine (NORVASC) 10 MG tablet Take 5 mg by mouth daily.     [provider]  aspirin 81 MG tablet Take 1 tablet (81 mg total) by mouth daily. 11/15/14   Croitoru,  Mihai, MD  atorvastatin (LIPITOR) 20 MG tablet Take 10 mg by mouth daily.    [provider]  calcium-vitamin D (OSCAL WITH D) 500-200 MG-UNIT per tablet Take 1 tablet by mouth 2 (two) times daily.     [provider]  Cyanocobalamin (B-12 COMPLIANCE INJECTION IJ) Inject as directed every 30 (thirty) days.    [provider]  finasteride (PROSCAR) 5 MG tablet Take 5 mg by mouth daily.    [provider]  isosorbide mononitrate (IMDUR) 60 MG 24 hr tablet Take 0.5 tablets (30 mg total) by mouth every evening. 12/02/15   Barrett,  Evelene Croon, PA-C  lisinopril-hydrochlorothiazide (PRINZIDE,ZESTORETIC) 20-12.5 MG per tablet Take 2 tablets by mouth every morning.     [provider]  Melatonin 1 MG TABS Take 1 mg by mouth every evening.     [provider]  metoprolol (LOPRESSOR) 100 MG tablet Take 100 mg by mouth 2 (two) times daily.    [provider]  Omega-3 Fatty Acids (FISH OIL) 1200 MG CAPS Take 1 capsule by mouth daily.    [provider]  pantoprazole (PROTONIX) 40 MG tablet Take 1 tablet (40 mg total) by mouth daily. 05/11/14   Barrett, Evelene Croon, PA-C  tamsulosin (FLOMAX) 0.4 MG CAPS capsule Take 1 capsule (0.4 mg total) by mouth daily after supper. 12/02/15   Barrett, Evelene Croon, PA-C  Tetrahydrozoline HCl (EYE DROPS OP) Apply 1 drop to eye daily as needed (dry eyes).     [provider]  traZODone (DESYREL) 50 MG tablet Take 50 mg by mouth at bedtime.     [provider]    Family History Family History  Problem Relation Age of Onset  . CAD Mother   . Asthma Mother     Social History Social History   Tobacco Use  . Smoking status: Former Smoker    Packs/day: 1.00    Years: 20.00    Pack years: 20.00    Types: Cigarettes    Last attempt to quit: 01/12/1962    Years since quitting: 55.1  . Smokeless tobacco: Never Used  Substance Use Topics  . Alcohol use: No    Alcohol/week: 0.0 oz    Comment: 05/08/2014 "he's been in [facility] for ~!35 yrs"  . Drug use: No     Allergies   Cortisone   Review of Systems Review of Systems  Constitutional: Positive for fever.  HENT: Positive for congestion.   Respiratory: Positive for cough, shortness of breath and wheezing.   Cardiovascular: Negative for chest pain and leg swelling.  Gastrointestinal: Positive for diarrhea and vomiting.  All other systems reviewed and are negative.    Physical Exam Updated Vital Signs BP 115/60   Pulse 60   Temp 98.5 F (36.9 C) (Oral)   Resp 14   Ht 5\' 11"  (1.803 m)   Wt  129.3 kg (285 lb)   SpO2 95%   BMI 39.75 kg/m   Physical Exam  Constitutional: He is oriented to person, place, and time. He appears well-developed and well-nourished.  HENT:  Head: Normocephalic and atraumatic.  Right Ear: External ear normal.  Left Ear: External ear normal.  Nose: Nose normal.  Eyes: Right eye exhibits no discharge. Left eye exhibits no discharge.  Neck: Neck supple.  Cardiovascular: Normal rate, regular rhythm and normal heart sounds.  Pulmonary/Chest: Effort normal. No accessory muscle usage. Tachypnea noted. He has wheezes (diffuse, mild, expiratory).  Abdominal: Soft. There is no tenderness.  Musculoskeletal: He exhibits no edema.  Neurological: He is alert and oriented to person, place, and time.  Skin: Skin is warm and dry.  Nursing note and vitals reviewed.    ED Treatments / Results  Labs (all labs ordered are listed, but only abnormal results are displayed) Labs Reviewed  COMPREHENSIVE METABOLIC PANEL - Abnormal; Notable for the following components:      Result Value   Chloride 99 (*)    Glucose, Bld 100 (*)    BUN 26 (*)    Creatinine, Ser 1.51 (*)    GFR calc non Af Amer 39 (*)    GFR calc Af Amer 45 (*)    All other components within normal limits  URINALYSIS, ROUTINE W REFLEX MICROSCOPIC - Abnormal; Notable for the following components:   APPearance CLOUDY (*)    Leukocytes, UA TRACE (*)    All other components within normal limits  URINALYSIS, MICROSCOPIC (REFLEX) - Abnormal; Notable for the following components:   Bacteria, UA MANY (*)    Squamous Epithelial / LPF 0-5 (*)    All other components within normal limits  CULTURE, BLOOD (ROUTINE X 2)  CULTURE, BLOOD (ROUTINE X 2)  URINE CULTURE  CBC WITH DIFFERENTIAL/PLATELET  BRAIN NATRIURETIC PEPTIDE  INFLUENZA PANEL BY PCR (TYPE A & B)  I-STAT CG4 LACTIC ACID, ED    EKG  EKG Interpretation  Date/Time:  Tuesday February 23 2017 12:40:40 EST Ventricular Rate:  61 PR Interval:     QRS Duration: 137 QT Interval:  430 QTC Calculation: 434 R Axis:   16 Text Interpretation:  Sinus rhythm Atrial premature complex Right bundle branch block no significant change since earlier in the day Confirmed by Sherwood Gambler [phone]) on 02/23/2017 1:07:56 PM       Radiology Dg Chest 2 View  Result Date: 02/23/2017 CLINICAL DATA:  Cough and fever EXAM: CHEST  2 VIEW COMPARISON:  February 22, 2017 FINDINGS: There is atelectatic change in the right base. Lungs elsewhere are clear. The heart is borderline enlarged with pulmonary venous hypertension. There is aortic atherosclerosis. No adenopathy. No bone lesions. IMPRESSION: Atelectasis right base. No edema or consolidation. Underlying pulmonary vascular congestion. There is aortic atherosclerosis. Aortic Atherosclerosis (ICD10-I70.0). Electronically Signed   By: Lowella Grip III M.D.   On: 02/23/2017 12:26    Procedures Procedures (including critical care time)  Medications Ordered in ED Medications  albuterol (PROVENTIL) (2.5 MG/3ML) 0.083% nebulizer solution 5 mg (5 mg Nebulization Given 02/23/17 1309)  cefTRIAXone (ROCEPHIN) 1 g in sodium chloride 0.9 % 100 mL IVPB (0 g Intravenous Stopped 02/23/17 1332)  azithromycin (ZITHROMAX) 500 mg in sodium chloride 0.9 % 250 mL IVPB (0 mg Intravenous Stopped 02/23/17 1430)  azithromycin (ZITHROMAX) 500 MG injection (  Return to [facility] 02/23/17 1454)  sodium chloride 0.9 % bolus 500 mL (0 mLs Intravenous Stopped 02/23/17 1404)     Initial Impression / Assessment and Plan / ED Course  I have reviewed the triage vital signs and the nursing notes.  Pertinent labs & imaging results that were available during my care of the patient were reviewed by me and considered in my medical decision making (see chart for details).     Patient is hypoxic on arrival but no distress.  He does feel somewhat better with supplemental nasal cannula oxygen.  His initial lab work is not consistent with sepsis  with a normal lactate.  He has not altered.  While he is not in distress, he will need  admission for hypoxia.  He does have some wheezing although this did not improve much with albuterol.  BNP sent to given possible pulmonary congestion.  However no frank edema or large pleural effusions.  Chest x-ray shows atelectasis but given the cough, low-grade fever, productive sputum I think this more is likely is pneumonia.  He was given Rocephin and azithromycin.  He will be admitted to the hospital service at [facility].  Dr. Bonner Puna acce currently waitingpts in transfer and admission.  For transfer and is stable.  Final Clinical Impressions(s) / ED Diagnoses   Final diagnoses:  Acute respiratory failure with hypoxia ([place])  Community acquired pneumonia of right lower lobe of lung [facility])    ED Discharge Orders    None       Sherwood Gambler, MD 02/23/17 1541

## 2017-02-23 NOTE — ED Notes (Signed)
Patient family states he made a mess when he used the urinal, Checked patient he has a dollar coin size wet spot on his pants, offered to change he pants patient states " no I am fine" daughter reasked patient and he states " Am I complaining" advised patient if he changes his mind just let us know and we will get him changed. RN [employer] notified.

## 2017-02-23 NOTE — ED Notes (Signed)
Patient transported to X-ray 

## 2017-02-23 NOTE — ED Notes (Signed)
Reassessed Pt. With family at bedside.  Pt. In no distress.  Explained to Pt. And family that the bed is not ready at [facility] as of yet.

## 2017-02-23 NOTE — ED Notes (Signed)
ED Provider at bedside. 

## 2017-02-23 NOTE — ED Triage Notes (Addendum)
Patient states that he has had N/V since Sunday  - he went to his PMD yesterrday and they were unable to do a Urine sample but was able to get blood work. Patient woke up this am with a fever and worsening SOB

## 2017-02-23 NOTE — H&P (Signed)
History and Physical    Raja Liska WJX:[id number] DOB: 06/24/1925 DOA: 02/23/2017  PCP: Thressa Sheller, MD  Patient coming from: Home  Chief Complaint: Cough and shortness of breath  HPI: [name] [name] is a 84 y.o. male with medical history significant of coronary artery disease, hypertension, hyperlipidemia comes in with 2 days of worsening cough and shortness of breath.  His daughter is worried about him because of his coughing and shortness of breath so brought him to [facility].  He is found to probably have pneumonia and hypoxia with O2 sats at 86% on room air.  He denies any urinary symptoms.  He has not had any sick contacts.  His influenza is pending.  Patient is referred for admission for community-acquired pneumonia and acute hypoxic respiratory failure.  Review of Systems: As per HPI otherwise 10 point review of systems negative.   Past Medical History:  Diagnosis Date  . Anginal pain ([place])   . Arthritis    "all over"  . Coronary artery disease   . Depression ~ 48; ~ 1990  . GERD (gastroesophageal reflux disease)   . High cholesterol   . Hypertension   . Hyperthyroidism   . Neuropathy   . Osteoporosis   . Skin cancer of nose     Past Surgical History:  Procedure Laterality Date  . BACK SURGERY    . CARDIAC CATHETERIZATION  09/2007  . CARDIAC CATHETERIZATION N/A 05/08/2014   Procedure: CORONARY STENT INTERVENTION;  Surgeon: [name], MD; 3.518 mm Xience [place] DES to the RCA   . LEFT HEART CATHETERIZATION WITH CORONARY ANGIOGRAM N/A 05/08/2014   Procedure: LEFT HEART CATHETERIZATION WITH CORONARY ANGIOGRAM;  Surgeon: [name], MD; LAD 100% (old), CFX 20%, OM1 30%, pRCA 30%, dRCA 95%, EF 55%     . LUMBAR LAMINECTOMY/DECOMPRESSION MICRODISCECTOMY  2011     reports that he quit smoking about 55 years ago. His smoking use included cigarettes. He has a 20.00 pack-year smoking history. he has never used smokeless tobacco. He reports that he  does not drink alcohol or use drugs.  Allergies  Allergen Reactions  . Cortisone Other (See Comments)    Causes emotional problems     Family History  Problem Relation Age of Onset  . CAD Mother   . Asthma Mother     Prior to Admission medications   Medication Sig Start Date End Date Taking? Authorizing Provider  atorvastatin (LIPITOR) 20 MG tablet Take 10 mg by mouth daily. Take one half tablet by mouth at bedtime   Yes [provider]  Melatonin 1 MG TABS Take 3 mg by mouth every evening.    Yes [provider]  metoprolol (LOPRESSOR) 100 MG tablet Take 100 mg by mouth 2 (two) times daily.   Yes [provider]  Multiple Vitamins-Minerals (PRESERVISION AREDS 2+MULTI VIT) CAPS Take 2 capsules by mouth daily.   Yes [provider]  traZODone (DESYREL) 50 MG tablet Take 50 mg by mouth at bedtime.    Yes [provider]  acetaminophen (TYLENOL) 500 MG tablet Take 1,000 mg by mouth every 6 (six) hours as needed (pain).    [provider]  amLODipine (NORVASC) 10 MG tablet Take 5 mg by mouth daily.     [provider]  aspirin 81 MG tablet Take 1 tablet (81 mg total) by mouth daily. 11/15/14   Croitoru, Mihai, MD  calcium-vitamin D (OSCAL WITH D) 500-200 MG-UNIT per tablet Take 1 tablet  by mouth 2 (two) times daily.     [provider]  Cyanocobalamin (B-12 COMPLIANCE INJECTION IJ) Inject as directed every 30 (thirty) days.    [provider]  finasteride (PROSCAR) 5 MG tablet Take 5 mg by mouth daily.    [provider]  isosorbide mononitrate (IMDUR) 60 MG 24 hr tablet Take 0.5 tablets (30 mg total) by mouth every evening. 12/02/15   Barrett, Evelene Croon, PA-C  lisinopril-hydrochlorothiazide (PRINZIDE,ZESTORETIC) 20-12.5 MG per tablet Take 2 tablets by mouth every morning.     [provider]  Omega-3 Fatty Acids (FISH OIL) 1200 MG CAPS Take 1 capsule by mouth daily.    [provider]    pantoprazole (PROTONIX) 40 MG tablet Take 1 tablet (40 mg total) by mouth daily. 05/11/14   Barrett, Evelene Croon, PA-C  tamsulosin (FLOMAX) 0.4 MG CAPS capsule Take 1 capsule (0.4 mg total) by mouth daily after supper. 12/02/15   Barrett, Evelene Croon, PA-C  Tetrahydrozoline HCl (EYE DROPS OP) Apply 1 drop to eye daily as needed (dry eyes).     [provider]    Physical Exam: Vitals:   02/23/17 1600 02/23/17 1700 02/23/17 1720 02/23/17 1844  BP: 124/67 121/62 (!) 110/54 127/66  Pulse: 60 64 (!) 58 (!) 58  Resp: 19 (!) 22 16 20   Temp:   98.3 F (36.8 C) 98.3 F (36.8 C)  TempSrc:   Oral Oral  SpO2: 95% 94% 95% 93%  Weight:    92.5 kg (203 lb 14.8 oz)  Height:    5\' 10"  (1.778 m)      Constitutional: NAD, calm, comfortable Vitals:   02/23/17 1600 02/23/17 1700 02/23/17 1720 02/23/17 1844  BP: 124/67 121/62 (!) 110/54 127/66  Pulse: 60 64 (!) 58 (!) 58  Resp: 19 (!) 22 16 20   Temp:   98.3 F (36.8 C) 98.3 F (36.8 C)  TempSrc:   Oral Oral  SpO2: 95% 94% 95% 93%  Weight:    92.5 kg (203 lb 14.8 oz)  Height:    5\' 10"  (1.778 m)   Eyes: PERRL, lids and conjunctivae normal ENMT: Mucous membranes are moist. Posterior pharynx clear of any exudate or lesions.Normal dentition.  Neck: normal, supple, no masses, no thyromegaly Respiratory: clear to auscultation bilaterally, no wheezing, no crackles. Normal respiratory effort. No accessory muscle use.  Cardiovascular: Regular rate and rhythm, no murmurs / rubs / gallops. No extremity edema. 2+ pedal pulses. No carotid bruits.  Abdomen: no tenderness, no masses palpated. No hepatosplenomegaly. Bowel sounds positive.  Musculoskeletal: no clubbing / cyanosis. No joint deformity upper and lower extremities. Good ROM, no contractures. Normal muscle tone.  Skin: no rashes, lesions, ulcers. No induration Neurologic: CN 2-12 grossly intact. Sensation intact, DTR normal. Strength 5/5 in all 4.  Psychiatric: Normal judgment and insight.  Alert and oriented x 3. Normal mood.    Labs on Admission: I have personally reviewed following labs and imaging studies  CBC: Recent Labs  Lab 02/23/17 1231  WBC 6.2  NEUTROABS 4.4  HGB 14.6  HCT 44.2  MCV 95.3  PLT 858   Basic Metabolic Panel: Recent Labs  Lab 02/23/17 1231  NA 138  K 4.0  CL 99*  CO2 29  GLUCOSE 100*  BUN 26*  CREATININE 1.51*  CALCIUM 9.1   GFR: Estimated Creatinine Clearance: 37.9 mL/min (A) (by C-G formula based on SCr of 1.51 mg/dL (H)). Liver Function Tests: Recent Labs  Lab 02/23/17 1231  AST 33  ALT 27  ALKPHOS 83  BILITOT 0.7  PROT 7.1  ALBUMIN 3.6   No results for input(s): LIPASE, AMYLASE in the last 168 hours. No results for input(s): AMMONIA in the last 168 hours. Coagulation Profile: No results for input(s): INR, PROTIME in the last 168 hours. Cardiac Enzymes: No results for input(s): CKTOTAL, CKMB, CKMBINDEX, TROPONINI in the last 168 hours. BNP (last 3 results) No results for input(s): PROBNP in the last 8760 hours. HbA1C: No results for input(s): HGBA1C in the last 72 hours. CBG: No results for input(s): GLUCAP in the last 168 hours. Lipid Profile: No results for input(s): CHOL, HDL, LDLCALC, TRIG, CHOLHDL, LDLDIRECT in the last 72 hours. Thyroid Function Tests: No results for input(s): TSH, T4TOTAL, FREET4, T3FREE, THYROIDAB in the last 72 hours. Anemia Panel: No results for input(s): VITAMINB12, FOLATE, FERRITIN, TIBC, IRON, RETICCTPCT in the last 72 hours. Urine analysis:    Component Value Date/Time   COLORURINE YELLOW 02/23/2017 1350   APPEARANCEUR CLOUDY (A) 02/23/2017 1350   LABSPEC 1.020 02/23/2017 1350   PHURINE 6.0 02/23/2017 1350   GLUCOSEU NEGATIVE 02/23/2017 1350   HGBUR NEGATIVE 02/23/2017 1350   BILIRUBINUR NEGATIVE 02/23/2017 1350   KETONESUR NEGATIVE 02/23/2017 1350   PROTEINUR NEGATIVE 02/23/2017 1350   UROBILINOGEN 0.2 11/09/2014 1532   NITRITE NEGATIVE 02/23/2017 1350   LEUKOCYTESUR TRACE  (A) 02/23/2017 1350   Sepsis Labs: !!!!!!!!!!!!!!!!!!!!!!!!!!!!!!!!!!!!!!!!!!!! @LABRCNTIP (procalcitonin:4,lacticidven:4) )No results found for this or any previous visit (from the past 240 hour(s)).   Radiological Exams on Admission: Dg Chest 2 View  Result Date: 02/23/2017 CLINICAL DATA:  Cough and fever EXAM: CHEST  2 VIEW COMPARISON:  February 22, 2017 FINDINGS: There is atelectatic change in the right base. Lungs elsewhere are clear. The heart is borderline enlarged with pulmonary venous hypertension. There is aortic atherosclerosis. No adenopathy. No bone lesions. IMPRESSION: Atelectasis right base. No edema or consolidation. Underlying pulmonary vascular congestion. There is aortic atherosclerosis. Aortic Atherosclerosis (ICD10-I70.0). Electronically Signed   By: Lowella Grip III M.D.   On: 02/23/2017 12:26     Chest x-ray reviewed personally shows some atelectasis  Old chart reviewed   Assessment/Plan 84 year old male with acute hypoxic respiratory failure secondary to pneumonia Principal Problem:   Acute respiratory failure with hypoxia (HCC)-secondary to infection.  Pneumonia.  Placed on supplemental oxygen and wean as tolerates.  His O2 sats were 86% on room air.  Normalized on 2 L of nasal cannula.  Active Problems:   UTI (urinary tract infection) possible-he denies symptoms for this should be covered by Rocephin follow-up on urine culture    CAP (community acquired pneumonia)-IV Rocephin and azithromycin ordered.  Obtain blood cultures and sputum cultures.  Supplemental oxygen as needed.    Nausea & vomiting-seems to have resolved    Hypertension-continue home meds    Dysphagia, pharyngoesophageal phase-this was noted in the past.  He just needs his meats chopped because of his dentures.    Weakness-due to the above    CAD (coronary artery disease)-stable    PTSD (post-traumatic stress disorder)     DVT prophylaxis: SCDs Code Status: Full Family  Communication: Daughter Disposition Plan: Per day team Consults called: None Admission status: Admission   Secily Walthour A MD [facility]  If 7PM-7AM, please contact night-coverage [email or web address] Password [facility]  02/23/2017, 7:55 PM

## 2017-02-24 DIAGNOSIS — N3000 Acute cystitis without hematuria: Secondary | ICD-10-CM

## 2017-02-24 DIAGNOSIS — I10 Essential (primary) hypertension: Secondary | ICD-10-CM

## 2017-02-24 LAB — BASIC METABOLIC PANEL
Anion gap: 10 (ref 5–15)
BUN: 25 mg/dL — AB (ref 6–20)
CALCIUM: 8.8 mg/dL — AB (ref 8.9–10.3)
CO2: 29 mmol/L (ref 22–32)
CREATININE: 1.37 mg/dL — AB (ref 0.61–1.24)
Chloride: 100 mmol/L — ABNORMAL LOW (ref 101–111)
GFR, EST AFRICAN AMERICAN: 51 mL/min — AB (ref >60)
GFR, EST NON AFRICAN AMERICAN: 44 mL/min — AB (ref >60)
Glucose, Bld: 91 mg/dL (ref 65–99)
Potassium: 4 mmol/L (ref 3.5–5.1)
SODIUM: 139 mmol/L (ref 135–145)

## 2017-02-24 LAB — CBC WITH DIFFERENTIAL/PLATELET
BASOS ABS: 0 10*3/uL (ref 0.0–0.1)
BASOS PCT: 0 %
EOS ABS: 0.1 10*3/uL (ref 0.0–0.7)
EOS PCT: 2 %
HCT: 43.1 % (ref 39.0–52.0)
Hemoglobin: 13.9 g/dL (ref 13.0–17.0)
Lymphocytes Relative: 20 %
Lymphs Abs: 1 10*3/uL (ref 0.7–4.0)
MCH: 31.1 pg (ref 26.0–34.0)
MCHC: 32.3 g/dL (ref 30.0–36.0)
MCV: 96.4 fL (ref 78.0–100.0)
Monocytes Absolute: 0.7 10*3/uL (ref 0.1–1.0)
Monocytes Relative: 15 %
Neutro Abs: 3.1 10*3/uL (ref 1.7–7.7)
Neutrophils Relative %: 63 %
PLATELETS: 160 10*3/uL (ref 150–400)
RBC: 4.47 MIL/uL (ref 4.22–5.81)
RDW: 13.6 % (ref 11.5–15.5)
WBC: 4.9 10*3/uL (ref 4.0–10.5)

## 2017-02-24 LAB — URINE CULTURE: Culture: <10000 — AB

## 2017-02-24 LAB — STREP PNEUMONIAE URINARY ANTIGEN: STREP PNEUMO URINARY ANTIGEN: NEGATIVE

## 2017-02-24 MED ORDER — GUAIFENESIN ER 600 MG PO TB12
600.0000 mg | ORAL_TABLET | Freq: Two times a day (BID) | ORAL | Status: DC
  Administered 2017-02-24 – 2017-02-27 (×6): 600 mg via ORAL
  Filled 2017-02-24 (×6): qty 1

## 2017-02-24 MED ORDER — OSELTAMIVIR PHOSPHATE 30 MG PO CAPS
30.0000 mg | ORAL_CAPSULE | Freq: Two times a day (BID) | ORAL | Status: DC
  Administered 2017-02-24 – 2017-02-27 (×8): 30 mg via ORAL
  Filled 2017-02-24 (×8): qty 1

## 2017-02-24 MED ORDER — IPRATROPIUM-ALBUTEROL 0.5-2.5 (3) MG/3ML IN SOLN
3.0000 mL | Freq: Three times a day (TID) | RESPIRATORY_TRACT | Status: DC
  Administered 2017-02-24 – 2017-02-27 (×10): 3 mL via RESPIRATORY_TRACT
  Filled 2017-02-24 (×10): qty 3

## 2017-02-24 MED ORDER — ENOXAPARIN SODIUM 40 MG/0.4ML ~~LOC~~ SOLN
40.0000 mg | SUBCUTANEOUS | Status: DC
  Administered 2017-02-24 – 2017-02-26 (×3): 40 mg via SUBCUTANEOUS
  Filled 2017-02-24 (×3): qty 0.4

## 2017-02-24 MED ORDER — FLUTICASONE PROPIONATE 50 MCG/ACT NA SUSP
2.0000 | Freq: Every day | NASAL | Status: DC
  Administered 2017-02-24 – 2017-02-27 (×4): 2 via NASAL
  Filled 2017-02-24: qty 16

## 2017-02-24 MED ORDER — IPRATROPIUM-ALBUTEROL 0.5-2.5 (3) MG/3ML IN SOLN
3.0000 mL | RESPIRATORY_TRACT | Status: DC | PRN
  Administered 2017-02-24: 3 mL via RESPIRATORY_TRACT
  Filled 2017-02-24 (×2): qty 3

## 2017-02-24 MED ORDER — ONDANSETRON HCL 4 MG/2ML IJ SOLN
4.0000 mg | Freq: Four times a day (QID) | INTRAMUSCULAR | Status: DC | PRN
  Administered 2017-02-24: 4 mg via INTRAVENOUS
  Filled 2017-02-24: qty 2

## 2017-02-24 NOTE — Progress Notes (Signed)
PHARMACY NOTE:  ANTIMICROBIAL RENAL DOSAGE ADJUSTMENT  Current antimicrobial regimen includes a mismatch between antimicrobial dosage and estimated renal function.  As per policy approved by the Pharmacy & Therapeutics and Medical Executive Committees, the antimicrobial dosage will be adjusted accordingly.  Current antimicrobial dosage:  Oseltamivir 75mg  po bid for 10 total doses  Indication: Influenza  Renal Function:  Estimated Creatinine Clearance: 37.9 mL/min (A) (by C-G formula based on SCr of 1.51 mg/dL (H)). []      On intermittent HD, scheduled: []      On CRRT    Antimicrobial dosage has been changed to:  Oseltamivir 30mg  po bid for 10 total doses  Additional comments:   Thank you for allowing pharmacy to be a part of this patient's care.  Nani Skillern [place], [facility] 02/24/2017 12:35 AM

## 2017-02-24 NOTE — Progress Notes (Signed)
Assumed care of pt from previous RN. Agree with earlier assessment. Eulas Post, RN

## 2017-02-24 NOTE — Evaluation (Signed)
Physical Therapy Evaluation Patient Details Name: Nation Cradle MRN: [id number] DOB: 1925/05/03 Today's Date: 02/24/2017   History of Present Illness  84 y.o. male with medical history significant of coronary artery disease, hypertension, hyperlipidemia. Pt found to have pneumonia, + influenza A.   Clinical Impression  Pt admitted with above diagnosis. Pt currently with functional limitations due to the deficits listed below (see PT Problem List).  Pt will benefit from skilled PT to increase their independence and safety with mobility to allow discharge to the venue listed below.  Daughter reports (while in hallway) that pt has had one HHPT that he bonded with (also made mobility gains) and since she left, he has not been the same in regards to mobility (?depressed, motivation).  Pt reports he has not been able to stand fully upright in 2.5 years, also using w/c for mobility approx as long.  Pt remained on 3L O2 [place] for session however attempted to decrease to 2L after transfer and SPO2 89-91 (on 2L) so RN requested leaving pt on 3L at this time.  Daughter is considering SNF since pt typically modified independent with w/c at home however reports slight decline in abilities over the last year.  IF not SNF, then pt may need increased home care upon d/c.     Follow Up Recommendations SNF    Equipment Recommendations  None recommended by PT    Recommendations for Other Services       Precautions / Restrictions Precautions Precautions: Fall Precaution Comments: monitor sats Restrictions Weight Bearing Restrictions: No      Mobility  Bed Mobility Overal bed mobility: Needs Assistance Bed Mobility: Supine to Sit     Supine to sit: Min guard     General bed mobility comments: pt used UE to rise, steady  Transfers Overall transfer level: Needs assistance Equipment used: None Transfers: Lateral/Scoot Transfers          Lateral/Scoot Transfers: Min guard General transfer comment: Pt  able to transfer to recliner with dropped arm from bed, used UE to pull, LE able to help propel but limited strength; remained on 3L O2 [place]  Ambulation/Gait                Stairs            Wheelchair Mobility    Modified Rankin (Stroke Patients Only)       Balance Overall balance assessment: Needs assistance Sitting-balance support: No upper extremity supported;Feet supported Sitting balance-Leahy Scale: Good         Standing balance comment: pt reports not being able to stand fully upright in 2.5 years                             Pertinent Vitals/Pain Pain Assessment: No/denies pain    Home Living Family/patient expects to be discharged to:: Private residence Living Arrangements: Alone Available Help at Discharge: Family;Available PRN/intermittently Type of Home: House Home Access: Ramped entrance     Home Layout: Able to live on main level with bedroom/bathroom Home Equipment: Walker - 4 wheels;Wheelchair - manual Additional Comments: pt reports that he no longer walks, transfers in and out of w/c with arms and legs to propel self    Prior Function Level of Independence: Independent with assistive device(s)         Comments: pt daughter reports that he regularly received HHPT, due to health decline after hospital admissions     Hand Dominance  Extremity/Trunk Assessment   Upper Extremity Assessment Upper Extremity Assessment: Generalized weakness    Lower Extremity Assessment Lower Extremity Assessment: Generalized weakness;LLE deficits/detail;RLE deficits/detail RLE Sensation: history of peripheral neuropathy LLE Sensation: history of peripheral neuropathy       Communication   Communication: No difficulties  Cognition Arousal/Alertness: Awake/alert Behavior During Therapy: WFL for tasks assessed/performed Overall Cognitive Status: Within Functional Limits for tasks assessed                                         General Comments      Exercises     Assessment/Plan    PT Assessment Patient needs continued PT services  PT Problem List Decreased strength;Decreased mobility;Decreased activity tolerance;Decreased balance;Impaired sensation;Decreased knowledge of precautions       PT Treatment Interventions DME instruction;Gait training;Therapeutic exercise;Therapeutic activities;Patient/family education;Functional mobility training;Wheelchair mobility training;Balance training    PT Goals (Current goals can be found in the Care Plan section)  Acute Rehab PT Goals PT Goal Formulation: With patient Time For Goal Achievement: 03/10/17 Potential to Achieve Goals: Good    Frequency Min 2X/week   Barriers to discharge        Co-evaluation               AM-PAC PT "6 Clicks" Daily Activity  Outcome Measure Difficulty turning over in bed (including adjusting bedclothes, sheets and blankets)?: A Lot Difficulty moving from lying on back to sitting on the side of the bed? : A Lot Difficulty sitting down on and standing up from a chair with arms (e.g., wheelchair, bedside commode, etc,.)?: Unable Help needed moving to and from a bed to chair (including a wheelchair)?: Total Help needed walking in hospital room?: Total Help needed climbing 3-5 steps with a railing? : Total 6 Click Score: 8    End of Session Equipment Utilized During Treatment: Oxygen Activity Tolerance: Patient tolerated treatment well Patient left: with chair alarm set;in chair;with call bell/phone within reach Nurse Communication: Mobility status PT Visit Diagnosis: Other abnormalities of gait and mobility (R26.89)    Time: 1130-1149 PT Time Calculation (min) (ACUTE ONLY): 19 min   Charges:   PT Evaluation $PT Eval Low Complexity: 1 Low     PT G CodesCarmelia Bake, PT, DPT 02/24/2017 Pager: [phone]  York Ram E 02/24/2017, 1:31 PM

## 2017-02-24 NOTE — Progress Notes (Signed)
PROGRESS NOTE        PATIENT DETAILS Name: [name] [name] Age: 84 y.o. Sex: male Date of Birth: Feb 17, 1925 Admit Date: 02/23/2017 Admitting Physician [name] Pour, MD [id number], [name] Edelman, MD  Brief Narrative: Patient is a 84 y.o. male with history of CAD status post PCI in 2016, hypertension, dyslipidemia, neuropathy who presented to the hospital with several days history of cough, congestion, fever-he was subsequently diagnosed with influenza, possible UTI and admitted to the hospitalist service.  See below for further details  Subjective: Continues to cough continues-but claims he feels better.  He is lying comfortably in bed.  Assessment/Plan: Acute hypoxic respiratory failure secondary to bronchitis due to influenza: Slowly improving-continue supportive care with Tamiflu, bronchodilators-wheezing is only minimal-hold off on steroids at this time.  Attempt to slowly taper down oxygen as tolerated.  I do not think he requires antibiotics at this time-we will go ahead and stop Zithromax and discontinue with Rocephin to cover for presumed UTI-see below for further details.  ?  UTI: Family/patient acknowledge foul-smelling urine occasional incontinence-his admitting symptoms were mostly respiratory.  For now continue with Rocephin-await culture data.  Acute kidney injury: Mild acute kidney injury likely hemodynamically mediated-improving with supportive care-follow electrolytes periodically  CAD status post PCI 2016: No anginal symptoms.  Any aspirin, beta-blocker and statin.  Hypertension: Stable-continue with Imdur, lisinopril, HCTZ, amlodipine and metoprolol.  BPH: Continue Flomax and finasteride  History of neuropathy: Daughter reports significant amount of neuropathy at baseline-have advised that we hold off on consulting neurology until he recovers from this acute illness.  Acute on chronic debility: Acute worsening due influenza-appreciate PT input.   Continue to  mobilize with nursing staff as much as possible.  DVT Prophylaxis: Prophylactic Lovenox   Code Status: Full code   Family Communication: Daughter at bedside  Disposition Plan: Remain inpatient-but will plan on  SNF on discharge-over the next few days  Antimicrobial agents: Anti-infectives (From admission, onward)   Start     Dose/Rate Route Frequency Ordered Stop   02/24/17 1300  cefTRIAXone (ROCEPHIN) 1 g in sodium chloride 0.9 % 100 mL IVPB     1 g 200 mL/hr over 30 Minutes Intravenous Every 24 hours 02/23/17 2040 03/03/17 1259   02/24/17 1300  azithromycin (ZITHROMAX) 500 mg in sodium chloride 0.9 % 250 mL IVPB     500 mg 250 mL/hr over 60 Minutes Intravenous Every 24 hours 02/23/17 2040 03/03/17 1259   02/24/17 0045  oseltamivir (TAMIFLU) capsule 30 mg     30 mg Oral 2 times daily 02/24/17 0031 02/28/17 2159   02/23/17 1308  azithromycin (ZITHROMAX) 500 MG injection    Comments:  [name] [name]   : cabinet override      02/23/17 1308 02/23/17 1454   02/23/17 1245  cefTRIAXone (ROCEPHIN) 1 g in sodium chloride 0.9 % 100 mL IVPB     1 g 200 mL/hr over 30 Minutes Intravenous  Once 02/23/17 1244 02/23/17 1332   02/23/17 1245  azithromycin (ZITHROMAX) 500 mg in sodium chloride 0.9 % 250 mL IVPB     500 mg 250 mL/hr over 60 Minutes Intravenous  Once 02/23/17 1244 02/23/17 1430      Procedures: None  CONSULTS:  None  Time spent: 25 minutes-Greater than 50% of this time was spent in counseling, explanation of diagnosis, planning of further management, and coordination  of care.  MEDICATIONS: Scheduled Meds: . amLODipine  5 mg Oral Daily  . aspirin  81 mg Oral Daily  . atorvastatin  10 mg Oral Daily  . finasteride  5 mg Oral Daily  . lisinopril  40 mg Oral Daily   And  . hydrochlorothiazide  25 mg Oral Daily  . ipratropium-albuterol  3 mL Nebulization TID  . isosorbide mononitrate  30 mg Oral Daily  . metoprolol tartrate  100 mg Oral BID  . oseltamivir   30 mg Oral BID  . pantoprazole  40 mg Oral Daily  . sodium chloride flush  3 mL Intravenous Q12H  . tamsulosin  0.4 mg Oral QPC supper  . traZODone  50 mg Oral QHS   Continuous Infusions: . sodium chloride    . azithromycin    . cefTRIAXone (ROCEPHIN)  IV 1 g (02/24/17 1301)   PRN Meds:.sodium chloride, acetaminophen, ipratropium-albuterol, ondansetron (ZOFRAN) IV, sodium chloride flush   PHYSICAL EXAM: Vital signs: Vitals:   02/23/17 1844 02/23/17 2211 02/24/17 0349 02/24/17 0635  BP: 127/66 (!) 119/58 131/66   Pulse: (!) 58 66 64   Resp: 20 19 18    Temp: 98.3 F (36.8 C) 99.1 F (37.3 C) 98.5 F (36.9 C)   TempSrc: Oral Oral Oral   SpO2: 93% 97% 95% 93%  Weight: 92.5 kg (203 lb 14.8 oz)     Height: 5\' 10"  (1.778 m)      Filed Weights   02/23/17 1159 02/23/17 1844  Weight: 129.3 kg (285 lb) 92.5 kg (203 lb 14.8 oz)   Body mass index is 29.26 kg/m.   General appearance :Awake, alert, not in any distress Eyes:, pupils equally reactive to light and accomodation HEENT: Atraumatic and Normocephalic Neck: supple, no JVD. No cervical lymphadenopathy.  Resp:Good air entry bilaterally, few bibasilar rales-some scattered rhonchi. CVS: S1 S2 regular, no murmurs.  GI: Bowel sounds present, Non tender and not distended with no gaurding, rigidity or rebound.No organomegaly Extremities: B/L Lower Ext shows no edema, both legs are warm to touch Neurology:  speech clear,Non focal, sensation is grossly intact. Psychiatric: Normal judgment and insight. Alert and oriented x 3. Musculoskeletal:No digital cyanosis Skin:No Rash, warm and dry Wounds:N/A  I have personally reviewed following labs and imaging studies  LABORATORY DATA: CBC: Recent Labs  Lab 02/23/17 1231 02/24/17 0509  WBC 6.2 4.9  NEUTROABS 4.4 3.1  HGB 14.6 13.9  HCT 44.2 43.1  MCV 95.3 96.4  PLT 182 354    Basic Metabolic Panel: Recent Labs  Lab 02/23/17 1231 02/24/17 0509  NA 138 139  K 4.0 4.0  CL  99* 100*  CO2 29 29  GLUCOSE 100* 91  BUN 26* 25*  CREATININE 1.51* 1.37*  CALCIUM 9.1 8.8*    GFR: Estimated Creatinine Clearance: 41.8 mL/min (A) (by C-G formula based on SCr of 1.37 mg/dL (H)).  Liver Function Tests: Recent Labs  Lab 02/23/17 1231  AST 33  ALT 27  ALKPHOS 83  BILITOT 0.7  PROT 7.1  ALBUMIN 3.6   No results for input(s): LIPASE, AMYLASE in the last 168 hours. No results for input(s): AMMONIA in the last 168 hours.  Coagulation Profile: No results for input(s): INR, PROTIME in the last 168 hours.  Cardiac Enzymes: No results for input(s): CKTOTAL, CKMB, CKMBINDEX, TROPONINI in the last 168 hours.  BNP (last 3 results) No results for input(s): PROBNP in the last 8760 hours.  HbA1C: No results for input(s): HGBA1C in the last 72  hours.  CBG: No results for input(s): GLUCAP in the last 168 hours.  Lipid Profile: No results for input(s): CHOL, HDL, LDLCALC, TRIG, CHOLHDL, LDLDIRECT in the last 72 hours.  Thyroid Function Tests: No results for input(s): TSH, T4TOTAL, FREET4, T3FREE, THYROIDAB in the last 72 hours.  Anemia Panel: No results for input(s): VITAMINB12, FOLATE, FERRITIN, TIBC, IRON, RETICCTPCT in the last 72 hours.  Urine analysis:    Component Value Date/Time   COLORURINE YELLOW 02/23/2017 1350   APPEARANCEUR CLOUDY (A) 02/23/2017 1350   LABSPEC 1.020 02/23/2017 1350   PHURINE 6.0 02/23/2017 1350   GLUCOSEU NEGATIVE 02/23/2017 1350   HGBUR NEGATIVE 02/23/2017 1350   BILIRUBINUR NEGATIVE 02/23/2017 1350   KETONESUR NEGATIVE 02/23/2017 1350   PROTEINUR NEGATIVE 02/23/2017 1350   UROBILINOGEN 0.2 11/09/2014 1532   NITRITE NEGATIVE 02/23/2017 1350   LEUKOCYTESUR TRACE (A) 02/23/2017 1350    Sepsis Labs: Lactic Acid, Venous    Component Value Date/Time   LATICACIDVEN 0.88 02/23/2017 1253    MICROBIOLOGY: No results found for this or any previous visit (from the past 240 hour(s)).  RADIOLOGY STUDIES/RESULTS: Dg Chest 2  View  Result Date: 02/23/2017 CLINICAL DATA:  Cough and fever EXAM: CHEST  2 VIEW COMPARISON:  February 22, 2017 FINDINGS: There is atelectatic change in the right base. Lungs elsewhere are clear. The heart is borderline enlarged with pulmonary venous hypertension. There is aortic atherosclerosis. No adenopathy. No bone lesions. IMPRESSION: Atelectasis right base. No edema or consolidation. Underlying pulmonary vascular congestion. There is aortic atherosclerosis. Aortic Atherosclerosis (ICD10-I70.0). Electronically Signed   By: Lowella Grip III M.D.   On: 02/23/2017 12:26     LOS: 1 day   Oren Binet, MD  [facility] Pager:[phone]  If 7PM-7AM, please contact night-coverage [email or web address] Password [facility] 02/24/2017, 1:39 PM

## 2017-02-24 NOTE — Progress Notes (Signed)
OT Cancellation Note  Patient Details Name: [name] [name] MRN: [id number] DOB: 06/09/1925   Cancelled Treatment:    Reason Eval/Treat Not Completed: Other (comment). Another staff member is with pt. I will check back tomorrow.  Cranford Blessinger 02/24/2017, 3:44 PM  Lesle Chris, OTR/L [phone] 02/24/2017

## 2017-02-25 DIAGNOSIS — J101 Influenza due to other identified influenza virus with other respiratory manifestations: Secondary | ICD-10-CM

## 2017-02-25 DIAGNOSIS — I2510 Atherosclerotic heart disease of native coronary artery without angina pectoris: Secondary | ICD-10-CM

## 2017-02-25 LAB — BASIC METABOLIC PANEL
Anion gap: 11 (ref 5–15)
BUN: 27 mg/dL — AB (ref 6–20)
CHLORIDE: 100 mmol/L — AB (ref 101–111)
CO2: 28 mmol/L (ref 22–32)
Calcium: 8.6 mg/dL — ABNORMAL LOW (ref 8.9–10.3)
Creatinine, Ser: 1.16 mg/dL (ref 0.61–1.24)
GFR calc Af Amer: >60 mL/min (ref >60)
GFR calc non Af Amer: 54 mL/min — ABNORMAL LOW (ref >60)
GLUCOSE: 100 mg/dL — AB (ref 65–99)
POTASSIUM: 3.9 mmol/L (ref 3.5–5.1)
Sodium: 139 mmol/L (ref 135–145)

## 2017-02-25 LAB — CBC
HCT: 42.5 % (ref 39.0–52.0)
Hemoglobin: 13.9 g/dL (ref 13.0–17.0)
MCH: 31.2 pg (ref 26.0–34.0)
MCHC: 32.7 g/dL (ref 30.0–36.0)
MCV: 95.3 fL (ref 78.0–100.0)
PLATELETS: 160 10*3/uL (ref 150–400)
RBC: 4.46 MIL/uL (ref 4.22–5.81)
RDW: 13.3 % (ref 11.5–15.5)
WBC: 5 10*3/uL (ref 4.0–10.5)

## 2017-02-25 MED ORDER — OXYMETAZOLINE HCL 0.05 % NA SOLN
1.0000 | Freq: Two times a day (BID) | NASAL | Status: DC
  Administered 2017-02-25 – 2017-02-26 (×3): 1 via NASAL
  Filled 2017-02-25: qty 15

## 2017-02-25 MED ORDER — MAGNESIUM SULFATE 2 GM/50ML IV SOLN
2.0000 g | Freq: Once | INTRAVENOUS | Status: AC
  Administered 2017-02-25: 2 g via INTRAVENOUS
  Filled 2017-02-25: qty 50

## 2017-02-25 MED ORDER — LORATADINE 10 MG PO TABS
10.0000 mg | ORAL_TABLET | Freq: Every day | ORAL | Status: DC
  Administered 2017-02-25 – 2017-02-27 (×3): 10 mg via ORAL
  Filled 2017-02-25 (×3): qty 1

## 2017-02-25 MED ORDER — METHYLPREDNISOLONE SODIUM SUCC 40 MG IJ SOLR
40.0000 mg | Freq: Three times a day (TID) | INTRAMUSCULAR | Status: DC
  Administered 2017-02-25 – 2017-02-26 (×3): 40 mg via INTRAVENOUS
  Filled 2017-02-25 (×3): qty 1

## 2017-02-25 NOTE — Progress Notes (Signed)
PROGRESS NOTE        PATIENT DETAILS Name: [name] [name] Age: 84 y.o. Sex: male Date of Birth: 21-Feb-1925 Admit Date: 02/23/2017 Admitting Physician Patrecia Pour, MD [id number], [name], MD  Brief Narrative: Patient is a 84 y.o. male with history of CAD status post PCI in 2016, hypertension, dyslipidemia, neuropathy who presented to the hospital with several days history of cough, congestion, fever-he was subsequently diagnosed with influenza, possible UTI and admitted to the hospitalist service.  See below for further details  Subjective: Breathing better-cough continues-thinks that he is overall better compared to the past few days  Assessment/Plan: Acute hypoxic respiratory failure secondary to bronchitis due to influenza: Slowly improving with Tamiflu, bronchodilators-since he continues to wheeze somewhat-we will start a few days of steroids.  Attempt to taper down oxygen as tolerated.  Encourage incentive spirometry, flutter valve-out of bed to chair.  ?  UTI vs asymptomatic bacteriuria: Although the patient did acknowledge some foul-smelling urine-almost all of his symptoms/complaints are respiratory.  Urine culture is negative-we will stop Rocephin.    Acute kidney injury: Likely hemodynamically mediated, acute kidney injury has resolved with supportive care.    CAD status post PCI 2016: Without any anginal symptoms.  Continue aspirin, beta-blocker and statin.  Hypertension: Stable-continue with Imdur, lisinopril, HCTZ, amlodipine and metoprolol.  BPH: Continue Flomax and finasteride  History of neuropathy: Daughter reports significant amount of neuropathy at baseline-have advised that we hold off on consulting neurology until he recovers from this acute illness.  Acute on chronic debility: Acute worsening due influenza-mostly wheelchair-bound per family.  Appreciate PT input-we will ask case management/social work to see if we can transfer to SNF on  discharge.  DVT Prophylaxis: Prophylactic Lovenox   Code Status: Full code   Family Communication: Daughter at bedside  Disposition Plan: Remain inpatient-SNF later this week  Antimicrobial agents: Anti-infectives (From admission, onward)   Start     Dose/Rate Route Frequency Ordered Stop   02/24/17 1300  cefTRIAXone (ROCEPHIN) 1 g in sodium chloride 0.9 % 100 mL IVPB     1 g 200 mL/hr over 30 Minutes Intravenous Every 24 hours 02/23/17 2040 03/03/17 1259   02/24/17 1300  azithromycin (ZITHROMAX) 500 mg in sodium chloride 0.9 % 250 mL IVPB  Status:  Discontinued     500 mg 250 mL/hr over 60 Minutes Intravenous Every 24 hours 02/23/17 2040 02/24/17 1353   02/24/17 0045  oseltamivir (TAMIFLU) capsule 30 mg     30 mg Oral 2 times daily 02/24/17 0031 02/28/17 2159   02/23/17 1308  azithromycin (ZITHROMAX) 500 MG injection    Comments:  Ilda Basset   : cabinet override      02/23/17 1308 02/23/17 1454   02/23/17 1245  cefTRIAXone (ROCEPHIN) 1 g in sodium chloride 0.9 % 100 mL IVPB     1 g 200 mL/hr over 30 Minutes Intravenous  Once 02/23/17 1244 02/23/17 1332   02/23/17 1245  azithromycin (ZITHROMAX) 500 mg in sodium chloride 0.9 % 250 mL IVPB     500 mg 250 mL/hr over 60 Minutes Intravenous  Once 02/23/17 1244 02/23/17 1430      Procedures: None  CONSULTS:  None  Time spent: 25 minutes-Greater than 50% of this time was spent in counseling, explanation of diagnosis, planning of further management, and coordination of care.  MEDICATIONS: Scheduled Meds: .  amLODipine  5 mg Oral Daily  . aspirin  81 mg Oral Daily  . atorvastatin  10 mg Oral Daily  . enoxaparin (LOVENOX) injection  40 mg Subcutaneous Q24H  . finasteride  5 mg Oral Daily  . fluticasone  2 spray Each Nare Daily  . guaiFENesin  600 mg Oral BID  . lisinopril  40 mg Oral Daily   And  . hydrochlorothiazide  25 mg Oral Daily  . ipratropium-albuterol  3 mL Nebulization TID  . isosorbide mononitrate  30 mg  Oral Daily  . loratadine  10 mg Oral Daily  . methylPREDNISolone (SOLU-MEDROL) injection  40 mg Intravenous Q8H  . metoprolol tartrate  100 mg Oral BID  . oseltamivir  30 mg Oral BID  . oxymetazoline  1 spray Each Nare BID  . pantoprazole  40 mg Oral Daily  . sodium chloride flush  3 mL Intravenous Q12H  . tamsulosin  0.4 mg Oral QPC supper  . traZODone  50 mg Oral QHS   Continuous Infusions: . sodium chloride    . cefTRIAXone (ROCEPHIN)  IV 1 g (02/25/17 1137)   PRN Meds:.sodium chloride, acetaminophen, ipratropium-albuterol, ondansetron (ZOFRAN) IV, sodium chloride flush   PHYSICAL EXAM: Vital signs: Vitals:   02/24/17 2105 02/25/17 0425 02/25/17 0838 02/25/17 0943  BP:  (!) 166/66  (!) 165/68  Pulse:  63  62  Resp:  18    Temp:  98.5 F (36.9 C)    TempSrc:  Oral    SpO2: 94% 96% 97%   Weight:      Height:       Filed Weights   02/23/17 1159 02/23/17 1844  Weight: 129.3 kg (285 lb) 92.5 kg (203 lb 14.8 oz)   Body mass index is 29.26 kg/m.   General appearance :Awake, alert, not in any distress.  Eyes:, pupils equally reactive to light and accomodation,no scleral icterus. HEENT: Atraumatic and Normocephalic Neck: supple, no JVD. Resp:Good air entry bilaterally, scattered rhonchi all over CVS: S1 S2 regular, no murmurs.  GI: Bowel sounds present, Non tender and not distended with no gaurding, rigidity or rebound. Extremities: B/L Lower Ext shows no edema, both legs are warm to touch Neurology: Nonfocal-but with generalized weakness all over Psychiatric: Normal judgment and insight. Normal mood. Musculoskeletal:No digital cyanosis Skin:No Rash, warm and dry Wounds:N/A  I have personally reviewed following labs and imaging studies  LABORATORY DATA: CBC: Recent Labs  Lab 02/23/17 1231 02/24/17 0509 02/25/17 0558  WBC 6.2 4.9 5.0  NEUTROABS 4.4 3.1  --   HGB 14.6 13.9 13.9  HCT 44.2 43.1 42.5  MCV 95.3 96.4 95.3  PLT 182 160 564    Basic Metabolic  Panel: Recent Labs  Lab 02/23/17 1231 02/24/17 0509 02/25/17 0558  NA 138 139 139  K 4.0 4.0 3.9  CL 99* 100* 100*  CO2 29 29 28   GLUCOSE 100* 91 100*  BUN 26* 25* 27*  CREATININE 1.51* 1.37* 1.16  CALCIUM 9.1 8.8* 8.6*    GFR: Estimated Creatinine Clearance: 49.3 mL/min (by C-G formula based on SCr of 1.16 mg/dL).  Liver Function Tests: Recent Labs  Lab 02/23/17 1231  AST 33  ALT 27  ALKPHOS 83  BILITOT 0.7  PROT 7.1  ALBUMIN 3.6   No results for input(s): LIPASE, AMYLASE in the last 168 hours. No results for input(s): AMMONIA in the last 168 hours.  Coagulation Profile: No results for input(s): INR, PROTIME in the last 168 hours.  Cardiac Enzymes: No  results for input(s): CKTOTAL, CKMB, CKMBINDEX, TROPONINI in the last 168 hours.  BNP (last 3 results) No results for input(s): PROBNP in the last 8760 hours.  HbA1C: No results for input(s): HGBA1C in the last 72 hours.  CBG: No results for input(s): GLUCAP in the last 168 hours.  Lipid Profile: No results for input(s): CHOL, HDL, LDLCALC, TRIG, CHOLHDL, LDLDIRECT in the last 72 hours.  Thyroid Function Tests: No results for input(s): TSH, T4TOTAL, FREET4, T3FREE, THYROIDAB in the last 72 hours.  Anemia Panel: No results for input(s): VITAMINB12, FOLATE, FERRITIN, TIBC, IRON, RETICCTPCT in the last 72 hours.  Urine analysis:    Component Value Date/Time   COLORURINE YELLOW 02/23/2017 1350   APPEARANCEUR CLOUDY (A) 02/23/2017 1350   LABSPEC 1.020 02/23/2017 1350   PHURINE 6.0 02/23/2017 1350   GLUCOSEU NEGATIVE 02/23/2017 1350   HGBUR NEGATIVE 02/23/2017 1350   BILIRUBINUR NEGATIVE 02/23/2017 1350   KETONESUR NEGATIVE 02/23/2017 1350   PROTEINUR NEGATIVE 02/23/2017 1350   UROBILINOGEN 0.2 11/09/2014 1532   NITRITE NEGATIVE 02/23/2017 1350   LEUKOCYTESUR TRACE (A) 02/23/2017 1350    Sepsis Labs: Lactic Acid, Venous    Component Value Date/Time   LATICACIDVEN 0.88 02/23/2017 1253     MICROBIOLOGY: Recent Results (from the past 240 hour(s))  Culture, blood (routine x 2)     Status: None (Preliminary result)   Collection Time: 02/23/17 12:30 PM  Result Value Ref Range Status   Specimen Description   Final    BLOOD RIGHT ANTECUBITAL Performed at [facility], [address] [place], [place] [address]    Special Requests   Final    BOTTLES DRAWN AEROBIC AND ANAEROBIC Blood Culture adequate volume Performed at [facility], [address] [place], [place] [address]    Culture   Final    NO GROWTH < 24 HOURS Performed at [facility], [place] [address], [place], [place] [address]    Report Status PENDING  Incomplete  Culture, blood (routine x 2)     Status: None (Preliminary result)   Collection Time: 02/23/17 12:55 PM  Result Value Ref Range Status   Specimen Description   Final    BLOOD BLOOD RIGHT FOREARM Performed at [facility], [address] [place], [place] [address]    Special Requests   Final    BOTTLES DRAWN AEROBIC AND ANAEROBIC Blood Culture adequate volume Performed at [facility], [address] [place], [place] [address]    Culture   Final    NO GROWTH < 24 HOURS Performed at [facility], [place] [address] [place], [place] [address]    Report Status PENDING  Incomplete  Urine culture     Status: Abnormal   Collection Time: 02/23/17  1:50 PM  Result Value Ref Range Status   Specimen Description   Final    URINE, RANDOM Performed at [facility], [address] [place], [place] [address]    Special Requests   Final    NONE Performed at [facility], [address] [place], [place] [address]    Culture (A)  Final    <10,000 COLONIES/mL INSIGNIFICANT GROWTH Performed at [facility], [place] [address], [place],  [address]    Report Status 02/24/2017 FINAL  Final    RADIOLOGY STUDIES/RESULTS: Dg Chest 2 View  Result Date:  02/23/2017 CLINICAL DATA:  Cough and fever  EXAM: CHEST  2 VIEW COMPARISON:  February 22, 2017 FINDINGS: There is atelectatic change in the right base. Lungs elsewhere are clear. The heart is borderline enlarged with pulmonary venous hypertension. There is aortic atherosclerosis. No adenopathy. No bone lesions. IMPRESSION: Atelectasis right base. No edema or consolidation. Underlying pulmonary vascular congestion. There is aortic atherosclerosis. Aortic Atherosclerosis (ICD10-I70.0). Electronically Signed   By: Lowella Grip III M.D.   On: 02/23/2017 12:26     LOS: 2 days   Oren Binet, MD  [facility] Pager:[phone]  If 7PM-7AM, please contact night-coverage [email or web address] Password [facility] 02/25/2017, 11:55 AM

## 2017-02-25 NOTE — Clinical Social Work Note (Signed)
Clinical Social Work Assessment  Patient Details  Name: [name] [name] MRN: [id number] Date of Birth: 07/15/1925  Date of referral:  02/25/17               Reason for consult:  Facility Placement                Permission sought to share information with:  Facility Sport and exercise psychologist, Family Supports Permission granted to share information::  Yes, Verbal Permission Granted  Name::     [name] [name]  Agency::     Relationship::  Daughter   Contact Information:  [phone]  Housing/Transportation Living arrangements for the past 2 months:  [place] of Information:  Patient, Adult Children Patient Interpreter Needed:  None Criminal Activity/Legal Involvement Pertinent to Current Situation/Hospitalization:  No - Comment as needed Significant Relationships:  Adult Children Lives with:  Self Do you feel safe going back to the place where you live?  (PT recommending SNF) Need for family participation in patient care:  Yes (Comment)  Care giving concerns:  Patient from home alone. Patient's daughter reported that patient uses a wheelchair at home and patient was mostly independent at baseline. Patient's daughter reported that one of the daughter's manages patient's medications and brought patient grocery. Patient's daughter reported that patient has housekeeping once a month and 3 days week he participates in [employer] and [employer] at [employer]. PT recommending SNF for ST rehab.   Social Worker assessment / plan:  CSW spoke with patient at bedside regarding PT recommendation for SNF. Patient reported that he has been to SNF in the past. Patient reported that he is agreeable to SNF for ST rehab. Patient granted CSW verbal permission to contact his daughter to discuss discharge planning further.  CSW contacted patient's daughter [name] [name] [phone]) to discuss PT recommendation for SNF. Patient's daughter reported that she is agreeable  to SNF. Patient's daughter reported that she and her sisters are having a conversation about patient's Stephanie Littman term care moving forward. Patient's daughter put CSW on speaker phone so patient's other daughters could ask questions about placement process. CSW answered questions and provided requested contact information for DSS to follow up about Shalena Ezzell term care medicaid.  CSW will complete FL2 and follow up with bed offers.  CSW will continue to follow and assist with discharge planning.  Employment status:  Retired Forensic scientist:  Medicare PT Recommendations:  [place] / Referral to community resources:  [place]  Patient/Family's Response to care:  Patient/patient's daughters agreeable to SNF. Patient's daughters appreciative of CSW assistance with discharge planning.  Patient/Family's Understanding of and Emotional Response to Diagnosis, Current Treatment, and Prognosis:  Patient presented quiet and deferred to his daughters to discuss discharge planning further. Patient's daughters involved in patient's care and are proactive about patient's Sreenidhi Ganson term care plans moving forward. CSW provided psychoeducation about levels of care and Carmine Carrozza term care payor sources. Patient's daughter verbalized plan for patient to dc to SNF for ST rehab and to follow up with community resources about Jacora Hopkins term care for patient.   Emotional Assessment Appearance:  Appears stated age Attitude/Demeanor/Rapport:  Other(Cooperative) Affect (typically observed):  Quiet, Appropriate Orientation:  Oriented to Self, Oriented to Place, Oriented to  Time, Oriented to Situation Alcohol / Substance use:  Not Applicable Psych involvement (Current and /or in the community):  No (Comment)  Discharge Needs  Concerns to be addressed:  Care Coordination Readmission within the last 30 days:  No Current discharge risk:  Lives alone, Physical Impairment Barriers to Discharge:   Continued Medical Work up   [employer], LCSW 02/25/2017, 2:48 PM

## 2017-02-25 NOTE — Evaluation (Signed)
Occupational Therapy Evaluation Patient Details Name: [name] [name] MRN: [id number] DOB: 1925-03-29 Today's Date: 02/25/2017    History of Present Illness 84 y.o. male with medical history significant of coronary artery disease, hypertension, hyperlipidemia. Pt found to have pneumonia, + influenza A.    Clinical Impression   Pt was admitted for the above. At baseline, he lives alone and states he does the best he can for adls.  He performs lateral scoot transfers.  Pt needed min guard to min A for mobility and up to mod A for LB adls, with leaning from sitting. Pt is following swallowing strategies with supervision/cues at this time    Follow Up Recommendations  SNF    Equipment Recommendations  (possibly drop arm commode:  tba further)    Recommendations for Other Services       Precautions / Restrictions Precautions Precautions: Fall Precaution Comments: monitor sats Restrictions Weight Bearing Restrictions: No      Mobility Bed Mobility         Supine to sit: Min assist     General bed mobility comments: min A for trunk to sit up and support when scooting to EOB  Transfers     Transfers: Lateral/Scoot Transfers          Lateral/Scoot Transfers: Min guard General transfer comment: for safety.  Use of arm on chair    Balance                                           ADL either performed or assessed with clinical judgement   ADL Overall ADL's : Needs assistance/impaired Eating/Feeding: Supervision/ safety Eating/Feeding Details (indicate cue type and reason): cues for bite size; double swallows and pacing.   pt alternates most of the time without cues Grooming: Set up;Sitting   Upper Body Bathing: Set up;Sitting   Lower Body Bathing: Moderate assistance;Sitting/lateral leans   Upper Body Dressing : Set up;Sitting   Lower Body Dressing: Maximal assistance;Sitting/lateral leans   Toilet Transfer: Min guard(lateral transfer to drop arm  recliner)   Toileting- Clothing Manipulation and Hygiene: Moderate assistance;Sitting/lateral lean         General ADL Comments: got up to chair and ate wtih supervision.       Vision         Perception     Praxis      Pertinent Vitals/Pain Pain Assessment: Faces Faces Pain Scale: Hurts a little bit Pain Location: abdomen and ches Pain Intervention(s): Limited activity within patient's tolerance;Monitored during session;Repositioned     Hand Dominance     Extremity/Trunk Assessment Upper Extremity Assessment Upper Extremity Assessment: Generalized weakness           Communication Communication Communication: HOH   Cognition Arousal/Alertness: Awake/alert Behavior During Therapy: WFL for tasks assessed/performed Overall Cognitive Status: Within Functional Limits for tasks assessed                                     General Comments       Exercises     Shoulder Instructions      Home Living   Living Arrangements: Alone Available Help at Discharge: Family;Available PRN/intermittently                         Home Equipment:  Walker - 4 wheels;Wheelchair - manual          Prior Functioning/Environment Level of Independence: Independent with assistive device(s)        Comments: pt daughter reports that he regularly received HHPT, due to health decline after hospital admissions        OT Problem List: Decreased strength;Decreased activity tolerance;Decreased knowledge of use of DME or AE;Cardiopulmonary status limiting activity;Pain      OT Treatment/Interventions: Self-care/ADL training;Energy conservation;DME and/or AE instruction;Patient/family education    OT Goals(Current goals can be found in the care plan section) Acute Rehab OT Goals Patient Stated Goal: none stated OT Goal Formulation: With patient Time For Goal Achievement: 03/11/17 Potential to Achieve Goals: Good ADL Goals Pt Will Perform Lower Body  Bathing: with adaptive equipment;sitting/lateral leans;with set-up;with supervision Pt Will Perform Lower Body Dressing: with set-up;with supervision;with adaptive equipment;sitting/lateral leans Pt Will Transfer to Toilet: with supervision(lateral scoot to toilet vs drop arm over toilet) Pt Will Perform Toileting - Clothing Manipulation and hygiene: with modified independence;sitting/lateral leans  OT Frequency: Min 2X/week   Barriers to D/C:            Co-evaluation              AM-PAC PT "6 Clicks" Daily Activity     Outcome Measure Help from another person eating meals?: A Little Help from another person taking care of personal grooming?: A Little Help from another person toileting, which includes using toliet, bedpan, or urinal?: A Lot Help from another person bathing (including washing, rinsing, drying)?: A Lot Help from another person to put on and taking off regular upper body clothing?: A Little Help from another person to put on and taking off regular lower body clothing?: A Lot 6 Click Score: 15   End of Session    Activity Tolerance: Patient tolerated treatment well Patient left: in chair;with call bell/phone within reach;with chair alarm set  OT Visit Diagnosis: Muscle weakness (generalized) (M62.81)                Time: 3833-3832 OT Time Calculation (min): 40 min Charges:  OT General Charges $OT Visit: 1 Visit OT Evaluation $OT Eval Low Complexity: 1 Low OT Treatments $Self Care/Home Management : 23-37 mins G-Codes:     Hiram, OTR/L [phone] 02/25/2017  [name] [name] 02/25/2017, 11:59 AM

## 2017-02-25 NOTE — Progress Notes (Signed)
Occupational Therapy Treatment Patient Details Name: [name] [name] MRN: [id number] DOB: 05-Nov-1925 Today's Date: 02/25/2017    History of present illness 84 y.o. male with medical history significant of coronary artery disease, hypertension, hyperlipidemia. Pt found to have pneumonia, + influenza A.    OT comments  Pt sat up in chair for greater than an hour and wanted to get back to bed.  NT was busy. Pt felt he needed +2; RN stood by but transfer was min +1 assistance.    Follow Up Recommendations  SNF    Equipment Recommendations  (possibly drop arm commode)    Recommendations for Other Services      Precautions / Restrictions Precautions Precautions: Fall Precaution Comments: monitor sats Restrictions Weight Bearing Restrictions: No       Mobility Bed Mobility         Supine to sit: Min assist Sit to supine: Min assist;Mod assist   General bed mobility comments: assist for bil LEs  Transfers     Transfers: Lateral/Scoot Transfers          Lateral/Scoot Transfers: Min assist General transfer comment: for back to bed going slightly uphill; cues to scoot forward    Balance                                           ADL either performed or assessed with clinical judgement   ADL Overall ADL's : Needs assistance/impaired Eating/Feeding: Supervision/ safety Eating/Feeding Details (indicate cue type and reason): cues for bite size.l  pt alternates most of the time without cues Grooming: Set up;Sitting   Upper Body Bathing: Set up;Sitting   Lower Body Bathing: Moderate assistance;Sitting/lateral leans   Upper Body Dressing : Set up;Sitting   Lower Body Dressing: Maximal assistance;Sitting/lateral leans   Toilet Transfer: Minimal assistance(lateral transfer back to bed, slightly uphill)   Toileting- Clothing Manipulation and Hygiene: Moderate assistance;Sitting/lateral lean         General ADL Comments: pt requested +2 assistance; RN  stood by.       Vision       Perception     Praxis      Cognition Arousal/Alertness: Awake/alert Behavior During Therapy: WFL for tasks assessed/performed Overall Cognitive Status: Within Functional Limits for tasks assessed                                          Exercises     Shoulder Instructions       General Comments      Pertinent Vitals/ Pain       Pain Assessment: No/denies pain Faces Pain Scale: Hurts a little bit Pain Location: abdomen and ches Pain Intervention(s): Limited activity within patient's tolerance;Monitored during session;Repositioned  Home Living   Living Arrangements: Alone Available Help at Discharge: Family;Available PRN/intermittently                         Home Equipment: Walker - 4 wheels;Wheelchair - manual          Prior Functioning/Environment Level of Independence: Independent with assistive device(s)        Comments: pt daughter reports that he regularly received HHPT, due to health decline after hospital admissions   Frequency  Min 2X/week  Progress Toward Goals  OT Goals(current goals can now be found in the care plan section)  Progress towards OT goals: Progressing toward goals  Acute Rehab OT Goals Patient Stated Goal: none stated OT Goal Formulation: With patient Time For Goal Achievement: 03/11/17 Potential to Achieve Goals: Good ADL Goals Pt Will Perform Lower Body Bathing: with adaptive equipment;sitting/lateral leans;with set-up;with supervision Pt Will Perform Lower Body Dressing: with set-up;with supervision;with adaptive equipment;sitting/lateral leans Pt Will Transfer to Toilet: with supervision(lateral scoot to toilet vs drop arm over toilet) Pt Will Perform Toileting - Clothing Manipulation and hygiene: with modified independence;sitting/lateral leans  Plan      Co-evaluation                 AM-PAC PT "6 Clicks" Daily Activity     Outcome Measure    Help from another person eating meals?: A Little Help from another person taking care of personal grooming?: A Little Help from another person toileting, which includes using toliet, bedpan, or urinal?: A Lot Help from another person bathing (including washing, rinsing, drying)?: A Lot Help from another person to put on and taking off regular upper body clothing?: A Little Help from another person to put on and taking off regular lower body clothing?: A Lot 6 Click Score: 15    End of Session    OT Visit Diagnosis: Muscle weakness (generalized) (M62.81)   Activity Tolerance Patient tolerated treatment well   Patient Left in bed;with call bell/phone within reach;with bed alarm set   Nurse Communication          Time: 9166-0600 OT Time Calculation (min): 8 min  Charges: OT General Charges $OT Visit: 1 Visit OT Evaluation $OT Eval Low Complexity: 1 Low OT Treatments $Self Care/Home Management : 23-37 mins $Therapeutic Activity: 8-22 mins  Lesle Chris, OTR/L [phone] 02/25/2017   [name] 02/25/2017, 12:54 PM

## 2017-02-25 NOTE — Progress Notes (Signed)
Patient had a 20 second run of vtach. Pt asymptomatic. VSS. MD on call notified. New order placed. Will continue to monitor closely.

## 2017-02-26 DIAGNOSIS — J111 Influenza due to unidentified influenza virus with other respiratory manifestations: Secondary | ICD-10-CM

## 2017-02-26 LAB — BASIC METABOLIC PANEL
Anion gap: 11 (ref 5–15)
BUN: 25 mg/dL — ABNORMAL HIGH (ref 6–20)
CO2: 27 mmol/L (ref 22–32)
CREATININE: 1.11 mg/dL (ref 0.61–1.24)
Calcium: 8.9 mg/dL (ref 8.9–10.3)
Chloride: 100 mmol/L — ABNORMAL LOW (ref 101–111)
GFR calc Af Amer: >60 mL/min (ref >60)
GFR calc non Af Amer: 57 mL/min — ABNORMAL LOW (ref >60)
GLUCOSE: 154 mg/dL — AB (ref 65–99)
POTASSIUM: 4.1 mmol/L (ref 3.5–5.1)
SODIUM: 138 mmol/L (ref 135–145)

## 2017-02-26 LAB — MAGNESIUM: Magnesium: 2 mg/dL (ref 1.7–2.4)

## 2017-02-26 MED ORDER — METHYLPREDNISOLONE SODIUM SUCC 40 MG IJ SOLR: 40.0000 mg | INTRAMUSCULAR | Status: DC

## 2017-02-26 MED ORDER — METHYLPREDNISOLONE SODIUM SUCC 40 MG IJ SOLR
40.0000 mg | Freq: Two times a day (BID) | INTRAMUSCULAR | Status: DC
  Administered 2017-02-26 – 2017-02-27 (×2): 40 mg via INTRAVENOUS
  Filled 2017-02-26 (×3): qty 1

## 2017-02-26 NOTE — NC FL2 (Signed)
[place] LEVEL OF CARE SCREENING TOOL     IDENTIFICATION  Patient Name: [name] [name] Birthdate: Dec 18, 1925 Sex: male Admission Date (Current Location): 02/23/2017  [facility] and [place] Number:  Herbalist and Address:  [facility],  [place] [address] [place]      Provider Number: [id number]  Attending Physician Name and Address:  Jonetta Osgood, MD  Relative Name and Phone Number:       Current Level of Care: Hospital Recommended Level of Care: [place] Prior Approval Number:    Date Approved/Denied:   PASRR Number:  [id number]  Discharge Plan: SNF    Current Diagnoses: Patient Active Problem List   Diagnosis Date Noted  . Acute respiratory failure with hypoxia ([place]) 02/23/2017  . CAP (community acquired pneumonia) 02/23/2017  . Nausea & vomiting 02/23/2017  . PTSD (post-traumatic stress disorder)   . Vomiting and diarrhea 11/09/2014  . Unstable angina ([place]) 05/10/2014  . Coronary artery disease due to lipid rich plaque   . Angina pectoris, crescendo ([place]) 04/28/2014  . Contusion of left hip 07/04/2013  . Fall 07/04/2013  . BPH (benign prostatic hypertrophy) 05/31/2013  . CAD (coronary artery disease) 05/31/2013  . UTI (urinary tract infection) 05/31/2013  . Hyperlipidemia 05/31/2013  . Dysphagia, pharyngoesophageal phase 04/10/2013  . Frequent falls 04/10/2013  . Weakness 04/10/2013  . Hyperthyroidism, subclinical 04/10/2013  . GERD (gastroesophageal reflux disease) 04/06/2013  . Bladder outflow obstruction 04/06/2013  . Dehydration 04/05/2013  . Hypertension 04/05/2013  . Arthritis 04/05/2013  . Peripheral neuropathy 04/05/2013  . Gastroenteritis, acute 04/05/2013  . Gastroenteritis 04/05/2013    Orientation RESPIRATION BLADDER Height & Weight     Self, Time, Situation, Place  O2 Continent Weight: 203 lb 14.8 oz (92.5 kg) Height:  5\' 10"  (177.8 cm)  BEHAVIORAL SYMPTOMS/MOOD NEUROLOGICAL  BOWEL NUTRITION STATUS        Diet(see dc summary)  AMBULATORY STATUS COMMUNICATION OF NEEDS Skin   Extensive Assist Verbally Normal                       Personal Care Assistance Level of Assistance  Bathing, Feeding, Dressing Bathing Assistance: Maximum assistance Feeding assistance: Independent Dressing Assistance: Maximum assistance     Functional Limitations Info  Sight, Hearing, Speech Sight Info: Adequate Hearing Info: Impaired Speech Info: Adequate    SPECIAL CARE FACTORS FREQUENCY  PT (By licensed PT), OT (By licensed OT)     PT Frequency: 5x/week OT Frequency: 5x/week            Contractures Contractures Info: Not present    Additional Factors Info  Code Status, Allergies, Isolation Precautions Code Status Info: Full Code Allergies Info: Cortisone      Isolation Precautions Info: Droplet Precautions     Current Medications (02/26/2017):  This is the current hospital active medication list Current Facility-Administered Medications  Medication Dose Route Frequency Provider Last Rate Last Dose  . 0.9 %  sodium chloride infusion  250 mL Intravenous PRN Derrill Kay A, MD      . acetaminophen (TYLENOL) tablet 500-1,000 mg  500-1,000 mg Oral Q6H PRN Derrill Kay A, MD      . amLODipine (NORVASC) tablet 5 mg  5 mg Oral Daily Derrill Kay A, MD   5 mg at 02/25/17 0943  . aspirin chewable tablet 81 mg  81 mg Oral Daily Derrill Kay A, MD   81 mg at 02/25/17 0943  . atorvastatin (LIPITOR)  tablet 10 mg  10 mg Oral Daily Derrill Kay A, MD   10 mg at 02/25/17 0944  . enoxaparin (LOVENOX) injection 40 mg  40 mg Subcutaneous Q24H Jonetta Osgood, MD   40 mg at 02/25/17 1552  . finasteride (PROSCAR) tablet 5 mg  5 mg Oral Daily Derrill Kay A, MD   5 mg at 02/25/17 0943  . fluticasone (FLONASE) 50 MCG/ACT nasal spray 2 spray  2 spray Each Nare Daily Jonetta Osgood, MD   2 spray at 02/25/17 0944  . guaiFENesin (MUCINEX) 12 hr tablet 600 mg  600 mg Oral  BID Jonetta Osgood, MD   600 mg at 02/25/17 2252  . lisinopril (PRINIVIL,ZESTRIL) tablet 40 mg  40 mg Oral Daily Derrill Kay A, MD   40 mg at 02/25/17 0943   And  . hydrochlorothiazide (HYDRODIURIL) tablet 25 mg  25 mg Oral Daily Phillips Grout, MD   25 mg at 02/25/17 0943  . ipratropium-albuterol (DUONEB) 0.5-2.5 (3) MG/3ML nebulizer solution 3 mL  3 mL Nebulization Q4H PRN Schorr, Rhetta Mura, NP   3 mL at 02/24/17 6629  . ipratropium-albuterol (DUONEB) 0.5-2.5 (3) MG/3ML nebulizer solution 3 mL  3 mL Nebulization TID Jonetta Osgood, MD   3 mL at 02/25/17 1935  . isosorbide mononitrate (IMDUR) 24 hr tablet 30 mg  30 mg Oral Daily Derrill Kay A, MD   30 mg at 02/25/17 0943  . loratadine (CLARITIN) tablet 10 mg  10 mg Oral Daily Jonetta Osgood, MD   10 mg at 02/25/17 1141  . methylPREDNISolone sodium succinate (SOLU-MEDROL) 40 mg/mL injection 40 mg  40 mg Intravenous Q8H Ghimire, Henreitta Leber, MD   40 mg at 02/26/17 4765  . metoprolol tartrate (LOPRESSOR) tablet 100 mg  100 mg Oral BID Derrill Kay A, MD   100 mg at 02/25/17 2248  . ondansetron (ZOFRAN) injection 4 mg  4 mg Intravenous Q6H PRN Jonetta Osgood, MD   4 mg at 02/24/17 1042  . oseltamivir (TAMIFLU) capsule 30 mg  30 mg Oral BID Phillips Grout, MD   30 mg at 02/25/17 2248  . oxymetazoline (AFRIN) 0.05 % nasal spray 1 spray  1 spray Each Nare BID Jonetta Osgood, MD   1 spray at 02/25/17 2252  . pantoprazole (PROTONIX) EC tablet 40 mg  40 mg Oral Daily Derrill Kay A, MD   40 mg at 02/25/17 0943  . sodium chloride flush (NS) 0.9 % injection 3 mL  3 mL Intravenous Q12H Derrill Kay A, MD   3 mL at 02/25/17 2249  . sodium chloride flush (NS) 0.9 % injection 3 mL  3 mL Intravenous PRN Derrill Kay A, MD      . tamsulosin (FLOMAX) capsule 0.4 mg  0.4 mg Oral QPC supper Derrill Kay A, MD   0.4 mg at 02/25/17 1820  . traZODone (DESYREL) tablet 50 mg  50 mg Oral QHS Phillips Grout, MD   50 mg at 02/25/17 2248      Discharge Medications: Please see discharge summary for a list of discharge medications.  Relevant Imaging Results:  Relevant Lab Results:   Additional Information SSN [id number]  Burnis Medin, LCSW

## 2017-02-26 NOTE — Progress Notes (Signed)
PROGRESS NOTE        PATIENT DETAILS Name: [name] [name] Age: 84 y.o. Sex: male Date of Birth: [birth year] Admit Date: 02/23/2017 Admitting Physician Patrecia Pour, MD NWG:[id number], [name], MD  Brief Narrative: Patient is a 84 y.o. male with history of CAD status post PCI in 2016, hypertension, dyslipidemia, neuropathy who presented to the hospital with several days history of cough, congestion, fever-he was subsequently diagnosed with influenza, possible UTI and admitted to the hospitalist service.  See below for further details  Subjective: Breathing is much better-lying comfortably-did not sleep well last night.  But looks much better compared to the past few days.  Assessment/Plan: Acute hypoxic respiratory failure secondary to acute bronchitis due to influenza: Much improved after starting steroids on 2/14.  Continue with Tamiflu, bronchodilators-and other supportive care-encourage incentive spirometry/flutter valve and mobilization.  Since improved, we will start tapering down steroids.  ?  UTI vs asymptomatic bacteriuria: Although the patient did acknowledge some foul-smelling urine-almost all of his symptoms/complaints are respiratory.  Urine culture is negative-Rocephin was stopped on 2/14-he denies any urinary symptoms to me this morning.    Acute kidney injury: Hemodynamically mediated, resolved with supportive care.  CAD status post PCI 2016:, Continue aspirin, statin and beta-blocker.   Hypertension: Controlled-continue Imdur, lisinopril, HCTZ, amlodipine and metoprolol.   BPH: Continue Flomax and finasteride  History of neuropathy: Daughter reports significant amount of neuropathy at baseline-have advised that we hold off on consulting neurology until he recovers from this acute illness.  Acute on chronic debility: Acute worsening due influenza-mostly wheelchair-bound per family.  Appreciate PT input-we will ask case management/social work to see if  we can transfer to SNF on discharge-likely over the next few days..  DVT Prophylaxis: Prophylactic Lovenox   Code Status: Full code   Family Communication: None at bedside  Disposition Plan: Remain inpatient-SNF in the next day or so-likely over the weekend  Antimicrobial agents: Anti-infectives (From admission, onward)   Start     Dose/Rate Route Frequency Ordered Stop   02/24/17 1300  cefTRIAXone (ROCEPHIN) 1 g in sodium chloride 0.9 % 100 mL IVPB  Status:  Discontinued     1 g 200 mL/hr over 30 Minutes Intravenous Every 24 hours 02/23/17 2040 02/25/17 1205   02/24/17 1300  azithromycin (ZITHROMAX) 500 mg in sodium chloride 0.9 % 250 mL IVPB  Status:  Discontinued     500 mg 250 mL/hr over 60 Minutes Intravenous Every 24 hours 02/23/17 2040 02/24/17 1353   02/24/17 0045  oseltamivir (TAMIFLU) capsule 30 mg     30 mg Oral 2 times daily 02/24/17 0031 02/28/17 2159   02/23/17 1308  azithromycin (ZITHROMAX) 500 MG injection    Comments:  Ilda Basset   : cabinet override      02/23/17 1308 02/23/17 1454   02/23/17 1245  cefTRIAXone (ROCEPHIN) 1 g in sodium chloride 0.9 % 100 mL IVPB     1 g 200 mL/hr over 30 Minutes Intravenous  Once 02/23/17 1244 02/23/17 1332   02/23/17 1245  azithromycin (ZITHROMAX) 500 mg in sodium chloride 0.9 % 250 mL IVPB     500 mg 250 mL/hr over 60 Minutes Intravenous  Once 02/23/17 1244 02/23/17 1430      Procedures: None  CONSULTS:  None  Time spent: 25 minutes-Greater than 50% of this time was spent in counseling, explanation  of diagnosis, planning of further management, and coordination of care.  MEDICATIONS: Scheduled Meds: . amLODipine  5 mg Oral Daily  . aspirin  81 mg Oral Daily  . atorvastatin  10 mg Oral Daily  . enoxaparin (LOVENOX) injection  40 mg Subcutaneous Q24H  . finasteride  5 mg Oral Daily  . fluticasone  2 spray Each Nare Daily  . guaiFENesin  600 mg Oral BID  . lisinopril  40 mg Oral Daily   And  .  hydrochlorothiazide  25 mg Oral Daily  . ipratropium-albuterol  3 mL Nebulization TID  . isosorbide mononitrate  30 mg Oral Daily  . loratadine  10 mg Oral Daily  . methylPREDNISolone (SOLU-MEDROL) injection  40 mg Intravenous Q8H  . metoprolol tartrate  100 mg Oral BID  . oseltamivir  30 mg Oral BID  . oxymetazoline  1 spray Each Nare BID  . pantoprazole  40 mg Oral Daily  . sodium chloride flush  3 mL Intravenous Q12H  . tamsulosin  0.4 mg Oral QPC supper  . traZODone  50 mg Oral QHS   Continuous Infusions: . sodium chloride     PRN Meds:.sodium chloride, acetaminophen, ipratropium-albuterol, ondansetron (ZOFRAN) IV, sodium chloride flush   PHYSICAL EXAM: Vital signs: Vitals:   02/25/17 2110 02/26/17 0452 02/26/17 0933 02/26/17 0937  BP: (!) 121/45 (!) 151/55    Pulse: 93 79    Resp: 16 16    Temp: 97.6 F (36.4 C) 98.2 F (36.8 C)    TempSrc: Oral Oral    SpO2: 94% 99% 92% 92%  Weight:      Height:       Filed Weights   02/23/17 1159 02/23/17 1844  Weight: 129.3 kg (285 lb) 92.5 kg (203 lb 14.8 oz)   Body mass index is 29.26 kg/m.   General appearance :Awake, alert, not in any distress.  Eyes:, pupils equally reactive to light and accomodation,no scleral icterus. HEENT: Atraumatic and Normocephalic Neck: supple, no JVD. Resp:Good air entry bilaterally, only a few scattered rhonchi (markedly better than yesterday) CVS: S1 S2 regular, no murmurs.  GI: Bowel sounds present, Non tender and not distended with no gaurding, rigidity or rebound. Extremities: B/L Lower Ext shows no edema, both legs are warm to touch Neurology:  speech clear,Non focal-generalized weakness. Psychiatric: Normal judgment and insight. Normal mood. Musculoskeletal:No digital cyanosis Skin:No Rash, warm and dry Wounds:N/A I have personally reviewed following labs and imaging studies  LABORATORY DATA: CBC: Recent Labs  Lab 02/23/17 1231 02/24/17 0509 02/25/17 0558  WBC 6.2 4.9 5.0    NEUTROABS 4.4 3.1  --   HGB 14.6 13.9 13.9  HCT 44.2 43.1 42.5  MCV 95.3 96.4 95.3  PLT 182 160 338    Basic Metabolic Panel: Recent Labs  Lab 02/23/17 1231 02/24/17 0509 02/25/17 0558 02/26/17 0749  NA 138 139 139 138  K 4.0 4.0 3.9 4.1  CL 99* 100* 100* 100*  CO2 29 29 28 27   GLUCOSE 100* 91 100* 154*  BUN 26* 25* 27* 25*  CREATININE 1.51* 1.37* 1.16 1.11  CALCIUM 9.1 8.8* 8.6* 8.9  MG  --   --   --  2.0    GFR: Estimated Creatinine Clearance: 51.6 mL/min (by C-G formula based on SCr of 1.11 mg/dL).  Liver Function Tests: Recent Labs  Lab 02/23/17 1231  AST 33  ALT 27  ALKPHOS 83  BILITOT 0.7  PROT 7.1  ALBUMIN 3.6   No results for input(s): LIPASE,  AMYLASE in the last 168 hours. No results for input(s): AMMONIA in the last 168 hours.  Coagulation Profile: No results for input(s): INR, PROTIME in the last 168 hours.  Cardiac Enzymes: No results for input(s): CKTOTAL, CKMB, CKMBINDEX, TROPONINI in the last 168 hours.  BNP (last 3 results) No results for input(s): PROBNP in the last 8760 hours.  HbA1C: No results for input(s): HGBA1C in the last 72 hours.  CBG: No results for input(s): GLUCAP in the last 168 hours.  Lipid Profile: No results for input(s): CHOL, HDL, LDLCALC, TRIG, CHOLHDL, LDLDIRECT in the last 72 hours.  Thyroid Function Tests: No results for input(s): TSH, T4TOTAL, FREET4, T3FREE, THYROIDAB in the last 72 hours.  Anemia Panel: No results for input(s): VITAMINB12, FOLATE, FERRITIN, TIBC, IRON, RETICCTPCT in the last 72 hours.  Urine analysis:    Component Value Date/Time   COLORURINE YELLOW 02/23/2017 1350   APPEARANCEUR CLOUDY (A) 02/23/2017 1350   LABSPEC 1.020 02/23/2017 1350   PHURINE 6.0 02/23/2017 1350   GLUCOSEU NEGATIVE 02/23/2017 1350   HGBUR NEGATIVE 02/23/2017 1350   BILIRUBINUR NEGATIVE 02/23/2017 1350   KETONESUR NEGATIVE 02/23/2017 1350   PROTEINUR NEGATIVE 02/23/2017 1350   UROBILINOGEN 0.2 11/09/2014  1532   NITRITE NEGATIVE 02/23/2017 1350   LEUKOCYTESUR TRACE (A) 02/23/2017 1350    Sepsis Labs: Lactic Acid, Venous    Component Value Date/Time   LATICACIDVEN 0.88 02/23/2017 1253    MICROBIOLOGY: Recent Results (from the past 240 hour(s))  Culture, blood (routine x 2)     Status: None (Preliminary result)   Collection Time: 02/23/17 12:30 PM  Result Value Ref Range Status   Specimen Description   Final    BLOOD RIGHT ANTECUBITAL Performed at [facility], [address] [place], [place] [address]    Special Requests   Final    BOTTLES DRAWN AEROBIC AND ANAEROBIC Blood Culture adequate volume Performed at [facility], [address] [place], [place] [address]    Culture   Final    NO GROWTH 2 DAYS Performed at [facility], [place] [address], [place], [place] [address]    Report Status PENDING  Incomplete  Culture, blood (routine x 2)     Status: None (Preliminary result)   Collection Time: 02/23/17 12:55 PM  Result Value Ref Range Status   Specimen Description   Final    BLOOD BLOOD RIGHT FOREARM Performed at [facility], [address] [place], [place] [address]    Special Requests   Final    BOTTLES DRAWN AEROBIC AND ANAEROBIC Blood Culture adequate volume Performed at [facility], [address] [place], [place] [address]    Culture   Final    NO GROWTH 2 DAYS Performed at [facility], [place] [address] [place], [place] [address]    Report Status PENDING  Incomplete  Urine culture     Status: Abnormal   Collection Time: 02/23/17  1:50 PM  Result Value Ref Range Status   Specimen Description   Final    URINE, RANDOM Performed at [facility], [address] [place], [place] [address]    Special Requests   Final    NONE Performed at [facility], [address] [place], [place] [address]    Culture (A)  Final    <10,000 COLONIES/mL INSIGNIFICANT GROWTH Performed at [facility], [place]  [address], [place], [place] [address]    Report Status 02/24/2017 FINAL  Final    RADIOLOGY STUDIES/RESULTS: Dg Chest 2 View  Result Date: 02/23/2017 CLINICAL DATA:  Cough and fever EXAM: CHEST  2 VIEW COMPARISON:  February 22, 2017 FINDINGS: There is atelectatic change in the right base. Lungs elsewhere are clear. The heart is borderline enlarged with pulmonary venous hypertension. There is aortic atherosclerosis. No adenopathy. No bone lesions. IMPRESSION: Atelectasis right base. No edema or consolidation. Underlying pulmonary vascular congestion. There is aortic atherosclerosis. Aortic Atherosclerosis (ICD10-I70.0). Electronically Signed   By: Lowella Grip III M.D.   On: 02/23/2017 12:26     LOS: 3 days   Oren Binet, MD  [facility] Pager:[phone]  If 7PM-7AM, please contact night-coverage [email or web address] Password [facility] 02/26/2017, 10:37 AM

## 2017-02-26 NOTE — Plan of Care (Signed)
  Safety: Ability to remain free from injury will improve 02/26/2017 0024 - Progressing by Asherah Lavoy, Sherryll Burger, RN

## 2017-02-26 NOTE — Progress Notes (Signed)
PT demonstrated hands on understanding of Flutter device. PC at this time. 

## 2017-02-26 NOTE — Progress Notes (Signed)
Physical Therapy Treatment Patient Details Name: [name] [name] MRN: [id number] DOB: 01-05-1926 Today's Date: 02/26/2017    History of Present Illness 84 y.o. male with medical history significant of coronary artery disease, hypertension, hyperlipidemia. Pt found to have pneumonia, + influenza A.     PT Comments    NT was checking vitals on arrival. Pt able to squat pivot to w/c for assessment of safety and SpO2 during w/c mobility; remained on 3L O2 [place]. Pt was able to hold LE off ground during w/c propulsion (leg rests at home). Continue to recommend ST rehab at [facility] for increased activity tolerance and strengthening.    Follow Up Recommendations  SNF     Equipment Recommendations  None recommended by PT    Recommendations for Other Services       Precautions / Restrictions Precautions Precautions: Fall Precaution Comments: monitor sats Restrictions Weight Bearing Restrictions: No    Mobility  Bed Mobility Overal bed mobility: Needs Assistance Bed Mobility: Supine to Sit     Supine to sit: Min assist     General bed mobility comments: HHA for trunk rise, also used UE to push up  Transfers Overall transfer level: Needs assistance Equipment used: None Transfers: Squat Pivot Transfers     Squat pivot transfers: Min assist     General transfer comment: Assist with steadying, controlling trunk direction to w/c, utilized more UE assist holding w/c armrests, able to clear hips/buttocks over armrests  Ambulation/Gait                 Hotel manager mobility: Yes Wheelchair propulsion: Both upper extremities Wheelchair parts: Supervision/cueing Distance: 140 Wheelchair Assistance Details (indicate cue type and reason): supervision for safety, pushed back to room due to fatigue; remained on 3 L O2 [place], pt able to hold LE off floor (states he left his leg rests at home), remained on 3L O2 [place] and SPO2 98%  upon returning to room  Modified Rankin (Stroke Patients Only)       Balance                                            Cognition Arousal/Alertness: Awake/alert Behavior During Therapy: WFL for tasks assessed/performed Overall Cognitive Status: Within Functional Limits for tasks assessed                                        Exercises      General Comments        Pertinent Vitals/Pain Pain Assessment: No/denies pain    Home Living                      Prior Function            PT Goals (current goals can now be found in the care plan section) Progress towards PT goals: Progressing toward goals    Frequency    Min 2X/week      PT Plan Current plan remains appropriate    Co-evaluation              AM-PAC PT "6 Clicks" Daily Activity  Outcome Measure  Difficulty turning over in bed (including adjusting bedclothes, sheets and blankets)?:  A Lot Difficulty moving from lying on back to sitting on the side of the bed? : A Lot Difficulty sitting down on and standing up from a chair with arms (e.g., wheelchair, bedside commode, etc,.)?: Unable Help needed moving to and from a bed to chair (including a wheelchair)?: Total Help needed walking in hospital room?: Total Help needed climbing 3-5 steps with a railing? : Total 6 Click Score: 8    End of Session Equipment Utilized During Treatment: Oxygen Activity Tolerance: Patient tolerated treatment well Patient left: with call bell/phone within reach;Other (comment)(in w/c, RN notified) Nurse Communication: Mobility status PT Visit Diagnosis: Other abnormalities of gait and mobility (R26.89)     Time: 1350-1407 PT Time Calculation (min) (ACUTE ONLY): 17 min  Charges:  $Therapeutic Activity: 8-22 mins                    G Codes:      [name] [name], SPT  [name] [name] 02/26/2017, 3:06 PM

## 2017-02-26 NOTE — Care Management Important Message (Addendum)
Important Message  Patient Details IM Letter given to Kathy/Case Manager to present to the Patient Name: Raeshawn Vo MRN: [id number] Date of Birth: [birth year]   Medicare Important Message Given:  Yes    Kerin Salen 02/26/2017, 11:07 AMImportant Message  Patient Details  Name: Zayed Griffie MRN: [id number] Date of Birth: 1925/09/03   Medicare Important Message Given:  Yes    Kerin Salen 02/26/2017, 11:07 AM

## 2017-02-27 DIAGNOSIS — J1108 Influenza due to unidentified influenza virus with specified pneumonia: Secondary | ICD-10-CM | POA: Diagnosis not present

## 2017-02-27 DIAGNOSIS — R410 Disorientation, unspecified: Secondary | ICD-10-CM | POA: Diagnosis not present

## 2017-02-27 DIAGNOSIS — G603 Idiopathic progressive neuropathy: Secondary | ICD-10-CM | POA: Diagnosis not present

## 2017-02-27 DIAGNOSIS — M810 Age-related osteoporosis without current pathological fracture: Secondary | ICD-10-CM | POA: Diagnosis not present

## 2017-02-27 DIAGNOSIS — F488 Other specified nonpsychotic mental disorders: Secondary | ICD-10-CM | POA: Diagnosis not present

## 2017-02-27 DIAGNOSIS — Z7189 Other specified counseling: Secondary | ICD-10-CM | POA: Diagnosis not present

## 2017-02-27 DIAGNOSIS — J9601 Acute respiratory failure with hypoxia: Secondary | ICD-10-CM | POA: Diagnosis not present

## 2017-02-27 DIAGNOSIS — R0602 Shortness of breath: Secondary | ICD-10-CM | POA: Diagnosis not present

## 2017-02-27 DIAGNOSIS — E0590 Thyrotoxicosis, unspecified without thyrotoxic crisis or storm: Secondary | ICD-10-CM | POA: Diagnosis not present

## 2017-02-27 DIAGNOSIS — I10 Essential (primary) hypertension: Secondary | ICD-10-CM | POA: Diagnosis not present

## 2017-02-27 DIAGNOSIS — R1312 Dysphagia, oropharyngeal phase: Secondary | ICD-10-CM | POA: Diagnosis not present

## 2017-02-27 DIAGNOSIS — N401 Enlarged prostate with lower urinary tract symptoms: Secondary | ICD-10-CM | POA: Diagnosis not present

## 2017-02-27 DIAGNOSIS — R5381 Other malaise: Secondary | ICD-10-CM | POA: Diagnosis not present

## 2017-02-27 DIAGNOSIS — W19XXXA Unspecified fall, initial encounter: Secondary | ICD-10-CM | POA: Diagnosis not present

## 2017-02-27 DIAGNOSIS — N390 Urinary tract infection, site not specified: Secondary | ICD-10-CM | POA: Diagnosis not present

## 2017-02-27 DIAGNOSIS — F4312 Post-traumatic stress disorder, chronic: Secondary | ICD-10-CM | POA: Diagnosis not present

## 2017-02-27 DIAGNOSIS — F4310 Post-traumatic stress disorder, unspecified: Secondary | ICD-10-CM | POA: Diagnosis not present

## 2017-02-27 DIAGNOSIS — F339 Major depressive disorder, recurrent, unspecified: Secondary | ICD-10-CM | POA: Diagnosis not present

## 2017-02-27 DIAGNOSIS — R262 Difficulty in walking, not elsewhere classified: Secondary | ICD-10-CM | POA: Diagnosis not present

## 2017-02-27 DIAGNOSIS — K219 Gastro-esophageal reflux disease without esophagitis: Secondary | ICD-10-CM | POA: Diagnosis not present

## 2017-02-27 DIAGNOSIS — M6281 Muscle weakness (generalized): Secondary | ICD-10-CM | POA: Diagnosis not present

## 2017-02-27 DIAGNOSIS — E7849 Other hyperlipidemia: Secondary | ICD-10-CM | POA: Diagnosis not present

## 2017-02-27 DIAGNOSIS — J9600 Acute respiratory failure, unspecified whether with hypoxia or hypercapnia: Secondary | ICD-10-CM | POA: Diagnosis not present

## 2017-02-27 DIAGNOSIS — R278 Other lack of coordination: Secondary | ICD-10-CM | POA: Diagnosis not present

## 2017-02-27 DIAGNOSIS — I2510 Atherosclerotic heart disease of native coronary artery without angina pectoris: Secondary | ICD-10-CM | POA: Diagnosis not present

## 2017-02-27 DIAGNOSIS — N400 Enlarged prostate without lower urinary tract symptoms: Secondary | ICD-10-CM | POA: Diagnosis not present

## 2017-02-27 DIAGNOSIS — J181 Lobar pneumonia, unspecified organism: Secondary | ICD-10-CM | POA: Diagnosis not present

## 2017-02-27 MED ORDER — HYDRALAZINE HCL 20 MG/ML IJ SOLN: 10.0000 mg | Freq: Once | INTRAMUSCULAR | Status: DC | PRN

## 2017-02-27 MED ORDER — ISOSORBIDE MONONITRATE ER 60 MG PO TB24: 60.0000 mg | ORAL_TABLET | Freq: Every day | ORAL | 3 refills | Status: DC

## 2017-02-27 MED ORDER — ALBUTEROL SULFATE (2.5 MG/3ML) 0.083% IN NEBU: 2.5000 mg | INHALATION_SOLUTION | Freq: Four times a day (QID) | RESPIRATORY_TRACT | 12 refills | Status: DC | PRN

## 2017-02-27 MED ORDER — PREDNISONE 20 MG PO TABS: 20.0000 mg | ORAL_TABLET | Freq: Every day | ORAL | 0 refills | Status: DC

## 2017-02-27 MED ORDER — FLUTICASONE PROPIONATE 50 MCG/ACT NA SUSP: 2.0000 | Freq: Every day | NASAL | 2 refills | Status: DC

## 2017-02-27 MED ORDER — LORATADINE 10 MG PO TABS: 10.0000 mg | ORAL_TABLET | Freq: Every day | ORAL | 3 refills | Status: AC

## 2017-02-27 MED ORDER — LISINOPRIL 40 MG PO TABS: 40.0000 mg | ORAL_TABLET | Freq: Every day | ORAL | 3 refills | Status: DC

## 2017-02-27 MED ORDER — GUAIFENESIN ER 600 MG PO TB12: 600.0000 mg | ORAL_TABLET | Freq: Two times a day (BID) | ORAL | 3 refills | Status: DC

## 2017-02-27 MED ORDER — IPRATROPIUM-ALBUTEROL 0.5-2.5 (3) MG/3ML IN SOLN: 3.0000 mL | Freq: Three times a day (TID) | RESPIRATORY_TRACT | 2 refills | Status: DC

## 2017-02-27 MED ORDER — HYDRALAZINE HCL 10 MG PO TABS: 10.0000 mg | ORAL_TABLET | Freq: Three times a day (TID) | ORAL | 2 refills | Status: DC

## 2017-02-27 MED ORDER — OSELTAMIVIR PHOSPHATE 30 MG PO CAPS: 30.0000 mg | ORAL_CAPSULE | Freq: Two times a day (BID) | ORAL | 0 refills | Status: AC

## 2017-02-27 MED ORDER — GUAIFENESIN ER 600 MG PO TB12: 600.0000 mg | ORAL_TABLET | Freq: Two times a day (BID) | ORAL | 0 refills | Status: AC

## 2017-02-27 MED ORDER — ATORVASTATIN CALCIUM 10 MG PO TABS: 10.0000 mg | ORAL_TABLET | Freq: Every day | ORAL | 3 refills | Status: DC

## 2017-02-27 MED ORDER — AMLODIPINE BESYLATE 10 MG PO TABS
10.0000 mg | ORAL_TABLET | Freq: Every day | ORAL | 3 refills | Status: DC
Start: 2017-02-27 — End: 2017-04-08

## 2017-02-27 MED ORDER — TIOTROPIUM BROMIDE MONOHYDRATE 18 MCG IN CAPS: 18.0000 ug | ORAL_CAPSULE | Freq: Every day | RESPIRATORY_TRACT | 2 refills | Status: AC

## 2017-02-27 NOTE — Clinical Social Work Placement (Signed)
   CLINICAL SOCIAL WORK PLACEMENT  NOTE  Date:  02/27/2017  Patient Details  Name: [name] [name] MRN: [id number] Date of Birth: 11-25-1925  Clinical Social Work is seeking post-discharge placement for this patient at the [place] level of care (*CSW will initial, date and re-position this form in  chart as items are completed):  Yes   Patient/family provided with [facility] Work Department's list of facilities offering this level of care within the geographic area requested by the patient (or if unable, by the patient's family).  Yes   Patient/family informed of their freedom to choose among providers that offer the needed level of care, that participate in Medicare, Medicaid or managed care program needed by the patient, have an available bed and are willing to accept the patient.  Yes   Patient/family informed of [facility]'s ownership interest in [facility] and [facility], as well as of the fact that they are under no obligation to receive care at these facilities.  PASRR submitted to EDS on       PASRR number received on       Existing PASRR number confirmed on 02/26/17     FL2 transmitted to all facilities in geographic area requested by pt/family on 02/26/17     FL2 transmitted to all facilities within larger geographic area on       Patient informed that his/her managed care company has contracts with or will negotiate with certain facilities, including the following:        Yes   Patient/family informed of bed offers received.  Patient chooses bed at [facility]     Physician recommends and patient chooses bed at [facility]    Patient to be transferred to [facility] on 02/27/17.  Patient to be transferred to facility by EMS     Patient family notified on 02/27/17 of transfer.  Name of family member notified:  [name]     PHYSICIAN Please sign FL2     Additional Comment:     _______________________________________________ Lilly C[place], LCSW 02/27/2017, 1:56 PM

## 2017-02-27 NOTE — Discharge Summary (Signed)
[name] [name], is a 84 y.o. male  DOB Nov 17, 1925  MRN [id number].  Admission date:  02/23/2017  Admitting Physician  Patrecia Pour, MD  Discharge Date:  02/27/2017   Primary MD  Thressa Sheller, MD  Recommendations for primary care physician for things to follow:   follow-up with Dr. [name] (Cardiologist) in March after discharge from [facility] for Reevaluation and possible echocardiogram  Admission Diagnosis  Acute respiratory failure with hypoxia ([place]) [J96.01] Community acquired pneumonia of right lower lobe of lung ([place]) [J18.1]  Discharge Diagnosis  Acute respiratory failure with hypoxia ([place]) [J96.01] Community acquired pneumonia of right lower lobe of lung ([place]) [J18.1]   Principal Problem:   Acute respiratory failure with hypoxia ([place]) Active Problems:   Hypertension   Dysphagia, pharyngoesophageal phase   Weakness   CAD (coronary artery disease)   UTI (urinary tract infection)   PTSD (post-traumatic stress disorder)   CAP (community acquired pneumonia)   Nausea & vomiting      Past Medical History:  Diagnosis Date  . Anginal pain ([place])   . Arthritis    "all over"  . Coronary artery disease   . Depression ~ 73; ~ 1990  . GERD (gastroesophageal reflux disease)   . High cholesterol   . Hypertension   . Hyperthyroidism   . Neuropathy   . Osteoporosis   . Skin cancer of nose     Past Surgical History:  Procedure Laterality Date  . BACK SURGERY    . CARDIAC CATHETERIZATION  09/2007  . CARDIAC CATHETERIZATION N/A 05/08/2014   Procedure: CORONARY STENT INTERVENTION;  Surgeon: [name], MD; 3.518 mm Xience Alpine DES to the RCA   . LEFT HEART CATHETERIZATION WITH CORONARY ANGIOGRAM N/A 05/08/2014   Procedure: LEFT HEART CATHETERIZATION WITH CORONARY ANGIOGRAM;  Surgeon: [name], MD; LAD 100% (old), CFX 20%, OM1 30%, pRCA 30%, dRCA 95%, EF 55%     . LUMBAR  LAMINECTOMY/DECOMPRESSION MICRODISCECTOMY  2011      HPI  from the history and physical done on the day of admission:   Chief Complaint: Cough and shortness of breath  HPI: [name] [name] is a 84 y.o. male with medical history significant of coronary artery disease, hypertension, hyperlipidemia comes in with 2 days of worsening cough and shortness of breath.  His daughter is worried about him because of his coughing and shortness of breath so brought him to [facility].  He is found to probably have pneumonia and hypoxia with O2 sats at 86% on room air.  He denies any urinary symptoms.  He has not had any sick contacts.  His influenza is pending.  Patient is referred for admission for community-acquired pneumonia and acute hypoxic respiratory failure    Hospital Course:     Brief Narrative: Patient is a 84 y.o. male with history of CAD status post PCI in 2016, hypertension, dyslipidemia, neuropathy (uses wheelchair at baseline)  who presented to the hospital with several days history of cough, congestion, fever-he was subsequently  diagnosed with influenza A, Please See below for further details  Subjective: Patient's daughter Jana Half at bedside, no fevers, respiratory status has improved significantly, cough and shortness of breath is much better,, he was out in the chair earlier, oral intake is fair.  No vomiting or diarrhea  Assessment/Plan: Acute hypoxic respiratory failure secondary to acute bronchitis due to influenza A: overall much improved after treatment with steroids and Tamiflu, as well as bronchodilators supplemental oxygen . encourage incentive spirometry/flutter valve and mobilization.    Okay to complete Tamiflu last dose 03/01/2017 okay to taper off steroids, okay to continue supplemental oxygen try to wean oxygen off over the next couple days  ?  UTI vs asymptomatic bacteriuria: Although the patient did acknowledge some foul-smelling urine-almost all of his  symptoms/complaints are respiratory.  Urine culture is negative-Rocephin was stopped on 2/14- he denies any urinary symptoms  Acute kidney injury: Hemodynamically mediated, resolved with supportive care.  CAD status post PCI 2016:-  Continue aspirin, Lipitor and metoprolol, no chest pains or ACS type symptoms  Hypertension:   continue Imdur 60 mg daily, lisinopril 40 mg daily, STOP HCTZ to avoid dehydration given influenza, continue amlodipine 10 mg daily and metoprolol 100 mg daily.   BPH: Continue Flomax and finasteride  History of neuropathy: Daughter reports significant amount of neuropathy at baseline-  (uses wheelchair at baseline) , have advised that we hold off on consulting neurology until he recovers from this acute illness.  Acute on chronic debility: Acute worsening due influenza-mostly wheelchair-bound per family.  Appreciate PT input-discharge to SNF at [place] [facility]  Discharge Condition: stable  Follow UP- follow-up with Dr. [name] (Cardiologist) in March after discharge from SNF Rehab for Reevaluation and possible echocardiogram   Diet and Activity recommendation:  As advised  Discharge Instructions    Discharge Instructions    (HEART FAILURE PATIENTS) Call MD:  Anytime you have any of the following symptoms: 1) 3 pound weight gain in 24 hours or 5 pounds in 1 week 2) shortness of breath, with or without a dry hacking cough 3) swelling in the hands, feet or stomach 4) if you have to sleep on extra pillows at night in order to breathe.   Complete by:  As directed    Call MD for:  difficulty breathing, headache or visual disturbances   Complete by:  As directed    Call MD for:  persistant dizziness or light-headedness   Complete by:  As directed    Call MD for:  persistant nausea and vomiting   Complete by:  As directed    Call MD for:  severe uncontrolled pain   Complete by:  As directed    Call MD for:  temperature >100.4   Complete by:  As directed     Diet - low sodium heart healthy   Complete by:  As directed    Discharge instructions   Complete by:  As directed    1)Range of motion motion and mobility exercises 3 times a day 2) please give breathing treatments as ordered 3) follow-up with Dr. [name] (Cardiologist) in March after discharge from SNF Rehab for Reevaluation and possible echocardiogram   Increase activity slowly   Complete by:  As directed    Other Restrictions   Complete by:  As directed    Fall Precautions- Needs wheelchair       Discharge Medications     Allergies as of 02/27/2017      Reactions   Cortisone Other (See  Comments)   Causes emotional problems       Medication List    STOP taking these medications   lisinopril-hydrochlorothiazide 20-12.5 MG tablet Commonly known as:  PRINZIDE,ZESTORETIC     TAKE these medications   acetaminophen 500 MG tablet Commonly known as:  TYLENOL Take 500-1,000 mg by mouth every 6 (six) hours as needed for mild pain (pain).   albuterol (2.5 MG/3ML) 0.083% nebulizer solution Commonly known as:  PROVENTIL Take 3 mLs (2.5 mg total) by nebulization every 6 (six) hours as needed for wheezing or shortness of breath.   amLODipine 10 MG tablet Commonly known as:  NORVASC Take 1 tablet (10 mg total) by mouth daily. Take one tab by mouth daily What changed:    how much to take  additional instructions   aspirin 81 MG tablet Take 1 tablet (81 mg total) by mouth daily.   atorvastatin 10 MG tablet Commonly known as:  LIPITOR Take 1 tablet (10 mg total) by mouth daily. Start taking on:  02/28/2017 What changed:    medication strength  additional instructions   B-12 COMPLIANCE INJECTION 1000 MCG/ML Kit Generic drug:  Cyanocobalamin Inject 1,000 mcg as directed every 30 (thirty) days. Patient takes the first Sunday each month   calcium-vitamin D 500-200 MG-UNIT tablet Commonly known as:  OSCAL WITH D Take 1 tablet by mouth 2 (two) times daily.     Carboxymethylcellulose Sod PF 1 % Gel Place 1 drop into both eyes at bedtime.   finasteride 5 MG tablet Commonly known as:  PROSCAR Take 5 mg by mouth daily.   Fish Oil 1200 MG Caps Take 1 capsule by mouth daily.   fluticasone 50 MCG/ACT nasal spray Commonly known as:  FLONASE Place 2 sprays into both nostrils daily. Start taking on:  02/28/2017   guaiFENesin 600 MG 12 hr tablet Commonly known as:  MUCINEX Take 1 tablet (600 mg total) by mouth 2 (two) times daily.   guaiFENesin 600 MG 12 hr tablet Commonly known as:  MUCINEX Take 1 tablet (600 mg total) by mouth 2 (two) times daily for 10 days.   hydrALAZINE 10 MG tablet Commonly known as:  APRESOLINE Take 1 tablet (10 mg total) by mouth 3 (three) times daily.   ipratropium-albuterol 0.5-2.5 (3) MG/3ML Soln Commonly known as:  DUONEB Take 3 mLs by nebulization 3 (three) times daily.   isosorbide mononitrate 60 MG 24 hr tablet Commonly known as:  IMDUR Take 1 tablet (60 mg total) by mouth daily. What changed:    how much to take  when to take this  Another medication with the same name was removed. Continue taking this medication, and follow the directions you see here.   lisinopril 40 MG tablet Commonly known as:  PRINIVIL,ZESTRIL Take 1 tablet (40 mg total) by mouth daily. Start taking on:  02/28/2017   loratadine 10 MG tablet Commonly known as:  CLARITIN Take 1 tablet (10 mg total) by mouth daily. Start taking on:  02/28/2017   Melatonin 3 MG Tabs Take 3 mg by mouth every evening.   metoprolol tartrate 100 MG tablet Commonly known as:  LOPRESSOR Take 100 mg by mouth 2 (two) times daily.   oseltamivir 30 MG capsule Commonly known as:  TAMIFLU Take 1 capsule (30 mg total) by mouth 2 (two) times daily for 2 days.   pantoprazole 40 MG tablet Commonly known as:  PROTONIX Take 1 tablet (40 mg total) by mouth daily.   predniSONE 20 MG tablet Commonly  known as:  DELTASONE Take 1 tablet (20 mg total) by  mouth daily with breakfast.   PRESERVISION AREDS 2+MULTI VIT Caps Take 2 capsules by mouth daily.   tamsulosin 0.4 MG Caps capsule Commonly known as:  FLOMAX Take 1 capsule (0.4 mg total) by mouth daily after supper.   tiotropium 18 MCG inhalation capsule Commonly known as:  SPIRIVA HANDIHALER Place 1 capsule (18 mcg total) into inhaler and inhale daily.   traZODone 50 MG tablet Commonly known as:  DESYREL Take 50 mg by mouth at bedtime.       Major procedures and Radiology Reports - PLEASE review detailed and final reports for all details, in brief -   Dg Chest 2 View  Result Date: 02/23/2017 CLINICAL DATA:  Cough and fever EXAM: CHEST  2 VIEW COMPARISON:  February 22, 2017 FINDINGS: There is atelectatic change in the right base. Lungs elsewhere are clear. The heart is borderline enlarged with pulmonary venous hypertension. There is aortic atherosclerosis. No adenopathy. No bone lesions. IMPRESSION: Atelectasis right base. No edema or consolidation. Underlying pulmonary vascular congestion. There is aortic atherosclerosis. Aortic Atherosclerosis (ICD10-I70.0). Electronically Signed   By: Lowella Grip III M.D.   On: 02/23/2017 12:26    Micro Results   Recent Results (from the past 240 hour(s))  Culture, blood (routine x 2)     Status: None (Preliminary result)   Collection Time: 02/23/17 12:30 PM  Result Value Ref Range Status   Specimen Description   Final    BLOOD RIGHT ANTECUBITAL Performed at [facility], [address] [place], [place] [address]    Special Requests   Final    BOTTLES DRAWN AEROBIC AND ANAEROBIC Blood Culture adequate volume Performed at [facility], [address] [place], [place] [address]    Culture   Final    NO GROWTH 4 DAYS Performed at [facility], [place] [address] [place], [place] [address]    Report Status PENDING  Incomplete  Culture, blood (routine x 2)     Status: None (Preliminary result)    Collection Time: 02/23/17 12:55 PM  Result Value Ref Range Status   Specimen Description   Final    BLOOD BLOOD RIGHT FOREARM Performed at [facility], [address] [place], [place] [address]    Special Requests   Final    BOTTLES DRAWN AEROBIC AND ANAEROBIC Blood Culture adequate volume Performed at [facility], [address] [place], [place] [address]    Culture   Final    NO GROWTH 4 DAYS Performed at [facility], [place] [address], [place], [place] [address]    Report Status PENDING  Incomplete  Urine culture     Status: Abnormal   Collection Time: 02/23/17  1:50 PM  Result Value Ref Range Status   Specimen Description   Final    URINE, RANDOM Performed at [facility], [address] [place], [place] [address]    Special Requests   Final    NONE Performed at [facility], [address] [place], [place] [address]    Culture (A)  Final    <10,000 COLONIES/mL INSIGNIFICANT GROWTH Performed at [facility], [place] [address] [place], [place] [address]    Report Status 02/24/2017 FINAL  Final       Today   Subjective    [name] [name] today has no  new concerns, no f/c, no n/v/d , Patient's daughter Jana Half at bedside, no fevers, respiratory status has improved significantly, cough and shortness of breath is much better,, he was out in the chair earlier, oral intake is fair.  No vomiting or diarrhea       Patient has been seen and examined prior to discharge   Objective   Blood pressure 138/72, pulse 72, temperature 98.1 F (36.7 C), temperature source Oral, resp. rate 18, height 5' 10" (1.778 m), weight 92.5 kg (203 lb 14.8 oz), SpO2 97 %.   Intake/Output Summary (Last 24 hours) at 02/27/2017 1437 Last data filed at 02/27/2017 0840 Gross per 24 hour  Intake 723 ml  Output 625 ml  Net 98 ml   Exam Gen:- Awake  In no apparent distress  HEENT:- [place].AT,   Nose- [place] 2 L/min  Neck-Supple Neck,No JVD,    Lungs-fairly symmetrical air movement, occasional scattered wheezes, CV- S1, S2 normal Abd-  +ve B.Sounds, Abd Soft, No tenderness,    Extremity/Skin:- Intact peripheral pulses   Neuro-Generalized weakness w/o New Focal deficits Psych-appropriate affect   Data Review   CBC w Diff:  Lab Results  Component Value Date   WBC 5.0 02/25/2017   HGB 13.9 02/25/2017   HCT 42.5 02/25/2017   PLT 160 02/25/2017   LYMPHOPCT 20 02/24/2017   MONOPCT 15 02/24/2017   EOSPCT 2 02/24/2017   BASOPCT 0 02/24/2017   CMP:  Lab Results  Component Value Date   NA 138 02/26/2017   K 4.1 02/26/2017   CL 100 (L) 02/26/2017   CO2 27 02/26/2017   BUN 25 (H) 02/26/2017   CREATININE 1.11 02/26/2017   CREATININE 1.01 05/04/2014   PROT 7.1 02/23/2017   ALBUMIN 3.6 02/23/2017   BILITOT 0.7 02/23/2017   ALKPHOS 83 02/23/2017   AST 33 02/23/2017   ALT 27 02/23/2017   Total Discharge time is about 33 minutes  Roxan Hockey M.D on 02/27/2017 at 2:37 PM  [facility]   Office  [phone]  Voice Recognition Viviann Spare dictation system was used to create this note, attempts have been made to correct errors. Please contact the author with questions and/or clarifications.

## 2017-02-27 NOTE — Progress Notes (Addendum)
Consult: SNF placement  Daughter [name] called to find another option. Daughter is hopeful for placement at [facility] as the first choice.  Other choices: [facility] and [facility] would like call to [place] on Monday (if still here to see they can offer)  12:13 PM Spoke with daughter who wants MD to call her regarding possible DC Discussed with daughter beds available if patient is discharged today: [name] and maybe [facility] (will call back at 1pm for final decision as it pends on bed status.) MD has been paged and will discuss with him regarding daughters wishes to be called.  She is concerned with patient discharging today.  Awaiting call back from daughter to confirm which bed she would be interested in and final decision regarding discharge.   1:51 PM LCSW has bed for patient at [facility] for today. Family voices patient is anxious however they are agreeable to move patient today for discharge. MD called updated on patient plan.  DC to [facility] this afternoon once orders are completed. Family will meet patient at the facility. LCSW spoke with daughter [name] who would like to transfer patient by EMS. Patient is going to room: 116 Report: [phone]  No other needs, all dc paperwork sent to facility.  Lane Hacker, MSW Clinical Social Work: [employer] Coverage for :  [phone]

## 2017-02-28 LAB — CULTURE, BLOOD (ROUTINE X 2)
Culture: NO GROWTH
Culture: NO GROWTH
SPECIAL REQUESTS: ADEQUATE
SPECIAL REQUESTS: ADEQUATE

## 2017-03-01 DIAGNOSIS — R5381 Other malaise: Secondary | ICD-10-CM | POA: Diagnosis not present

## 2017-03-01 DIAGNOSIS — R410 Disorientation, unspecified: Secondary | ICD-10-CM | POA: Diagnosis not present

## 2017-03-01 DIAGNOSIS — W19XXXA Unspecified fall, initial encounter: Secondary | ICD-10-CM | POA: Diagnosis not present

## 2017-03-01 DIAGNOSIS — R278 Other lack of coordination: Secondary | ICD-10-CM | POA: Diagnosis not present

## 2017-03-01 DIAGNOSIS — R262 Difficulty in walking, not elsewhere classified: Secondary | ICD-10-CM | POA: Diagnosis not present

## 2017-03-01 DIAGNOSIS — M6281 Muscle weakness (generalized): Secondary | ICD-10-CM | POA: Diagnosis not present

## 2017-03-01 DIAGNOSIS — F4310 Post-traumatic stress disorder, unspecified: Secondary | ICD-10-CM | POA: Diagnosis not present

## 2017-03-02 DIAGNOSIS — N401 Enlarged prostate with lower urinary tract symptoms: Secondary | ICD-10-CM | POA: Diagnosis not present

## 2017-03-02 DIAGNOSIS — F488 Other specified nonpsychotic mental disorders: Secondary | ICD-10-CM | POA: Diagnosis not present

## 2017-03-02 DIAGNOSIS — J9601 Acute respiratory failure with hypoxia: Secondary | ICD-10-CM | POA: Diagnosis not present

## 2017-03-02 DIAGNOSIS — I2510 Atherosclerotic heart disease of native coronary artery without angina pectoris: Secondary | ICD-10-CM | POA: Diagnosis not present

## 2017-03-08 DIAGNOSIS — N390 Urinary tract infection, site not specified: Secondary | ICD-10-CM | POA: Diagnosis not present

## 2017-03-08 DIAGNOSIS — R410 Disorientation, unspecified: Secondary | ICD-10-CM | POA: Diagnosis not present

## 2017-03-23 ENCOUNTER — Other Ambulatory Visit: Payer: Self-pay | Admitting: *Deleted

## 2017-03-23 NOTE — Patient Outreach (Signed)
[facility]) Care Management  03/23/2017  Darrien Belter [birth year] [id number]   Met with patient at facility. Patient is to discharge from facility 03/26/17. Patient has been at [facility] for therapy since 02/27/17.  Patient lives alone. He has supportive family but is his own RP.  He does go to adult center 3 days a week (T,W,F) church friends provide transportation.  Patient agrees to [facility] care management transition of care calls and/or visits.   Plan to refer to [facility] care management for Transition of care follow up.  Royetta Crochet. Laymond Purser, RN, BSN, [place] [phone]) Business Cell  [phone]) Toll Free Office

## 2017-03-24 DIAGNOSIS — J9600 Acute respiratory failure, unspecified whether with hypoxia or hypercapnia: Secondary | ICD-10-CM | POA: Diagnosis not present

## 2017-03-24 DIAGNOSIS — F4312 Post-traumatic stress disorder, chronic: Secondary | ICD-10-CM | POA: Diagnosis not present

## 2017-03-24 DIAGNOSIS — Z7189 Other specified counseling: Secondary | ICD-10-CM | POA: Diagnosis not present

## 2017-03-29 ENCOUNTER — Other Ambulatory Visit: Payer: Self-pay | Admitting: *Deleted

## 2017-03-29 NOTE — Patient Outreach (Signed)
[facility]) Care Management  03/29/2017  Giovanni Biby 03/13/1925 [id number]  Transition of care  RN spoke with pt's daughter who indicates pt just arrived home but was not available. States pt was suppose to discharged on 3/15 but paid for two additional days at the facility with a discharge today and is currently with her sister [name]). Daughter requested RN to contact her sister [name]) who current has the pt. Daughter provided the contact number and requested RN to ask for the pt for permission to speak with the daughter [name]) if there is not answer requested RN to call pt's cell however mentioned the pt is very [place]. RN provided contact number and name and requested to contact the pt tomorrow through his daughter [name]. Will follow up tomorrow with post SNF discharge pt.  Raina Mina, RN Care Management Coordinator [place] Office [phone]

## 2017-03-30 ENCOUNTER — Encounter: Payer: Self-pay | Admitting: *Deleted

## 2017-03-30 ENCOUNTER — Other Ambulatory Visit: Payer: Self-pay | Admitting: *Deleted

## 2017-03-30 NOTE — Patient Outreach (Signed)
[place] [facility]) Care Management  03/30/2017  Nollan Muldrow 22-Aug-1925 [id number]    Transition of care  RN spoke with pt today and received permission to speak with all his daughter ([name] Maxwell Marion and Dara Hoyer) but today RN will discuss pt's recent discharged with Dara Hoyer. RN introduced the [facility] program and available services and inquired on pt's ongoing recovery. Caregiver reports pt is doing well and verified all medications, appointments, [facility] involvement and pending delivery of DME (nebulizer). Transition of care template completed along with discharge instructions. Daughter states pt was suppose to be discharged on 3/15 however requested to stay 2 additional days and was discharged on yesterday. Reports [facility] will be involved and currently awaiting on a delivery for pt's nebulizer for ongoing treatment from [facility] who was suppose to deliver last evening. RN strongly encouraged caregiver to contact the agency and inquire on the date and time of delivery as a prevention measure lowering the risk of re-infection via pneumonia symptoms. Stress the importance of prevention measures and encouraging all administration of medications including pt's scheduled nebulizer treatments.  Offered community case management for home visits if pt was in need of a more in-dept one-on-one consult however daughter has agree to ongoing transition of care with no immediate needs but differently would like for RN case management to follow up with the weekly transition of care calls over the next month.  Requested to complete a more indept assessment on the follow up call next week (caregiver agreed). Will inquired further on pt's history at that time.   Caregiver reports pt participates in the [place] and with [facility] program and will received his PT with [facility] at the facility. These arrangements have been confirmed and will be covered by the [place].  Reports pending follow up appointments with the [facility] provider on 3/25 and pt's local provider on 4/1 Dr. Noah Delaine. Based upon the above information a plan of care was discussed with goals and interventions. Note no issues with transition to all medical appointments and daughter have all medications under control with supplies and the needed refills. Will send a [facility] calendar and consent as pt would like all his daughter listed if they wish to inquire further on his health based upon the telephonic assessment with this RN case manager. Will request this information to be sent today. Offered to follow up next week as agreed with ongoing transition of care calls. Will also alert primary provider of pt's involvement.  Patient was recently discharged from hospital and all medications have been reviewed.  Raina Mina, RN Care Management Coordinator [place] Office [phone]

## 2017-04-01 ENCOUNTER — Telehealth: Payer: Self-pay | Admitting: Cardiovascular Disease

## 2017-04-01 DIAGNOSIS — I959 Hypotension, unspecified: Secondary | ICD-10-CM | POA: Diagnosis not present

## 2017-04-01 DIAGNOSIS — Z92241 Personal history of systemic steroid therapy: Secondary | ICD-10-CM | POA: Diagnosis not present

## 2017-04-01 DIAGNOSIS — N390 Urinary tract infection, site not specified: Secondary | ICD-10-CM | POA: Diagnosis not present

## 2017-04-01 NOTE — Telephone Encounter (Signed)
Spoke to daughter. She states patient was released from skilled rehab facility for  [facility] hospital visit pneumonia/ flu.  while at rehab - medication was given  amlodipine 10 mg daily,  change lisinopril 40 mg - ( lisinopril/hctz was d/c) started hydralazine .  Patient went to primary today and saw extender-  Concerned about patient's low blood pressure . 88/50. Suggest to hold amlodipine until appt with DR. C. EXTENDER would like for Dr C.to refer.  [facility] - UNABLE TO keep the appt with DR C.  RN was able to discuss with Dr C.  Verbal - decrease amlodipine to 5 mg , stop hydralazine.  Office will call if schedule opens for Thursday 04/08/17  daughter voice understanding.

## 2017-04-01 NOTE — Telephone Encounter (Signed)
New Message   Pt c/o BP issue:  1. What are your last 5 BP readings? 88/50 2. Are you having any other symptoms (ex. Dizziness, headache, blurred vision, passed out)? lightheadness    3. What is your medication issue?  Patients daughter states that her father was in the hospital and they made a lot of medication changes and since then his BP has been low.

## 2017-04-05 ENCOUNTER — Ambulatory Visit: Payer: Self-pay | Admitting: Cardiovascular Disease

## 2017-04-06 ENCOUNTER — Other Ambulatory Visit: Payer: Self-pay | Admitting: *Deleted

## 2017-04-06 ENCOUNTER — Encounter: Payer: Self-pay | Admitting: *Deleted

## 2017-04-06 NOTE — Patient Outreach (Signed)
[facility]) Care Management  04/06/2017  [name] [name] January 08, 1926 [id number]    Transition of care  RN spoke with pt's consented daughter [name] [name] who provided an update concerning pt's ongoing recovery. Note consent form mailed and received as the daughter will have the pt signs and return the form as discussed today. Indicates there was an issues with [facility] services however the RN has visited and currently pt is still awaiting PT to start. RN offered to intervention being that pt has been discharged since 3/15. Daughter [name] [name] indicated she had the contact for the director who she has contact before concerning a delay in services so she will reach out once again concerning the delay for PT services. States pt is doing much better with several changes in his medications (adjusted with a review of medication list today). Reports pt has finished his Prednisone yesterday and BP medication adjusted due to hypotension on several occassions. However due to swelling to his lower legs pt restarted on Lisinopril with HCTZ (stable with no additional issues). Reports pt breathing much better with use of his change with nebulizer treatments and will follow up with his CAD provided on Thursday to continue monitoring his blood pressure. RN will continue to offer further engagement however receptive again to the ongoing transition of care calls. Daughter states the calls helps her as reminders to stay focus on her father's care. Daughter very appreciative and grateful for the weekly calls. Plan of care discussed along with goals and interventions adjusted accordingly to allow pt adherence. Will continue to discuss safety measures to prevent acute events and episodes from occurring. Will re-evaluate pt's progress next week with another transition of care calls. No other resources or needs to address at this time. Will scheduled a call next week.    [name] Mina, RN Care Management Coordinator [place] Office [phone]

## 2017-04-08 ENCOUNTER — Encounter: Payer: Self-pay | Admitting: Cardiovascular Disease

## 2017-04-08 ENCOUNTER — Ambulatory Visit (INDEPENDENT_AMBULATORY_CARE_PROVIDER_SITE_OTHER): Payer: Medicare Other | Admitting: Cardiovascular Disease

## 2017-04-08 ENCOUNTER — Telehealth: Payer: Self-pay | Admitting: Cardiovascular Disease

## 2017-04-08 VITALS — BP 144/62 | HR 93 | Ht 71.0 in | Wt 205.0 lb

## 2017-04-08 DIAGNOSIS — I25118 Atherosclerotic heart disease of native coronary artery with other forms of angina pectoris: Secondary | ICD-10-CM | POA: Diagnosis not present

## 2017-04-08 DIAGNOSIS — I5033 Acute on chronic diastolic (congestive) heart failure: Secondary | ICD-10-CM

## 2017-04-08 DIAGNOSIS — I10 Essential (primary) hypertension: Secondary | ICD-10-CM | POA: Diagnosis not present

## 2017-04-08 DIAGNOSIS — I4891 Unspecified atrial fibrillation: Secondary | ICD-10-CM

## 2017-04-08 DIAGNOSIS — E7800 Pure hypercholesterolemia, unspecified: Secondary | ICD-10-CM

## 2017-04-08 MED ORDER — APIXABAN 5 MG PO TABS: 5.0000 mg | ORAL_TABLET | Freq: Two times a day (BID) | ORAL | 1 refills | Status: DC

## 2017-04-08 NOTE — Telephone Encounter (Signed)
New Message     *STAT* If patient is at the pharmacy, call can be transferred to refill team.   1. Which medications need to be refilled? (please list name of each medication and dose if known) apixaban (ELIQUIS) 5 MG TABS tablet    2. Which pharmacy/location (including street and city if local pharmacy) is [facility]   3. Do they need a 30 day or 90 day supply? 90 day   Patient daughter is calling in reference to rx that was [facility] to [facility]. They advised that the rx needs to be sent to the [place]. It would need to go to the attention of Dr. Crisoforo [facility]. The fax number is [phone]. It has the patients name as well as the last four digits of his social which is 48. Please call to discuss and a detail message can be left.

## 2017-04-08 NOTE — Patient Instructions (Addendum)
Dr [name] has recommended making the following medication changes: 1. START Eliquis 5 mg - take 1 tablet TWICE daily  Your physician has requested that you have an echocardiogram. Echocardiography is a painless test that uses sound waves to create images of your heart. It provides your doctor with information about the size and shape of your heart and how well your heart's chambers and valves are working. This procedure takes approximately one hour. There are no restrictions for this procedure. >>This will be performed at our [facility] location [place], [place] [address] [phone]  Your physician recommends that you schedule a follow-up appointment in 1 month with a NP/PA.  Dr [name] recommends that you schedule a follow-up appointment in 3 months.  If you need a refill on your cardiac medications before your next appointment, please call your pharmacy.

## 2017-04-08 NOTE — Progress Notes (Signed)
Patient ID: [name] [name], male   DOB: Jun 04, 1925, 84 y.o.   MRN: [id number]    Cardiology Office Note    Date:  04/11/2017   ID:  [name], DOB 18-Jun-1925, MRN [id number]  PCP:  [name], MD  Cardiologist:   [name], MD   Chief Complaint  Patient presents with  . Follow-up CAD, 18 months s/p PCI-DES    No complaints    History of Present Illness:  [name] [name] is a 84 y.o. male with CAD, returning for follow up.   Today in irregular rhythm.  ECG shows atrial fibrillation.  He is completely oblivious to the arrhythmia.  During his recent hospitalization his ECG showed normal sinus rhythm.  Hospitalized for influenza complicated by pneumonia in mid February.  After that he went to inpatient rehabilitation for about a month.  He is now going to [employer] day enrichment.  Mild acute renal insufficiency during that hospitalization.  Lisinopril hydrochlorothiazide was stopped and replaced with hydralazine and long-acting nitrates.  He developed shortness of breath and edema and was started back on lisinopril HCT.  Hydralazine has been stopped.  He has improved. He was seen on March 21 and his blood pressure was low at 80/50.  It seems that he had been inadvertently receiving a larger dose of amlodipine than prescribed.  The amlodipine was stopped.  His blood pressure today is slightly high at 144/62. He feels well.  The patient specifically denies any chest pain at rest or exertion, dyspnea at rest or with exertion, orthopnea, paroxysmal nocturnal dyspnea, syncope, palpitations, focal neurological deficits, intermittent claudication, lower extremity edema, unexplained weight gain, cough, hemoptysis or wheezing.  He is primarily limited by unsteady gait due to neuropathy. He does not have new focal neurological deficits. he is chronically hard of hearing.  Labs performed at the [facility] on March 28 show hemoglobin A1c 6%, creatinine 0.9, potassium 3.9, normal liver function  tests, total cholesterol 119, triglycerides 64, HDL 45, LDL 61  Cardiac cath on May 08, 2014 showed normal left ventricle systolic function and mild distal anterolateral hypokinesis. He had chronic total occlusion of the proximal LAD with distal filling via left to left and right-to-left collaterals. Mild to moderate lesions were seen in the circumflex and right coronary artery system and a new 95% stenosis in the distal right coronary artery just proximal to the ostium of the PDA with jeopardy to the LAD collaterals. He received a drug-eluting stent (3.518 mm Xience Alpine) to the right coronary artery.  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  Attempts to wean off isosorbide led to recurrence of chest discomfort.    Past Medical History:  Diagnosis Date  . Anginal pain ([place])   . Arthritis    "all over"  . Coronary artery disease   . Depression ~ 43; ~ 1990   PTSD/Mania   . GERD (gastroesophageal reflux disease)   . High cholesterol   . Hypertension   . Hyperthyroidism   . Neuropathy   . Osteoporosis   . Skin cancer of nose     Past Surgical History:  Procedure Laterality Date  . BACK SURGERY    . CARDIAC CATHETERIZATION  09/2007  . CARDIAC CATHETERIZATION N/A 05/08/2014   Procedure: CORONARY STENT INTERVENTION;  Surgeon: [name], MD; 3.518 mm Xience Alpine DES to the RCA   . LEFT HEART CATHETERIZATION WITH CORONARY ANGIOGRAM N/A 05/08/2014  Procedure: LEFT HEART CATHETERIZATION WITH CORONARY ANGIOGRAM;  Surgeon: [name], MD; LAD 100% (old), CFX 20%, OM1 30%, pRCA 30%, dRCA 95%, EF 55%     . LUMBAR LAMINECTOMY/DECOMPRESSION MICRODISCECTOMY  2011    Outpatient Medications Prior to Visit  Medication Sig Dispense Refill  . acetaminophen (TYLENOL) 500 MG tablet Take 500-1,000 mg by mouth every 6 (six) hours as needed for mild pain (pain).     [name] albuterol (PROVENTIL) (2.5 MG/3ML) 0.083%  nebulizer solution Take 3 mLs (2.5 mg total) by nebulization every 6 (six) hours as needed for wheezing or shortness of breath. 75 mL 12  . amLODipine (NORVASC) 10 MG tablet Take 5 mg by mouth daily.    [name] aspirin 81 MG tablet Take 1 tablet (81 mg total) by mouth daily. 30 tablet 11  . atorvastatin (LIPITOR) 10 MG tablet Take 1 tablet (10 mg total) by mouth daily. 30 tablet 3  . calcium-vitamin D (OSCAL WITH D) 500-200 MG-UNIT per tablet Take 1 tablet by mouth 2 (two) times daily.     . Carboxymethylcellulose Sod PF 1 % GEL Place 1 drop into both eyes at bedtime.    . Cyanocobalamin (B-12 COMPLIANCE INJECTION) 1000 MCG/ML KIT Inject 1,000 mcg as directed every 30 (thirty) days. Patient takes the first Sunday each month    . finasteride (PROSCAR) 5 MG tablet Take 5 mg by mouth daily.    . fluticasone (FLONASE) 50 MCG/ACT nasal spray Place 2 sprays into both nostrils daily. 16 g 2  . guaiFENesin (MUCINEX) 600 MG 12 hr tablet Take 1 tablet (600 mg total) by mouth 2 (two) times daily. 60 tablet 3  . ipratropium-albuterol (DUONEB) 0.5-2.5 (3) MG/3ML SOLN Take 3 mLs by nebulization 3 (three) times daily. (Patient taking differently: Take 3 mLs by nebulization 2 (two) times daily. ) 360 mL 2  . isosorbide mononitrate (IMDUR) 60 MG 24 hr tablet Take 1 tablet (60 mg total) by mouth daily. (Patient taking differently: Take 30 mg by mouth daily. ) 30 tablet 3  . lisinopril-hydrochlorothiazide (PRINZIDE,ZESTORETIC) 20-12.5 MG tablet Take 2 tablets by mouth daily.    [name] loratadine (CLARITIN) 10 MG tablet Take 1 tablet (10 mg total) by mouth daily. 30 tablet 3  . Melatonin 3 MG TABS Take 3 mg by mouth every evening.     . metoprolol (LOPRESSOR) 100 MG tablet Take 100 mg by mouth 2 (two) times daily.    . Multiple Vitamins-Minerals (PRESERVISION AREDS 2+MULTI VIT) CAPS Take 2 capsules by mouth daily.    . Omega-3 Fatty Acids (FISH OIL) 1200 MG CAPS Take 1 capsule by mouth daily.    . pantoprazole (PROTONIX) 40 MG  tablet Take 1 tablet (40 mg total) by mouth daily. 30 tablet 11  . tamsulosin (FLOMAX) 0.4 MG CAPS capsule Take 1 capsule (0.4 mg total) by mouth daily after supper. 30 capsule 0  . tiotropium (SPIRIVA HANDIHALER) 18 MCG inhalation capsule Place 1 capsule (18 mcg total) into inhaler and inhale daily. 30 capsule 2  . traZODone (DESYREL) 50 MG tablet Take 50 mg by mouth at bedtime.     [name] amLODipine (NORVASC) 10 MG tablet Take 1 tablet (10 mg total) by mouth daily. Take one tab by mouth daily (Patient taking differently: Take 5 mg by mouth daily. Take one tab by mouth daily) 30 tablet 3  . hydrALAZINE (APRESOLINE) 10 MG tablet Take 1 tablet (10 mg total) by mouth 3 (three) times daily. (Patient not taking: Reported on 04/06/2017) 90  tablet 2  . lisinopril (PRINIVIL,ZESTRIL) 40 MG tablet Take 1 tablet (40 mg total) by mouth daily. (Patient not taking: Reported on 04/06/2017) 30 tablet 3  . predniSONE (DELTASONE) 20 MG tablet Take 1 tablet (20 mg total) by mouth daily with breakfast. (Patient not taking: Reported on 04/06/2017) 5 tablet 0   No facility-administered medications prior to visit.      Allergies:   Cortisone   Social History   Socioeconomic History  . Marital status: Divorced    Spouse name: Not on file  . Number of children: Not on file  . Years of education: Not on file  . Highest education level: Not on file  Occupational History  . Not on file  Social Needs  . Financial resource strain: Not on file  . Food insecurity:    Worry: Not on file    Inability: Not on file  . Transportation needs:    Medical: Not on file    Non-medical: Not on file  Tobacco Use  . Smoking status: Former Smoker    Packs/day: 1.00    Years: 20.00    Pack years: 20.00    Types: Cigarettes    Last attempt to quit: 01/12/1962    Years since quitting: 55.2  . Smokeless tobacco: Never Used  Substance and Sexual Activity  . Alcohol use: No    Alcohol/week: 0.0 oz    Comment: 05/08/2014 "he's been in  [facility] for ~!35 yrs"  . Drug use: No  . Sexual activity: Not on file  Lifestyle  . Physical activity:    Days per week: Not on file    Minutes per session: Not on file  . Stress: Not on file  Relationships  . Social connections:    Talks on phone: Not on file    Gets together: Not on file    Attends religious service: Not on file    Active member of club or organization: Not on file    Attends meetings of clubs or organizations: Not on file    Relationship status: Not on file  Other Topics Concern  . Not on file  Social History Narrative   He was in [name] youth camps as a child. He emigrated to the [place] after World War II. He was staying with his host family in [place] when he got drafted for [place].      He now lives in [place] and his daughter helps in his care.       ROS:   Please see the history of present illness.    ROS All other systems reviewed and are negative.   PHYSICAL EXAM:   VS:  BP (!) 144/62   Pulse 93   Ht _0  (1.803 m)   Wt 205 lb (93 kg)   BMI 28.59 kg/m     General: Alert, oriented x3, no distress, overweight Head: no evidence of trauma, PERRL, EOMI, no exophtalmos or lid lag, no myxedema, no xanthelasma; normal ears, nose and oropharynx Neck: normal jugular venous pulsations and no hepatojugular reflux; brisk carotid pulses without delay and no carotid bruits Chest: clear to auscultation, no signs of consolidation by percussion or palpation, normal fremitus, symmetrical and full respiratory excursions Cardiovascular: normal position and quality of the apical impulse, irregular rhythm, normal first and second heart sounds, no murmurs, rubs or gallops Abdomen: no tenderness or distention, no masses by palpation, no abnormal pulsatility or arterial bruits, normal bowel sounds, no hepatosplenomegaly Extremities: no clubbing, cyanosis or  edema; 2+ radial, ulnar and brachial pulses bilaterally; 2+ right femoral, posterior tibial and dorsalis  pedis pulses; 2+ left femoral, posterior tibial and dorsalis pedis pulses; no subclavian or femoral bruits Neurological: grossly nonfocal, very hard of hearing Psych: Normal mood and affect   Wt Readings from Last 3 Encounters:  04/08/17 205 lb (93 kg)  02/23/17 203 lb 14.8 oz (92.5 kg)  08/10/16 205 lb (93 kg)      Studies/Labs Reviewed:   EKG:  EKG is ordered and shows atrial fibrillation with controlled rate, pre-existing right bundle branch block and chronic inferior T wave inversion.  QRS 136 ms, QTC 467 ms.  [facility] on March 28 show  hemoglobin A1c 6%, creatinine 0.9, potassium 3.9, normal liver function tests total cholesterol 119, triglycerides 64, HDL 45, LDL 61  Lipid Panel    Component Value Date/Time   CHOL 111 05/10/2014 1015   TRIG 60 05/10/2014 1015   HDL 30 (L) 05/10/2014 1015   CHOLHDL 3.7 05/10/2014 1015   VLDL 12 05/10/2014 1015   LDLCALC 69 05/10/2014 1015     ASSESSMENT:    1. New onset atrial fibrillation ([place])   2. Acute on chronic diastolic congestive heart failure ([place])   3. Coronary artery disease of native artery of native heart with stable angina pectoris ([place])   4. Essential hypertension   5. Hypercholesterolemia      PLAN:  In order of problems listed above:  1. AFib: Presentation with atrial fibrillation.  Rate is well controlled on beta-blockers.  He is completely asymptomatic.  He does not have a history of stroke or TIA but has high embolic risk.  CHADSVasc 5 (age 53, HTN, CAD, CHF).  Elderly but has not had serious bleeding problems or falls.  He is mostly sedentary or in a wheelchair.  We will initiate anticoagulation therapy.  Discussed the risk of bleeding which is outstripped by the risk of embolic stroke.  Renal function is now normal.  Qualifies for full dose Eliquis. 2. Ac/chr diastolic HF: Multiple medication changes were made due to his hospitalization with the flu.  I think this led to some degree of fluid  accumulation, no better that he is back on his original medications.  Review his echocardiogram.  In the past he had normal left ventricular systolic function.  Clinically, he appears to be euvolemic at this time. 3. CAD: He is asymptomatic but requires combination beta-blocker, long-acting nitrate, calcium channel blocker to maintain symptom relief. Has chronic occlusion of the LAD artery and a stent placed in the right coronary artery 2 years ago.  4. HTN: Chronic problems with neuropathy, low diastolic blood pressure symptoms of orthostatic hypotension.  Avoid "perfect" control of his systolic blood pressure.    5. HLP: [facility].    Medication Adjustments/Labs and Tests Ordered: Current medicines are reviewed at length with the patient today.  Concerns regarding medicines are outlined above.  Medication changes, Labs and Tests ordered today are listed in the Patient Instructions below. Patient Instructions  Dr [name] has recommended making the following medication changes: 1. START Eliquis 5 mg - take 1 tablet TWICE daily  Your physician has requested that you have an echocardiogram. Echocardiography is a painless test that uses sound waves to create images of your heart. It provides your doctor with information about the size and shape of your heart and how well your heart's chambers and valves are working. This procedure  takes approximately one hour. There are no restrictions for this procedure. >>This will be performed at our [facility] location [place], [place] [address] [phone]  Your physician recommends that you schedule a follow-up appointment in 1 month with a NP/PA.  Dr [name] recommends that you schedule a follow-up appointment in 3 months.  If you need a refill on your cardiac medications before your next appointment, please call your pharmacy.      Signed, [name], MD    04/11/2017 11:57 AM    [facility] [place], [place], [place]  [address] Phone: [phone]; Fax: [phone]

## 2017-04-11 DIAGNOSIS — I482 Chronic atrial fibrillation, unspecified: Secondary | ICD-10-CM | POA: Insufficient documentation

## 2017-04-11 DIAGNOSIS — I5033 Acute on chronic diastolic (congestive) heart failure: Secondary | ICD-10-CM | POA: Insufficient documentation

## 2017-04-12 ENCOUNTER — Other Ambulatory Visit: Payer: Self-pay | Admitting: *Deleted

## 2017-04-12 ENCOUNTER — Ambulatory Visit: Payer: Self-pay | Admitting: *Deleted

## 2017-04-12 DIAGNOSIS — D81818 Other biotin-dependent carboxylase deficiency: Secondary | ICD-10-CM | POA: Diagnosis not present

## 2017-04-12 DIAGNOSIS — I4891 Unspecified atrial fibrillation: Secondary | ICD-10-CM | POA: Diagnosis not present

## 2017-04-12 DIAGNOSIS — I2510 Atherosclerotic heart disease of native coronary artery without angina pectoris: Secondary | ICD-10-CM | POA: Diagnosis not present

## 2017-04-12 DIAGNOSIS — M1990 Unspecified osteoarthritis, unspecified site: Secondary | ICD-10-CM | POA: Diagnosis not present

## 2017-04-12 DIAGNOSIS — E785 Hyperlipidemia, unspecified: Secondary | ICD-10-CM | POA: Diagnosis not present

## 2017-04-12 DIAGNOSIS — I10 Essential (primary) hypertension: Secondary | ICD-10-CM | POA: Diagnosis not present

## 2017-04-12 DIAGNOSIS — Z8673 Personal history of transient ischemic attack (TIA), and cerebral infarction without residual deficits: Secondary | ICD-10-CM | POA: Diagnosis not present

## 2017-04-12 NOTE — Patient Outreach (Signed)
[facility]) Care Management  04/12/2017  Ryane Canavan 1925/01/26 [id number]    Transition of care (unsuccessful)  RN attempted outreach call today however unable to leave a voice message due to mail box was full. Will attempt additional outreach all on tomorrow however informed to call daughter [name] [phone].   Raina Mina, RN Care Management Coordinator [place] Office [phone]

## 2017-04-13 ENCOUNTER — Other Ambulatory Visit: Payer: Self-pay | Admitting: *Deleted

## 2017-04-13 NOTE — Patient Outreach (Addendum)
[name] [facility]) Care Management  04/13/2017  [name] [name] Nov 09, 1925 [id number]      Transition of care (Unsuccessful)  RN spoke with the consented daughter [name] who indicates pt is doing very well with ongoing services with the adult daycare ([facility]), PT/OT/Speech/RN and pending LCSW ([facility]), CNA Cytogeneticist). Caregiver states the [place] will cover all services. States pt recently experienced Atrial Fibrillation however the CAD doctor feels pt is in and out of this episodes. Pt placed on Eliquis and will continue to monitor and undergo a Echo on 4/8. Plan of care discussed with goals and interventions adjusted according to pt's progress. RN strongly encouraged pt adherence and continue to offer any community resources that may be need at this time. Caregiver states pt will need a shower chair with no arms recommended by PT. Daughter will check with the [place] for coverage however if not RN has provided additional locations to obtain this shower chair. RN continue to mentions if needed community home visits however daughter very receptive to ongoing transition of care calls. Will follow up next week on pt's progress. Note consent mailed and received along with a [facility] calendar. Requested the consent be sent back to [facility] office.  Raina Mina, RN Care Management Coordinator [place] Office [phone]

## 2017-04-15 ENCOUNTER — Other Ambulatory Visit (HOSPITAL_COMMUNITY): Payer: Self-pay

## 2017-04-19 ENCOUNTER — Ambulatory Visit (HOSPITAL_COMMUNITY): Payer: Medicare Other | Attending: Cardiology

## 2017-04-19 ENCOUNTER — Other Ambulatory Visit: Payer: Self-pay

## 2017-04-19 DIAGNOSIS — I2510 Atherosclerotic heart disease of native coronary artery without angina pectoris: Secondary | ICD-10-CM | POA: Insufficient documentation

## 2017-04-19 DIAGNOSIS — I77810 Thoracic aortic ectasia: Secondary | ICD-10-CM | POA: Diagnosis not present

## 2017-04-19 DIAGNOSIS — I5033 Acute on chronic diastolic (congestive) heart failure: Secondary | ICD-10-CM | POA: Insufficient documentation

## 2017-04-19 DIAGNOSIS — I340 Nonrheumatic mitral (valve) insufficiency: Secondary | ICD-10-CM | POA: Diagnosis not present

## 2017-04-19 DIAGNOSIS — I110 Hypertensive heart disease with heart failure: Secondary | ICD-10-CM | POA: Insufficient documentation

## 2017-04-19 DIAGNOSIS — I4891 Unspecified atrial fibrillation: Secondary | ICD-10-CM | POA: Insufficient documentation

## 2017-04-19 DIAGNOSIS — E785 Hyperlipidemia, unspecified: Secondary | ICD-10-CM | POA: Insufficient documentation

## 2017-04-20 ENCOUNTER — Telehealth: Payer: Self-pay | Admitting: Cardiovascular Disease

## 2017-04-20 ENCOUNTER — Other Ambulatory Visit: Payer: Self-pay | Admitting: *Deleted

## 2017-04-20 MED ORDER — APIXABAN 5 MG PO TABS: 5.0000 mg | ORAL_TABLET | Freq: Two times a day (BID) | ORAL | 3 refills | Status: DC

## 2017-04-20 NOTE — Telephone Encounter (Signed)
Routed to [place] CMA 

## 2017-04-20 NOTE — Patient Outreach (Signed)
[facility]) Care Management  04/20/2017  Silus Lanzo [birth year] [id number]    Transition of care  RN attempted outreach call today however unsuccessful however was able to leave a HIPPA message to daughter's contact requesting a call back. Will follow again this week with another outreach call.  Raina Mina, RN Care Management Coordinator [place] Office [phone]

## 2017-04-20 NOTE — Telephone Encounter (Signed)
°  Pt c/o medication issue:  1. Name of Medication: Eliquis   2. How are you currently taking this medication (dosage and times per day)? 5 mg 2x daily   3. Are you having a reaction (difficulty breathing--STAT)? No  4. What is your medication issue? Patient daughter [name]) calling,  States that she needs the office to fax the presciption for Eliquis faxed to the [place] at [phone].  [name] asked that we put the following information on paperwork: "Josha Weekley  DOB 1925/10/03   SSN [id number] ATTN Dr. Kandyce [facility] "

## 2017-04-21 ENCOUNTER — Encounter: Payer: Self-pay | Admitting: Cardiovascular Disease

## 2017-04-21 ENCOUNTER — Ambulatory Visit: Payer: Self-pay | Admitting: *Deleted

## 2017-04-21 ENCOUNTER — Other Ambulatory Visit: Payer: Self-pay | Admitting: *Deleted

## 2017-04-21 NOTE — Patient Outreach (Signed)
[facility]) Care Management  04/21/2017  [name] [name] 08-08-1925 [id number]    Transition of care  RN received a voice message from daughter Stanton Kidney returning the call indicating pt was doing well and had an eye appointment on the day RN attempted to call another daughter [name] who usually provides RN the weekly update. States pt is stable other then his vision issues. Reports pt recently completed his ECHO and currently pending an appointment with the provider to discuss the results. Message ended but RN will follow up later this week and update plan of care and interventions accordingly.  Raina Mina, RN Care Management Coordinator [place] Office [phone]

## 2017-04-22 NOTE — Telephone Encounter (Signed)
Faxed to [facility] per instructions below.

## 2017-04-23 ENCOUNTER — Ambulatory Visit: Payer: Self-pay | Admitting: *Deleted

## 2017-04-26 ENCOUNTER — Other Ambulatory Visit: Payer: Self-pay | Admitting: *Deleted

## 2017-04-26 NOTE — Patient Outreach (Signed)
[facility]) Care Management  04/26/2017  Trice Aspinall 1925/11/08 [id number]    Transition of care (successful)  RN spoke with daughter Laurence Spates who indicated pt continue to do well with no acute issues. States pt continue to attend adult daycare, comfort keepers involved and [facility] for PT/OT/RN. States pt continue to recovery very well with no acute issues. Plan of care discussed along with goals and interventions. Adjusted according to pt's progress and will re-evaluate next month on the last call if pt continue to be adherence with this care plan. RN informed caregiver that information is in the calendar concerning pt's new diagnosis for atrial fibrillation. Pt has been prescribed Eliquis for this condition with no additional issues. Daughter very appreciative for the information sent via [facility] calendar. Will follow up with another call next month with plans to graduate pt from the [facility] program if he remains stable with no additional symptoms.   Raina Mina, RN Care Management Coordinator [place] Office [phone]

## 2017-05-03 NOTE — Telephone Encounter (Signed)
See telephone encounter from 04/20/17.

## 2017-05-04 ENCOUNTER — Telehealth: Payer: Self-pay | Admitting: Cardiovascular Disease

## 2017-05-04 DIAGNOSIS — E785 Hyperlipidemia, unspecified: Secondary | ICD-10-CM | POA: Diagnosis not present

## 2017-05-04 DIAGNOSIS — J189 Pneumonia, unspecified organism: Secondary | ICD-10-CM | POA: Diagnosis not present

## 2017-05-04 DIAGNOSIS — Z8701 Personal history of pneumonia (recurrent): Secondary | ICD-10-CM | POA: Diagnosis not present

## 2017-05-04 DIAGNOSIS — I4891 Unspecified atrial fibrillation: Secondary | ICD-10-CM | POA: Diagnosis not present

## 2017-05-04 DIAGNOSIS — I10 Essential (primary) hypertension: Secondary | ICD-10-CM | POA: Diagnosis not present

## 2017-05-04 DIAGNOSIS — I2510 Atherosclerotic heart disease of native coronary artery without angina pectoris: Secondary | ICD-10-CM | POA: Diagnosis not present

## 2017-05-04 NOTE — Telephone Encounter (Signed)
Returned call to patients daughter [name].She stated father started coughing up small amounts of bright red this morning.Stated he always breathes hard.No chest pain.Advised to go to [facility] ED.Stated she is waiting on a call back from PCP.Advised Dr.Croitoru is out of office.I will make him aware.

## 2017-05-04 NOTE — Telephone Encounter (Signed)
New Message:    Pt is coughing up bright red blood,she is concerned ,pt is on Eliquis.

## 2017-05-10 ENCOUNTER — Ambulatory Visit (INDEPENDENT_AMBULATORY_CARE_PROVIDER_SITE_OTHER): Payer: Medicare Other | Admitting: Physician Assistant

## 2017-05-10 ENCOUNTER — Encounter: Payer: Self-pay | Admitting: Physician Assistant

## 2017-05-10 VITALS — BP 124/74 | HR 80 | Ht 71.0 in | Wt 201.0 lb

## 2017-05-10 DIAGNOSIS — R5381 Other malaise: Secondary | ICD-10-CM

## 2017-05-10 DIAGNOSIS — I5032 Chronic diastolic (congestive) heart failure: Secondary | ICD-10-CM

## 2017-05-10 DIAGNOSIS — I25118 Atherosclerotic heart disease of native coronary artery with other forms of angina pectoris: Secondary | ICD-10-CM | POA: Diagnosis not present

## 2017-05-10 DIAGNOSIS — I481 Persistent atrial fibrillation: Secondary | ICD-10-CM | POA: Diagnosis not present

## 2017-05-10 DIAGNOSIS — I10 Essential (primary) hypertension: Secondary | ICD-10-CM | POA: Diagnosis not present

## 2017-05-10 DIAGNOSIS — R042 Hemoptysis: Secondary | ICD-10-CM | POA: Diagnosis not present

## 2017-05-10 DIAGNOSIS — Z7901 Long term (current) use of anticoagulants: Secondary | ICD-10-CM

## 2017-05-10 DIAGNOSIS — I4819 Other persistent atrial fibrillation: Secondary | ICD-10-CM

## 2017-05-10 MED ORDER — LISINOPRIL-HYDROCHLOROTHIAZIDE 20-25 MG PO TABS: 1.0000 | ORAL_TABLET | Freq: Every day | ORAL | 3 refills | Status: DC

## 2017-05-10 MED ORDER — LISINOPRIL-HYDROCHLOROTHIAZIDE 20-25 MG PO TABS: 1.0000 | ORAL_TABLET | Freq: Every day | ORAL | 2 refills | Status: DC

## 2017-05-10 NOTE — Progress Notes (Signed)
Danke schoen

## 2017-05-10 NOTE — Telephone Encounter (Signed)
Patient seen by [name], PA today.

## 2017-05-10 NOTE — Progress Notes (Signed)
Cardiology Office Note   Date:  05/10/2017   ID:  [name] [name], DOB December 05, 1925, MRN [id number]  PCP:  [name], MD  Cardiologist: Dr. [name], 04/08/2017 [name], PA-C 12/02/2015   History of Present Illness: [name] [name] is a 84 y.o. male who was in [name], with a history of chronic LAD occlusion w/ DES RCA 2016 by Dr Claiborne Billings, GERD, HTN, HLD, hyperthyroid, peripheral neuropathy (feet). EF 50%. Imdur used periodically for angina. HOH (both he and his older daughter). RBBB  4/23 phone notes reviewed regarding hemoptysis on Eliquis 3/28 office visit, patient in atrial fibrillation but asymptomatic, previous ECG sinus rhythm, started on Eliquis, gait unsteady due to neuropathy, CHADSVasc 5 (age 7, HTN, CAD, CHF).  Euvolemic at 205 pounds, some permissive hypertension to avoid hypotension Admitted 2/12-2/16/2019 for flu, then to rehab. Lisinopril HCTZ stopped due to renal insufficiency>>added hydralazine and nitrates but started back on lisinopril HCTZ due to edema, hydralazine stopped, amlodipine decreased due to hypotension  [name] [name] presents for cardiology follow up.  One daughter is with him, another one is on speaker phone.  He had hemoptysis and saw his PCP. CXR showed that his PNA had probably resolved, chest CT pending.  His PCP thought it was most likely a burst capillary from coughing and does not expect a recurrence.    He has not had any more episodes of hemoptysis.  He has not had any chest pain.  He denies shortness of breath with exertion.  His family states that he has had some lower extremity edema and they feel like he has been waking with it, but he does not have any on exam.  He does not exert himself very much.  He goes to the [facility] 4 days a week.  They are weighing him regularly and checking his blood pressure.  He does not walk very much there.  He was getting physical therapy and Occupational Therapy but the therapies  have ended.  Upon review of the blood pressure records, he has had several systolic pressures in the 100/110 range, and a systolic blood pressure once was 99.  He does not recall being lightheaded or dizzy at any time.  However, he is chronically unsteady on his feet.  The weights at the center are within a pound or 2 of 202 pounds.    Past Medical History:  Diagnosis Date  . Anginal pain ([place])   . Arthritis    "all over"  . Coronary artery disease   . Depression ~ 25; ~ 1990   PTSD/Mania   . GERD (gastroesophageal reflux disease)   . High cholesterol   . Hypertension   . Hyperthyroidism   . Neuropathy   . Osteoporosis   . Skin cancer of nose     Past Surgical History:  Procedure Laterality Date  . BACK SURGERY    . CARDIAC CATHETERIZATION  09/2007  . CARDIAC CATHETERIZATION N/A 05/08/2014   Procedure: CORONARY STENT INTERVENTION;  Surgeon: [name], MD; 3.518 mm Xience Alpine DES to the RCA   . LEFT HEART CATHETERIZATION WITH CORONARY ANGIOGRAM N/A 05/08/2014   Procedure: LEFT HEART CATHETERIZATION WITH CORONARY ANGIOGRAM;  Surgeon: [name], MD; LAD 100% (old), CFX 20%, OM1 30%, pRCA 30%, dRCA 95%, EF 55%     . LUMBAR LAMINECTOMY/DECOMPRESSION MICRODISCECTOMY  2011    Current Outpatient Medications  Medication Sig Dispense Refill  . acetaminophen (TYLENOL) 500 MG tablet Take 500-1,000 mg by mouth every  6 (six) hours as needed for mild pain (pain).     [name] amLODipine (NORVASC) 10 MG tablet Take 5 mg by mouth daily.    [name] apixaban (ELIQUIS) 5 MG TABS tablet Take 1 tablet (5 mg total) by mouth 2 (two) times daily. (Patient taking differently: Take 5 mg by mouth daily. ) 180 tablet 3  . atorvastatin (LIPITOR) 10 MG tablet Take 1 tablet (10 mg total) by mouth daily. 30 tablet 3  . calcium-vitamin D (OSCAL WITH D) 500-200 MG-UNIT per tablet Take 1 tablet by mouth 2 (two) times daily.     . Carboxymethylcellulose Sod PF 1 % GEL Place 1 drop into both eyes at bedtime.      . Cyanocobalamin (B-12 COMPLIANCE INJECTION) 1000 MCG/ML KIT Inject 1,000 mcg as directed every 30 (thirty) days. Patient takes the first Sunday each month    . finasteride (PROSCAR) 5 MG tablet Take 5 mg by mouth daily.    [name] ipratropium-albuterol (DUONEB) 0.5-2.5 (3) MG/3ML SOLN Take 3 mLs by nebulization 3 (three) times daily. (Patient taking differently: Take 3 mLs by nebulization 2 (two) times daily. ) 360 mL 2  . isosorbide mononitrate (IMDUR) 30 MG 24 hr tablet Take 30 mg by mouth daily.    [name] lisinopril-hydrochlorothiazide (PRINZIDE,ZESTORETIC) 20-12.5 MG tablet Take 2 tablets by mouth daily.    [name] loratadine (CLARITIN) 10 MG tablet Take 1 tablet (10 mg total) by mouth daily. 30 tablet 3  . Melatonin 3 MG TABS Take 3 mg by mouth every evening.     . metoprolol (LOPRESSOR) 100 MG tablet Take 100 mg by mouth 2 (two) times daily.    . Multiple Vitamins-Minerals (PRESERVISION AREDS 2+MULTI VIT) CAPS Take 2 capsules by mouth daily.    . Omega-3 Fatty Acids (FISH OIL) 1200 MG CAPS Take 1 capsule by mouth daily.    . pantoprazole (PROTONIX) 40 MG tablet Take 1 tablet (40 mg total) by mouth daily. 30 tablet 11  . tamsulosin (FLOMAX) 0.4 MG CAPS capsule Take 1 capsule (0.4 mg total) by mouth daily after supper. 30 capsule 0  . tiotropium (SPIRIVA HANDIHALER) 18 MCG inhalation capsule Place 1 capsule (18 mcg total) into inhaler and inhale daily. 30 capsule 2  . traZODone (DESYREL) 50 MG tablet Take 50 mg by mouth at bedtime.      No current facility-administered medications for this visit.     Allergies:   Cortisone    Social History:  The patient  reports that he quit smoking about 55 years ago. His smoking use included cigarettes. He has a 20.00 pack-year smoking history. He has never used smokeless tobacco. He reports that he does not drink alcohol or use drugs.   Family History:  The patient's family history includes Asthma in his mother; CAD in his mother.    ROS:  Please see the history  of present illness. All other systems are reviewed and negative.    PHYSICAL EXAM: VS:  BP 124/74 (BP Location: Left Arm, Patient Position: Sitting, Cuff Size: Large)   Pulse 80   Ht '5\' 11"'$  (1.803 m)   Wt 201 lb (91.2 kg)   BMI 28.03 kg/m  , BMI Body mass index is 28.03 kg/m. GEN: Well nourished, well developed, male in no acute distress  HEENT: normal for age  Neck: Minimal JVD, no carotid bruit, no masses Cardiac: Irreg R&R; no murmur, no rubs, or gallops Respiratory: Decreased breath sounds bases but clear bilaterally, normal work of breathing GI: soft, nontender,  nondistended, + BS MS: no deformity or atrophy; trace pedal edema; distal pulses are 1-2+ in all 4 extremities   Skin: warm and dry, no rash Neuro:  Strength and sensation are intact Psych: euthymic mood, full affect   EKG:  EKG is not ordered today.  ECHO: 04/19/2017 - Left ventricle: The cavity size was normal. Wall thickness was   increased in a pattern of mild LVH. There was mild focal basal   hypertrophy of the septum. Systolic function was normal. The   estimated ejection fraction was in the range of 50% to 55%. Wall   motion was normal; there were no regional wall motion   abnormalities. - Aortic valve: There was trivial regurgitation. - Ascending aorta: The ascending aorta was mildly dilated. - Mitral valve: There was mild regurgitation. - Left atrium: The atrium was severely dilated. - Pulmonary arteries: PA peak pressure: 33 mm Hg (S).  Recent Labs: 02/23/2017: ALT 27; B Natriuretic Peptide 91.3 02/25/2017: Hemoglobin 13.9; Platelets 160 02/26/2017: BUN 25; Creatinine, Ser 1.11; Magnesium 2.0; Potassium 4.1; Sodium 138    Lipid Panel    Component Value Date/Time   CHOL 111 05/10/2014 1015   TRIG 60 05/10/2014 1015   HDL 30 (L) 05/10/2014 1015   CHOLHDL 3.7 05/10/2014 1015   VLDL 12 05/10/2014 1015   LDLCALC 69 05/10/2014 1015     Wt Readings from Last 3 Encounters:  05/10/17 201 lb (91.2 kg)    04/08/17 205 lb (93 kg)  02/23/17 203 lb 14.8 oz (92.5 kg)     Other studies Reviewed: Additional studies/ records that were reviewed today include: Office notes, hospital records and testing.  ASSESSMENT AND PLAN:  1.  Chronic diastolic CHF: His weight is at or below baseline.  He denies respiratory issues and has no significant volume overload on exam.  Continue HCTZ, totaling 25 mg a day.  2.  Hypertension: He is currently taking lisinopril HCTZ 20/12.5 mg twice daily totaling lisinopril 40/25 mg.  His weight has been stable on HCTZ 25 mg a day continue this.  However, some of his blood pressures have been borderline.  Therefore, I will decrease the lisinopril portion to 20 mg a day.  So he can take lisinopril HCTZ 20/25 once a day.  Continue the amlodipine 5 mg a day, Proscar 5 mg a day, Imdur 30 mg a day, Flomax 0.4 mg a day and metoprolol 100 mg twice daily.  -The family requests a prescription be sent to Dr. Kandyce Rud at the [facility], fax number [phone].  Last 4 numbers of his Social Security number are [phone], this must also be on the prescription.  We will also send 30-day prescription to the local pharmacy so he can go ahead and start on this.  3.  Persistent atrial fibrillation: His heart rate is still irregular on exam.  He has no symptoms attributable to the atrial fibrillation, continue beta-blocker for rate control  4.  Anticoagulation: Since the one episode, he has had no problems with the pump to assist in no other bleeding issues.  Advised the family that if he continues to have bleeding issues, we will reevaluate the risk versus the benefit of the Eliquis, for now continue it.  5.  Hemoptysis: He had a brief and small amount of hemoptysis, hemoglobin and hematocrit were checked and were normal.  A chest x-ray was also okay, a chest CT is pending.  6.  Deconditioning: He has completed PT and OT.  He still needs to build up his strength more. -We will ask the  staff at the [facility] to walk him 3 times a day   Current medicines are reviewed at length with the patient today.  The patient's daughters have concerns regarding medicines.  Concerns were addressed  The following changes have been made: Decrease the lisinopril, keep the HCTZ the same  Labs/ tests ordered today include:  No orders of the defined types were placed in this encounter.   Disposition:   FU with Dr. [name]  Signed, [name], PA-C  05/10/2017 4:10 PM    [facility] Phone: [phone]; Fax: [phone]  This note was written with the assistance of speech recognition software. Please excuse any transcriptional errors.

## 2017-05-10 NOTE — Patient Instructions (Signed)
Medication Instructions:  Decrease: Lisinopril-Hydrochlorothiazide 20-25 mg 1 tablet by mouth daily   Labwork: none  Testing/Procedures: none  Follow-Up: Your physician recommends that you schedule a follow-up appointment in: Keep appointment with Dr. [name]   Any Other Special Instructions Will Be Listed Below (If Applicable).     If you need a refill on your cardiac medications before your next appointment, please call your pharmacy.

## 2017-05-10 NOTE — Telephone Encounter (Signed)
Any follow up on this? MCr

## 2017-05-11 ENCOUNTER — Other Ambulatory Visit: Payer: Self-pay | Admitting: Internal Medicine

## 2017-05-11 DIAGNOSIS — J181 Lobar pneumonia, unspecified organism: Secondary | ICD-10-CM | POA: Diagnosis not present

## 2017-05-11 DIAGNOSIS — J189 Pneumonia, unspecified organism: Secondary | ICD-10-CM

## 2017-05-11 DIAGNOSIS — I10 Essential (primary) hypertension: Secondary | ICD-10-CM | POA: Diagnosis not present

## 2017-05-11 DIAGNOSIS — I2510 Atherosclerotic heart disease of native coronary artery without angina pectoris: Secondary | ICD-10-CM | POA: Diagnosis not present

## 2017-05-11 DIAGNOSIS — G603 Idiopathic progressive neuropathy: Secondary | ICD-10-CM | POA: Diagnosis not present

## 2017-05-12 ENCOUNTER — Ambulatory Visit
Admission: RE | Admit: 2017-05-12 | Discharge: 2017-05-12 | Disposition: A | Discharge status: Home / Self Care | Payer: Medicare Other | Source: Ambulatory Visit | Attending: Internal Medicine | Admitting: Internal Medicine

## 2017-05-12 DIAGNOSIS — I712 Thoracic aortic aneurysm, without rupture: Secondary | ICD-10-CM | POA: Diagnosis not present

## 2017-05-12 DIAGNOSIS — J189 Pneumonia, unspecified organism: Secondary | ICD-10-CM

## 2017-05-14 ENCOUNTER — Telehealth: Payer: Self-pay | Admitting: Cardiovascular Disease

## 2017-05-14 NOTE — Telephone Encounter (Signed)
Follow Up:; ° ° °Returning your call. °

## 2017-05-14 NOTE — Telephone Encounter (Signed)
Informed [name] that I spoke with her sister [name] in regards to [name] [name] rx that was faxed to the [place] on Tuesday. She voiced understanding and stated she spoke with her sister and is in the loop and appreciates me helping her dad.

## 2017-05-27 ENCOUNTER — Other Ambulatory Visit: Payer: Self-pay | Admitting: *Deleted

## 2017-05-27 ENCOUNTER — Encounter: Payer: Self-pay | Admitting: *Deleted

## 2017-05-27 NOTE — Patient Outreach (Signed)
[facility]) Care Management  05/27/2017  Juleon Narang May 03, 1925 [id number]    RN attempted an outreach call however unsuccessful but able to leave a HIPAA approved voice message requesting a call back. Will further address pt's progress, update plan of care and inquire on any needed resources as pt continues to manage his care. Will reschedule another follow up call accordingly.  Raina Mina, RN Care Management Coordinator [place] Office [phone]

## 2017-06-02 ENCOUNTER — Other Ambulatory Visit: Payer: Self-pay | Admitting: *Deleted

## 2017-06-02 NOTE — Patient Outreach (Signed)
[facility]) Care Management  06/02/2017  Earsel Shouse 1925-05-06 [id number]   Covering for Raina Mina, Case Manager.   Telephone follow up call   Unsuccessful outreach call to patient contact, daughter Dara Hoyer, able to leave a HIPAA compliant message requesting a return call   Plan  Will update assigned care manager for next planned outreach.    Joylene Draft, RN, [place] Management Coordinator  [phone]- Mobile [phone]- Toll Free Main Office

## 2017-06-03 ENCOUNTER — Encounter: Payer: Self-pay | Admitting: Cardiothoracic Surgery

## 2017-06-11 ENCOUNTER — Telehealth: Payer: Self-pay | Admitting: Cardiovascular Disease

## 2017-06-11 NOTE — Telephone Encounter (Signed)
Per daugher's call [name] [name]:  Pt had a CT done this May pt has been diagnoses with an Aneurism and it has grown.   Per Dr Claybon Jabs.  Pt is being referred to a Cardiothorascic Surgeon Dr Dahlia Byes.   Pt's Daughter would like Dr [name] to go over the results of the CT and to confirm this pt is a good candidate for surgery.  Please give [name] [name] a call back ASAP.

## 2017-06-11 NOTE — Telephone Encounter (Signed)
Returned call to daughter she states that Dr C has already spoke with her about this.

## 2017-06-11 NOTE — Telephone Encounter (Signed)
I think surgery would be a risky intervention due to his poor gait and overall limited functional status, but it's worth getting a surgeon's opinion. I will also forward his images to Dr. Trula Slade, to see if he thinks a stent would an option. Either approach can be complicated by paraplegia.  [facility], do you mind taking a look at this gentleman's CT? Thank you!  MCr

## 2017-06-14 ENCOUNTER — Telehealth: Payer: Self-pay | Admitting: Cardiovascular Disease

## 2017-06-14 NOTE — Telephone Encounter (Signed)
Spoke with [name], she reports she talked to dr croitoru about the CT scan and the appointment set up with the surgeons. The family has discussed this and they feel that the appt with the surgeon would just cause anxiety for the father, esp since he is not a good candidate for any surgery. They wanted dr c to call and maybe discuss with dr Lucianne Lei tright. Aware dr croitoru is working in the hospital today but will forward to him. She reports she may cancel the appt for Wednesday this week with the surgeon.

## 2017-06-14 NOTE — Telephone Encounter (Signed)
Pt's daughter call    His 3 daughter think it wont be mentally for pt go and see Dr Prescott Gum and want Dr C to call Dr Prescott Gum and consult with him concerning pt. Please advise

## 2017-06-14 NOTE — Telephone Encounter (Signed)
Understood. I will speak to Dr. Prescott Gum first chance I get. MCr

## 2017-06-14 NOTE — Telephone Encounter (Signed)
Daughter made aware 

## 2017-06-16 ENCOUNTER — Encounter: Payer: Medicare Other | Admitting: Cardiothoracic Surgery

## 2017-06-22 DIAGNOSIS — I10 Essential (primary) hypertension: Secondary | ICD-10-CM | POA: Diagnosis not present

## 2017-06-22 DIAGNOSIS — E785 Hyperlipidemia, unspecified: Secondary | ICD-10-CM | POA: Diagnosis not present

## 2017-06-22 DIAGNOSIS — L819 Disorder of pigmentation, unspecified: Secondary | ICD-10-CM | POA: Diagnosis not present

## 2017-06-25 ENCOUNTER — Other Ambulatory Visit (HOSPITAL_COMMUNITY): Payer: Self-pay | Admitting: Internal Medicine

## 2017-06-25 DIAGNOSIS — R1310 Dysphagia, unspecified: Secondary | ICD-10-CM

## 2017-06-25 DIAGNOSIS — D225 Melanocytic nevi of trunk: Secondary | ICD-10-CM | POA: Diagnosis not present

## 2017-06-25 DIAGNOSIS — C44319 Basal cell carcinoma of skin of other parts of face: Secondary | ICD-10-CM | POA: Diagnosis not present

## 2017-06-25 DIAGNOSIS — L821 Other seborrheic keratosis: Secondary | ICD-10-CM | POA: Diagnosis not present

## 2017-06-25 DIAGNOSIS — C44519 Basal cell carcinoma of skin of other part of trunk: Secondary | ICD-10-CM | POA: Diagnosis not present

## 2017-06-25 DIAGNOSIS — D485 Neoplasm of uncertain behavior of skin: Secondary | ICD-10-CM | POA: Diagnosis not present

## 2017-07-01 ENCOUNTER — Ambulatory Visit (HOSPITAL_COMMUNITY)
Admission: RE | Admit: 2017-07-01 | Discharge: 2017-07-01 | Disposition: A | Discharge status: Home / Self Care | Payer: Medicare Other | Source: Ambulatory Visit | Attending: Internal Medicine | Admitting: Internal Medicine

## 2017-07-01 DIAGNOSIS — M1990 Unspecified osteoarthritis, unspecified site: Secondary | ICD-10-CM | POA: Insufficient documentation

## 2017-07-01 DIAGNOSIS — Z85828 Personal history of other malignant neoplasm of skin: Secondary | ICD-10-CM | POA: Diagnosis not present

## 2017-07-01 DIAGNOSIS — K219 Gastro-esophageal reflux disease without esophagitis: Secondary | ICD-10-CM | POA: Diagnosis not present

## 2017-07-01 DIAGNOSIS — R1310 Dysphagia, unspecified: Secondary | ICD-10-CM | POA: Diagnosis present

## 2017-07-01 DIAGNOSIS — E0590 Thyrotoxicosis, unspecified without thyrotoxic crisis or storm: Secondary | ICD-10-CM | POA: Insufficient documentation

## 2017-07-01 DIAGNOSIS — F4310 Post-traumatic stress disorder, unspecified: Secondary | ICD-10-CM | POA: Diagnosis not present

## 2017-07-01 DIAGNOSIS — G629 Polyneuropathy, unspecified: Secondary | ICD-10-CM | POA: Diagnosis not present

## 2017-07-01 DIAGNOSIS — T17320A Food in larynx causing asphyxiation, initial encounter: Secondary | ICD-10-CM | POA: Diagnosis not present

## 2017-07-01 DIAGNOSIS — I2510 Atherosclerotic heart disease of native coronary artery without angina pectoris: Secondary | ICD-10-CM | POA: Diagnosis not present

## 2017-07-01 DIAGNOSIS — I10 Essential (primary) hypertension: Secondary | ICD-10-CM | POA: Insufficient documentation

## 2017-07-01 DIAGNOSIS — M810 Age-related osteoporosis without current pathological fracture: Secondary | ICD-10-CM | POA: Insufficient documentation

## 2017-07-01 DIAGNOSIS — R1312 Dysphagia, oropharyngeal phase: Secondary | ICD-10-CM | POA: Insufficient documentation

## 2017-07-01 DIAGNOSIS — E7800 Pure hypercholesterolemia, unspecified: Secondary | ICD-10-CM | POA: Insufficient documentation

## 2017-07-01 DIAGNOSIS — F319 Bipolar disorder, unspecified: Secondary | ICD-10-CM | POA: Insufficient documentation

## 2017-07-01 NOTE — Progress Notes (Signed)
Modified Barium Swallow Progress Note  Patient Details  Name: [name] [name] MRN: [id number] Date of Birth: 01-19-1925  Today's Date: 07/01/2017  Modified Barium Swallow completed.  Full report located under Chart Review in the Imaging Section.  Brief recommendations include the following:  Clinical Impression  Pt demonstrates a mild oropharyngeal dysphagia with mild to moderate base of tongue/vallecular residuals due to decreased epiglottic deflection, though no significant muscular weakness identified. Pt also consistently tilted head forward (not fully tucked) during all swallows, apparently following a prior instruction from SLP. pt consistently initaited a second swallow to clear residue with liquids. Trace silent aspiration did occur with consecutive straw sips- trace pyriform residue spilled into airway between swallows. Pt ejected with cues. Recommend pt consume solids of choice, following with sips of liquid to clear mild vallecular residue. Thin liquids recommended, no straws. Continue pills whole in puree.    Swallow Evaluation Recommendations       SLP Diet Recommendations: Regular solids;Thin liquid   Liquid Administration via: Cup;No straw   Medication Administration: Whole meds with puree   Supervision: Patient able to self feed   Compensations: Follow solids with liquid   Postural Changes: Remain semi-upright after after feeds/meals (Comment);Seated upright at 90 degrees   Oral Care Recommendations: Patient independent with oral care       [facility], [place] CCC-SLP [phone]  [name], Katherene Ponto 07/01/2017,1:28 PM

## 2017-07-05 DIAGNOSIS — D225 Melanocytic nevi of trunk: Secondary | ICD-10-CM | POA: Diagnosis not present

## 2017-07-05 DIAGNOSIS — C44519 Basal cell carcinoma of skin of other part of trunk: Secondary | ICD-10-CM | POA: Diagnosis not present

## 2017-07-05 DIAGNOSIS — C44319 Basal cell carcinoma of skin of other parts of face: Secondary | ICD-10-CM | POA: Diagnosis not present

## 2017-07-05 DIAGNOSIS — D485 Neoplasm of uncertain behavior of skin: Secondary | ICD-10-CM | POA: Diagnosis not present

## 2017-07-05 DIAGNOSIS — L821 Other seborrheic keratosis: Secondary | ICD-10-CM | POA: Diagnosis not present

## 2017-07-09 ENCOUNTER — Other Ambulatory Visit: Payer: Self-pay | Admitting: *Deleted

## 2017-07-09 ENCOUNTER — Emergency Department (HOSPITAL_COMMUNITY): Payer: Medicare Other

## 2017-07-09 ENCOUNTER — Encounter: Payer: Self-pay | Admitting: *Deleted

## 2017-07-09 ENCOUNTER — Inpatient Hospital Stay (HOSPITAL_COMMUNITY)
Admission: EM | Admit: 2017-07-09 | Discharge: 2017-07-16 | DRG: 190 | Disposition: A | Discharge status: Skilled Nursing Facility | Payer: Medicare Other | Attending: Internal Medicine | Admitting: Internal Medicine

## 2017-07-09 DIAGNOSIS — R339 Retention of urine, unspecified: Secondary | ICD-10-CM | POA: Diagnosis not present

## 2017-07-09 DIAGNOSIS — F329 Major depressive disorder, single episode, unspecified: Secondary | ICD-10-CM | POA: Diagnosis not present

## 2017-07-09 DIAGNOSIS — I25119 Atherosclerotic heart disease of native coronary artery with unspecified angina pectoris: Secondary | ICD-10-CM | POA: Diagnosis present

## 2017-07-09 DIAGNOSIS — I959 Hypotension, unspecified: Secondary | ICD-10-CM | POA: Diagnosis not present

## 2017-07-09 DIAGNOSIS — Z888 Allergy status to other drugs, medicaments and biological substances status: Secondary | ICD-10-CM

## 2017-07-09 DIAGNOSIS — B9789 Other viral agents as the cause of diseases classified elsewhere: Secondary | ICD-10-CM | POA: Diagnosis present

## 2017-07-09 DIAGNOSIS — N401 Enlarged prostate with lower urinary tract symptoms: Secondary | ICD-10-CM | POA: Diagnosis present

## 2017-07-09 DIAGNOSIS — J441 Chronic obstructive pulmonary disease with (acute) exacerbation: Secondary | ICD-10-CM | POA: Diagnosis not present

## 2017-07-09 DIAGNOSIS — H04129 Dry eye syndrome of unspecified lacrimal gland: Secondary | ICD-10-CM | POA: Diagnosis not present

## 2017-07-09 DIAGNOSIS — E559 Vitamin D deficiency, unspecified: Secondary | ICD-10-CM | POA: Diagnosis not present

## 2017-07-09 DIAGNOSIS — E7800 Pure hypercholesterolemia, unspecified: Secondary | ICD-10-CM | POA: Diagnosis present

## 2017-07-09 DIAGNOSIS — R0602 Shortness of breath: Secondary | ICD-10-CM

## 2017-07-09 DIAGNOSIS — I480 Paroxysmal atrial fibrillation: Secondary | ICD-10-CM | POA: Diagnosis not present

## 2017-07-09 DIAGNOSIS — J309 Allergic rhinitis, unspecified: Secondary | ICD-10-CM | POA: Diagnosis not present

## 2017-07-09 DIAGNOSIS — Z79899 Other long term (current) drug therapy: Secondary | ICD-10-CM

## 2017-07-09 DIAGNOSIS — E876 Hypokalemia: Secondary | ICD-10-CM | POA: Diagnosis present

## 2017-07-09 DIAGNOSIS — I110 Hypertensive heart disease with heart failure: Secondary | ICD-10-CM | POA: Diagnosis present

## 2017-07-09 DIAGNOSIS — Z955 Presence of coronary angioplasty implant and graft: Secondary | ICD-10-CM | POA: Diagnosis not present

## 2017-07-09 DIAGNOSIS — I4891 Unspecified atrial fibrillation: Secondary | ICD-10-CM | POA: Diagnosis not present

## 2017-07-09 DIAGNOSIS — Z87891 Personal history of nicotine dependence: Secondary | ICD-10-CM

## 2017-07-09 DIAGNOSIS — Z85828 Personal history of other malignant neoplasm of skin: Secondary | ICD-10-CM

## 2017-07-09 DIAGNOSIS — J9601 Acute respiratory failure with hypoxia: Secondary | ICD-10-CM | POA: Diagnosis present

## 2017-07-09 DIAGNOSIS — I5033 Acute on chronic diastolic (congestive) heart failure: Secondary | ICD-10-CM | POA: Diagnosis present

## 2017-07-09 DIAGNOSIS — J449 Chronic obstructive pulmonary disease, unspecified: Secondary | ICD-10-CM | POA: Diagnosis not present

## 2017-07-09 DIAGNOSIS — J440 Chronic obstructive pulmonary disease with acute lower respiratory infection: Secondary | ICD-10-CM | POA: Diagnosis present

## 2017-07-09 DIAGNOSIS — I272 Pulmonary hypertension, unspecified: Secondary | ICD-10-CM | POA: Diagnosis present

## 2017-07-09 DIAGNOSIS — F0390 Unspecified dementia without behavioral disturbance: Secondary | ICD-10-CM | POA: Diagnosis present

## 2017-07-09 DIAGNOSIS — R05 Cough: Secondary | ICD-10-CM | POA: Diagnosis not present

## 2017-07-09 DIAGNOSIS — J40 Bronchitis, not specified as acute or chronic: Secondary | ICD-10-CM

## 2017-07-09 DIAGNOSIS — R1310 Dysphagia, unspecified: Secondary | ICD-10-CM | POA: Diagnosis not present

## 2017-07-09 DIAGNOSIS — T380X5A Adverse effect of glucocorticoids and synthetic analogues, initial encounter: Secondary | ICD-10-CM | POA: Diagnosis not present

## 2017-07-09 DIAGNOSIS — I5032 Chronic diastolic (congestive) heart failure: Secondary | ICD-10-CM

## 2017-07-09 DIAGNOSIS — I351 Nonrheumatic aortic (valve) insufficiency: Secondary | ICD-10-CM | POA: Diagnosis not present

## 2017-07-09 DIAGNOSIS — M2550 Pain in unspecified joint: Secondary | ICD-10-CM | POA: Diagnosis not present

## 2017-07-09 DIAGNOSIS — R338 Other retention of urine: Secondary | ICD-10-CM | POA: Diagnosis present

## 2017-07-09 DIAGNOSIS — I714 Abdominal aortic aneurysm, without rupture, unspecified: Secondary | ICD-10-CM

## 2017-07-09 DIAGNOSIS — E039 Hypothyroidism, unspecified: Secondary | ICD-10-CM | POA: Diagnosis present

## 2017-07-09 DIAGNOSIS — G629 Polyneuropathy, unspecified: Secondary | ICD-10-CM | POA: Diagnosis present

## 2017-07-09 DIAGNOSIS — K219 Gastro-esophageal reflux disease without esophagitis: Secondary | ICD-10-CM | POA: Diagnosis present

## 2017-07-09 DIAGNOSIS — I509 Heart failure, unspecified: Secondary | ICD-10-CM | POA: Diagnosis not present

## 2017-07-09 DIAGNOSIS — I712 Thoracic aortic aneurysm, without rupture: Secondary | ICD-10-CM | POA: Diagnosis not present

## 2017-07-09 DIAGNOSIS — F4310 Post-traumatic stress disorder, unspecified: Secondary | ICD-10-CM | POA: Diagnosis present

## 2017-07-09 DIAGNOSIS — M810 Age-related osteoporosis without current pathological fracture: Secondary | ICD-10-CM | POA: Diagnosis present

## 2017-07-09 DIAGNOSIS — E538 Deficiency of other specified B group vitamins: Secondary | ICD-10-CM | POA: Diagnosis not present

## 2017-07-09 DIAGNOSIS — I2510 Atherosclerotic heart disease of native coronary artery without angina pectoris: Secondary | ICD-10-CM | POA: Diagnosis not present

## 2017-07-09 DIAGNOSIS — F05 Delirium due to known physiological condition: Secondary | ICD-10-CM | POA: Diagnosis not present

## 2017-07-09 DIAGNOSIS — I10 Essential (primary) hypertension: Secondary | ICD-10-CM | POA: Diagnosis present

## 2017-07-09 DIAGNOSIS — K5900 Constipation, unspecified: Secondary | ICD-10-CM | POA: Diagnosis not present

## 2017-07-09 DIAGNOSIS — H9110 Presbycusis, unspecified ear: Secondary | ICD-10-CM | POA: Diagnosis present

## 2017-07-09 DIAGNOSIS — Z825 Family history of asthma and other chronic lower respiratory diseases: Secondary | ICD-10-CM

## 2017-07-09 DIAGNOSIS — Z7901 Long term (current) use of anticoagulants: Secondary | ICD-10-CM | POA: Diagnosis not present

## 2017-07-09 DIAGNOSIS — Z7401 Bed confinement status: Secondary | ICD-10-CM | POA: Diagnosis not present

## 2017-07-09 DIAGNOSIS — L8990 Pressure ulcer of unspecified site, unspecified stage: Secondary | ICD-10-CM | POA: Diagnosis present

## 2017-07-09 DIAGNOSIS — J069 Acute upper respiratory infection, unspecified: Secondary | ICD-10-CM | POA: Diagnosis present

## 2017-07-09 DIAGNOSIS — I25118 Atherosclerotic heart disease of native coronary artery with other forms of angina pectoris: Secondary | ICD-10-CM | POA: Diagnosis not present

## 2017-07-09 DIAGNOSIS — Z66 Do not resuscitate: Secondary | ICD-10-CM | POA: Diagnosis present

## 2017-07-09 DIAGNOSIS — G4700 Insomnia, unspecified: Secondary | ICD-10-CM | POA: Diagnosis present

## 2017-07-09 DIAGNOSIS — I5031 Acute diastolic (congestive) heart failure: Secondary | ICD-10-CM | POA: Diagnosis not present

## 2017-07-09 DIAGNOSIS — F309 Manic episode, unspecified: Secondary | ICD-10-CM | POA: Diagnosis not present

## 2017-07-09 DIAGNOSIS — N400 Enlarged prostate without lower urinary tract symptoms: Secondary | ICD-10-CM | POA: Diagnosis present

## 2017-07-09 DIAGNOSIS — M6281 Muscle weakness (generalized): Secondary | ICD-10-CM | POA: Diagnosis not present

## 2017-07-09 DIAGNOSIS — G4709 Other insomnia: Secondary | ICD-10-CM | POA: Diagnosis not present

## 2017-07-09 DIAGNOSIS — M1990 Unspecified osteoarthritis, unspecified site: Secondary | ICD-10-CM | POA: Diagnosis not present

## 2017-07-09 LAB — CBC WITH DIFFERENTIAL/PLATELET
ABS IMMATURE GRANULOCYTES: 0 10*3/uL (ref 0.0–0.1)
Basophils Absolute: 0 10*3/uL (ref 0.0–0.1)
Basophils Relative: 0 %
EOS ABS: 0.3 10*3/uL (ref 0.0–0.7)
Eosinophils Relative: 4 %
HCT: 49.7 % (ref 39.0–52.0)
Hemoglobin: 16.1 g/dL (ref 13.0–17.0)
IMMATURE GRANULOCYTES: 0 %
Lymphocytes Relative: 10 %
Lymphs Abs: 0.9 10*3/uL (ref 0.7–4.0)
MCH: 30 pg (ref 26.0–34.0)
MCHC: 32.4 g/dL (ref 30.0–36.0)
MCV: 92.7 fL (ref 78.0–100.0)
Monocytes Absolute: 0.8 10*3/uL (ref 0.1–1.0)
Monocytes Relative: 9 %
NEUTROS ABS: 7.2 10*3/uL (ref 1.7–7.7)
NEUTROS PCT: 77 %
Platelets: 174 10*3/uL (ref 150–400)
RBC: 5.36 MIL/uL (ref 4.22–5.81)
RDW: 13.2 % (ref 11.5–15.5)
WBC: 9.3 10*3/uL (ref 4.0–10.5)

## 2017-07-09 LAB — COMPREHENSIVE METABOLIC PANEL
ALT: 10 U/L (ref 0–44)
ANION GAP: 9 (ref 5–15)
AST: 24 U/L (ref 15–41)
Albumin: 3.8 g/dL (ref 3.5–5.0)
Alkaline Phosphatase: 96 U/L (ref 38–126)
BUN: 15 mg/dL (ref 8–23)
CO2: 29 mmol/L (ref 22–32)
CREATININE: 1.03 mg/dL (ref 0.61–1.24)
Calcium: 9.7 mg/dL (ref 8.9–10.3)
Chloride: 103 mmol/L (ref 98–111)
Glucose, Bld: 112 mg/dL — ABNORMAL HIGH (ref 70–99)
POTASSIUM: 4.2 mmol/L (ref 3.5–5.1)
SODIUM: 141 mmol/L (ref 135–145)
Total Bilirubin: 1.3 mg/dL — ABNORMAL HIGH (ref 0.3–1.2)
Total Protein: 6.7 g/dL (ref 6.5–8.1)

## 2017-07-09 LAB — BRAIN NATRIURETIC PEPTIDE: B Natriuretic Peptide: 234.6 pg/mL — ABNORMAL HIGH (ref 0.0–100.0)

## 2017-07-09 LAB — URINALYSIS, ROUTINE W REFLEX MICROSCOPIC
Bilirubin Urine: NEGATIVE
Glucose, UA: NEGATIVE mg/dL
Hgb urine dipstick: NEGATIVE
KETONES UR: NEGATIVE mg/dL
LEUKOCYTES UA: NEGATIVE
NITRITE: NEGATIVE
PH: 6 (ref 5.0–8.0)
Protein, ur: NEGATIVE mg/dL
Specific Gravity, Urine: 1.014 (ref 1.005–1.030)

## 2017-07-09 LAB — TROPONIN I

## 2017-07-09 LAB — TSH: TSH: 0.457 u[IU]/mL (ref 0.350–4.500)

## 2017-07-09 LAB — I-STAT CG4 LACTIC ACID, ED
LACTIC ACID, VENOUS: 1.3 mmol/L (ref 0.5–1.9)
LACTIC ACID, VENOUS: 1.27 mmol/L (ref 0.5–1.9)

## 2017-07-09 MED ORDER — TRAZODONE HCL 50 MG PO TABS
50.0000 mg | ORAL_TABLET | Freq: Every day | ORAL | Status: DC
  Administered 2017-07-10 – 2017-07-15 (×7): 50 mg via ORAL
  Filled 2017-07-09 (×7): qty 1

## 2017-07-09 MED ORDER — LISINOPRIL 10 MG PO TABS
20.0000 mg | ORAL_TABLET | Freq: Every day | ORAL | Status: DC
  Administered 2017-07-10 – 2017-07-16 (×7): 20 mg via ORAL
  Filled 2017-07-09 (×7): qty 2

## 2017-07-09 MED ORDER — DILTIAZEM HCL-DEXTROSE 100-5 MG/100ML-% IV SOLN (PREMIX)
5.0000 mg/h | INTRAVENOUS | Status: DC
  Administered 2017-07-09: 5 mg/h via INTRAVENOUS
  Administered 2017-07-11 (×2): 15 mg/h via INTRAVENOUS
  Administered 2017-07-11: 7.5 mg/h via INTRAVENOUS
  Administered 2017-07-12 (×2): 15 mg/h via INTRAVENOUS
  Filled 2017-07-09 (×9): qty 100

## 2017-07-09 MED ORDER — SODIUM CHLORIDE 0.9 % IV SOLN
1.0000 g | Freq: Once | INTRAVENOUS | Status: AC
  Administered 2017-07-09: 1 g via INTRAVENOUS
  Filled 2017-07-09: qty 10

## 2017-07-09 MED ORDER — IPRATROPIUM-ALBUTEROL 0.5-2.5 (3) MG/3ML IN SOLN
3.0000 mL | Freq: Once | RESPIRATORY_TRACT | Status: AC
  Administered 2017-07-09: 3 mL via RESPIRATORY_TRACT
  Filled 2017-07-09: qty 3

## 2017-07-09 MED ORDER — LORATADINE 10 MG PO TABS
10.0000 mg | ORAL_TABLET | Freq: Every day | ORAL | Status: DC
  Administered 2017-07-10 – 2017-07-16 (×7): 10 mg via ORAL
  Filled 2017-07-09 (×7): qty 1

## 2017-07-09 MED ORDER — SODIUM CHLORIDE 0.9% FLUSH
3.0000 mL | Freq: Two times a day (BID) | INTRAVENOUS | Status: DC
  Administered 2017-07-09 – 2017-07-16 (×13): 3 mL via INTRAVENOUS

## 2017-07-09 MED ORDER — AZITHROMYCIN 250 MG PO TABS
500.0000 mg | ORAL_TABLET | Freq: Once | ORAL | Status: AC
  Administered 2017-07-09: 500 mg via ORAL
  Filled 2017-07-09: qty 2

## 2017-07-09 MED ORDER — PANTOPRAZOLE SODIUM 40 MG PO TBEC
40.0000 mg | DELAYED_RELEASE_TABLET | Freq: Every day | ORAL | Status: DC
  Administered 2017-07-10 – 2017-07-16 (×7): 40 mg via ORAL
  Filled 2017-07-09 (×7): qty 1

## 2017-07-09 MED ORDER — TIOTROPIUM BROMIDE MONOHYDRATE 18 MCG IN CAPS
18.0000 ug | ORAL_CAPSULE | Freq: Every day | RESPIRATORY_TRACT | Status: DC
  Administered 2017-07-11 – 2017-07-15 (×4): 18 ug via RESPIRATORY_TRACT
  Filled 2017-07-09: qty 5

## 2017-07-09 MED ORDER — APIXABAN 5 MG PO TABS
5.0000 mg | ORAL_TABLET | Freq: Two times a day (BID) | ORAL | Status: DC
  Administered 2017-07-10 – 2017-07-16 (×14): 5 mg via ORAL
  Filled 2017-07-09 (×15): qty 1

## 2017-07-09 MED ORDER — ISOSORBIDE MONONITRATE ER 30 MG PO TB24
30.0000 mg | ORAL_TABLET | Freq: Every day | ORAL | Status: DC
  Administered 2017-07-10 – 2017-07-16 (×7): 30 mg via ORAL
  Filled 2017-07-09 (×7): qty 1

## 2017-07-09 MED ORDER — METOPROLOL TARTRATE 25 MG PO TABS: 100.0000 mg | ORAL_TABLET | Freq: Two times a day (BID) | ORAL | Status: DC

## 2017-07-09 MED ORDER — METHYLPREDNISOLONE SODIUM SUCC 125 MG IJ SOLR
125.0000 mg | Freq: Once | INTRAMUSCULAR | Status: AC
  Administered 2017-07-09: 125 mg via INTRAVENOUS
  Filled 2017-07-09: qty 2

## 2017-07-09 MED ORDER — FUROSEMIDE 10 MG/ML IJ SOLN
20.0000 mg | Freq: Once | INTRAMUSCULAR | Status: AC
  Administered 2017-07-10: 20 mg via INTRAVENOUS

## 2017-07-09 MED ORDER — SODIUM CHLORIDE 0.9% FLUSH: 3.0000 mL | INTRAVENOUS | Status: DC | PRN

## 2017-07-09 MED ORDER — ALBUTEROL (5 MG/ML) CONTINUOUS INHALATION SOLN
10.0000 mg/h | INHALATION_SOLUTION | RESPIRATORY_TRACT | Status: DC
  Administered 2017-07-09: 10 mg/h via RESPIRATORY_TRACT
  Filled 2017-07-09: qty 20

## 2017-07-09 MED ORDER — LISINOPRIL-HYDROCHLOROTHIAZIDE 20-25 MG PO TABS: 1.0000 | ORAL_TABLET | Freq: Every day | ORAL | Status: DC

## 2017-07-09 MED ORDER — FINASTERIDE 5 MG PO TABS
5.0000 mg | ORAL_TABLET | Freq: Every day | ORAL | Status: DC
  Administered 2017-07-10 – 2017-07-16 (×7): 5 mg via ORAL
  Filled 2017-07-09 (×7): qty 1

## 2017-07-09 MED ORDER — MELATONIN 3 MG PO TABS
3.0000 mg | ORAL_TABLET | Freq: Every evening | ORAL | Status: DC
  Administered 2017-07-10 – 2017-07-13 (×5): 3 mg via ORAL
  Filled 2017-07-09 (×6): qty 1

## 2017-07-09 MED ORDER — PREDNISONE 20 MG PO TABS
40.0000 mg | ORAL_TABLET | Freq: Every day | ORAL | Status: DC
  Administered 2017-07-10 – 2017-07-12 (×3): 40 mg via ORAL
  Filled 2017-07-09 (×3): qty 2

## 2017-07-09 MED ORDER — DILTIAZEM LOAD VIA INFUSION
20.0000 mg | Freq: Once | INTRAVENOUS | Status: AC
  Administered 2017-07-09: 20 mg via INTRAVENOUS
  Filled 2017-07-09: qty 20

## 2017-07-09 MED ORDER — TAMSULOSIN HCL 0.4 MG PO CAPS
0.4000 mg | ORAL_CAPSULE | Freq: Every day | ORAL | Status: DC
  Administered 2017-07-10 – 2017-07-15 (×6): 0.4 mg via ORAL
  Filled 2017-07-09 (×6): qty 1

## 2017-07-09 MED ORDER — IPRATROPIUM-ALBUTEROL 0.5-2.5 (3) MG/3ML IN SOLN
3.0000 mL | Freq: Four times a day (QID) | RESPIRATORY_TRACT | Status: DC
  Administered 2017-07-10 (×3): 3 mL via RESPIRATORY_TRACT
  Filled 2017-07-09 (×3): qty 3

## 2017-07-09 MED ORDER — AMOXICILLIN-POT CLAVULANATE 875-125 MG PO TABS
1.0000 | ORAL_TABLET | Freq: Two times a day (BID) | ORAL | Status: DC
  Administered 2017-07-10 (×2): 1 via ORAL
  Filled 2017-07-09 (×2): qty 1

## 2017-07-09 MED ORDER — SODIUM CHLORIDE 0.9 % IV SOLN: 250.0000 mL | INTRAVENOUS | Status: DC | PRN

## 2017-07-09 MED ORDER — FUROSEMIDE 10 MG/ML IJ SOLN
20.0000 mg | Freq: Every day | INTRAMUSCULAR | Status: DC
  Administered 2017-07-10 – 2017-07-12 (×3): 20 mg via INTRAVENOUS
  Filled 2017-07-09 (×4): qty 2

## 2017-07-09 MED ORDER — HYDROCHLOROTHIAZIDE 25 MG PO TABS
25.0000 mg | ORAL_TABLET | Freq: Every day | ORAL | Status: DC
  Administered 2017-07-10 – 2017-07-14 (×5): 25 mg via ORAL
  Filled 2017-07-09 (×5): qty 1

## 2017-07-09 MED ORDER — ATORVASTATIN CALCIUM 10 MG PO TABS
10.0000 mg | ORAL_TABLET | Freq: Every day | ORAL | Status: DC
  Administered 2017-07-10 – 2017-07-16 (×7): 10 mg via ORAL
  Filled 2017-07-09 (×7): qty 1

## 2017-07-09 MED ORDER — ENOXAPARIN SODIUM 30 MG/0.3ML ~~LOC~~ SOLN: 30.0000 mg | SUBCUTANEOUS | Status: DC

## 2017-07-09 NOTE — ED Notes (Signed)
MD Wouk notified of pt bp trending down into 38'G systolic while on Cardizem drip. Pt mentating well. Will continue to monitor.

## 2017-07-09 NOTE — ED Notes (Signed)
Report given to [facility]

## 2017-07-09 NOTE — ED Provider Notes (Signed)
[facility] EMERGENCY DEPARTMENT Provider Note   CSN: [id number] Arrival date & time: 07/09/17  1804     History   Chief Complaint Chief Complaint  Patient presents with  . Shortness of Breath    HPI [name] [name] is a 84 y.o. male.  Pt presents to the ED today with sob.  The pt said it has been going on for 3 days.  The pt has not had a fever.  Pt's family said he had a big 89th birthday party last weekend.  People from all over came to see him.  He developed a cold after the party and it has progressively gotten worse.  Today, he developed a bad cough.  This morning, he did not seem too bad, so he went [facility] the enrichment center.  When the daughter picked him up, he was very sob.  He does have a.fib and is on Eliquis.  CHA2DS2/VAS Stroke Risk Points  Current as of 10 minutes ago     5 >= 2 Points: High Risk  1 - 1.99 Points: Medium Risk  0 Points: Low Risk    The patient's score has not changed in the past year.:  No Change     Details    This score determines the patient's risk of having a stroke if the  patient has atrial fibrillation.       Points Metrics  1 Has Congestive Heart Failure:  Yes    Current as of 10 minutes ago  1 Has Vascular Disease:  Yes    Current as of 10 minutes ago  1 Has Hypertension:  Yes    Current as of 10 minutes ago  2 Age:  82    Current as of 10 minutes ago  0 Has Diabetes:  No    Current as of 10 minutes ago  0 Had Stroke:  No  Had TIA:  No  Had thromboembolism:  No    Current as of 10 minutes ago  0 Male:  No    Current as of 10 minutes ago       Pt did have pna and the flu in February and became very sick very quickly.         Past Medical History:  Diagnosis Date  . Anginal pain ([place])   . Arthritis    "all over"  . Coronary artery disease   . Depression ~ 42; ~ 1990   PTSD/Mania   . GERD (gastroesophageal reflux disease)   . High cholesterol   . Hypertension   . Hyperthyroidism   . Neuropathy   .  Osteoporosis   . Skin cancer of nose     Patient Active Problem List   Diagnosis Date Noted  . Acute on chronic diastolic congestive heart failure ([place]) 04/11/2017  . New onset atrial fibrillation ([place]) 04/11/2017  . Acute respiratory failure with hypoxia ([place]) 02/23/2017  . CAP (community acquired pneumonia) 02/23/2017  . Nausea & vomiting 02/23/2017  . PTSD (post-traumatic stress disorder)   . Vomiting and diarrhea 11/09/2014  . Unstable angina ([place]) 05/10/2014  . Coronary artery disease due to lipid rich plaque   . Angina pectoris, crescendo ([place]) 04/28/2014  . Contusion of left hip 07/04/2013  . Fall 07/04/2013  . BPH (benign prostatic hypertrophy) 05/31/2013  . Coronary artery disease of native artery of native heart with stable angina pectoris ([place]) 05/31/2013  . UTI (urinary tract infection) 05/31/2013  . Hypercholesterolemia 05/31/2013  . Dysphagia, pharyngoesophageal phase  04/10/2013  . Frequent falls 04/10/2013  . Weakness 04/10/2013  . Hyperthyroidism, subclinical 04/10/2013  . GERD (gastroesophageal reflux disease) 04/06/2013  . Bladder outflow obstruction 04/06/2013  . Dehydration 04/05/2013  . Essential hypertension 04/05/2013  . Arthritis 04/05/2013  . Peripheral neuropathy 04/05/2013  . Gastroenteritis, acute 04/05/2013  . Gastroenteritis 04/05/2013    Past Surgical History:  Procedure Laterality Date  . BACK SURGERY    . CARDIAC CATHETERIZATION  09/2007  . CARDIAC CATHETERIZATION N/A 05/08/2014   Procedure: CORONARY STENT INTERVENTION;  Surgeon: [name], MD; 3.518 mm Xience Alpine DES to the RCA   . LEFT HEART CATHETERIZATION WITH CORONARY ANGIOGRAM N/A 05/08/2014   Procedure: LEFT HEART CATHETERIZATION WITH CORONARY ANGIOGRAM;  Surgeon: [name], MD; LAD 100% (old), CFX 20%, OM1 30%, pRCA 30%, dRCA 95%, EF 55%     . LUMBAR LAMINECTOMY/DECOMPRESSION MICRODISCECTOMY  2011        Home Medications    Prior to Admission medications     Medication Sig Start Date End Date Taking? Authorizing Provider  acetaminophen (TYLENOL) 500 MG tablet Take 500-1,000 mg by mouth every 6 (six) hours as needed for mild pain (pain).    Yes [provider]  amLODipine (NORVASC) 10 MG tablet Take 5 mg by mouth daily.   Yes [provider]  apixaban (ELIQUIS) 5 MG TABS tablet Take 1 tablet (5 mg total) by mouth 2 (two) times daily. 04/20/17  Yes Croitoru, Mihai, MD  atorvastatin (LIPITOR) 10 MG tablet Take 1 tablet (10 mg total) by mouth daily. 02/28/17   Roxan Hockey, MD  calcium-vitamin D (OSCAL WITH D) 500-200 MG-UNIT per tablet Take 1 tablet by mouth 2 (two) times daily.     [provider]  Carboxymethylcellulose Sod PF 1 % GEL Place 1 drop into both eyes at bedtime.    [provider]  Cyanocobalamin (B-12 COMPLIANCE INJECTION) 1000 MCG/ML KIT Inject 1,000 mcg as directed every 30 (thirty) days. Patient takes the first Sunday each month    [provider]  finasteride (PROSCAR) 5 MG tablet Take 5 mg by mouth daily.    [provider]  ipratropium-albuterol (DUONEB) 0.5-2.5 (3) MG/3ML SOLN Take 3 mLs by nebulization 3 (three) times daily. Patient taking differently: Take 3 mLs by nebulization 2 (two) times daily.  02/27/17   Roxan Hockey, MD  isosorbide mononitrate (IMDUR) 30 MG 24 hr tablet Take 30 mg by mouth daily.    [provider]  lisinopril-hydrochlorothiazide (PRINZIDE,ZESTORETIC) 20-25 MG tablet Take 1 tablet by mouth daily. 05/10/17   Barrett, Evelene Croon, PA-C  lisinopril-hydrochlorothiazide (PRINZIDE,ZESTORETIC) 20-25 MG tablet Take 1 tablet by mouth daily. 05/10/17   Barrett, Evelene Croon, PA-C  loratadine (CLARITIN) 10 MG tablet Take 1 tablet (10 mg total) by mouth daily. 02/28/17   Roxan Hockey, MD  Melatonin 3 MG TABS Take 3 mg by mouth every evening.     [provider]  metoprolol (LOPRESSOR) 100 MG tablet Take 100 mg by mouth 2 (two) times daily.     [provider]  Multiple Vitamins-Minerals (PRESERVISION AREDS 2+MULTI VIT) CAPS Take 2 capsules by mouth daily.    [provider]  Omega-3 Fatty Acids (FISH OIL) 1200 MG CAPS Take 1 capsule by mouth daily.    [provider]  pantoprazole (PROTONIX) 40 MG tablet Take 1 tablet (40 mg total) by mouth daily. 05/11/14   Barrett, Evelene Croon, PA-C  tamsulosin (FLOMAX) 0.4 MG CAPS capsule Take 1 capsule (0.4 mg total)  by mouth daily after supper. 12/02/15   Barrett, Evelene Croon, PA-C  tiotropium (SPIRIVA HANDIHALER) 18 MCG inhalation capsule Place 1 capsule (18 mcg total) into inhaler and inhale daily. 02/27/17 02/27/18  Roxan Hockey, MD  traZODone (DESYREL) 50 MG tablet Take 50 mg by mouth at bedtime.     [provider]    Family History Family History  Problem Relation Age of Onset  . CAD Mother   . Asthma Mother     Social History Social History   Tobacco Use  . Smoking status: Former Smoker    Packs/day: 1.00    Years: 20.00    Pack years: 20.00    Types: Cigarettes    Last attempt to quit: 01/12/1962    Years since quitting: 55.5  . Smokeless tobacco: Never Used  Substance Use Topics  . Alcohol use: No    Alcohol/week: 0.0 oz    Comment: 05/08/2014 "he's been in AA for ~!35 yrs"  . Drug use: No     Allergies   Cortisone   Review of Systems Review of Systems  Respiratory: Positive for cough, shortness of breath and wheezing.   All other systems reviewed and are negative.    Physical Exam Updated Vital Signs BP 113/70   Pulse (!) 124   Temp 99 F (37.2 C)   Resp (!) 24   SpO2 95%   Physical Exam  Constitutional: He appears well-developed and well-nourished. He appears distressed.  HENT:  Head: Normocephalic and atraumatic.  Mouth/Throat: Oropharynx is clear and moist.  Eyes: Pupils are equal, round, and reactive to light. EOM are normal.  Neck: Normal range of motion. Neck supple.  Cardiovascular: An irregularly irregular  rhythm present. Tachycardia present.  Pulmonary/Chest: Tachypnea noted. He is in respiratory distress. He has wheezes.  Abdominal: Soft. Bowel sounds are normal.  Musculoskeletal: Normal range of motion.       Right lower leg: He exhibits edema.       Left lower leg: He exhibits edema.  Neurological: He is alert.  Skin: Skin is warm. Capillary refill takes less than 2 seconds.  Psychiatric: He has a normal mood and affect. His behavior is normal.  Nursing note and vitals reviewed.    ED Treatments / Results  Labs (all labs ordered are listed, but only abnormal results are displayed) Labs Reviewed  COMPREHENSIVE METABOLIC PANEL - Abnormal; Notable for the following components:      Result Value   Glucose, Bld 112 (*)    Total Bilirubin 1.3 (*)    All other components within normal limits  BRAIN NATRIURETIC PEPTIDE - Abnormal; Notable for the following components:   B Natriuretic Peptide 234.6 (*)    All other components within normal limits  CULTURE, BLOOD (ROUTINE X 2)  CULTURE, BLOOD (ROUTINE X 2)  RESPIRATORY PANEL BY PCR  CBC WITH DIFFERENTIAL/PLATELET  URINALYSIS, ROUTINE W REFLEX MICROSCOPIC  TSH  TROPONIN I  I-STAT CG4 LACTIC ACID, ED  I-STAT CG4 LACTIC ACID, ED    EKG EKG Interpretation  Date/Time:  Friday July 09 2017 18:15:44 EDT Ventricular Rate:  117 PR Interval:    QRS Duration: 112 QT Interval:  364 QTC Calculation: 507 R Axis:   47 Text Interpretation:  Atrial fibrillation with rapid ventricular response Right bundle branch block Abnormal ECG Confirmed by Isla Pence [phone]) on 07/09/2017 6:33:25 PM   Radiology Dg Chest Portable 1 View  Result Date: 07/09/2017 CLINICAL DATA:  Shortness of breath for several days EXAM: PORTABLE  CHEST 1 VIEW COMPARISON:  05/04/2017 FINDINGS: Cardiac shadow is enlarged but stable. Aortic calcifications are again seen. The lungs are well aerated bilaterally with mild interstitial changes of a chronic nature. No focal  infiltrate or sizable effusion is seen. No acute bony abnormality is noted. IMPRESSION: Chronic changes without acute abnormality. Electronically Signed   By: Inez Catalina M.D.   On: 07/09/2017 18:53    Procedures Procedures (including critical care time)  Medications Ordered in ED Medications  diltiazem (CARDIZEM) 1 mg/mL load via infusion 20 mg (20 mg Intravenous Bolus from Bag 07/09/17 1917)    And  diltiazem (CARDIZEM) 100 mg in dextrose 5% 124m (1 mg/mL) infusion (10 mg/hr Intravenous Rate/Dose Change 07/09/17 2050)  albuterol (PROVENTIL,VENTOLIN) solution continuous neb (0 mg/hr Nebulization Stopped 07/09/17 2054)  ipratropium-albuterol (DUONEB) 0.5-2.5 (3) MG/3ML nebulizer solution 3 mL (3 mLs Nebulization Given 07/09/17 1917)  methylPREDNISolone sodium succinate (SOLU-MEDROL) 125 mg/2 mL injection 125 mg (125 mg Intravenous Given 07/09/17 2051)  cefTRIAXone (ROCEPHIN) 1 g in sodium chloride 0.9 % 100 mL IVPB (1 g Intravenous New Bag/Given 07/09/17 2054)  azithromycin (ZITHROMAX) tablet 500 mg (500 mg Oral Given 07/09/17 2051)     Initial Impression / Assessment and Plan / ED Course  I have reviewed the triage vital signs and the nursing notes.  Pertinent labs & imaging results that were available during my care of the patient were reviewed by me and considered in my medical decision making (see chart for details).    CRITICAL CARE Performed by: JIsla Pence  Total critical care time: 30 minutes  Critical care time was exclusive of separately billable procedures and treating other patients.  Critical care was necessary to treat or prevent imminent or life-threatening deterioration.  Critical care was time spent personally by me on the following activities: development of treatment plan with patient and/or surrogate as well as nursing, discussions with consultants, evaluation of patient's response to treatment, examination of patient, obtaining history from patient or surrogate,  ordering and performing treatments and interventions, ordering and review of laboratory studies, ordering and review of radiographic studies, pulse oximetry and re-evaluation of patient's condition.  A.fib rate is better with cardizem.  Wheezing has improved with nebs.  Sx of bronchitis are progressing quickly in this elderly gentleman, so I will start him on rocephin and zithromax.  O2 sat drops to upper 80s when off oxygen.  Pt d/w Dr. WSi Raider([facility]) for admission.  Final Clinical Impressions(s) / ED Diagnoses   Final diagnoses:  Atrial fibrillation with rapid ventricular response ([place]  Bronchitis  Acute respiratory failure with hypoxia ([facility]    ED Discharge Orders    None       HIsla Pence MD 07/09/17 2132

## 2017-07-09 NOTE — H&P (Addendum)
History and Physical    [name] VZD:[id number] DOB: [birth year] DOA: 07/09/2017  PCP: [name] Seashore, MD  Patient coming from: home   Chief Complaint: dyspnea  HPI: [name] [name] is a 84 y.o. male with medical history significant for htn, cad (DES to LAD 2016), presbycusis, hyperthyroid, atrial fibrillation (on eliquis), possible MCI/dementia, descending AAA, lives at home with significant family support, who presents for above.  Hx obtained from patient and daughter [name] [name].  Extended hospital stay earlier this year for cap/flu. Discharged home on daily anticholinergic inhalers. Denies history asthma/copd, is a former smoker. Denies history diagnosed copd or asthma. Denies history heart failure. Compliant with medications per daughter.   Notes for the past 3 days slowly worsening shortness of breath and dyspnea on exertion. No chest pain. Some cough, at times productive. No fevers, no hemoptysis. Denies calf pain or swelling. No hx PE. Denies palpitations. Denies sick contacts. Went to urgent care today, who referred to ED. Not on oxygen at home. Feels somewhat better after duoneb. Baseline weight is 205 per daughter. Denies orthopnea.  ED Course: labs, CXR, antibiotics, duonebs,   Review of Systems: As per HPI otherwise 10 point review of systems negative.    Past Medical History:  Diagnosis Date  . Anginal pain ([place])   . Arthritis    "all over"  . Coronary artery disease   . Depression ~ 59; ~ 1990   PTSD/Mania   . GERD (gastroesophageal reflux disease)   . High cholesterol   . Hypertension   . Hyperthyroidism   . Neuropathy   . Osteoporosis   . Skin cancer of nose     Past Surgical History:  Procedure Laterality Date  . BACK SURGERY    . CARDIAC CATHETERIZATION  09/2007  . CARDIAC CATHETERIZATION N/A 05/08/2014   Procedure: CORONARY STENT INTERVENTION;  Surgeon: [name], MD; 3.518 mm Xience Alpine DES to the RCA   . LEFT HEART CATHETERIZATION WITH CORONARY  ANGIOGRAM N/A 05/08/2014   Procedure: LEFT HEART CATHETERIZATION WITH CORONARY ANGIOGRAM;  Surgeon: [name], MD; LAD 100% (old), CFX 20%, OM1 30%, pRCA 30%, dRCA 95%, EF 55%     . LUMBAR LAMINECTOMY/DECOMPRESSION MICRODISCECTOMY  2011     reports that he quit smoking about 55 years ago. His smoking use included cigarettes. He has a 20.00 pack-year smoking history. He has never used smokeless tobacco. He reports that he does not drink alcohol or use drugs.  Allergies  Allergen Reactions  . Cortisone Other (See Comments)    Causes emotional problems     Family History  Problem Relation Age of Onset  . CAD Mother   . Asthma Mother     Prior to Admission medications   Medication Sig Start Date End Date Taking? Authorizing Provider  acetaminophen (TYLENOL) 500 MG tablet Take 500-1,000 mg by mouth every 6 (six) hours as needed for mild pain (pain).    Yes [provider]  amLODipine (NORVASC) 10 MG tablet Take 5 mg by mouth daily.   Yes [provider]  apixaban (ELIQUIS) 5 MG TABS tablet Take 1 tablet (5 mg total) by mouth 2 (two) times daily. 04/20/17  Yes [name], Mihai, MD  atorvastatin (LIPITOR) 10 MG tablet Take 1 tablet (10 mg total) by mouth daily. 02/28/17   [name] Hockey, MD  calcium-vitamin D (OSCAL WITH D) 500-200 MG-UNIT per tablet Take 1 tablet by mouth 2 (two) times daily.     [provider]  Carboxymethylcellulose Sod PF 1 %  GEL Place 1 drop into both eyes at bedtime.    [provider]  Cyanocobalamin (B-12 COMPLIANCE INJECTION) 1000 MCG/ML KIT Inject 1,000 mcg as directed every 30 (thirty) days. Patient takes the first Sunday each month    [provider]  finasteride (PROSCAR) 5 MG tablet Take 5 mg by mouth daily.    [provider]  ipratropium-albuterol (DUONEB) 0.5-2.5 (3) MG/3ML SOLN Take 3 mLs by nebulization 3 (three) times daily. Patient taking differently: Take 3 mLs by nebulization 2 (two) times daily.   02/27/17   [name] Hockey, MD  isosorbide mononitrate (IMDUR) 30 MG 24 hr tablet Take 30 mg by mouth daily.    [provider]  lisinopril-hydrochlorothiazide (PRINZIDE,ZESTORETIC) 20-25 MG tablet Take 1 tablet by mouth daily. 05/10/17   [name], [name] Croon, PA-C  lisinopril-hydrochlorothiazide (PRINZIDE,ZESTORETIC) 20-25 MG tablet Take 1 tablet by mouth daily. 05/10/17   [name], [name] Croon, PA-C  loratadine (CLARITIN) 10 MG tablet Take 1 tablet (10 mg total) by mouth daily. 02/28/17   [name] Hockey, MD  Melatonin 3 MG TABS Take 3 mg by mouth every evening.     [provider]  metoprolol (LOPRESSOR) 100 MG tablet Take 100 mg by mouth 2 (two) times daily.    [provider]  Multiple Vitamins-Minerals (PRESERVISION AREDS 2+MULTI VIT) CAPS Take 2 capsules by mouth daily.    [provider]  Omega-3 Fatty Acids (FISH OIL) 1200 MG CAPS Take 1 capsule by mouth daily.    [provider]  pantoprazole (PROTONIX) 40 MG tablet Take 1 tablet (40 mg total) by mouth daily. 05/11/14   [name], [name] Croon, PA-C  tamsulosin (FLOMAX) 0.4 MG CAPS capsule Take 1 capsule (0.4 mg total) by mouth daily after supper. 12/02/15   [name], [name] Croon, PA-C  tiotropium (SPIRIVA HANDIHALER) 18 MCG inhalation capsule Place 1 capsule (18 mcg total) into inhaler and inhale daily. 02/27/17 02/27/18  [name] Hockey, MD  traZODone (DESYREL) 50 MG tablet Take 50 mg by mouth at bedtime.     [provider]    Physical Exam: Vitals:   07/09/17 2000 07/09/17 2015 07/09/17 2030 07/09/17 2045  BP: 137/79 128/83 128/71 113/70  Pulse: 84 (!) 49 (!) 116 (!) 124  Resp: (!) 26 (!) 27 (!) 26 (!) 24  Temp:      SpO2: 95% 93% 97% 95%    Constitutional: No acute distress. Hard of hearing. Head: Atraumatic Eyes: Conjunctiva clear ENM: Moist mucous membranes. poordentition.  Neck: Supple Respiratory: mild tachypnea, expiratory wheezes, scattered rhonchi . Cardiovascular: tachycardic,  irregularly irregular Abdomen: Non-tender, non-distended. No masses. No rebound or guarding. Positive bowel sounds. Musculoskeletal: No joint deformity upper and lower extremities. Normal ROM, no contractures. Normal muscle tone.  Skin: No rashes, lesions, or ulcers.  Extremities: pitting edema moderate to knees Neurologic: Alert, moving all 4 extremities. Psychiatric: Normal insight and judgement.   Labs on Admission: I have personally reviewed following labs and imaging studies  CBC: Recent Labs  Lab 07/09/17 1821  WBC 9.3  NEUTROABS 7.2  HGB 16.1  HCT 49.7  MCV 92.7  PLT 496   Basic Metabolic Panel: Recent Labs  Lab 07/09/17 1821  NA 141  K 4.2  CL 103  CO2 29  GLUCOSE 112*  BUN 15  CREATININE 1.03  CALCIUM 9.7   GFR: CrCl cannot be calculated (Unknown ideal weight.). Liver Function Tests: Recent Labs  Lab 07/09/17 1821  AST 24  ALT 10  ALKPHOS 96  BILITOT 1.3*  PROT 6.7  ALBUMIN 3.8   No results for input(s): LIPASE, AMYLASE in the last 168 hours. No results for input(s): AMMONIA in the last 168 hours. Coagulation Profile: No results for input(s): INR, PROTIME in the last 168 hours. Cardiac Enzymes: No results for input(s): CKTOTAL, CKMB, CKMBINDEX, TROPONINI in the last 168 hours. BNP (last 3 results) No results for input(s): PROBNP in the last 8760 hours. HbA1C: No results for input(s): HGBA1C in the last 72 hours. CBG: No results for input(s): GLUCAP in the last 168 hours. Lipid Profile: No results for input(s): CHOL, HDL, LDLCALC, TRIG, CHOLHDL, LDLDIRECT in the last 72 hours. Thyroid Function Tests: No results for input(s): TSH, T4TOTAL, FREET4, T3FREE, THYROIDAB in the last 72 hours. Anemia Panel: No results for input(s): VITAMINB12, FOLATE, FERRITIN, TIBC, IRON, RETICCTPCT in the last 72 hours. Urine analysis:    Component Value Date/Time   COLORURINE YELLOW 07/09/2017 1900   APPEARANCEUR CLEAR 07/09/2017 1900   LABSPEC 1.014  07/09/2017 1900   PHURINE 6.0 07/09/2017 1900   GLUCOSEU NEGATIVE 07/09/2017 1900   HGBUR NEGATIVE 07/09/2017 1900   BILIRUBINUR NEGATIVE 07/09/2017 1900   KETONESUR NEGATIVE 07/09/2017 1900   PROTEINUR NEGATIVE 07/09/2017 1900   UROBILINOGEN 0.2 11/09/2014 1532   NITRITE NEGATIVE 07/09/2017 1900   LEUKOCYTESUR NEGATIVE 07/09/2017 1900    Radiological Exams on Admission: Dg Chest Portable 1 View  Result Date: 07/09/2017 CLINICAL DATA:  Shortness of breath for several days EXAM: PORTABLE CHEST 1 VIEW COMPARISON:  05/04/2017 FINDINGS: Cardiac shadow is enlarged but stable. Aortic calcifications are again seen. The lungs are well aerated bilaterally with mild interstitial changes of a chronic nature. No focal infiltrate or sizable effusion is seen. No acute bony abnormality is noted. IMPRESSION: Chronic changes without acute abnormality. Electronically Signed   By: Inez Catalina M.D.   On: 07/09/2017 18:53    EKG: Independently reviewed. A fib with rvr.  Assessment/Plan Active Problems:   Essential hypertension   Coronary artery disease of native artery of native heart with stable angina pectoris (HCC)   Acute respiratory failure with hypoxia (HCC)   Atrial fibrillation with RVR (HCC)   Acute CHF (congestive heart failure) (HCC)   AAA (abdominal aortic aneurysm) (H[facility])   # Acute hypoxic respiratory failure # Atrial fibrillation with rvr # Possible COPD exacerbation # CHF exacerbation # CAD s/p stent - in ED O2 upper 83s, to mid 90s with 2 L [place]. Tachypnic and wheezing on exam. Is in a-fib with rvr, which could be causative or contributory. CXR not suggestive of infection, but sig wheeze, smoking hx, and recent admit requiring O2 and d/c on anticholinergic suggestive of COPD component, as does patient's stated response to duoneb in ED tonight, though CXR without obvious signs copd. bnp mildly elevated and edematous on exam, though no pulmonary edema seen on cxr; do think component of chf -  possibly 2/2 rvr- contributory. Normal EF on TTE earlier this year. Lasix naive. Started on dilt drip in ED, HR now appropriate. No signs dvt and on eliquis so PE less likely - continue diltiazem gtt; wean when able. Resume home metop when able.  - [place] O2 - lasix 20 mg IV qd, daily weights, strict is and os. Dry weight is 205 - duonebs scheduled, prednisone 40 qd, augmentin for possible copd flare - tele overnight - f/u troponin. ecg non-ischemic - continue home eliquis  # HTN - here bp elevated initially, most bp low normal - dilt/metop as above.  - hold home  amlod while on dilt gtt - hold home lisin/hctz for now  # Insomnia - continue home trazodone and melatonin  # bph - continue home tamsulosin and finasteride  # AAA - 5.4 cm on recent TTE. Pt has not been informed, family wants to wait to inform. Hasn't scheduled thoracic surg f/u, unsure if will - will be checking TTE as above  # Hyperthyroid - listed in problem list. Does not appear to be on meds - TSH     DVT prophylaxis: lovenox Code Status: dnr, confirmed w/ family  Family Communication: daughter Rebeca Alert  Disposition Plan: tbd  Consults called: none  Admission status: tele med/surg    Desma Maxim MD [facility] Pager [phone]  If 7PM-7AM, please contact night-coverage [email or web address] Password [facility]  07/09/2017, 10:08 PM

## 2017-07-09 NOTE — ED Triage Notes (Signed)
Pt states 3 days of shortness of breath with cough. Denies fevers. Denies swelling or weight gain. Pt has audible wheezing to all lobes.

## 2017-07-09 NOTE — ED Notes (Signed)
Nurse will collect 2nd set of blood culture.

## 2017-07-09 NOTE — Patient Outreach (Addendum)
[facility]) Care Management  07/09/2017  [name] [name] 1925-03-18 [id number]   Telephone Assessment  RN spoke with daughter Laurence Spates who updated RN on pt's ongoing management of care. Other then a common cold at this time pt is doing well with no reported issues.Reports pt has finished with [facility] services and recent diagnosed with Atrial Fib. RN offered further discussion on this topic and confirmed caregiver has received the [facility] calendar and informed to review the ATRIAL FIBRILLATION take as discussed today on what to monitoring and when to seek medical attention with acute symptoms. Also educated on the new medication Eliquis and possible side effects with long term use. No other inquires or request at this time as pt is doing well with family support on his ongoing management of care.   Based upon the referral from April pt has met all goals with no hospitalization reported will be graduating from the [facility] program and services related to that program.  Offered further incite on pt's recent diagnosis however caregiver feels good about the care he is recieving in managing this condition. Again stress caregiver to review the information in the [facility] calendar concerning this condition. Caregiver aware she may contact this RN case manager at anytime for additional information and/or education on pt's medical conditions or for community resources as needed.  Will continue to encouraged ongoing management of care in maintaining his health. Caregiver aware of [facility] contact number and alerted that RN will contact her provider on pt's disposition via [facility] services. This case will be closed as pt will be graduating form the [facility] program and services.  Raina Mina, RN Care Management Coordinator [place] Office [phone]

## 2017-07-10 ENCOUNTER — Inpatient Hospital Stay (HOSPITAL_COMMUNITY): Payer: Medicare Other

## 2017-07-10 DIAGNOSIS — G4709 Other insomnia: Secondary | ICD-10-CM

## 2017-07-10 LAB — BASIC METABOLIC PANEL
ANION GAP: 11 (ref 5–15)
BUN: 18 mg/dL (ref 8–23)
CHLORIDE: 103 mmol/L (ref 98–111)
CO2: 25 mmol/L (ref 22–32)
Calcium: 9 mg/dL (ref 8.9–10.3)
Creatinine, Ser: 1.31 mg/dL — ABNORMAL HIGH (ref 0.61–1.24)
GFR, EST AFRICAN AMERICAN: 54 mL/min — AB (ref >60)
GFR, EST NON AFRICAN AMERICAN: 46 mL/min — AB (ref >60)
Glucose, Bld: 202 mg/dL — ABNORMAL HIGH (ref 70–99)
POTASSIUM: 3.2 mmol/L — AB (ref 3.5–5.1)
Sodium: 139 mmol/L (ref 135–145)

## 2017-07-10 LAB — RESPIRATORY PANEL BY PCR
ADENOVIRUS-RVPPCR: NOT DETECTED
BORDETELLA PERTUSSIS-RVPCR: NOT DETECTED
CHLAMYDOPHILA PNEUMONIAE-RVPPCR: NOT DETECTED
CORONAVIRUS HKU1-RVPPCR: NOT DETECTED
CORONAVIRUS NL63-RVPPCR: NOT DETECTED
Coronavirus 229E: NOT DETECTED
Coronavirus OC43: NOT DETECTED
INFLUENZA A-RVPPCR: NOT DETECTED
Influenza B: NOT DETECTED
MYCOPLASMA PNEUMONIAE-RVPPCR: NOT DETECTED
Metapneumovirus: NOT DETECTED
PARAINFLUENZA VIRUS 4-RVPPCR: NOT DETECTED
Parainfluenza Virus 1: NOT DETECTED
Parainfluenza Virus 2: NOT DETECTED
Parainfluenza Virus 3: NOT DETECTED
Respiratory Syncytial Virus: NOT DETECTED
Rhinovirus / Enterovirus: DETECTED — AB

## 2017-07-10 LAB — TROPONIN I: Troponin I: <0.03 ng/mL (ref <0.03)

## 2017-07-10 LAB — STREP PNEUMONIAE URINARY ANTIGEN: STREP PNEUMO URINARY ANTIGEN: NEGATIVE

## 2017-07-10 MED ORDER — DM-GUAIFENESIN ER 30-600 MG PO TB12
1.0000 | ORAL_TABLET | Freq: Two times a day (BID) | ORAL | Status: DC
  Administered 2017-07-10 – 2017-07-14 (×10): 1 via ORAL
  Filled 2017-07-10 (×11): qty 1

## 2017-07-10 MED ORDER — DIGOXIN 0.25 MG/ML IJ SOLN
0.2500 mg | Freq: Once | INTRAMUSCULAR | Status: AC
  Administered 2017-07-10: 0.25 mg via INTRAVENOUS
  Filled 2017-07-10: qty 1

## 2017-07-10 MED ORDER — POTASSIUM CHLORIDE CRYS ER 20 MEQ PO TBCR
40.0000 meq | EXTENDED_RELEASE_TABLET | Freq: Two times a day (BID) | ORAL | Status: AC
  Administered 2017-07-10 (×2): 40 meq via ORAL
  Filled 2017-07-10 (×2): qty 2

## 2017-07-10 MED ORDER — SODIUM CHLORIDE 0.9 % IV SOLN
500.0000 mg | INTRAVENOUS | Status: DC
  Administered 2017-07-10: 500 mg via INTRAVENOUS
  Filled 2017-07-10 (×2): qty 500

## 2017-07-10 MED ORDER — IPRATROPIUM-ALBUTEROL 0.5-2.5 (3) MG/3ML IN SOLN: 3.0000 mL | Freq: Two times a day (BID) | RESPIRATORY_TRACT | Status: DC

## 2017-07-10 MED ORDER — LEVALBUTEROL HCL 1.25 MG/0.5ML IN NEBU
1.2500 mg | INHALATION_SOLUTION | Freq: Two times a day (BID) | RESPIRATORY_TRACT | Status: DC
  Administered 2017-07-11 – 2017-07-12 (×3): 1.25 mg via RESPIRATORY_TRACT
  Filled 2017-07-10 (×3): qty 0.5

## 2017-07-10 MED ORDER — SODIUM CHLORIDE 0.9 % IV SOLN
1.0000 g | INTRAVENOUS | Status: DC
  Administered 2017-07-10 – 2017-07-11 (×2): 1 g via INTRAVENOUS
  Filled 2017-07-10 (×3): qty 10

## 2017-07-10 MED ORDER — LEVALBUTEROL HCL 1.25 MG/0.5ML IN NEBU
1.2500 mg | INHALATION_SOLUTION | Freq: Three times a day (TID) | RESPIRATORY_TRACT | Status: DC
  Administered 2017-07-10: 1.25 mg via RESPIRATORY_TRACT
  Filled 2017-07-10: qty 0.5

## 2017-07-10 NOTE — Progress Notes (Signed)
PROGRESS NOTE    [name] [name]  ZTI:[id number] DOB: 07-15-1925 DOA: 07/09/2017 PCP: [name], MD   Brief Narrative:  84 y.o. WM PMHx PTSD, Mania, Depression, dementia, HTN, CAD S/P DES to LAD 2016), A. fib on chronic anticoagulation (Eliquis), Descending AAA presbycusis, Hyperthyroid,  lives at home with significant family support  Presents for,.   Hx obtained from patient and daughter [name].   Extended hospital stay earlier this year for cap/flu. Discharged home on daily anticholinergic inhalers. Denies history asthma/copd, is a former smoker. Denies history diagnosed copd or asthma. Denies history heart failure. Compliant with medications per daughter.    Notes for the past 3 days slowly worsening shortness of breath and dyspnea on exertion. No chest pain. Some cough, at times productive. No fevers, no hemoptysis. Denies calf pain or swelling. No hx PE. Denies palpitations. Denies sick contacts. Went to urgent care today, who referred to ED. Not on oxygen at home. Feels somewhat better after duoneb. Baseline weight is 205 per daughter. Denies orthopnea.    Subjective: 6/29 A/O x4, positive S OB (does not wear home O2), negative CP, negative abdominal pain.   Assessment & Plan:   Active Problems:   Essential hypertension   Coronary artery disease of native artery of native heart with stable angina pectoris (HCC)   Acute respiratory failure with hypoxia (HCC)   Atrial fibrillation with RVR (HCC)   Acute CHF (congestive heart failure) (HCC)   AAA (abdominal aortic aneurysm) (HCC)    Acute respiratory failure with hypoxia/COPD exacerbation -Xopenex TID -Mucinex DM -Prednisone 40 mg -Spiriva 18 mcg daily -Flutter valve - Negative leukocytosis but it 84 years old may not mount an immune response.  Start CAP protocol prophylactically  CHF (dry weight 205 pounds/92.9 kg) -Strict in and out -Daily weight -Transfuse for hemoglobin<8 -  A. fib with RVR - Apixaban 5 mg  BID - Trend troponin - Diltiazem drip - Digoxin 0.25 mg x 1   Essential HTN -Hold amlodipine, lisinopril/HCTZ   AAA -5.4 cm on recent TTE.  Patient has not been informed family wants to wait to inform him. - Has not been seen by thoracic surgeon. -Will speak with daughter concerning the significant risks.   Insomnia -Trazodone 50 mg Qhs -Melatonin  Hypothyroid -Listed on problem list not on medication. - 6/28 TSH WNL  BPH -Continue Flomax + finasteride   Hypokalemia - Potassium goal> 4 -K-Dur 40 mEq x 2 doses    DVT prophylaxis: Apixaban Code Status: DNR Family Communication: Daughter at bedside for discussion of plan of care Disposition Plan: TBD   Consultants:    Procedures/Significant Events:     I have personally reviewed and interpreted all radiology studies and my findings are as above.  VENTILATOR SETTINGS:    Cultures 6/29 respiratory virus panel pending 6/29 strep pneumo urine antigen/Legionella urine antigen pending   Antimicrobials: Anti-infectives (From admission, onward)   Start     Stop   07/10/17 2000  cefTRIAXone (ROCEPHIN) 1 g in sodium chloride 0.9 % 100 mL IVPB     07/17/17 1959   07/10/17 2000  azithromycin (ZITHROMAX) 500 mg in sodium chloride 0.9 % 250 mL IVPB     07/17/17 1959   07/09/17 2245  amoxicillin-clavulanate (AUGMENTIN) 875-125 MG per tablet 1 tablet  Status:  Discontinued     07/10/17 1922   07/09/17 2000  cefTRIAXone (ROCEPHIN) 1 g in sodium chloride 0.9 % 100 mL IVPB     07/09/17 2152   07/09/17 2000  azithromycin [facility]) tablet 500 mg     07/09/17 2051       Devices    LINES / TUBES:      Continuous Infusions: . sodium chloride    . diltiazem (CARDIZEM) infusion 5 mg/hr (07/10/17 0419)     Objective: Vitals:   07/10/17 0530 07/10/17 0625 07/10/17 0745 07/10/17 0800  BP: 116/65 96/61 120/68 105/64  Pulse: 96 76 (!) 101 88  Resp: 14 19 17  (!) 22  Temp:      SpO2: 95% 93% 95% 94%     Intake/Output Summary (Last 24 hours) at 07/10/2017 0852 Last data filed at 07/09/2017 2152 Gross per 24 hour  Intake 102.17 ml  Output -  Net 102.17 ml   There were no vitals filed for this visit.  Examination:  General: A/O x4, positive acute respiratory distress Neck:  Negative scars, masses, torticollis, lymphadenopathy, JVD Lungs: diffuse poor air movement, negative wheeze or crackles  Cardiovascular: Irregularly irregular rhythm and rate, without murmur gallop or rub normal S1 and S2 Abdomen: negative abdominal pain, nondistended, positive soft, bowel sounds, no rebound, no ascites, no appreciable mass Extremities: No significant cyanosis, clubbing, or edema bilateral lower extremities Skin: Negative rashes, lesions, ulcers Psychiatric:  Negative depression, negative anxiety, negative fatigue, negative mania  Central nervous system:  Cranial nerves II through XII intact, tongue/uvula midline, all extremities muscle strength 5/5, sensation intact throughout, negative dysarthria, negative expressive aphasia, negative receptive aphasia.  .     Data Reviewed: Care during the described time interval was provided by me .  I have reviewed this patient's available data, including medical history, events of note, physical examination, and all test results as part of my evaluation.   CBC: Recent Labs  Lab 07/09/17 1821  WBC 9.3  NEUTROABS 7.2  HGB 16.1  HCT 49.7  MCV 92.7  PLT 378   Basic Metabolic Panel: Recent Labs  Lab 07/09/17 1821 07/10/17 0359  NA 141 139  K 4.2 3.2*  CL 103 103  CO2 29 25  GLUCOSE 112* 202*  BUN 15 18  CREATININE 1.03 1.31*  CALCIUM 9.7 9.0   GFR: CrCl cannot be calculated (Unknown ideal weight.). Liver Function Tests: Recent Labs  Lab 07/09/17 1821  AST 24  ALT 10  ALKPHOS 96  BILITOT 1.3*  PROT 6.7  ALBUMIN 3.8   No results for input(s): LIPASE, AMYLASE in the last 168 hours. No results for input(s): AMMONIA in the last 168  hours. Coagulation Profile: No results for input(s): INR, PROTIME in the last 168 hours. Cardiac Enzymes: Recent Labs  Lab 07/09/17 2057  TROPONINI <0.03   BNP (last 3 results) No results for input(s): PROBNP in the last 8760 hours. HbA1C: No results for input(s): HGBA1C in the last 72 hours. CBG: No results for input(s): GLUCAP in the last 168 hours. Lipid Profile: No results for input(s): CHOL, HDL, LDLCALC, TRIG, CHOLHDL, LDLDIRECT in the last 72 hours. Thyroid Function Tests: Recent Labs    07/09/17 2048  TSH 0.457   Anemia Panel: No results for input(s): VITAMINB12, FOLATE, FERRITIN, TIBC, IRON, RETICCTPCT in the last 72 hours. Urine analysis:    Component Value Date/Time   COLORURINE YELLOW 07/09/2017 1900   APPEARANCEUR CLEAR 07/09/2017 1900   LABSPEC 1.014 07/09/2017 1900   PHURINE 6.0 07/09/2017 1900   GLUCOSEU NEGATIVE 07/09/2017 1900   HGBUR NEGATIVE 07/09/2017 1900   BILIRUBINUR NEGATIVE 07/09/2017 1900   KETONESUR NEGATIVE 07/09/2017 1900   PROTEINUR NEGATIVE 07/09/2017 1900  UROBILINOGEN 0.2 11/09/2014 1532   NITRITE NEGATIVE 07/09/2017 1900   LEUKOCYTESUR NEGATIVE 07/09/2017 1900   Sepsis Labs: @LABRCNTIP (procalcitonin:4,lacticidven:4)  )No results found for this or any previous visit (from the past 240 hour(s)).       Radiology Studies: Dg Chest Portable 1 View  Result Date: 07/09/2017 CLINICAL DATA:  Shortness of breath for several days EXAM: PORTABLE CHEST 1 VIEW COMPARISON:  05/04/2017 FINDINGS: Cardiac shadow is enlarged but stable. Aortic calcifications are again seen. The lungs are well aerated bilaterally with mild interstitial changes of a chronic nature. No focal infiltrate or sizable effusion is seen. No acute bony abnormality is noted. IMPRESSION: Chronic changes without acute abnormality. Electronically Signed   By: Inez Catalina M.D.   On: 07/09/2017 18:53        Scheduled Meds: . amoxicillin-clavulanate  1 tablet Oral Q12H  .  apixaban  5 mg Oral BID  . atorvastatin  10 mg Oral Daily  . finasteride  5 mg Oral Daily  . furosemide  20 mg Intravenous Daily  . lisinopril  20 mg Oral Daily   And  . hydrochlorothiazide  25 mg Oral Daily  . ipratropium-albuterol  3 mL Nebulization Q6H  . isosorbide mononitrate  30 mg Oral Daily  . loratadine  10 mg Oral Daily  . Melatonin  3 mg Oral QPM  . pantoprazole  40 mg Oral Daily  . predniSONE  40 mg Oral Q breakfast  . sodium chloride flush  3 mL Intravenous Q12H  . tamsulosin  0.4 mg Oral QPC supper  . tiotropium  18 mcg Inhalation Daily  . traZODone  50 mg Oral QHS   Continuous Infusions: . sodium chloride    . diltiazem (CARDIZEM) infusion 5 mg/hr (07/10/17 0419)     LOS: 1 day    Time spent: 40 minutes    Ercel Normoyle, Geraldo Docker, MD [facility] Pager [phone]   If 7PM-7AM, please contact night-coverage [email or web address] Password [facility] 07/10/2017, 8:52 AM

## 2017-07-10 NOTE — ED Notes (Signed)
Low bp readings, pt found lying with arm dependent across chest, pt repositioned to supine, blood pressures still reading low, cardizem turned down to 5mg /hr, attempting to contact MD at this time

## 2017-07-10 NOTE — ED Notes (Signed)
Hospitalist contacted this RN, made aware of low BPs, this RN advised cardizem was titrated down to 5mg /hr, per MD okay to keep cardizem at 5mg /hr as long as systolic bp stays in the 57'X and heart rate around 100

## 2017-07-10 NOTE — ED Notes (Signed)
Requested patient lay in supine position, bp rechecked, WNL

## 2017-07-10 NOTE — Progress Notes (Signed)
MD paged and confirmed pt does not need I&O Cath. Will continue to monitor pt.

## 2017-07-10 NOTE — ED Notes (Signed)
Per MD Wouk leave cardizem at 10 and monitor blood pressure to maintain a map of 65.

## 2017-07-10 NOTE — ED Notes (Signed)
Report called to [address].

## 2017-07-11 ENCOUNTER — Inpatient Hospital Stay (HOSPITAL_COMMUNITY): Payer: Medicare Other

## 2017-07-11 DIAGNOSIS — I351 Nonrheumatic aortic (valve) insufficiency: Secondary | ICD-10-CM

## 2017-07-11 DIAGNOSIS — I4891 Unspecified atrial fibrillation: Secondary | ICD-10-CM

## 2017-07-11 DIAGNOSIS — I509 Heart failure, unspecified: Secondary | ICD-10-CM

## 2017-07-11 LAB — ECHOCARDIOGRAM COMPLETE
Height: 71 in
WEIGHTICAEL: 3322.77 [oz_av]

## 2017-07-11 LAB — BASIC METABOLIC PANEL
ANION GAP: 8 (ref 5–15)
BUN: 19 mg/dL (ref 8–23)
CHLORIDE: 105 mmol/L (ref 98–111)
CO2: 28 mmol/L (ref 22–32)
Calcium: 9.2 mg/dL (ref 8.9–10.3)
Creatinine, Ser: 1.04 mg/dL (ref 0.61–1.24)
GFR calc Af Amer: >60 mL/min (ref >60)
GLUCOSE: 166 mg/dL — AB (ref 70–99)
POTASSIUM: 3.7 mmol/L (ref 3.5–5.1)
Sodium: 141 mmol/L (ref 135–145)

## 2017-07-11 LAB — EXPECTORATED SPUTUM ASSESSMENT W REFEX TO RESP CULTURE

## 2017-07-11 LAB — CBC
HEMATOCRIT: 47.4 % (ref 39.0–52.0)
Hemoglobin: 15.2 g/dL (ref 13.0–17.0)
MCH: 29.5 pg (ref 26.0–34.0)
MCHC: 32.1 g/dL (ref 30.0–36.0)
MCV: 92 fL (ref 78.0–100.0)
Platelets: 175 10*3/uL (ref 150–400)
RBC: 5.15 MIL/uL (ref 4.22–5.81)
RDW: 13.5 % (ref 11.5–15.5)
WBC: 13.4 10*3/uL — ABNORMAL HIGH (ref 4.0–10.5)

## 2017-07-11 LAB — EXPECTORATED SPUTUM ASSESSMENT W GRAM STAIN, RFLX TO RESP C

## 2017-07-11 LAB — HIV ANTIBODY (ROUTINE TESTING W REFLEX): HIV Screen 4th Generation wRfx: NONREACTIVE

## 2017-07-11 LAB — MAGNESIUM: Magnesium: 1.8 mg/dL (ref 1.7–2.4)

## 2017-07-11 MED ORDER — AZITHROMYCIN 250 MG PO TABS
250.0000 mg | ORAL_TABLET | ORAL | Status: DC
  Administered 2017-07-11: 250 mg via ORAL
  Filled 2017-07-11 (×2): qty 1

## 2017-07-11 MED ORDER — METOPROLOL TARTRATE 5 MG/5ML IV SOLN
5.0000 mg | Freq: Three times a day (TID) | INTRAVENOUS | Status: DC
  Administered 2017-07-11 – 2017-07-12 (×5): 5 mg via INTRAVENOUS
  Filled 2017-07-11 (×5): qty 5

## 2017-07-11 NOTE — Progress Notes (Signed)
PROGRESS NOTE    [name] [name]  UQJ:[id number] DOB: 1925-04-14 DOA: 07/09/2017 PCP: [name] Seashore, MD   Brief Narrative:  84 y.o. WM PMHx PTSD, Mania, Depression, dementia, HTN, CAD S/P DES to LAD 2016), A. fib on chronic anticoagulation (Eliquis), Descending AAA presbycusis, Hyperthyroid,  lives at home with significant family support  Presents for,.   Hx obtained from patient and daughter [name] [name].   Extended hospital stay earlier this year for cap/flu. Discharged home on daily anticholinergic inhalers. Denies history asthma/copd, is a former smoker. Denies history diagnosed copd or asthma. Denies history heart failure. Compliant with medications per daughter.    Notes for the past 3 days slowly worsening shortness of breath and dyspnea on exertion. No chest pain. Some cough, at times productive. No fevers, no hemoptysis. Denies calf pain or swelling. No hx PE. Denies palpitations. Denies sick contacts. Went to urgent care today, who referred to ED. Not on oxygen at home. Feels somewhat better after duoneb. Baseline weight is 205 per daughter. Denies orthopnea.    Subjective: 6/30  A/O x4, positive S OB (improved), negative CP, negative abdominal pain    A/O x4, positive S OB (does not wear home O2), negative CP, negative abdominal pain.   Assessment & Plan:   Active Problems:   Essential hypertension   Coronary artery disease of native artery of native heart with stable angina pectoris (HCC)   Acute respiratory failure with hypoxia (HCC)   Atrial fibrillation with RVR (HCC)   Acute CHF (congestive heart failure) (HCC)   AAA (abdominal aortic aneurysm) (HCC)    Acute respiratory failure with hypoxia/COPD exacerbation/ positive rhinovirus -Xopenex TID -Mucinex DM -Prednisone 40 mg daily taper off quickly -Spiriva 18 mcg daily -Flutter valve - Negative leukocytosis but it 84 years old may not mount an immune response.  Start CAP protocol prophylactically  CHF (dry weight  205 pounds/92.9 kg) -Strict in and out patient -1.9 L -Daily weight Filed Weights   07/10/17 0922 07/11/17 0312  Weight: 200 lb 9.9 oz (91 kg) 207 lb 10.8 oz (94.2 kg)  -Transfuse for hemoglobin<8 - Lasix 20 mg daily -Imdur 30 mg daily -Lisinopril 20 mg daily -HCTZ 25 mg daily   A. fib with RVR -Currently NSR.  If remains in NSR overnight change Cardizem and metoprolol to p.o. in A.m. - Apixaban 5 mg BID - Trend troponin - Diltiazem drip - Digoxin 0.25 mg x 1 -Given patient's respiratory disease reluctant to use Amiodarone - Metoprolol IV 5 mg TID   Essential HTN -A. fib - See CHF  AAA -5.4 cm on recent TTE.  Patient has not been informed family wants to wait to inform him. - Has not been seen by thoracic surgeon. -Family decided not to tell patient as they believe/understand patient not candidate for repair.  Dr. Dani Gobble [name] cardiologist aware of problem, and is to speak to vascular surgeon (per daughter)   Insomnia -Trazodone 50 mg Qhs -Melatonin  Hypothyroid -Listed on problem list not on medication. - 6/28 TSH WNL  BPH -Continue Flomax + finasteride   Hypokalemia - Potassium goal> 4  Goals of care -6/30 PT/OT consult: Frail 84 year old with MMP, evaluate for CIR vs SNF   DVT prophylaxis: Apixaban Code Status: DNR Family Communication: Daughter at bedside for discussion of plan of care Disposition Plan: TBD   Consultants:    Procedures/Significant Events:     I have personally reviewed and interpreted all radiology studies and my findings are as above.  VENTILATOR SETTINGS:  Cultures 6/28 blood NGTD 6/29 blood NGTD 6/29 respiratory virus panel positive rhinovirus 6/29 strep pneumo urine antigen negative 6/29 Legionella urine antigen pending 6/29 HIV negative      Antimicrobials: Anti-infectives (From admission, onward)   Start     Stop   07/10/17 2000  cefTRIAXone (ROCEPHIN) 1 g in sodium chloride 0.9 % 100 mL IVPB      07/17/17 1959   07/10/17 2000  azithromycin (ZITHROMAX) 500 mg in sodium chloride 0.9 % 250 mL IVPB     07/17/17 1959   07/09/17 2245  amoxicillin-clavulanate (AUGMENTIN) 875-125 MG per tablet 1 tablet  Status:  Discontinued     07/10/17 1922   07/09/17 2000  cefTRIAXone (ROCEPHIN) 1 g in sodium chloride 0.9 % 100 mL IVPB     07/09/17 2152   07/09/17 2000  azithromycin (ZITHROMAX) tablet 500 mg     07/09/17 2051       Devices    LINES / TUBES:      Continuous Infusions: . sodium chloride    . azithromycin 500 mg (07/10/17 2339)  . cefTRIAXone (ROCEPHIN)  IV 1 g (07/10/17 2339)  . diltiazem (CARDIZEM) infusion 15 mg/hr (07/11/17 0825)     Objective: Vitals:   07/11/17 0400 07/11/17 0700 07/11/17 0730 07/11/17 0859  BP: (!) 158/95 (!) 172/78 (!) 151/86   Pulse: (!) 115 (!) 125 (!) 109   Resp: 19 20 19    Temp: 97.6 F (36.4 C)  97.7 F (36.5 C)   TempSrc: Oral  Oral   SpO2: 96% 96% 96% 94%  Weight:      Height:        Intake/Output Summary (Last 24 hours) at 07/11/2017 0912 Last data filed at 07/11/2017 0800 Gross per 24 hour  Intake 1378.18 ml  Output 2050 ml  Net -671.82 ml   Filed Weights   07/10/17 0922 07/11/17 0312  Weight: 200 lb 9.9 oz (91 kg) 207 lb 10.8 oz (94.2 kg)    Physical Exam:  General: A/O x4, positive acute respiratory distress (improving) Neck:  Negative scars, masses, torticollis, lymphadenopathy, JVD Lungs: poor air movement, (improved from 6/29), negative wheezes or crackles Cardiovascular: Regular rate and rhythm without murmur gallop or rub normal S1 and S2 Abdomen: negative abdominal pain, nondistended, positive soft, bowel sounds, no rebound, no ascites, no appreciable mass Extremities: No significant cyanosis, clubbing, or edema bilateral lower extremities Skin: Negative rashes, lesions, ulcers Psychiatric:  Negative depression, negative anxiety, negative fatigue, negative mania  Central nervous system:  Cranial nerves II  through XII intact, tongue/uvula midline, all extremities muscle strength 5/5, sensation intact throughout,negative dysarthria, negative expressive aphasia, negative receptive aphasia.  .     Data Reviewed: Care during the described time interval was provided by me .  I have reviewed this patient's available data, including medical history, events of note, physical examination, and all test results as part of my evaluation.   CBC: Recent Labs  Lab 07/09/17 1821  WBC 9.3  NEUTROABS 7.2  HGB 16.1  HCT 49.7  MCV 92.7  PLT 893   Basic Metabolic Panel: Recent Labs  Lab 07/09/17 1821 07/10/17 0359  NA 141 139  K 4.2 3.2*  CL 103 103  CO2 29 25  GLUCOSE 112* 202*  BUN 15 18  CREATININE 1.03 1.31*  CALCIUM 9.7 9.0   GFR: Estimated Creatinine Clearance: 43.9 mL/min (A) (by C-G formula based on SCr of 1.31 mg/dL (H)). Liver Function Tests: Recent Labs  Lab 07/09/17 1821  AST 24  ALT 10  ALKPHOS 96  BILITOT 1.3*  PROT 6.7  ALBUMIN 3.8   No results for input(s): LIPASE, AMYLASE in the last 168 hours. No results for input(s): AMMONIA in the last 168 hours. Coagulation Profile: No results for input(s): INR, PROTIME in the last 168 hours. Cardiac Enzymes: Recent Labs  Lab 07/09/17 2057 07/10/17 1405 07/10/17 1848  TROPONINI <0.03 <0.03 <0.03   BNP (last 3 results) No results for input(s): PROBNP in the last 8760 hours. HbA1C: No results for input(s): HGBA1C in the last 72 hours. CBG: No results for input(s): GLUCAP in the last 168 hours. Lipid Profile: No results for input(s): CHOL, HDL, LDLCALC, TRIG, CHOLHDL, LDLDIRECT in the last 72 hours. Thyroid Function Tests: Recent Labs    07/09/17 2048  TSH 0.457   Anemia Panel: No results for input(s): VITAMINB12, FOLATE, FERRITIN, TIBC, IRON, RETICCTPCT in the last 72 hours. Urine analysis:    Component Value Date/Time   COLORURINE YELLOW 07/09/2017 1900   APPEARANCEUR CLEAR 07/09/2017 1900   LABSPEC 1.014  07/09/2017 1900   PHURINE 6.0 07/09/2017 1900   GLUCOSEU NEGATIVE 07/09/2017 1900   HGBUR NEGATIVE 07/09/2017 1900   BILIRUBINUR NEGATIVE 07/09/2017 1900   KETONESUR NEGATIVE 07/09/2017 1900   PROTEINUR NEGATIVE 07/09/2017 1900   UROBILINOGEN 0.2 11/09/2014 1532   NITRITE NEGATIVE 07/09/2017 1900   LEUKOCYTESUR NEGATIVE 07/09/2017 1900   Sepsis Labs: @LABRCNTIP (procalcitonin:4,lacticidven:4)  ) Recent Results (from the past 240 hour(s))  Culture, blood (routine x 2)     Status: None (Preliminary result)   Collection Time: 07/09/17  8:35 PM  Result Value Ref Range Status   Specimen Description BLOOD RIGHT HAND  Final   Special Requests   Final    BOTTLES DRAWN AEROBIC AND ANAEROBIC Blood Culture adequate volume   Culture   Final    NO GROWTH < 12 HOURS Performed at [facility], [place] [address], [place], [place] [address]    Report Status PENDING  Incomplete  Culture, blood (routine x 2)     Status: None (Preliminary result)   Collection Time: 07/09/17  8:49 PM  Result Value Ref Range Status   Specimen Description BLOOD LEFT HAND  Final   Special Requests   Final    BOTTLES DRAWN AEROBIC AND ANAEROBIC Blood Culture adequate volume   Culture   Final    NO GROWTH < 12 HOURS Performed at [facility], [place] [address], [place], [place] [address]    Report Status PENDING  Incomplete  Respiratory Panel by PCR     Status: Abnormal   Collection Time: 07/10/17  1:40 PM  Result Value Ref Range Status   Adenovirus NOT DETECTED NOT DETECTED Final   Coronavirus 229E NOT DETECTED NOT DETECTED Final   Coronavirus HKU1 NOT DETECTED NOT DETECTED Final   Coronavirus NL63 NOT DETECTED NOT DETECTED Final   Coronavirus OC43 NOT DETECTED NOT DETECTED Final   Metapneumovirus NOT DETECTED NOT DETECTED Final   Rhinovirus / Enterovirus DETECTED (A) NOT DETECTED Final   Influenza A NOT DETECTED NOT DETECTED Final   Influenza B NOT DETECTED NOT DETECTED Final   Parainfluenza Virus 1  NOT DETECTED NOT DETECTED Final   Parainfluenza Virus 2 NOT DETECTED NOT DETECTED Final   Parainfluenza Virus 3 NOT DETECTED NOT DETECTED Final   Parainfluenza Virus 4 NOT DETECTED NOT DETECTED Final   Respiratory Syncytial Virus NOT DETECTED NOT DETECTED Final   Bordetella pertussis NOT DETECTED NOT DETECTED Final  Chlamydophila pneumoniae NOT DETECTED NOT DETECTED Final   Mycoplasma pneumoniae NOT DETECTED NOT DETECTED Final    Comment: Performed at [facility], [place] [address], [place], [place] [address]         Radiology Studies: Dg Chest Port 1 View  Result Date: 07/10/2017 CLINICAL DATA:  Shortness of breath and wheezing EXAM: PORTABLE CHEST 1 VIEW COMPARISON:  07/09/2017 FINDINGS: Chronic cardiomegaly and vascular pedicle widening. Low lung volumes, worsened from before. Increased streaky opacity. Generalized interstitial coarsening, chronic. No Kerley lines, effusion, or bronchograms. IMPRESSION: 1. Lower volumes and increased atelectasis compared to yesterday. 2. Chronic cardiomegaly and bronchitic markings. Electronically Signed   By: Monte Fantasia M.D.   On: 07/10/2017 14:27   Dg Chest Portable 1 View  Result Date: 07/09/2017 CLINICAL DATA:  Shortness of breath for several days EXAM: PORTABLE CHEST 1 VIEW COMPARISON:  05/04/2017 FINDINGS: Cardiac shadow is enlarged but stable. Aortic calcifications are again seen. The lungs are well aerated bilaterally with mild interstitial changes of a chronic nature. No focal infiltrate or sizable effusion is seen. No acute bony abnormality is noted. IMPRESSION: Chronic changes without acute abnormality. Electronically Signed   By: Inez Catalina M.D.   On: 07/09/2017 18:53        Scheduled Meds: . apixaban  5 mg Oral BID  . atorvastatin  10 mg Oral Daily  . dextromethorphan-guaiFENesin  1 tablet Oral BID  . finasteride  5 mg Oral Daily  . furosemide  20 mg Intravenous Daily  . lisinopril  20 mg Oral Daily   And  .  hydrochlorothiazide  25 mg Oral Daily  . isosorbide mononitrate  30 mg Oral Daily  . levalbuterol  1.25 mg Nebulization BID  . loratadine  10 mg Oral Daily  . Melatonin  3 mg Oral QPM  . pantoprazole  40 mg Oral Daily  . predniSONE  40 mg Oral Q breakfast  . sodium chloride flush  3 mL Intravenous Q12H  . tamsulosin  0.4 mg Oral QPC supper  . tiotropium  18 mcg Inhalation Daily  . traZODone  50 mg Oral QHS   Continuous Infusions: . sodium chloride    . azithromycin 500 mg (07/10/17 2339)  . cefTRIAXone (ROCEPHIN)  IV 1 g (07/10/17 2339)  . diltiazem (CARDIZEM) infusion 15 mg/hr (07/11/17 0825)     LOS: 2 days    Time spent: 40 minutes    Maudine Kluesner, Geraldo Docker, MD [facility] Pager [phone]   If 7PM-7AM, please contact night-coverage [email or web address] Password [email or web address] 07/11/2017, 9:12 AM

## 2017-07-11 NOTE — Progress Notes (Signed)
  Echocardiogram 2D Echocardiogram has been performed.  [name] [name] Baileigh Modisette 07/11/2017, 9:13 AM

## 2017-07-11 NOTE — Progress Notes (Signed)
Pt HR still elevated 115-130's. Cardizem gtt at 15mg /hr.Pt in no distress at this time. Dr. Sherral Hammers paged to make aware. Will continue to monitor.  Arnell Sieving, RN, BSN

## 2017-07-11 NOTE — Progress Notes (Signed)
PHARMACIST - PHYSICIAN COMMUNICATION DR:   TRH CONCERNING: Antibiotic IV to Oral Route Change Policy  RECOMMENDATION: This patient is receiving azithromycin by the intravenous route.  Based on criteria approved by the Pharmacy and Therapeutics Committee, the antibiotic(s) is/are being converted to the equivalent oral dose form(s).   DESCRIPTION: These criteria include:  Patient being treated for a respiratory tract infection, urinary tract infection, cellulitis or clostridium difficile associated diarrhea if on metronidazole  The patient is not neutropenic and does not exhibit a GI malabsorption state  The patient is eating (either orally or via tube) and/or has been taking other orally administered medications for a least 24 hours  The patient is improving clinically and has a Tmax < 100.5  If you have questions about this conversion, please contact the Pharmacy Department  []   [phone] )  [name] [name] []   [phone] )  [facility] [x]   [phone] )  [facility]ac[facility] Pontes []   [phone] )  [facility] []   [phone] )  [facility]

## 2017-07-12 DIAGNOSIS — J40 Bronchitis, not specified as acute or chronic: Secondary | ICD-10-CM

## 2017-07-12 DIAGNOSIS — R0602 Shortness of breath: Secondary | ICD-10-CM

## 2017-07-12 LAB — CBC
HCT: 42.9 % (ref 39.0–52.0)
Hemoglobin: 14.1 g/dL (ref 13.0–17.0)
MCH: 30.4 pg (ref 26.0–34.0)
MCHC: 32.9 g/dL (ref 30.0–36.0)
MCV: 92.5 fL (ref 78.0–100.0)
Platelets: 171 10*3/uL (ref 150–400)
RBC: 4.64 MIL/uL (ref 4.22–5.81)
RDW: 13.5 % (ref 11.5–15.5)
WBC: 10.8 10*3/uL — ABNORMAL HIGH (ref 4.0–10.5)

## 2017-07-12 LAB — BASIC METABOLIC PANEL
ANION GAP: 9 (ref 5–15)
BUN: 23 mg/dL (ref 8–23)
CHLORIDE: 101 mmol/L (ref 98–111)
CO2: 30 mmol/L (ref 22–32)
Calcium: 8.9 mg/dL (ref 8.9–10.3)
Creatinine, Ser: 1.05 mg/dL (ref 0.61–1.24)
GFR calc Af Amer: >60 mL/min (ref >60)
GFR calc non Af Amer: >60 mL/min (ref >60)
Glucose, Bld: 118 mg/dL — ABNORMAL HIGH (ref 70–99)
POTASSIUM: 3.3 mmol/L — AB (ref 3.5–5.1)
Sodium: 140 mmol/L (ref 135–145)

## 2017-07-12 LAB — LEGIONELLA PNEUMOPHILA SEROGP 1 UR AG: L. pneumophila Serogp 1 Ur Ag: NEGATIVE

## 2017-07-12 LAB — MAGNESIUM: MAGNESIUM: 1.9 mg/dL (ref 1.7–2.4)

## 2017-07-12 MED ORDER — POTASSIUM CHLORIDE CRYS ER 20 MEQ PO TBCR
40.0000 meq | EXTENDED_RELEASE_TABLET | Freq: Once | ORAL | Status: AC
  Administered 2017-07-12: 40 meq via ORAL
  Filled 2017-07-12: qty 2

## 2017-07-12 MED ORDER — LEVALBUTEROL HCL 1.25 MG/0.5ML IN NEBU
1.2500 mg | INHALATION_SOLUTION | Freq: Four times a day (QID) | RESPIRATORY_TRACT | Status: DC
  Administered 2017-07-12 – 2017-07-13 (×2): 1.25 mg via RESPIRATORY_TRACT
  Filled 2017-07-12 (×2): qty 0.5

## 2017-07-12 MED ORDER — DILTIAZEM HCL 60 MG PO TABS: 60.0000 mg | ORAL_TABLET | Freq: Four times a day (QID) | ORAL | Status: DC

## 2017-07-12 MED ORDER — METOPROLOL TARTRATE 100 MG PO TABS
100.0000 mg | ORAL_TABLET | Freq: Two times a day (BID) | ORAL | Status: DC
  Filled 2017-07-12: qty 1

## 2017-07-12 MED ORDER — LEVALBUTEROL HCL 0.63 MG/3ML IN NEBU: 0.6300 mg | INHALATION_SOLUTION | RESPIRATORY_TRACT | Status: DC | PRN

## 2017-07-12 MED ORDER — DILTIAZEM HCL 60 MG PO TABS
60.0000 mg | ORAL_TABLET | Freq: Four times a day (QID) | ORAL | Status: AC
  Administered 2017-07-12 – 2017-07-13 (×4): 60 mg via ORAL
  Filled 2017-07-12 (×4): qty 1

## 2017-07-12 MED ORDER — METOPROLOL TARTRATE 100 MG PO TABS
100.0000 mg | ORAL_TABLET | Freq: Two times a day (BID) | ORAL | Status: DC
  Administered 2017-07-12 – 2017-07-16 (×8): 100 mg via ORAL
  Filled 2017-07-12 (×8): qty 1

## 2017-07-12 NOTE — Progress Notes (Signed)
[place] TEAM 1 - Stepdown/ICU TEAM  [name] [name]  DUK:[id number] DOB: 02/19/1925 DOA: 07/09/2017 PCP: [name], MD    Brief Narrative:  84yo M w/ a Hx of PTSD, Mania, Depression, Dementia, HTN, CAD S/P DES to LAD 2016, A. fib on chronic Eliquis, Descending AAA, and Hypothyroidism who presented w/ 3 days of slowly worsening shortness of breath and dyspnea on exertion.   Significant Events: 6/28 admit 6/30 TTE -   Subjective: The patient is alert but confused.  He is pleasant.  He denies chest pain shortness of breath nausea or vomiting but the reliability of his ROS is in question due to his confusion.  Assessment & Plan:  Acute hypoxic respiratory failure Appears to be slowly resolving - likely due to viral URI exacerbating COPD  Rhinovirus URI Sx control   Acute COPD exacerbation Steroid stopped due to acute delirium - no evidence of acute bacterial infxn and pt has confirmed viral URI - stop abx - cont nebs   Diastolic CHF Baseline weight ~93kg - no gross overload on exam   Filed Weights   07/10/17 0922 07/11/17 0312 07/12/17 0600  Weight: 91 kg (200 lb 9.9 oz) 94.2 kg (207 lb 10.8 oz) 93.6 kg (206 lb 5.6 oz)    A. fib with RVR Rate control marginal at this time - transition to oral meds as able and follow   Hypokalemia  Supplement to goal of 4.0  HTN BP variable - follow w/o change today   AAA 5.4 cm on recent TTE  Hypothyroidism Cont usual med tx   BPH Some acute retention today - foley to be placed   Dementia w/ acute delirium Likely due to hospitalization + steroid use - stop steroids - check reversible causes   DVT prophylaxis: eliquis  Code Status: DNR - NO CODE Family Communication: no family present at time of exam  Disposition Plan: SDU  Consultants:  none  Antimicrobials:  Azithromycin 6/28 > 7/1 Rocephin 6/28 > 7/1  Objective: Blood pressure (!) 141/90, pulse 63, temperature 97.9 F (36.6 C), temperature source Oral, resp.  rate 19, height 5\' 11"  (1.803 m), weight 93.6 kg (206 lb 5.6 oz), SpO2 92 %.  Intake/Output Summary (Last 24 hours) at 07/12/2017 1433 Last data filed at 07/12/2017 1345 Gross per 24 hour  Intake 1061.96 ml  Output 1800 ml  Net -738.04 ml   Filed Weights   07/10/17 0922 07/11/17 0312 07/12/17 0600  Weight: 91 kg (200 lb 9.9 oz) 94.2 kg (207 lb 10.8 oz) 93.6 kg (206 lb 5.6 oz)    Examination: General: No acute respiratory distress Lungs: mild exp wheezing - no focal crackles  Cardiovascular: irreg irreg - rate ~90 - no M or rub  Abdomen: Nontender, nondistended, soft, bowel sounds positive, no rebound, no ascites, no appreciable mass Extremities: No significant cyanosis, clubbing, or edema bilateral lower extremities  CBC: Recent Labs  Lab 07/09/17 1821 07/11/17 0919 07/12/17 0415  WBC 9.3 13.4* 10.8*  NEUTROABS 7.2  --   --   HGB 16.1 15.2 14.1  HCT 49.7 47.4 42.9  MCV 92.7 92.0 92.5  PLT 174 175 376   Basic Metabolic Panel: Recent Labs  Lab 07/10/17 0359 07/11/17 0919 07/12/17 0415  NA 139 141 140  K 3.2* 3.7 3.3*  CL 103 105 101  CO2 25 28 30   GLUCOSE 202* 166* 118*  BUN 18 19 23   CREATININE 1.31* 1.04 1.05  CALCIUM 9.0 9.2 8.9  MG  --  1.8  1.9   GFR: Estimated Creatinine Clearance: 54.6 mL/min (by C-G formula based on SCr of 1.05 mg/dL).  Liver Function Tests: Recent Labs  Lab 07/09/17 1821  AST 24  ALT 10  ALKPHOS 96  BILITOT 1.3*  PROT 6.7  ALBUMIN 3.8    Cardiac Enzymes: Recent Labs  Lab 07/09/17 2057 07/10/17 1405 07/10/17 1848  TROPONINI <0.03 <0.03 <0.03    Recent Results (from the past 240 hour(s))  Culture, blood (routine x 2)     Status: None (Preliminary result)   Collection Time: 07/09/17  8:35 PM  Result Value Ref Range Status   Specimen Description BLOOD RIGHT HAND  Final   Special Requests   Final    BOTTLES DRAWN AEROBIC AND ANAEROBIC Blood Culture adequate volume   Culture   Final    NO GROWTH 3 DAYS Performed at [facility], [place] [address], [place], [place] [address]    Report Status PENDING  Incomplete  Culture, blood (routine x 2)     Status: None (Preliminary result)   Collection Time: 07/09/17  8:49 PM  Result Value Ref Range Status   Specimen Description BLOOD LEFT HAND  Final   Special Requests   Final    BOTTLES DRAWN AEROBIC AND ANAEROBIC Blood Culture adequate volume   Culture   Final    NO GROWTH 3 DAYS Performed at [facility], [place] [address], [place], [place] [address]    Report Status PENDING  Incomplete  Respiratory Panel by PCR     Status: Abnormal   Collection Time: 07/10/17  1:40 PM  Result Value Ref Range Status   Adenovirus NOT DETECTED NOT DETECTED Final   Coronavirus 229E NOT DETECTED NOT DETECTED Final   Coronavirus HKU1 NOT DETECTED NOT DETECTED Final   Coronavirus NL63 NOT DETECTED NOT DETECTED Final   Coronavirus OC43 NOT DETECTED NOT DETECTED Final   Metapneumovirus NOT DETECTED NOT DETECTED Final   Rhinovirus / Enterovirus DETECTED (A) NOT DETECTED Final   Influenza A NOT DETECTED NOT DETECTED Final   Influenza B NOT DETECTED NOT DETECTED Final   Parainfluenza Virus 1 NOT DETECTED NOT DETECTED Final   Parainfluenza Virus 2 NOT DETECTED NOT DETECTED Final   Parainfluenza Virus 3 NOT DETECTED NOT DETECTED Final   Parainfluenza Virus 4 NOT DETECTED NOT DETECTED Final   Respiratory Syncytial Virus NOT DETECTED NOT DETECTED Final   Bordetella pertussis NOT DETECTED NOT DETECTED Final   Chlamydophila pneumoniae NOT DETECTED NOT DETECTED Final   Mycoplasma pneumoniae NOT DETECTED NOT DETECTED Final    Comment: Performed at [facility], [address], [place], [place] [address]  Culture, sputum-assessment     Status: None   Collection Time: 07/10/17  7:22 PM  Result Value Ref Range Status   Specimen Description SPUTUM  Final   Special Requests NONE  Final   Sputum evaluation   Final    Sputum specimen not acceptable for testing.  Please recollect.     Gram Stain Report Called to,Read Back By and Verified With: RN Isac Caddy [phone] 2005 MLM Performed at [facility], [place] [address] [place], [place] [address]    Report Status 07/11/2017 FINAL  Final  Culture, blood (routine x 2) Call MD if unable to obtain prior to antibiotics being given     Status: None (Preliminary result)   Collection Time: 07/10/17  9:12 PM  Result Value Ref Range Status   Specimen Description BLOOD LEFT WRIST  Final   Special Requests   Final    BOTTLES DRAWN AEROBIC AND ANAEROBIC Blood Culture adequate volume   Culture   Final    NO GROWTH 2 DAYS Performed at [facility], [address] [place], [place] [address]    Report Status PENDING  Incomplete  Culture, blood (routine x 2) Call MD if unable to obtain prior to antibiotics being given     Status: None (Preliminary result)   Collection Time: 07/10/17  9:12 PM  Result Value Ref Range Status   Specimen Description BLOOD LEFT ANTECUBITAL  Final   Special Requests   Final    BOTTLES DRAWN AEROBIC AND ANAEROBIC Blood Culture adequate volume   Culture   Final    NO GROWTH 2 DAYS Performed at [facility], [place] [address], [place], [place] [address]    Report Status PENDING  Incomplete     Scheduled Meds: . apixaban  5 mg Oral BID  . atorvastatin  10 mg Oral Daily  . azithromycin  250 mg Oral Q24H  . dextromethorphan-guaiFENesin  1 tablet Oral BID  . finasteride  5 mg Oral Daily  . furosemide  20 mg Intravenous Daily  . lisinopril  20 mg Oral Daily   And  . hydrochlorothiazide  25 mg Oral Daily  . isosorbide mononitrate  30 mg Oral Daily  . levalbuterol  1.25 mg Nebulization BID  . loratadine  10 mg Oral Daily  . Melatonin  3 mg Oral QPM  . metoprolol tartrate  5 mg Intravenous Q8H  . pantoprazole  40 mg Oral Daily  . sodium chloride flush  3 mL Intravenous Q12H  . tamsulosin  0.4 mg Oral QPC supper  . tiotropium  18 mcg Inhalation Daily  . traZODone  50 mg Oral QHS    Continuous Infusions: . sodium chloride    . cefTRIAXone (ROCEPHIN)  IV Stopped (07/11/17 2123)  . diltiazem (CARDIZEM) infusion 15 mg/hr (07/12/17 1001)     LOS: 3 days   Cherene Altes, MD [facility] Office  [phone] Pager - Text Page per Amion as per below:  On-Call/Text Page:      [email or web address]      password [email or web address]  If 7PM-7AM, please contact night-coverage [email or web address] Password [facility] 07/12/2017, 2:33 PM

## 2017-07-12 NOTE — Evaluation (Signed)
Physical Therapy Evaluation Patient Details Name: [name] [name] MRN: [id number] DOB: [birth year] Today's Date: 07/12/2017   History of Present Illness  [name] [name] is a 84yo Male who comes to [facility] on 6/28 after 3day SOB/DOE. Pt admitted with ARF c hypoxia. PMH: HTN, CAD, hypothyroidism, AF, AAA.   Clinical Impression  Pt admitted with above diagnosis. Pt currently with functional limitations due to the deficits listed below (see "PT Problem List"). Upon entry, pt in bed, youngest daughter present. The pt is awake and agreeable to participate.  HR resting is 100s-120s in AF c RVR, when moved to EOB increases to 120s-130s with heightended anxiety. Pt returned to supine after about 2 minutes. Pt requires minA for supine to/from EOB, but normally does this independently. Functional mobility assessment demonstrates increased effort/time requirements, poor tolerance, and need for physical assistance, whereas the patient performed these at a higher level of independence PTA. Pt normally able to perform stand pivot transfers to/from wheelchair, but today is much weaker, also limited by tachycardia. Pt will benefit from skilled PT intervention to increase independence and safety with basic mobility in preparation for discharge to the venue listed below.       Follow Up Recommendations SNF(Prefers [facility], but family would like to look into [facility] in [place] )    Equipment Recommendations  None recommended by PT    Recommendations for Other Services       Precautions / Restrictions Precautions Precautions: Fall Restrictions Weight Bearing Restrictions: No      Mobility  Bed Mobility Overal bed mobility: Needs Assistance Bed Mobility: Supine to Sit;Sit to Supine     Supine to sit: Min assist Sit to supine: Min assist   General bed mobility comments: good strength and effort, but trunk is mildly weak, and difficulty controlling return to supine, assistance needed to  straighten trunk. (At EOB, anxiety increases, and HR increases 120s-130s. Pt returned to supine after 2 minutes. )  Transfers                    Ambulation/Gait                Stairs            Wheelchair Mobility    Modified Rankin (Stroke Patients Only)       Balance Overall balance assessment: Needs assistance Sitting-balance support: Bilateral upper extremity supported Sitting balance-Leahy Scale: Poor Sitting balance - Comments: trunk falling backward, potentially d/t anxiety about fallign forward from EOB.                                      Pertinent Vitals/Pain Pain Assessment: No/denies pain    Home Living Family/patient expects to be discharged to:: Private residence Living Arrangements: Alone Available Help at Discharge: Family;Available PRN/intermittently Type of Home: House Home Access: Ramped entrance       Home Equipment: Walker - 4 wheels;Wheelchair - [employer];Shower seat;Grab bars - toilet;Hospital bed Additional Comments: pt reports that he no longer walks, transfers in and out of w/c with arms and legs to propel self    Prior Function Level of Independence: Independent with assistive device(s)(3 daughters take turns stayint overnight in home, goes to [facility] program 5d/week. )         Comments: performs dressing/bathing with Modified independence d/t increased effort/time.      Hand Dominance   Dominant Hand:  Right    Extremity/Trunk Assessment   Upper Extremity Assessment Upper Extremity Assessment: Generalized weakness    Lower Extremity Assessment Lower Extremity Assessment: Generalized weakness;Overall WFL for tasks assessed       Communication   Communication: HOH  Cognition Arousal/Alertness: Awake/alert Behavior During Therapy: WFL for tasks assessed/performed Overall Cognitive Status: Within Functional Limits for tasks assessed(slow speech, slightly slurred, noted  mild [place] accent as well, tangential speech. )                                        General Comments      Exercises     Assessment/Plan    PT Assessment Patient needs continued PT services  PT Problem List Decreased strength;Decreased activity tolerance;Cardiopulmonary status limiting activity;Decreased mobility;Decreased balance       PT Treatment Interventions Functional mobility training;Balance training;Therapeutic exercise;Wheelchair mobility training;Patient/family education    PT Goals (Current goals can be found in the Care Plan section)  Acute Rehab PT Goals Patient Stated Goal: regain strength, return to home eventually PT Goal Formulation: With patient Time For Goal Achievement: 07/26/17 Potential to Achieve Goals: Good    Frequency Min 3X/week   Barriers to discharge Decreased caregiver support      Co-evaluation               AM-PAC PT "6 Clicks" Daily Activity  Outcome Measure Difficulty turning over in bed (including adjusting bedclothes, sheets and blankets)?: Unable Difficulty moving from lying on back to sitting on the side of the bed? : Unable Difficulty sitting down on and standing up from a chair with arms (e.g., wheelchair, bedside commode, etc,.)?: Unable Help needed moving to and from a bed to chair (including a wheelchair)?: Total Help needed walking in hospital room?: Total Help needed climbing 3-5 steps with a railing? : Total 6 Click Score: 6    End of Session Equipment Utilized During Treatment: Oxygen Activity Tolerance: Patient tolerated treatment well;Patient limited by fatigue;Treatment limited secondary to medical complications (Comment)(AF c RVR increasing to 130s) Patient left: in bed;with bed alarm set;with family/visitor present;with call bell/phone within reach Nurse Communication: Mobility status PT Visit Diagnosis: Difficulty in walking, not elsewhere classified (R26.2);Muscle weakness (generalized)  (M62.81)    Time: 6712-4580 PT Time Calculation (min) (ACUTE ONLY): 30 min   Charges:   PT Evaluation $PT Eval Moderate Complexity: 1 Mod PT Treatments $Therapeutic Activity: 8-22 mins   PT G Codes:        11:09 AM, 07/28/17 Etta Grandchild, PT, DPT Physical Therapist - [place] [phone] (Pager)  [phone] (Office)      Buccola,Allan C 07/28/17, 11:05 AM

## 2017-07-12 NOTE — Evaluation (Addendum)
Occupational Therapy Evaluation Patient Details Name: [name] [name] MRN: [id number] DOB: 1925-07-27 Today's Date: 07/12/2017    History of Present Illness 84yo Male who comes to [facility] on 6/28 after 3day SOB/DOE. Pt admitted with ARF c hypoxia. PMH: HTN, CAD, hypothyroidism, dementia, AF, AAA.    Clinical Impression   Per chart review and pt report, pt lived alone with supervision from daughters and performed BADLs and functional transfers to and from w/c. Pt reporting he attended a day program. No family present to confirm information. Pt currently requiring Min Guard A for UB ADLs, Mod-Max A for LB ADLs, and Min A for squat pivot to recliner. HR elevating to 112 during functional transfer. Pt very agreeable to therapy. Pt would benefit from further acute OT to facilitate safe dc. Recommend dc to SNFfor further OT to optimize safety, independence with ADLs, and return to PLOF.      Follow Up Recommendations  Supervision/Assistance - 24 hour;SNF    Equipment Recommendations  None recommended by OT    Recommendations for Other Services PT consult     Precautions / Restrictions Precautions Precautions: Fall Restrictions Weight Bearing Restrictions: No      Mobility Bed Mobility Overal bed mobility: Needs Assistance Bed Mobility: Supine to Sit     Supine to sit: Min assist     General bed mobility comments: Min A for for elevating trunk. good strength  Transfers Overall transfer level: Needs assistance Equipment used: None Transfers: Squat Pivot Transfers     Squat pivot transfers: Min assist     General transfer comment: Min A for safety. Pt demonstrating good strength and demonstrating sqaut pivot to reclienr to simulated w/c or toilet    Balance Overall balance assessment: Needs assistance Sitting-balance support: Bilateral upper extremity supported Sitting balance-Leahy Scale: Fair Sitting balance - Comments: sitting at EOB with supervision for safety   Standing  balance support: Bilateral upper extremity supported;During functional activity Standing balance-Leahy Scale: Poor Standing balance comment: Reliant on UE support                           ADL either performed or assessed with clinical judgement   ADL Overall ADL's : Needs assistance/impaired Eating/Feeding: Set up;Supervision/ safety;Sitting   Grooming: Brushing hair;Sitting;Min guard Grooming Details (indicate cue type and reason): Sitting at EOB, pt brushed his hair with Min Guard A for safety. demosntrating increased sitting balance.  Upper Body Bathing: Min guard   Lower Body Bathing: Sit to/from stand;Moderate assistance   Upper Body Dressing : Sitting;Min guard   Lower Body Dressing: Maximal assistance;Sit to/from stand Lower Body Dressing Details (indicate cue type and reason): Donned socks at bed level with Max A. Toilet Transfer: Minimal assistance;Squat-pivot(Simulated to recliner)           Functional mobility during ADLs: Minimal assistance(squat pivot to chair) General ADL Comments: Pt highly motivated to return to PLOF. Sitting at EOB, performing grooming tasks, and then squat pivot to recliner. HR elevating to 112 during activity.     Vision         Perception     Praxis      Pertinent Vitals/Pain Pain Assessment: No/denies pain     Hand Dominance Right   Extremity/Trunk Assessment Upper Extremity Assessment Upper Extremity Assessment: Generalized weakness   Lower Extremity Assessment Lower Extremity Assessment: Generalized weakness       Communication Communication Communication: HOH   Cognition Arousal/Alertness: Awake/alert Behavior During Therapy: WFL for tasks  assessed/performed Overall Cognitive Status: Within Functional Limits for tasks assessed(slow speech, slightly slurred, noted mild [place] accent as well, tangential speech. )                                     General Comments  HR 70s at rest.  Elevating to 112 during activity.    Exercises     Shoulder Instructions      Home Living Family/patient expects to be discharged to:: Private residence Living Arrangements: Alone Available Help at Discharge: Family;Available PRN/intermittently Type of Home: House Home Access: Ramped entrance           Bathroom Shower/Tub: Tub/shower unit         Home Equipment: Page - 4 wheels;Wheelchair - [employer];Shower seat;Grab bars - toilet;Hospital bed   Additional Comments: Daughters take turns staying with him at night. [facility] to day program four times a week. No family present to confirm information      Prior Functioning/Environment Level of Independence: Independent with assistive device(s)(3 daughters take turns stayint overnight in home, goes to [facility] program 5d/week. )        Comments: performs dressing/bathing with Modified independence d/t increased effort/time. No family present to confirm information        OT Problem List: Decreased strength;Decreased range of motion;Decreased activity tolerance;Impaired balance (sitting and/or standing);Decreased knowledge of use of DME or AE;Decreased knowledge of precautions      OT Treatment/Interventions: Self-care/ADL training;Therapeutic exercise;Energy conservation;DME and/or AE instruction;Therapeutic activities;Patient/family education    OT Goals(Current goals can be found in the care plan section) Acute Rehab OT Goals Patient Stated Goal: regain strength, return to home eventually OT Goal Formulation: With patient Time For Goal Achievement: 07/26/17 Potential to Achieve Goals: Good ADL Goals Pt Will Perform Upper Body Dressing: sitting;with set-up;with supervision Pt Will Perform Lower Body Dressing: with set-up;with supervision;sit to/from stand;with caregiver independent in assisting Pt Will Transfer to Toilet: with set-up;with supervision;squat pivot transfer;bedside commode Pt Will Perform  Toileting - Clothing Manipulation and hygiene: with set-up;with supervision;sitting/lateral leans Additional ADL Goal #1: Pt will perform bed mobility with supervision in preparation for ADLs  OT Frequency: Min 3X/week   Barriers to D/C:            Co-evaluation              AM-PAC PT "6 Clicks" Daily Activity     Outcome Measure Help from another person eating meals?: A Little Help from another person taking care of personal grooming?: A Little Help from another person toileting, which includes using toliet, bedpan, or urinal?: A Little Help from another person bathing (including washing, rinsing, drying)?: A Lot Help from another person to put on and taking off regular upper body clothing?: A Little Help from another person to put on and taking off regular lower body clothing?: A Lot 6 Click Score: 16   End of Session Equipment Utilized During Treatment: Gait belt;Oxygen Nurse Communication: Mobility status;Precautions  Activity Tolerance: Patient tolerated treatment well Patient left: in chair;with call bell/phone within reach  OT Visit Diagnosis: Unsteadiness on feet (R26.81);Other abnormalities of gait and mobility (R26.89);Muscle weakness (generalized) (M62.81)                Time: 1027-2536 OT Time Calculation (min): 31 min Charges:  OT General Charges $OT Visit: 1 Visit OT Evaluation $OT Eval Moderate Complexity: 1 Mod OT Treatments $Self Care/Home Management :  8-22 mins G-Codes:     [place], OTR/L Acute Rehab Pager: [phone] Office: [place] 07/12/2017, 5:27 PM

## 2017-07-12 NOTE — Progress Notes (Signed)
Pt Potassium 3.3. MD paged to make aware.  Lilla Shook, BSN

## 2017-07-13 DIAGNOSIS — L8990 Pressure ulcer of unspecified site, unspecified stage: Secondary | ICD-10-CM

## 2017-07-13 LAB — BASIC METABOLIC PANEL
Anion gap: 8 (ref 5–15)
BUN: 23 mg/dL (ref 8–23)
CHLORIDE: 103 mmol/L (ref 98–111)
CO2: 29 mmol/L (ref 22–32)
CREATININE: 1.16 mg/dL (ref 0.61–1.24)
Calcium: 8.8 mg/dL — ABNORMAL LOW (ref 8.9–10.3)
GFR calc non Af Amer: 54 mL/min — ABNORMAL LOW (ref >60)
Glucose, Bld: 123 mg/dL — ABNORMAL HIGH (ref 70–99)
POTASSIUM: 3.8 mmol/L (ref 3.5–5.1)
Sodium: 140 mmol/L (ref 135–145)

## 2017-07-13 LAB — CBC
HCT: 44.3 % (ref 39.0–52.0)
Hemoglobin: 14.3 g/dL (ref 13.0–17.0)
MCH: 29.8 pg (ref 26.0–34.0)
MCHC: 32.3 g/dL (ref 30.0–36.0)
MCV: 92.3 fL (ref 78.0–100.0)
PLATELETS: 175 10*3/uL (ref 150–400)
RBC: 4.8 MIL/uL (ref 4.22–5.81)
RDW: 13.6 % (ref 11.5–15.5)
WBC: 10 10*3/uL (ref 4.0–10.5)

## 2017-07-13 LAB — MAGNESIUM: Magnesium: 1.9 mg/dL (ref 1.7–2.4)

## 2017-07-13 MED ORDER — LEVALBUTEROL HCL 1.25 MG/0.5ML IN NEBU
1.2500 mg | INHALATION_SOLUTION | Freq: Four times a day (QID) | RESPIRATORY_TRACT | Status: DC
  Administered 2017-07-13 – 2017-07-15 (×7): 1.25 mg via RESPIRATORY_TRACT
  Filled 2017-07-13 (×9): qty 0.5

## 2017-07-13 MED ORDER — LEVALBUTEROL HCL 1.25 MG/0.5ML IN NEBU: 1.2500 mg | INHALATION_SOLUTION | Freq: Four times a day (QID) | RESPIRATORY_TRACT | Status: DC

## 2017-07-13 MED ORDER — DILTIAZEM HCL ER COATED BEADS 240 MG PO CP24
240.0000 mg | ORAL_CAPSULE | Freq: Every day | ORAL | Status: DC
  Administered 2017-07-14 – 2017-07-16 (×3): 240 mg via ORAL
  Filled 2017-07-13 (×3): qty 1

## 2017-07-13 NOTE — Progress Notes (Signed)
Patient resting in bed and H.R. Down to 30-40 for a few seconds then immed. Back up to 50-60's B.P. Stable se flow sheet . Patient voiced no complaints. Stop Cardizem drip. Skin warm and dry. Cont. To monitor patient and rhythm. R.N. Aware.

## 2017-07-13 NOTE — NC FL2 (Signed)
[place] LEVEL OF CARE SCREENING TOOL     IDENTIFICATION  Patient Name: [name] [name] Birthdate: 1926/01/07 Sex: male Admission Date (Current Location): 07/09/2017  [facility] and [place] Number:  Herbalist and Address:  [facility] [place], [place] [address] [place], [place] [address]      Provider Number: [id number]  Attending Physician Name and Address:  Cherene Altes, MD  Relative Name and Phone Number:  Cheri Fowler [phone]    Current Level of Care: Hospital Recommended Level of Care: [place] Prior Approval Number:    Date Approved/Denied:   PASRR Number: [id number]  Discharge Plan: SNF    Current Diagnoses: Patient Active Problem List   Diagnosis Date Noted  . Pressure injury of skin 07/13/2017  . Atrial fibrillation with RVR ([place]) 07/09/2017  . Acute CHF (congestive heart failure) ([place]) 07/09/2017  . AAA (abdominal aortic aneurysm) ([place]) 07/09/2017  . Acute on chronic diastolic congestive heart failure ([place]) 04/11/2017  . New onset atrial fibrillation ([place]) 04/11/2017  . Acute respiratory failure with hypoxia ([place]) 02/23/2017  . CAP (community acquired pneumonia) 02/23/2017  . Nausea & vomiting 02/23/2017  . PTSD (post-traumatic stress disorder)   . Vomiting and diarrhea 11/09/2014  . Unstable angina ([place]) 05/10/2014  . Coronary artery disease due to lipid rich plaque   . Angina pectoris, crescendo ([place]) 04/28/2014  . Contusion of left hip 07/04/2013  . Fall 07/04/2013  . BPH (benign prostatic hypertrophy) 05/31/2013  . Coronary artery disease of native artery of native heart with stable angina pectoris ([place]) 05/31/2013  . UTI (urinary tract infection) 05/31/2013  . Hypercholesterolemia 05/31/2013  . Dysphagia, pharyngoesophageal phase 04/10/2013  . Frequent falls 04/10/2013  . Weakness 04/10/2013  . Hyperthyroidism, subclinical 04/10/2013  . GERD (gastroesophageal reflux disease) 04/06/2013   . Bladder outflow obstruction 04/06/2013  . Dehydration 04/05/2013  . Essential hypertension 04/05/2013  . Arthritis 04/05/2013  . Peripheral neuropathy 04/05/2013  . Gastroenteritis, acute 04/05/2013  . Gastroenteritis 04/05/2013    Orientation RESPIRATION BLADDER Height & Weight     Self, Time, Situation, Place  O2(2L) Indwelling catheter Weight: 196 lb 6.9 oz (89.1 kg) Height:  5\' 11"  (180.3 cm)  BEHAVIORAL SYMPTOMS/MOOD NEUROLOGICAL BOWEL NUTRITION STATUS      Continent Diet(regular)  AMBULATORY STATUS COMMUNICATION OF NEEDS Skin   Extensive Assist Verbally                         Personal Care Assistance Level of Assistance  Bathing, Feeding, Dressing Bathing Assistance: Maximum assistance Feeding assistance: Limited assistance Dressing Assistance: Limited assistance     Functional Limitations Info  Sight, Hearing, Speech Sight Info: Adequate Hearing Info: Adequate Speech Info: Adequate    SPECIAL CARE FACTORS FREQUENCY  OT (By licensed OT), PT (By licensed PT)     PT Frequency: 5x wk OT Frequency: 5x wk            Contractures Contractures Info: Not present    Additional Factors Info  Code Status, Allergies Code Status Info: DNR Allergies Info: Cortisone           Current Medications (07/13/2017):  This is the current hospital active medication list Current Facility-Administered Medications  Medication Dose Route Frequency Provider Last Rate Last Dose  . 0.9 %  sodium chloride infusion  250 mL Intravenous PRN Wouk, Ailene Rud, MD      . apixaban [facility]) tablet 5 mg  5 mg Oral  BID Gwynne Edinger, MD   5 mg at 07/13/17 1039  . atorvastatin (LIPITOR) tablet 10 mg  10 mg Oral Daily Gwynne Edinger, MD   10 mg at 07/13/17 1040  . dextromethorphan-guaiFENesin (MUCINEX DM) 30-600 MG per 12 hr tablet 1 tablet  1 tablet Oral BID Allie Bossier, MD   1 tablet at 07/13/17 1307  . diltiazem (CARDIZEM) tablet 60 mg  60 mg Oral Q6H Cherene Altes, MD   60 mg at 07/13/17 1307  . finasteride (PROSCAR) tablet 5 mg  5 mg Oral Daily Wouk, Ailene Rud, MD   5 mg at 07/13/17 1039  . lisinopril (PRINIVIL,ZESTRIL) tablet 20 mg  20 mg Oral Daily Wouk, Ailene Rud, MD   20 mg at 07/13/17 1041   And  . hydrochlorothiazide (HYDRODIURIL) tablet 25 mg  25 mg Oral Daily Gwynne Edinger, MD   25 mg at 07/13/17 1039  . isosorbide mononitrate (IMDUR) 24 hr tablet 30 mg  30 mg Oral Daily Gwynne Edinger, MD   30 mg at 07/13/17 1040  . levalbuterol (XOPENEX) nebulizer solution 0.63 mg  0.63 mg Nebulization Q3H PRN Cherene Altes, MD      . levalbuterol Penne Lash) nebulizer solution 1.25 mg  1.25 mg Nebulization Q6H Cherene Altes, MD   1.25 mg at 07/13/17 1356  . loratadine (CLARITIN) tablet 10 mg  10 mg Oral Daily Gwynne Edinger, MD   10 mg at 07/13/17 1039  . Melatonin TABS 3 mg  3 mg Oral QPM Wouk, Ailene Rud, MD   3 mg at 07/12/17 1746  . metoprolol tartrate (LOPRESSOR) tablet 100 mg  100 mg Oral BID Cherene Altes, MD   100 mg at 07/13/17 1040  . pantoprazole (PROTONIX) EC tablet 40 mg  40 mg Oral Daily Gwynne Edinger, MD   40 mg at 07/13/17 1040  . sodium chloride flush (NS) 0.9 % injection 3 mL  3 mL Intravenous Q12H Wouk, Ailene Rud, MD   3 mL at 07/13/17 1044  . sodium chloride flush (NS) 0.9 % injection 3 mL  3 mL Intravenous PRN Wouk, Ailene Rud, MD      . tamsulosin [facility]) capsule 0.4 mg  0.4 mg Oral QPC supper Gwynne Edinger, MD   0.4 mg at 07/12/17 1746  . tiotropium (SPIRIVA) inhalation capsule 18 mcg  18 mcg Inhalation Daily Gwynne Edinger, MD   18 mcg at 07/13/17 0750  . traZODone (DESYREL) tablet 50 mg  50 mg Oral QHS Gwynne Edinger, MD   50 mg at 07/12/17 2149     Discharge Medications: Please see discharge summary for a list of discharge medications.  Relevant Imaging Results:  Relevant Lab Results:   Additional Information SSN [id number]  Wende Neighbors, LCSW

## 2017-07-13 NOTE — Progress Notes (Signed)
H.R. At. Fib. 70-80's V.S.S. Spike with R.N. And will give Cardizem p.o. Does this a.m.

## 2017-07-13 NOTE — Discharge Instructions (Signed)

## 2017-07-13 NOTE — Progress Notes (Addendum)
CCMD called and said that it looked like patient could be in VFib. In the room to assess patient. Patient sitting up in bed laughing with his daughter and RT at bedside. V/S 155/86 and HR 95. EKG done. Dr. Thereasa Solo notified and will continue to monitor.  Emelda Fear, RN

## 2017-07-13 NOTE — Progress Notes (Signed)
[place] TEAM 1 - Stepdown/ICU TEAM  [name] [name]  ZOX:[id number] DOB: 1925/04/26 DOA: 07/09/2017 PCP: [name], MD    Brief Narrative:  84yo M w/ a Hx of PTSD, Mania, Depression, Dementia, HTN, CAD S/P DES to LAD 2016, A. fib on chronic Eliquis, Descending AAA, and Hypothyroidism who presented w/ 3 days of slowly worsening shortness of breath and dyspnea on exertion.   Significant Events: 6/28 admit 6/30 TTE - EF 65-70% - no WMA - mild AoV regurg - mild TR  Subjective: Remains confused today, but perhaps slightly less so.  Denies cp, n/v, or abdom pain.  I spoke w/ his daughter in the hallway at length.    Assessment & Plan:  Acute hypoxic respiratory failure Currently requiring only 2L [place] O2 support  - likely due to viral URI exacerbating COPD - cont current tx and give time   Rhinovirus URI Sx control   Acute COPD exacerbation Steroid stopped 7/1 due to acute delirium - no evidence of acute bacterial infxn and pt has confirmed viral URI therefore stopped abx - cont nebs - no wheezing at time of my exam today   Diastolic CHF Baseline weight ~93kg - no gross overload on exam   Filed Weights   07/11/17 0312 07/12/17 0600 07/13/17 0541  Weight: 94.2 kg (207 lb 10.8 oz) 93.6 kg (206 lb 5.6 oz) 89.1 kg (196 lb 6.9 oz)    A. fib with RVR Rate control at goal w/ pt now off IV gtt - cont Eliquia   Hypokalemia  Supplement to goal of 4.0  HTN BP well controlled at this time   AAA 5.4 cm on recent TTE - would not be a candidate for surgical correction   Hypothyroidism Cont usual med tx   BPH Some acute retention 7/1 - foley placed - begin weaning trials when pt more mobile   Dementia w/ acute delirium Likely due to hospitalization + steroid use - stopped steroids - check reversible causes   Dypshagia  His daughter is a SLP - she requests a bedside speech eval to assure his diet does not need to be adjusted as she has been working w/ him as an outpt and  reports he recently had an upgrade in his diet status following a swallowing evaluation   DVT prophylaxis: eliquis  Code Status: DNR - NO CODE Family Communication:   Disposition Plan: transfer to tele bed   Consultants:  none  Antimicrobials:  Azithromycin 6/28 > 7/1 Rocephin 6/28 > 7/1  Objective: Blood pressure 122/74, pulse 80, temperature 97.8 F (36.6 C), temperature source Oral, resp. rate 18, height 5\' 11"  (1.803 m), weight 89.1 kg (196 lb 6.9 oz), SpO2 97 %.  Intake/Output Summary (Last 24 hours) at 07/13/2017 1417 Last data filed at 07/13/2017 8119 Gross per 24 hour  Intake 434.11 ml  Output 2500 ml  Net -2065.89 ml   Filed Weights   07/11/17 0312 07/12/17 0600 07/13/17 0541  Weight: 94.2 kg (207 lb 10.8 oz) 93.6 kg (206 lb 5.6 oz) 89.1 kg (196 lb 6.9 oz)    Examination: General: No acute respiratory distress - confused  Lungs: CTA B - no wheezing   Cardiovascular: irreg irreg - rate 80 - no M or rub  Abdomen: NT/ND, soft, bowel sounds positive, no appreciable mass Extremities: No signif edema bilateral lower extremities  CBC: Recent Labs  Lab 07/09/17 1821 07/11/17 0919 07/12/17 0415 07/13/17 0314  WBC 9.3 13.4* 10.8* 10.0  NEUTROABS 7.2  --   --   --  HGB 16.1 15.2 14.1 14.3  HCT 49.7 47.4 42.9 44.3  MCV 92.7 92.0 92.5 92.3  PLT 174 175 171 595   Basic Metabolic Panel: Recent Labs  Lab 07/11/17 0919 07/12/17 0415 07/13/17 0314  NA 141 140 140  K 3.7 3.3* 3.8  CL 105 101 103  CO2 28 30 29   GLUCOSE 166* 118* 123*  BUN 19 23 23   CREATININE 1.04 1.05 1.16  CALCIUM 9.2 8.9 8.8*  MG 1.8 1.9 1.9   GFR: Estimated Creatinine Clearance: 45.1 mL/min (by C-G formula based on SCr of 1.16 mg/dL).  Liver Function Tests: Recent Labs  Lab 07/09/17 1821  AST 24  ALT 10  ALKPHOS 96  BILITOT 1.3*  PROT 6.7  ALBUMIN 3.8    Cardiac Enzymes: Recent Labs  Lab 07/09/17 2057 07/10/17 1405 07/10/17 1848  TROPONINI <0.03 <0.03 <0.03    Recent  Results (from the past 240 hour(s))  Culture, blood (routine x 2)     Status: None (Preliminary result)   Collection Time: 07/09/17  8:35 PM  Result Value Ref Range Status   Specimen Description BLOOD RIGHT HAND  Final   Special Requests   Final    BOTTLES DRAWN AEROBIC AND ANAEROBIC Blood Culture adequate volume   Culture   Final    NO GROWTH 4 DAYS Performed at [facility], [place] [address] [place], [place] [address]    Report Status PENDING  Incomplete  Culture, blood (routine x 2)     Status: None (Preliminary result)   Collection Time: 07/09/17  8:49 PM  Result Value Ref Range Status   Specimen Description BLOOD LEFT HAND  Final   Special Requests   Final    BOTTLES DRAWN AEROBIC AND ANAEROBIC Blood Culture adequate volume   Culture   Final    NO GROWTH 4 DAYS Performed at [facility], [place] [address] [place], [place] [address]    Report Status PENDING  Incomplete  Respiratory Panel by PCR     Status: Abnormal   Collection Time: 07/10/17  1:40 PM  Result Value Ref Range Status   Adenovirus NOT DETECTED NOT DETECTED Final   Coronavirus 229E NOT DETECTED NOT DETECTED Final   Coronavirus HKU1 NOT DETECTED NOT DETECTED Final   Coronavirus NL63 NOT DETECTED NOT DETECTED Final   Coronavirus OC43 NOT DETECTED NOT DETECTED Final   Metapneumovirus NOT DETECTED NOT DETECTED Final   Rhinovirus / Enterovirus DETECTED (A) NOT DETECTED Final   Influenza A NOT DETECTED NOT DETECTED Final   Influenza B NOT DETECTED NOT DETECTED Final   Parainfluenza Virus 1 NOT DETECTED NOT DETECTED Final   Parainfluenza Virus 2 NOT DETECTED NOT DETECTED Final   Parainfluenza Virus 3 NOT DETECTED NOT DETECTED Final   Parainfluenza Virus 4 NOT DETECTED NOT DETECTED Final   Respiratory Syncytial Virus NOT DETECTED NOT DETECTED Final   Bordetella pertussis NOT DETECTED NOT DETECTED Final   Chlamydophila pneumoniae NOT DETECTED NOT DETECTED Final   Mycoplasma pneumoniae NOT DETECTED NOT  DETECTED Final    Comment: Performed at [facility], [address] [place], [place] [address]  Culture, sputum-assessment     Status: None   Collection Time: 07/10/17  7:22 PM  Result Value Ref Range Status   Specimen Description SPUTUM  Final   Special Requests NONE  Final   Sputum evaluation   Final    Sputum specimen not acceptable for testing.  Please recollect.   Gram Stain Report  Called to,Read Back By and Verified With: RN Isac Caddy [phone] 2003 MLM Performed at [facility], [place] [address] [place], [place] [address]    Report Status 07/11/2017 FINAL  Final  Culture, blood (routine x 2) Call MD if unable to obtain prior to antibiotics being given     Status: None (Preliminary result)   Collection Time: 07/10/17  9:12 PM  Result Value Ref Range Status   Specimen Description BLOOD LEFT WRIST  Final   Special Requests   Final    BOTTLES DRAWN AEROBIC AND ANAEROBIC Blood Culture adequate volume   Culture   Final    NO GROWTH 3 DAYS Performed at [facility], [place] [address], [place], [place] [address]    Report Status PENDING  Incomplete  Culture, blood (routine x 2) Call MD if unable to obtain prior to antibiotics being given     Status: None (Preliminary result)   Collection Time: 07/10/17  9:12 PM  Result Value Ref Range Status   Specimen Description BLOOD LEFT ANTECUBITAL  Final   Special Requests   Final    BOTTLES DRAWN AEROBIC AND ANAEROBIC Blood Culture adequate volume   Culture   Final    NO GROWTH 3 DAYS Performed at [facility], [place] [address], [place], [place] [address]    Report Status PENDING  Incomplete     Scheduled Meds: . apixaban  5 mg Oral BID  . atorvastatin  10 mg Oral Daily  . dextromethorphan-guaiFENesin  1 tablet Oral BID  . diltiazem  60 mg Oral Q6H  . finasteride  5 mg Oral Daily  . lisinopril  20 mg Oral Daily   And  . hydrochlorothiazide  25 mg Oral Daily  . isosorbide mononitrate  30 mg Oral Daily  .  levalbuterol  1.25 mg Nebulization Q6H  . loratadine  10 mg Oral Daily  . Melatonin  3 mg Oral QPM  . metoprolol tartrate  100 mg Oral BID  . pantoprazole  40 mg Oral Daily  . sodium chloride flush  3 mL Intravenous Q12H  . tamsulosin  0.4 mg Oral QPC supper  . tiotropium  18 mcg Inhalation Daily  . traZODone  50 mg Oral QHS     LOS: 4 days   Cherene Altes, MD [facility] Office  [phone] Pager - Text Page per Amion as per below:  On-Call/Text Page:      [email or web address]      password [email or web address]  If 7PM-7AM, please contact night-coverage [email or web address] Password [email or web address] 07/13/2017, 2:17 PM

## 2017-07-14 ENCOUNTER — Inpatient Hospital Stay (HOSPITAL_COMMUNITY): Payer: Medicare Other

## 2017-07-14 DIAGNOSIS — I10 Essential (primary) hypertension: Secondary | ICD-10-CM

## 2017-07-14 DIAGNOSIS — I25118 Atherosclerotic heart disease of native coronary artery with other forms of angina pectoris: Secondary | ICD-10-CM

## 2017-07-14 DIAGNOSIS — I959 Hypotension, unspecified: Secondary | ICD-10-CM

## 2017-07-14 DIAGNOSIS — I4891 Unspecified atrial fibrillation: Secondary | ICD-10-CM

## 2017-07-14 DIAGNOSIS — I714 Abdominal aortic aneurysm, without rupture: Secondary | ICD-10-CM

## 2017-07-14 DIAGNOSIS — J9601 Acute respiratory failure with hypoxia: Secondary | ICD-10-CM

## 2017-07-14 LAB — COMPREHENSIVE METABOLIC PANEL
ALBUMIN: 2.8 g/dL — AB (ref 3.5–5.0)
ALK PHOS: 71 U/L (ref 38–126)
ALT: 14 U/L (ref 0–44)
AST: 15 U/L (ref 15–41)
Anion gap: 8 (ref 5–15)
BILIRUBIN TOTAL: 0.6 mg/dL (ref 0.3–1.2)
BUN: 26 mg/dL — ABNORMAL HIGH (ref 8–23)
CALCIUM: 8.4 mg/dL — AB (ref 8.9–10.3)
CO2: 27 mmol/L (ref 22–32)
CREATININE: 1.2 mg/dL (ref 0.61–1.24)
Chloride: 104 mmol/L (ref 98–111)
GFR calc Af Amer: 60 mL/min — ABNORMAL LOW (ref >60)
GFR calc non Af Amer: 51 mL/min — ABNORMAL LOW (ref >60)
GLUCOSE: 97 mg/dL (ref 70–99)
Potassium: 3.5 mmol/L (ref 3.5–5.1)
SODIUM: 139 mmol/L (ref 135–145)
Total Protein: 5.2 g/dL — ABNORMAL LOW (ref 6.5–8.1)

## 2017-07-14 LAB — CULTURE, BLOOD (ROUTINE X 2)
Culture: NO GROWTH
Culture: NO GROWTH
SPECIAL REQUESTS: ADEQUATE
Special Requests: ADEQUATE

## 2017-07-14 LAB — HIV ANTIBODY (ROUTINE TESTING W REFLEX): HIV Screen 4th Generation wRfx: NONREACTIVE

## 2017-07-14 LAB — GLUCOSE, CAPILLARY: GLUCOSE-CAPILLARY: 105 mg/dL — AB (ref 70–99)

## 2017-07-14 LAB — CBC
HCT: 42.7 % (ref 39.0–52.0)
Hemoglobin: 13.9 g/dL (ref 13.0–17.0)
MCH: 30 pg (ref 26.0–34.0)
MCHC: 32.6 g/dL (ref 30.0–36.0)
MCV: 92.2 fL (ref 78.0–100.0)
PLATELETS: 183 10*3/uL (ref 150–400)
RBC: 4.63 MIL/uL (ref 4.22–5.81)
RDW: 13.5 % (ref 11.5–15.5)
WBC: 8.1 10*3/uL (ref 4.0–10.5)

## 2017-07-14 LAB — RPR: RPR Ser Ql: NONREACTIVE

## 2017-07-14 LAB — VITAMIN B12: Vitamin B-12: 1118 pg/mL — ABNORMAL HIGH (ref 180–914)

## 2017-07-14 LAB — AMMONIA: Ammonia: 45 umol/L — ABNORMAL HIGH (ref 9–35)

## 2017-07-14 LAB — FOLATE: FOLATE: 6.9 ng/mL (ref >5.9)

## 2017-07-14 MED ORDER — SENNOSIDES-DOCUSATE SODIUM 8.6-50 MG PO TABS
1.0000 | ORAL_TABLET | Freq: Two times a day (BID) | ORAL | Status: DC
  Administered 2017-07-14 – 2017-07-15 (×4): 1 via ORAL
  Filled 2017-07-14 (×4): qty 1

## 2017-07-14 MED ORDER — POTASSIUM CHLORIDE CRYS ER 20 MEQ PO TBCR
40.0000 meq | EXTENDED_RELEASE_TABLET | Freq: Once | ORAL | Status: AC
  Administered 2017-07-14: 40 meq via ORAL
  Filled 2017-07-14: qty 2

## 2017-07-14 MED ORDER — SODIUM CHLORIDE 0.9 % IV BOLUS
500.0000 mL | Freq: Once | INTRAVENOUS | Status: AC
Start: 2017-07-14 — End: 2017-07-14
  Administered 2017-07-14: 500 mL via INTRAVENOUS

## 2017-07-14 MED ORDER — SODIUM CHLORIDE 0.9 % IV SOLN
INTRAVENOUS | Status: DC
  Administered 2017-07-14 – 2017-07-15 (×2): via INTRAVENOUS

## 2017-07-14 MED ORDER — POLYETHYLENE GLYCOL 3350 17 G PO PACK
17.0000 g | PACK | Freq: Two times a day (BID) | ORAL | Status: DC
  Administered 2017-07-14 – 2017-07-15 (×3): 17 g via ORAL
  Filled 2017-07-14 (×4): qty 1

## 2017-07-14 NOTE — Progress Notes (Signed)
Bladder scan done at this time. 0 ml in bladder.

## 2017-07-14 NOTE — Care Management Important Message (Signed)
Important Message  Patient Details  Name: [name] [name] MRN: [id number] Date of Birth: 05/21/1925   Medicare Important Message Given:  Yes Patient unable to sign, unsigned copy left at bedside.   Iviana Blasingame P [place] 07/14/2017, 2:46 PM

## 2017-07-14 NOTE — Progress Notes (Signed)
Manual BP 90/52. CBG 105. Temp. 97.9. Patient asleep, lethagic but responds to voice.

## 2017-07-14 NOTE — Progress Notes (Signed)
Spoke with Dr. Alfredia Ferguson and updated him on patient's status. New orders received.   Emelda Fear, RN

## 2017-07-14 NOTE — Progress Notes (Addendum)
Physical Therapy Treatment Patient Details Name: [name] [name] MRN: [id number] DOB: 25-Sep-1925 Today's Date: 07/14/2017    History of Present Illness 84yo Male who comes to [facility] on 6/28 after 3day SOB/DOE. Pt admitted with ARF c hypoxia. PMH: HTN, CAD, hypothyroidism, dementia, AF, AAA.     PT Comments    Pt received up in chair, recently transfers with [facility] via OT this morning. Pt c/o abd pain, asking for meds from nursing to assist with bowel movement. Pt participating in transfers and LE exercises from chair. Copious rest breaks provided as patient fatigues quickly. ModA required for standing, but once up, bp is relatively stable c VC for standing upright.  HR well controlled in session, increased to low 110s during high exertion. Pt progressing well, but remains somewhat weak.    Follow Up Recommendations  SNF     Equipment Recommendations  None recommended by PT    Recommendations for Other Services       Precautions / Restrictions Precautions Precautions: Fall Restrictions Weight Bearing Restrictions: No    Mobility  Bed Mobility               General bed mobility comments: received up in chair   Transfers Overall transfer level: Needs assistance Equipment used: (dependent style transfer) Transfers: Sit to/from Stand Sit to Stand: Mod assist         General transfer comment: 1 set of three; declines second set d/t fatigue  Ambulation/Gait Ambulation/Gait assistance: (pt does not amb at baseline)               Stairs             Wheelchair Mobility    Modified Rankin (Stroke Patients Only)       Balance Overall balance assessment: Modified Independent Sitting-balance support: Feet supported;No upper extremity supported Sitting balance-Leahy Scale: Good                                      Cognition Arousal/Alertness: Awake/alert Behavior During Therapy: WFL for tasks assessed/performed Overall Cognitive Status:  Within Functional Limits for tasks assessed                                        Exercises Other Exercises Other Exercises: STS from chair 1x3 with modA  Other Exercises: BUE floor touches between feet: 1x3 DC after 3 d/t abd distension/pain Other Exercises: LAQ: 1x10 bilat  Other Exercises: Seated Hip Flexon 1x10 bilat (min-modA provided)     General Comments        Pertinent Vitals/Pain Pain Assessment: Faces Faces Pain Scale: Hurts little more Pain Location: stomach discomfort d/t constipation Pain Intervention(s): Limited activity within patient's tolerance;Monitored during session;Repositioned    Home Living                      Prior Function            PT Goals (current goals can now be found in the care plan section) Acute Rehab PT Goals Patient Stated Goal: regain strength, return to home eventually PT Goal Formulation: With patient Time For Goal Achievement: 07/26/17 Potential to Achieve Goals: Good Progress towards PT goals: Progressing toward goals    Frequency    Min 3X/week      PT Plan Current plan  remains appropriate    Co-evaluation              AM-PAC PT "6 Clicks" Daily Activity  Outcome Measure  Difficulty turning over in bed (including adjusting bedclothes, sheets and blankets)?: Unable Difficulty moving from lying on back to sitting on the side of the bed? : Unable Difficulty sitting down on and standing up from a chair with arms (e.g., wheelchair, bedside commode, etc,.)?: Unable Help needed moving to and from a bed to chair (including a wheelchair)?: Total Help needed walking in hospital room?: Total Help needed climbing 3-5 steps with a railing? : Total 6 Click Score: 6    End of Session Equipment Utilized During Treatment: Oxygen Activity Tolerance: Patient tolerated treatment well;No increased pain;Patient limited by fatigue Patient left: with family/visitor present;with call bell/phone within  reach;in chair;with nursing/sitter in room Nurse Communication: Mobility status PT Visit Diagnosis: Difficulty in walking, not elsewhere classified (R26.2);Muscle weakness (generalized) (M62.81)     Time: 4656-8127 PT Time Calculation (min) (ACUTE ONLY): 19 min  Charges:  $Therapeutic Activity: 8-22 mins                    G Codes:       9:57 AM, 07-28-17 Etta Grandchild, PT, DPT Physical Therapist - [place] [phone] (Pager)  [phone] (Office)      Najmo Pardue C 2017/07/28, 9:55 AM

## 2017-07-14 NOTE — Progress Notes (Signed)
PROGRESS NOTE    [name] [name]  HRC:[id number] DOB: [birth year] DOA: 07/09/2017 PCP: [name], MD   Brief Narrative:  The patient is a 84yo M w/ a Hx of PTSD, Mania, Depression, Dementia, HTN, CAD S/P DES to LAD 2016, A. fib on chronic Eliquis, Descending AAA, Hypothyroidism, and other comorbids who presented w/ 3 days of slowly worsening shortness of breath and dyspnea on exertion.   Assessment & Plan:   Active Problems:   Essential hypertension   Coronary artery disease of native artery of native heart with stable angina pectoris (HCC)   Acute respiratory failure with hypoxia (HCC)   Atrial fibrillation with RVR (HCC)   Acute CHF (congestive heart failure) (HCC)   AAA (abdominal aortic aneurysm) (HCC)   Pressure injury of skin  Acute hypoxic respiratory failure -Currently requiring only 2L [place] O2 support  - likely due to viral URI exacerbating COPD - cont current tx and continue to monitor -Need home O2 screen prior to discharge  Rhinovirus URI -C/w Sx control   Acute COPD exacerbation -Steroid stopped 7/1 due to acute delirium - no evidence of acute bacterial infxn and pt has confirmed viral URI therefore stopped abx - cont nebs - no wheezing again today  -Continue with Xopenex as needed and scheduled  Diastolic CHF -Baseline weight ~93kg - no gross overload on exam  -Strict I's/O's and Daily Weights  -Started IV Hydration with NS at 75 mL/hr given her Hypotension   Hypotension -BP dropped today -Stopped HCTZ -Given Bolus 500 mL -Started NS at 75 mL/hr -Improving   A. fib with RVR Rate control at goal w/ pt now off IV gtt -C/w Diltiazem 60 mg po q6h x 4 doses and then start Diltiazem 240 mg po Daily today -C/w Apixaban 5 mg po BID   Hypokalemia  -Supplement to goal of 4.0  HTN BP well controlled at this time   AAA -5.4 cm on recent TTE - would not be a candidate for surgical correction   Hypothyroidism -Cont usual med tx   BPH -Some  acute retention 7/1 - foley placed - begin weaning trials when pt more mobile   Dementia w/ acute delirium -Likely due to hospitalization + steroid use - stopped steroids - Continue to check for reversible causes   Dypshagia  -His daughter is a SLP - she requests a bedside speech eval to assure his diet does not need to be adjusted as she has been working w/ him as an Charity fundraiser and reports he recently had an upgrade in his diet status following a swallowing evaluation  -SLP done and recommending Regular Diet with Thin Liquids  DVT prophylaxis: Anticoagulated with Apixaban 5 mg po BID Code Status: DO NOT RESUSCITATE  Family Communication: Discussed with Daughter at bedside  Disposition Plan: Anticipate D/C to SNF in next 24-48 hours   Consultants:   None  Procedures:   ECHOCARDIOGRAM ------------------------------------------------------------------- Study Conclusions  - Left ventricle: The cavity size was normal. Wall thickness was   increased in a pattern of mild LVH. Systolic function was   vigorous. The estimated ejection fraction was in the range of 65%   to 70%. Wall motion was normal; there were no regional wall   motion abnormalities. - Aortic valve: There was mild regurgitation. - Aorta: Ascending aortic diameter: 38 mm (S). - Left atrium: The atrium was moderately dilated. - Right atrium: The atrium was mildly dilated. - Pulmonary arteries: Systolic pressure was mildly to moderately   increased. PA peak pressure:  46 mm Hg (S).  Impressions:  - Vigorous LV systolic function; mild LVH; mild AI; mildly dilated   ascending aorta; biatrial enlargement; mild TR with mild to   moderate pulmonary hypertension.   Antimicrobials:  Anti-infectives (From admission, onward)   Start     Dose/Rate Route Frequency Ordered Stop   07/11/17 1800  azithromycin (ZITHROMAX) tablet 250 mg  Status:  Discontinued     250 mg Oral Every 24 hours 07/11/17 1426 07/12/17 1458   07/10/17  2000  cefTRIAXone (ROCEPHIN) 1 g in sodium chloride 0.9 % 100 mL IVPB  Status:  Discontinued     1 g 200 mL/hr over 30 Minutes Intravenous Every 24 hours 07/10/17 1922 07/12/17 1458   07/10/17 2000  azithromycin (ZITHROMAX) 500 mg in sodium chloride 0.9 % 250 mL IVPB  Status:  Discontinued     500 mg 250 mL/hr over 60 Minutes Intravenous Every 24 hours 07/10/17 1922 07/11/17 1424   07/09/17 2245  amoxicillin-clavulanate (AUGMENTIN) 875-125 MG per tablet 1 tablet  Status:  Discontinued     1 tablet Oral Every 12 hours 07/09/17 2235 07/10/17 1922   07/09/17 2000  cefTRIAXone (ROCEPHIN) 1 g in sodium chloride 0.9 % 100 mL IVPB     1 g 200 mL/hr over 30 Minutes Intravenous  Once 07/09/17 1958 07/09/17 2152   07/09/17 2000  azithromycin (ZITHROMAX) tablet 500 mg     500 mg Oral  Once 07/09/17 1958 07/09/17 2051     Subjective: Seen and examined at bedside sitting in the chair.  Denies any nausea, vomiting.  States he is still slightly short of breath.  No lightheadedness or dizziness.  No other concerns or complaints at this time.  Objective: Vitals:   07/13/17 2115 07/14/17 0319 07/14/17 0500 07/14/17 0516  BP: 133/80   (!) 143/85  Pulse:      Resp: (!) 26   (!) 21  Temp: 98.1 F (36.7 C)   98 F (36.7 C)  TempSrc: Oral     SpO2: 94% 94%  93%  Weight:   90.7 kg (199 lb 15.3 oz)   Height:        Intake/Output Summary (Last 24 hours) at 07/14/2017 6834 Last data filed at 07/13/2017 2300 Gross per 24 hour  Intake -  Output 1250 ml  Net -1250 ml   Filed Weights   07/12/17 0600 07/13/17 0541 07/14/17 0500  Weight: 93.6 kg (206 lb 5.6 oz) 89.1 kg (196 lb 6.9 oz) 90.7 kg (199 lb 15.3 oz)   Examination: Physical Exam:  Constitutional: WN/WD Caucasian male in NAD and appears calm and comfortable Eyes:  Lids and conjunctivae normal, sclerae anicteric  ENMT: External Ears, Nose appear normal. Grossly normal hearing. Mucous membranes are moist. Neck: Appears normal, supple, no cervical  masses, normal ROM, no appreciable thyromegaly, no JVD Respiratory: Diminished to auscultation bilaterally, no wheezing, rales, rhonchi or crackles. Normal respiratory effort and patient is not tachypenic. No accessory muscle use.  Cardiovascular: Irregularly Irregular, no murmurs / rubs / gallops. S1 and S2 auscultated. No extremity edema.  Abdomen: Soft, non-tender, non-distended. No masses palpated. No appreciable hepatosplenomegaly. Bowel sounds positive x4.  GU: Deferred. Musculoskeletal: No clubbing / cyanosis of digits/nails. No joint deformity upper and lower extremities.  Skin: No rashes, lesions, ulcers on a limited skin eval. No induration; Warm and dry.  Neurologic: CN 2-12 grossly intact with no focal deficits.  Romberg sign and cerebellar reflexes not assessed.  Psychiatric: Normal judgment and insight. Alert  and oriented x 3. Normal mood and appropriate affect.   Data Reviewed: I have personally reviewed following labs and imaging studies  CBC: Recent Labs  Lab 07/09/17 1821 07/11/17 0919 07/12/17 0415 07/13/17 0314 07/14/17 0339  WBC 9.3 13.4* 10.8* 10.0 8.1  NEUTROABS 7.2  --   --   --   --   HGB 16.1 15.2 14.1 14.3 13.9  HCT 49.7 47.4 42.9 44.3 42.7  MCV 92.7 92.0 92.5 92.3 92.2  PLT 174 175 171 175 993   Basic Metabolic Panel: Recent Labs  Lab 07/10/17 0359 07/11/17 0919 07/12/17 0415 07/13/17 0314 07/14/17 0339  NA 139 141 140 140 139  K 3.2* 3.7 3.3* 3.8 3.5  CL 103 105 101 103 104  CO2 25 28 30 29 27   GLUCOSE 202* 166* 118* 123* 97  BUN 18 19 23 23  26*  CREATININE 1.31* 1.04 1.05 1.16 1.20  CALCIUM 9.0 9.2 8.9 8.8* 8.4*  MG  --  1.8 1.9 1.9  --    GFR: Estimated Creatinine Clearance: 47.2 mL/min (by C-G formula based on SCr of 1.2 mg/dL). Liver Function Tests: Recent Labs  Lab 07/09/17 1821 07/14/17 0339  AST 24 15  ALT 10 14  ALKPHOS 96 71  BILITOT 1.3* 0.6  PROT 6.7 5.2*  ALBUMIN 3.8 2.8*   No results for input(s): LIPASE, AMYLASE in  the last 168 hours. Recent Labs  Lab 07/14/17 0339  AMMONIA 45*   Coagulation Profile: No results for input(s): INR, PROTIME in the last 168 hours. Cardiac Enzymes: Recent Labs  Lab 07/09/17 2057 07/10/17 1405 07/10/17 1848  TROPONINI <0.03 <0.03 <0.03   BNP (last 3 results) No results for input(s): PROBNP in the last 8760 hours. HbA1C: No results for input(s): HGBA1C in the last 72 hours. CBG: No results for input(s): GLUCAP in the last 168 hours. Lipid Profile: No results for input(s): CHOL, HDL, LDLCALC, TRIG, CHOLHDL, LDLDIRECT in the last 72 hours. Thyroid Function Tests: No results for input(s): TSH, T4TOTAL, FREET4, T3FREE, THYROIDAB in the last 72 hours. Anemia Panel: Recent Labs    07/14/17 0339  VITAMINB12 1,118*  FOLATE 6.9   Sepsis Labs: Recent Labs  Lab 07/09/17 1841 07/09/17 2049  LATICACIDVEN 1.30 1.27    Recent Results (from the past 240 hour(s))  Culture, blood (routine x 2)     Status: None (Preliminary result)   Collection Time: 07/09/17  8:35 PM  Result Value Ref Range Status   Specimen Description BLOOD RIGHT HAND  Final   Special Requests   Final    BOTTLES DRAWN AEROBIC AND ANAEROBIC Blood Culture adequate volume   Culture   Final    NO GROWTH 4 DAYS Performed at [facility], [place] [address], [place], [place] [address]    Report Status PENDING  Incomplete  Culture, blood (routine x 2)     Status: None (Preliminary result)   Collection Time: 07/09/17  8:49 PM  Result Value Ref Range Status   Specimen Description BLOOD LEFT HAND  Final   Special Requests   Final    BOTTLES DRAWN AEROBIC AND ANAEROBIC Blood Culture adequate volume   Culture   Final    NO GROWTH 4 DAYS Performed at [facility], [place] [address] [place], [place] [address]    Report Status PENDING  Incomplete  Respiratory Panel by PCR     Status: Abnormal   Collection Time: 07/10/17  1:40 PM  Result Value Ref Range Status  Adenovirus NOT DETECTED NOT  DETECTED Final   Coronavirus 229E NOT DETECTED NOT DETECTED Final   Coronavirus HKU1 NOT DETECTED NOT DETECTED Final   Coronavirus NL63 NOT DETECTED NOT DETECTED Final   Coronavirus OC43 NOT DETECTED NOT DETECTED Final   Metapneumovirus NOT DETECTED NOT DETECTED Final   Rhinovirus / Enterovirus DETECTED (A) NOT DETECTED Final   Influenza A NOT DETECTED NOT DETECTED Final   Influenza B NOT DETECTED NOT DETECTED Final   Parainfluenza Virus 1 NOT DETECTED NOT DETECTED Final   Parainfluenza Virus 2 NOT DETECTED NOT DETECTED Final   Parainfluenza Virus 3 NOT DETECTED NOT DETECTED Final   Parainfluenza Virus 4 NOT DETECTED NOT DETECTED Final   Respiratory Syncytial Virus NOT DETECTED NOT DETECTED Final   Bordetella pertussis NOT DETECTED NOT DETECTED Final   Chlamydophila pneumoniae NOT DETECTED NOT DETECTED Final   Mycoplasma pneumoniae NOT DETECTED NOT DETECTED Final    Comment: Performed at [facility], [place] [address] [place] [address] [place], [place] [address]  Culture, sputum-assessment     Status: None   Collection Time: 07/10/17  7:22 PM  Result Value Ref Range Status   Specimen Description SPUTUM  Final   Special Requests NONE  Final   Sputum evaluation   Final    Sputum specimen not acceptable for testing.  Please recollect.   Gram Stain Report Called to,Read Back By and Verified With: RN Isac Caddy [phone] MLM Performed at [facility], [place] [address], [place], [place] [address]    Report Status 07/11/2017 FINAL  Final  Culture, blood (routine x 2) Call MD if unable to obtain prior to antibiotics being given     Status: None (Preliminary result)   Collection Time: 07/10/17  9:12 PM  Result Value Ref Range Status   Specimen Description BLOOD LEFT WRIST  Final   Special Requests   Final    BOTTLES DRAWN AEROBIC AND ANAEROBIC Blood Culture adequate volume   Culture   Final    NO GROWTH 3 DAYS Performed at [facility], [place] [address], [place], [place] [address]    Report  Status PENDING  Incomplete  Culture, blood (routine x 2) Call MD if unable to obtain prior to antibiotics being given     Status: None (Preliminary result)   Collection Time: 07/10/17  9:12 PM  Result Value Ref Range Status   Specimen Description BLOOD LEFT ANTECUBITAL  Final   Special Requests   Final    BOTTLES DRAWN AEROBIC AND ANAEROBIC Blood Culture adequate volume   Culture   Final    NO GROWTH 3 DAYS Performed at [facility], [place] [address], [place], [place] [address]    Report Status PENDING  Incomplete    Radiology Studies: No results found.  Scheduled Meds: . apixaban  5 mg Oral BID  . atorvastatin  10 mg Oral Daily  . dextromethorphan-guaiFENesin  1 tablet Oral BID  . diltiazem  240 mg Oral Daily  . finasteride  5 mg Oral Daily  . lisinopril  20 mg Oral Daily   And  . hydrochlorothiazide  25 mg Oral Daily  . isosorbide mononitrate  30 mg Oral Daily  . levalbuterol  1.25 mg Nebulization Q6H  . loratadine  10 mg Oral Daily  . Melatonin  3 mg Oral QPM  . metoprolol tartrate  100 mg Oral BID  . pantoprazole  40 mg Oral Daily  . sodium chloride flush  3 mL Intravenous  Q12H  . tamsulosin  0.4 mg Oral QPC supper  . tiotropium  18 mcg Inhalation Daily  . traZODone  50 mg Oral QHS   Continuous Infusions: . sodium chloride      LOS: 5 days   Kerney Elbe, DO [facility] Pager [phone]  If 7PM-7AM, please contact night-coverage [email or web address] Password [email or web address] 07/14/2017, 7:22 AM

## 2017-07-14 NOTE — Progress Notes (Signed)
Family spoke with Chaplain Ray and requested visit from chaplain.  Patient was playing chess with his daughter [name] at time of visit. Was open to receiving prayer and said thank you for visit. Spiritual support and prayer.  Conard Novak, Chaplain   07/14/17 1100  Clinical Encounter Type  Visited With Patient and family together  Visit Type Initial;Spiritual support  Referral From Family  Consult/Referral To Chaplain  Spiritual Encounters  Spiritual Needs Prayer  Stress Factors  Patient Stress Factors Not reviewed  Family Stress Factors Health changes

## 2017-07-14 NOTE — Progress Notes (Addendum)
Dr. Alfredia Ferguson paged at this time and notified of patient's last two BP's. Low compared to previous ones. Will continue to monitor.  Emelda Fear, RN

## 2017-07-14 NOTE — Evaluation (Signed)
Clinical/Bedside Swallow Evaluation Patient Details  Name: [name] [name] MRN: [id number] Date of Birth: 06-26-1925  Today's Date: 07/14/2017 Time: SLP Start Time (ACUTE ONLY): 0902 SLP Stop Time (ACUTE ONLY): 0915 SLP Time Calculation (min) (ACUTE ONLY): 13 min  Past Medical History:  Past Medical History:  Diagnosis Date  . Anginal pain ([place])   . Arthritis    "all over"  . Coronary artery disease   . Depression ~ 39; ~ 1990   PTSD/Mania   . GERD (gastroesophageal reflux disease)   . High cholesterol   . Hypertension   . Hyperthyroidism   . Neuropathy   . Osteoporosis   . Skin cancer of nose    Past Surgical History:  Past Surgical History:  Procedure Laterality Date  . BACK SURGERY    . CARDIAC CATHETERIZATION  09/2007  . CARDIAC CATHETERIZATION N/A 05/08/2014   Procedure: CORONARY STENT INTERVENTION;  Surgeon: [name], MD; 3.518 mm Xience [place] DES to the RCA   . LEFT HEART CATHETERIZATION WITH CORONARY ANGIOGRAM N/A 05/08/2014   Procedure: LEFT HEART CATHETERIZATION WITH CORONARY ANGIOGRAM;  Surgeon: [name], MD; LAD 100% (old), CFX 20%, OM1 30%, pRCA 30%, dRCA 95%, EF 55%     . LUMBAR LAMINECTOMY/DECOMPRESSION MICRODISCECTOMY  2011   HPI:  84 yo admitted after 3 day SOB/dyspnea on exertion. Found to have ARF c hypoxia, COPD exacerbation and rhinovirus URI. PMH: HTN, CAD, pna, GERD, hypothyroidism, dementia, AF, AAA. CXR lower volumes and increased atelectasis compared to yesterday, chronic cardiomegaly and bronchitic markings.  Outpatient MBS for upgrade from mechanical soft restrictions at facility > moderate tongue/vallecular residue, trace silent aspiration during consecutive straw sips from trace pyriform sinus residue spill into airway; reg/thin, no straws recommended. Pt's daughter is an SLP "requesting bedside speech eval to assure his diet does not need to be adjustes as she has ben working with him as an outpatient and reports he recently had an upgrade in  his diet following a swallow evaluation."   Assessment / Plan / Recommendation Clinical Impression  Pt cooperative but not completely receptive to swallow assessment. Daughter stated pt may be mad at her for asking for it and pt stated "I don't want to talk about this swallowing business." Reviewed outpatient MBS results with daughter who reports her sister has "worked with him on taking small sips with the straw and he is doing better." No overt s/s aspiration; respirations are min-mildy effortful at baseline- he takes rest breaks. Mild cues for small sips with straw. No need to modify diet based on observations and pt/daughter also stated they would not want diet changed from regular. Pt can continue regular texture, thin liquids, small sips via straw if desired. Educated pt/dtr to swallow strategies to mitigate his chronic aspiration risk. No f/u needed. Pt's speech moderately dysarthric which daughter reports is baseline.  SLP Visit Diagnosis: Dysphagia, unspecified (R13.10)    Aspiration Risk  Moderate aspiration risk    Diet Recommendation Regular;Thin liquid   Liquid Administration via: Cup;Straw Medication Administration: Other (Comment)(how pt desires) Supervision: Patient able to self feed;Full supervision/cueing for compensatory strategies Compensations: Slow rate;Minimize environmental distractions;Small sips/bites Postural Changes: Seated upright at 90 degrees;Remain upright for at least 30 minutes after po intake    Other  Recommendations Oral Care Recommendations: Oral care BID   Follow up Recommendations None      Frequency and Duration            Prognosis  Swallow Study   General HPI: 84 yo admitted after 3 day SOB/dyspnea on exertion. Found to have ARF c hypoxia, COPD exacerbation and rhinovirus URI. PMH: HTN, CAD, pna, GERD, hypothyroidism, dementia, AF, AAA. CXR lower volumes and increased atelectasis compared to yesterday, chronic cardiomegaly and  bronchitic markings.  Outpatient MBS for upgrade from mechanical soft restrictions at facility > moderate tongue/vallecular residue, trace silent aspiration during consecutive straw sips from trace pyriform sinus residue spill into airway; reg/thin, no straws recommended. Pt's daughter is an SLP "requesting bedside speech eval to assure his diet does not need to be adjustes as she has ben working with him as an outpatient and reports he recently had an upgrade in his diet following a swallow evaluation." Type of Study: Bedside Swallow Evaluation Previous Swallow Assessment: (see HPI) Diet Prior to this Study: Regular;Thin liquids Temperature Spikes Noted: No Respiratory Status: Nasal cannula History of Recent Intubation: No Behavior/Cognition: Alert;Cooperative(HOH) Oral Cavity Assessment: Within Functional Limits Oral Care Completed by SLP: No Oral Cavity - Dentition: Adequate natural dentition Vision: Functional for self-feeding Self-Feeding Abilities: Able to feed self Patient Positioning: Upright in chair Baseline Vocal Quality: Low vocal intensity Volitional Cough: Strong Volitional Swallow: Able to elicit    Oral/Motor/Sensory Function Overall Oral Motor/Sensory Function: Within functional limits   Ice Chips Ice chips: Not tested   Thin Liquid Thin Liquid: Within functional limits Presentation: Straw    Nectar Thick Nectar Thick Liquid: Not tested   Honey Thick Honey Thick Liquid: Not tested   Puree Puree: Within functional limits   Solid   GO   Solid: Within functional limits        [name] [name] 07/14/2017,9:33 AM  [name] [name].Ed [employer] [phone]

## 2017-07-14 NOTE — Progress Notes (Signed)
Dr. Alfredia Ferguson notified of manual BP of 78/50. NS bolus started.

## 2017-07-14 NOTE — Progress Notes (Signed)
Occupational Therapy Treatment Patient Details Name: [name] [name] MRN: [id number] DOB: 05-29-1925 Today's Date: 07/14/2017    History of present illness 84yo Male who comes to [facility] on 6/28 after 3day SOB/DOE. Pt admitted with ARF c hypoxia. PMH: HTN, CAD, hypothyroidism, dementia, AF, AAA.    OT comments  Pt progressing towards established OT goals. Pt performing sit<>Stand with sara stedy in preparation for performing ADLs at sink. Pt performing oral care at sink while seated on sara stedy with Min Guard A for safety. Requiring cues for upright posture. Pt very motivated to use stedy and perform ADLs more independently. HR elevating to 110 during activity. Continue to recommend dc to SNF for further OT and will continue to follow acutely as admitted.    Follow Up Recommendations  Supervision/Assistance - 24 hour;SNF    Equipment Recommendations  None recommended by OT    Recommendations for Other Services PT consult    Precautions / Restrictions Precautions Precautions: Fall Restrictions Weight Bearing Restrictions: No       Mobility Bed Mobility Overal bed mobility: Needs Assistance Bed Mobility: Supine to Sit     Supine to sit: Min assist     General bed mobility comments: Min A for power up and safety with standing. VCs for hand and knee placement  Transfers Overall transfer level: Needs assistance Equipment used: (dependent style transfer) Transfers: Sit to/from Stand Sit to Stand: Min assist         General transfer comment: Min A for power up into standing with sara stedy    Balance Overall balance assessment: Modified Independent Sitting-balance support: Feet supported;No upper extremity supported Sitting balance-Leahy Scale: Good Sitting balance - Comments: sitting at EOB with supervision for safety   Standing balance support: Bilateral upper extremity supported;During functional activity Standing balance-Leahy Scale: Poor Standing balance comment:  Reliant on UE support                           ADL either performed or assessed with clinical judgement   ADL Overall ADL's : Needs assistance/impaired     Grooming: Sitting;Min guard;Oral care;Cueing for sequencing(Seated on sara stedy) Grooming Details (indicate cue type and reason): Pt performing oral care while seated on sara stedy. Pt very motivated to perform grooming task in upright position and participate in therapy. Min Guard A provided for safety               Lower Body Dressing Details (indicate cue type and reason): Pt able to adjust socks by bringing ankle up to recliner seats edge.             Functional mobility during ADLs: Minimal assistance(sit<>stand only with sara stedy) General ADL Comments: Pt hightly motivated to participate in therapy. Pt performing sit<>stand with sara stedy to then transfer over to sink and perform grooming in semi standing position.      Vision       Perception     Praxis      Cognition Arousal/Alertness: Awake/alert Behavior During Therapy: WFL for tasks assessed/performed Overall Cognitive Status: Within Functional Limits for tasks assessed                                 General Comments: Baseline dementia. Presenting with increased awareness this session        Exercises    Shoulder Instructions       General  Comments Daughter present throughout session    Pertinent Vitals/ Pain       Pain Assessment: Faces Faces Pain Scale: No hurt Pain Location: stomach discomfort d/t constipation Pain Intervention(s): Monitored during session  Home Living                                          Prior Functioning/Environment              Frequency  Min 3X/week        Progress Toward Goals  OT Goals(current goals can now be found in the care plan section)  Progress towards OT goals: Progressing toward goals  Acute Rehab OT Goals Patient Stated Goal: regain  strength, return to home eventually OT Goal Formulation: With patient Time For Goal Achievement: 07/26/17 Potential to Achieve Goals: Good ADL Goals Pt Will Perform Upper Body Dressing: sitting;with set-up;with supervision Pt Will Perform Lower Body Dressing: with set-up;with supervision;sit to/from stand;with caregiver independent in assisting Pt Will Transfer to Toilet: with set-up;with supervision;squat pivot transfer;bedside commode Pt Will Perform Toileting - Clothing Manipulation and hygiene: with set-up;with supervision;sitting/lateral leans Additional ADL Goal #1: Pt will perform bed mobility with supervision in preparation for ADLs  Plan Discharge plan remains appropriate    Co-evaluation                 AM-PAC PT "6 Clicks" Daily Activity     Outcome Measure   Help from another person eating meals?: A Little Help from another person taking care of personal grooming?: A Little Help from another person toileting, which includes using toliet, bedpan, or urinal?: A Little Help from another person bathing (including washing, rinsing, drying)?: A Lot Help from another person to put on and taking off regular upper body clothing?: A Little Help from another person to put on and taking off regular lower body clothing?: A Lot 6 Click Score: 16    End of Session Equipment Utilized During Treatment: Gait belt;Oxygen  OT Visit Diagnosis: Unsteadiness on feet (R26.81);Other abnormalities of gait and mobility (R26.89);Muscle weakness (generalized) (M62.81)   Activity Tolerance Patient tolerated treatment well   Patient Left in chair;with call bell/phone within reach   Nurse Communication Mobility status;Precautions        Time: 5993-5701 OT Time Calculation (min): 26 min  Charges: OT General Charges $OT Visit: 1 Visit OT Treatments $Self Care/Home Management : 23-37 mins  Merrill, OTR/L Acute Rehab Pager: [phone] Office: [place] 07/14/2017, 1:05 PM

## 2017-07-14 NOTE — Care Management Note (Signed)
Case Management Note [name] Gibbons RN, BSN Unit 4E-Case Manager [phone]  Patient Details  Name: [name] [name] MRN: [id number] Date of Birth: January 18, 1925  Subjective/Objective:  Pt admitted with Afib and acute COPD exacerbation              Action/Plan: PTA pt lived at home, per PT/OT evals recommendation for STSNF, CSW following for placement needs- looking at [facility] per family request. Call made to daughter [name] [name] regarding transition needs for pt medications- per TC conversation [name] [name] states pt is followed by Dr. Crisoforo [name] at the [facility] at [facility] clinic in [place]. [facility] (no [facility] medic[facility] plan)- will need any new scripts or changes in meds sent to the [place] for PCP to review- fax # [phone].  CM will fax d/c summary to any med changes to the [place] per family request when pt transitions to STSNF. [name] [name] also states that pt is establishing care with local MD with [facility].   Expected Discharge Date:                  Expected Discharge Plan:  [place]  In-House Referral:  Clinical Social Work  Discharge planning Services  CM Consult, Medication Assistance  Post Acute Care Choice:    Choice offered to:     DME Arranged:    DME Agency:     HH Arranged:    [place] Agency:     Status of Service:  In process, will continue to follow  If discussed at Long Length of Stay Meetings, dates discussed:    Discharge Disposition: skilled facility   Additional Comments:  [name] Patricia, RN 07/14/2017, 1:53 PM

## 2017-07-14 NOTE — Clinical Social Work Note (Signed)
Clinical Social Work Assessment  Patient Details  Name: [name] [name] MRN: [id number] Date of Birth: March 09, 1925  Date of referral:  07/14/17               Reason for consult:  Discharge Planning, Facility Placement                Permission sought to share information with:  Family Supports Permission granted to share information::  Yes, Verbal Permission Granted  Name::     Cheri Fowler  Agency::  snf  Relationship::  daughter  Contact Information:  [phone]  Housing/Transportation Living arrangements for the past 2 months:  [place] of Information:  Patient Patient Interpreter Needed:  None Criminal Activity/Legal Involvement Pertinent to Current Situation/Hospitalization:  No - Comment as needed Significant Relationships:  Adult Children, Other Family Members, [place] Lives with:  Self(daughters take turns sleeping at hme with patients) Do you feel safe going back to the place where you live?  Yes Need for family participation in patient care:  Yes (Comment)  Care giving concerns:  Patient lives at home by himself. Patient stated he has 3 daughters and several granddaughters. Patient stated he has support from family. Two out of the 3 daughters were outside the room when CSW was speaking to patient.  Social Worker assessment / plan:  CSW met patient at bedside to discuss discharge plan with patient. Patients youngest daughter [name] [name] was at bedside but patient requested she step out so he could speak with CSW privately. CSW spoke with patient about discharge plan. Patient stated he is not sure about rehab at this time. Patient stated he wants to stay at the hospital for a couple of more days. CSW and patient spoke about his past, family members and topics of patients choosing. CSW asked if she could come later in the afternoon to check on patient. Patient gave verbal permission for CSW to speak to patients daughters, stating "you can talk to [place] only for a little  bit".  CSW met patients daughter [name] [name] and [name] [name] outside patients room. Family stated they are aware that patient is reluctant to make a decision about transition to rehab because they feel that he is afraid he will be pushed out of the hospital. CSW stated that as long as there is a medical reason for patient to be here he will stay. Family stated they would really like patient to transition to [facility] since they know they have a long term bed available if patient needs it. Family extremely supportive of patient  Employment status:  Retired Forensic scientist:  Medicare PT Recommendations:  [place] / Referral to community resources:  [place]  Patient/Family's Response to care:  Patient very vigilant with his care and his care team. Patient knowledgeable of his surroundings  Patient/Family's Understanding of and Emotional Response to Diagnosis, Current Treatment, and Prognosis: Family supportive of patient and they are very involve in his discharge plan. Family has checked out [facility] (SNF). At this time facility will have bed available for patient once medically cleared   Emotional Assessment Appearance:  Appears stated age Attitude/Demeanor/Rapport:  Engaged Affect (typically observed):  Accepting Orientation:  Oriented to Self, Oriented to Place, Oriented to  Time, Oriented to Situation Alcohol / Substance use:  Not Applicable Psych involvement (Current and /or in the community):  No (Comment)  Discharge Needs  Concerns to be addressed:  Care Coordination Readmission within the last 30 days:  No Current  discharge risk:  Dependent with Mobility Barriers to Discharge:  Continued Medical Work up   [employer], LCSW 07/14/2017, 1:51 PM

## 2017-07-14 NOTE — Progress Notes (Signed)
RT placed flutter valve in pt room. Will pass on to day time therapist.

## 2017-07-14 NOTE — Progress Notes (Signed)
Patient's BP now 134/86 and HR 67. Patient more alert and oriented. Will continue to monitor.   Emelda Fear, RN

## 2017-07-15 ENCOUNTER — Inpatient Hospital Stay (HOSPITAL_COMMUNITY): Payer: Medicare Other

## 2017-07-15 LAB — CBC WITH DIFFERENTIAL/PLATELET
ABS IMMATURE GRANULOCYTES: 0.1 10*3/uL (ref 0.0–0.1)
Basophils Absolute: 0 10*3/uL (ref 0.0–0.1)
Basophils Relative: 0 %
Eosinophils Absolute: 0.4 10*3/uL (ref 0.0–0.7)
Eosinophils Relative: 5 %
HEMATOCRIT: 47.5 % (ref 39.0–52.0)
Hemoglobin: 15.2 g/dL (ref 13.0–17.0)
IMMATURE GRANULOCYTES: 1 %
Lymphocytes Relative: 15 %
Lymphs Abs: 1.3 10*3/uL (ref 0.7–4.0)
MCH: 29.6 pg (ref 26.0–34.0)
MCHC: 32 g/dL (ref 30.0–36.0)
MCV: 92.6 fL (ref 78.0–100.0)
MONO ABS: 0.7 10*3/uL (ref 0.1–1.0)
MONOS PCT: 8 %
NEUTROS PCT: 71 %
Neutro Abs: 6.2 10*3/uL (ref 1.7–7.7)
Platelets: 196 10*3/uL (ref 150–400)
RBC: 5.13 MIL/uL (ref 4.22–5.81)
RDW: 13.4 % (ref 11.5–15.5)
WBC: 8.8 10*3/uL (ref 4.0–10.5)

## 2017-07-15 LAB — COMPREHENSIVE METABOLIC PANEL
ALK PHOS: 78 U/L (ref 38–126)
ALT: 17 U/L (ref 0–44)
AST: 14 U/L — ABNORMAL LOW (ref 15–41)
Albumin: 2.9 g/dL — ABNORMAL LOW (ref 3.5–5.0)
Anion gap: 7 (ref 5–15)
BILIRUBIN TOTAL: 1 mg/dL (ref 0.3–1.2)
BUN: 18 mg/dL (ref 8–23)
CALCIUM: 8.8 mg/dL — AB (ref 8.9–10.3)
CO2: 29 mmol/L (ref 22–32)
CREATININE: 1.02 mg/dL (ref 0.61–1.24)
Chloride: 106 mmol/L (ref 98–111)
GFR calc Af Amer: >60 mL/min (ref >60)
Glucose, Bld: 101 mg/dL — ABNORMAL HIGH (ref 70–99)
Potassium: 3.7 mmol/L (ref 3.5–5.1)
Sodium: 142 mmol/L (ref 135–145)
TOTAL PROTEIN: 5.4 g/dL — AB (ref 6.5–8.1)

## 2017-07-15 LAB — CULTURE, BLOOD (ROUTINE X 2)
CULTURE: NO GROWTH
Culture: NO GROWTH
SPECIAL REQUESTS: ADEQUATE
SPECIAL REQUESTS: ADEQUATE

## 2017-07-15 LAB — PHOSPHORUS: Phosphorus: 2.9 mg/dL (ref 2.5–4.6)

## 2017-07-15 LAB — MAGNESIUM: MAGNESIUM: 1.8 mg/dL (ref 1.7–2.4)

## 2017-07-15 MED ORDER — LEVALBUTEROL HCL 1.25 MG/0.5ML IN NEBU
1.2500 mg | INHALATION_SOLUTION | Freq: Three times a day (TID) | RESPIRATORY_TRACT | Status: DC
  Administered 2017-07-15 – 2017-07-16 (×4): 1.25 mg via RESPIRATORY_TRACT
  Filled 2017-07-15 (×4): qty 0.5

## 2017-07-15 MED ORDER — IPRATROPIUM BROMIDE 0.02 % IN SOLN: 0.5000 mg | Freq: Four times a day (QID) | RESPIRATORY_TRACT | Status: DC

## 2017-07-15 MED ORDER — DM-GUAIFENESIN ER 30-600 MG PO TB12
2.0000 | ORAL_TABLET | Freq: Two times a day (BID) | ORAL | Status: DC
  Administered 2017-07-15: 2 via ORAL
  Administered 2017-07-15: 1 via ORAL
  Administered 2017-07-16: 2 via ORAL
  Filled 2017-07-15 (×4): qty 2

## 2017-07-15 MED ORDER — IPRATROPIUM BROMIDE 0.02 % IN SOLN
0.5000 mg | Freq: Three times a day (TID) | RESPIRATORY_TRACT | Status: DC
  Administered 2017-07-15 – 2017-07-16 (×4): 0.5 mg via RESPIRATORY_TRACT
  Filled 2017-07-15 (×4): qty 2.5

## 2017-07-15 NOTE — Plan of Care (Signed)
  Problem: Education: Goal: Knowledge of General Education information will improve Outcome: Progressing   Problem: Health Behavior/Discharge Planning: Goal: Ability to manage health-related needs will improve Outcome: Progressing   Problem: Clinical Measurements: Goal: Will remain free from infection Outcome: Progressing   Problem: Nutrition: Goal: Adequate nutrition will be maintained Outcome: Progressing   Problem: Elimination: Goal: Will not experience complications related to urinary retention Outcome: Not Progressing

## 2017-07-15 NOTE — Progress Notes (Signed)
PROGRESS NOTE    [name] [name]  LGX:[id number] DOB: [birth year] DOA: 07/09/2017 PCP: [name], MD   Brief Narrative:  The patient is a 84yo M w/ a Hx of PTSD, Mania, Depression, Dementia, HTN, CAD S/P DES to LAD 2016, A. fib on chronic Eliquis, Descending AAA, Hypothyroidism, and other comorbids who presented w/ 3 days of slowly worsening shortness of breath and dyspnea on exertion.   Assessment & Plan:   Active Problems:   Essential hypertension   Coronary artery disease of native artery of native heart with stable angina pectoris (HCC)   Acute respiratory failure with hypoxia (HCC)   Atrial fibrillation with RVR (HCC)   Acute CHF (congestive heart failure) (HCC)   AAA (abdominal aortic aneurysm) (HCC)   Pressure injury of skin  Acute hypoxic respiratory failure -Currently requiring only 2L [place] O2 support  - likely due to viral URI exacerbating COPD - cont current tx and continue to monitor -Need home O2 screen prior to discharge and have asked nurse to do that -Urine Strep and Legionella Negative  -Check CT Chest w/o Contrast and showed Continued interstitial thickening in the lung bases could reflect atelectasis or scarring. This is stable since prior CT. 5.1 cm distal descending thoracic aortic aneurysm. Advanced coronary artery disease. -Patient's O2 requirements trending down and was on 1 Liter -C/w Flutter Valve, Incentive Spirometry, and Mucinex-DM  Rhinovirus URI -C/w Sx control   Acute COPD exacerbation -Steroid stopped 7/1 due to acute delirium - no evidence of acute bacterial infxn and pt has confirmed viral URI therefore stopped abx - cont nebs - no wheezing again today  -Continue with Xopenex as needed and scheduled TID   Diastolic CHF -Baseline weight ~93kg - no gross overload on exam  -Strict I's/O's and Daily Weights  -Started IV Hydration with NS at 75 mL/hr given her Hypotension and now will stop -Patient is -7.773 Liters and Weight is now up 6  lbs since admit  Hypotension -BP dropped yesterday  -Stopped HCTZ -Given Bolus 500 mL -Started NS at 75 mL/hr and now will stop -Improving   A. fib with RVR Rate control at goal w/ pt now off IV gtt -Given Diltiazem 60 mg po q6h x 4 doses and then started Diltiazem 240 mg po Daily today -C/w Apixaban 5 mg po BID   Hypokalemia  -Supplement to goal of 4.0  HTN -BP well controlled at this time and is 122/67  AAA -5.4 cm on recent TTE - would not be a candidate for surgical correction -CT Scan shows it to be 5.1   BPH -Some acute retention 7/1 - foley placed - begin weaning trials when pt more mobile  -Will do TOV today   Hypothyroidism -TSH was 0.457 -Not on any Thyroid Supplementation  Dementia w/ acute delirium -Likely due to hospitalization + steroid use - stopped steroids - Continue to check for reversible causes   Dypshagia  -His daughter is a SLP - she requests a bedside speech eval to assure his diet does not need to be adjusted as she has been working w/ him as an Charity fundraiser and reports he recently had an upgrade in his diet status following a swallowing evaluation  -SLP done and recommending Regular Diet with Thin Liquids  DVT prophylaxis: Anticoagulated with Apixaban 5 mg po BID Code Status: DO NOT RESUSCITATE  Family Communication: Discussed with Daughter at bedside  Disposition Plan: Anticipate D/C to SNF in next 24-48 hours if medically stable   Consultants:  None  Procedures:   ECHOCARDIOGRAM ------------------------------------------------------------------- Study Conclusions  - Left ventricle: The cavity size was normal. Wall thickness was   increased in a pattern of mild LVH. Systolic function was   vigorous. The estimated ejection fraction was in the range of 65%   to 70%. Wall motion was normal; there were no regional wall   motion abnormalities. - Aortic valve: There was mild regurgitation. - Aorta: Ascending aortic diameter: 38 mm  (S). - Left atrium: The atrium was moderately dilated. - Right atrium: The atrium was mildly dilated. - Pulmonary arteries: Systolic pressure was mildly to moderately   increased. PA peak pressure: 46 mm Hg (S).  Impressions:  - Vigorous LV systolic function; mild LVH; mild AI; mildly dilated   ascending aorta; biatrial enlargement; mild TR with mild to   moderate pulmonary hypertension.   Antimicrobials:  Anti-infectives (From admission, onward)   Start     Dose/Rate Route Frequency Ordered Stop   07/11/17 1800  azithromycin (ZITHROMAX) tablet 250 mg  Status:  Discontinued     250 mg Oral Every 24 hours 07/11/17 1426 07/12/17 1458   07/10/17 2000  cefTRIAXone (ROCEPHIN) 1 g in sodium chloride 0.9 % 100 mL IVPB  Status:  Discontinued     1 g 200 mL/hr over 30 Minutes Intravenous Every 24 hours 07/10/17 1922 07/12/17 1458   07/10/17 2000  azithromycin (ZITHROMAX) 500 mg in sodium chloride 0.9 % 250 mL IVPB  Status:  Discontinued     500 mg 250 mL/hr over 60 Minutes Intravenous Every 24 hours 07/10/17 1922 07/11/17 1424   07/09/17 2245  amoxicillin-clavulanate (AUGMENTIN) 875-125 MG per tablet 1 tablet  Status:  Discontinued     1 tablet Oral Every 12 hours 07/09/17 2235 07/10/17 1922   07/09/17 2000  cefTRIAXone (ROCEPHIN) 1 g in sodium chloride 0.9 % 100 mL IVPB     1 g 200 mL/hr over 30 Minutes Intravenous  Once 07/09/17 1958 07/09/17 2152   07/09/17 2000  azithromycin (ZITHROMAX) tablet 500 mg     500 mg Oral  Once 07/09/17 1958 07/09/17 2051     Subjective: Seen and examined at bedside and stated he had just had a bowel movement. No chest pain and states shortness of breath is improved.  No lightheadedness or dizziness. States he was doing ok. No other concerns or complaints at this time.   Objective: Vitals:   07/15/17 0723 07/15/17 1219 07/15/17 1411 07/15/17 1645  BP:  136/86  122/67  Pulse:  83  73  Resp:  20  18  Temp:  98.4 F (36.9 C)  98.4 F (36.9 C)   TempSrc:  Oral  Oral  SpO2: 99% 94% 100% 94%  Weight:      Height:        Intake/Output Summary (Last 24 hours) at 07/15/2017 1910 Last data filed at 07/15/2017 1854 Gross per 24 hour  Intake 1164.95 ml  Output 2750 ml  Net -1585.05 ml   Filed Weights   07/13/17 0541 07/14/17 0500 07/15/17 0602  Weight: 89.1 kg (196 lb 6.9 oz) 90.7 kg (199 lb 15.3 oz) 93.5 kg (206 lb 2.1 oz)   Examination: Physical Exam:  Constitutional: Well-developed Caucasian male currently no acute distress is laying in bed appears calm and comfortable Eyes: Lids and conjunctive are normal.  Sclera anicteric ENMT: External ears and nose appear normal.  Grossly normal hearing.  Mucous members are moist Neck: Appears supple with no JVD Respiratory: Diminished to auscultation bilaterally  with no appreciable wheezing, rales, rhonchi.  Unlabored breathing and is wearing 1 L of supplemental O2 via nasal cannula Cardiovascular: Irregularly irregular.  No appreciable murmurs, rubs, gallops.  No lower extremity edema noted Abdomen: Soft, nontender, nondistended.  Bowel sounds present in 4 quadrants GU: Deferred.  Has a indwelling Foley catheter Musculoskeletal: No clubbing or cyanosis.  No joint deformities noted Skin: No appreciable rashes or lesions on limited skin evaluation Neurologic: Cranial nerves II through XII grossly intact no appreciable focal deficits Psychiatric: Normal mood and affect.  Intact judgment insight.  Patient is awake alert and oriented x3  Data Reviewed: I have personally reviewed following labs and imaging studies  CBC: Recent Labs  Lab 07/09/17 1821 07/11/17 0919 07/12/17 0415 07/13/17 0314 07/14/17 0339 07/15/17 0427  WBC 9.3 13.4* 10.8* 10.0 8.1 8.8  NEUTROABS 7.2  --   --   --   --  6.2  HGB 16.1 15.2 14.1 14.3 13.9 15.2  HCT 49.7 47.4 42.9 44.3 42.7 47.5  MCV 92.7 92.0 92.5 92.3 92.2 92.6  PLT 174 175 171 175 183 834   Basic Metabolic Panel: Recent Labs  Lab 07/11/17 0919  07/12/17 0415 07/13/17 0314 07/14/17 0339 07/15/17 0427  NA 141 140 140 139 142  K 3.7 3.3* 3.8 3.5 3.7  CL 105 101 103 104 106  CO2 28 30 29 27 29   GLUCOSE 166* 118* 123* 97 101*  BUN 19 23 23  26* 18  CREATININE 1.04 1.05 1.16 1.20 1.02  CALCIUM 9.2 8.9 8.8* 8.4* 8.8*  MG 1.8 1.9 1.9  --  1.8  PHOS  --   --   --   --  2.9   GFR: Estimated Creatinine Clearance: 56.2 mL/min (by C-G formula based on SCr of 1.02 mg/dL). Liver Function Tests: Recent Labs  Lab 07/09/17 1821 07/14/17 0339 07/15/17 0427  AST 24 15 14*  ALT 10 14 17   ALKPHOS 96 71 78  BILITOT 1.3* 0.6 1.0  PROT 6.7 5.2* 5.4*  ALBUMIN 3.8 2.8* 2.9*   No results for input(s): LIPASE, AMYLASE in the last 168 hours. Recent Labs  Lab 07/14/17 0339  AMMONIA 45*   Coagulation Profile: No results for input(s): INR, PROTIME in the last 168 hours. Cardiac Enzymes: Recent Labs  Lab 07/09/17 2057 07/10/17 1405 07/10/17 1848  TROPONINI <0.03 <0.03 <0.03   BNP (last 3 results) No results for input(s): PROBNP in the last 8760 hours. HbA1C: No results for input(s): HGBA1C in the last 72 hours. CBG: Recent Labs  Lab 07/14/17 1532  GLUCAP 105*   Lipid Profile: No results for input(s): CHOL, HDL, LDLCALC, TRIG, CHOLHDL, LDLDIRECT in the last 72 hours. Thyroid Function Tests: No results for input(s): TSH, T4TOTAL, FREET4, T3FREE, THYROIDAB in the last 72 hours. Anemia Panel: Recent Labs    07/14/17 0339  VITAMINB12 1,118*  FOLATE 6.9   Sepsis Labs: Recent Labs  Lab 07/09/17 1841 07/09/17 2049  LATICACIDVEN 1.30 1.27    Recent Results (from the past 240 hour(s))  Culture, blood (routine x 2)     Status: None   Collection Time: 07/09/17  8:35 PM  Result Value Ref Range Status   Specimen Description BLOOD RIGHT HAND  Final   Special Requests   Final    BOTTLES DRAWN AEROBIC AND ANAEROBIC Blood Culture adequate volume   Culture   Final    NO GROWTH 5 DAYS Performed at [facility], [place] [address], [place], [place] [address]  Report Status 07/14/2017 FINAL  Final  Culture, blood (routine x 2)     Status: None   Collection Time: 07/09/17  8:49 PM  Result Value Ref Range Status   Specimen Description BLOOD LEFT HAND  Final   Special Requests   Final    BOTTLES DRAWN AEROBIC AND ANAEROBIC Blood Culture adequate volume   Culture   Final    NO GROWTH 5 DAYS Performed at [facility], [address], [place], [place] [address]    Report Status 07/14/2017 FINAL  Final  Respiratory Panel by PCR     Status: Abnormal   Collection Time: 07/10/17  1:40 PM  Result Value Ref Range Status   Adenovirus NOT DETECTED NOT DETECTED Final   Coronavirus 229E NOT DETECTED NOT DETECTED Final   Coronavirus HKU1 NOT DETECTED NOT DETECTED Final   Coronavirus NL63 NOT DETECTED NOT DETECTED Final   Coronavirus OC43 NOT DETECTED NOT DETECTED Final   Metapneumovirus NOT DETECTED NOT DETECTED Final   Rhinovirus / Enterovirus DETECTED (A) NOT DETECTED Final   Influenza A NOT DETECTED NOT DETECTED Final   Influenza B NOT DETECTED NOT DETECTED Final   Parainfluenza Virus 1 NOT DETECTED NOT DETECTED Final   Parainfluenza Virus 2 NOT DETECTED NOT DETECTED Final   Parainfluenza Virus 3 NOT DETECTED NOT DETECTED Final   Parainfluenza Virus 4 NOT DETECTED NOT DETECTED Final   Respiratory Syncytial Virus NOT DETECTED NOT DETECTED Final   Bordetella pertussis NOT DETECTED NOT DETECTED Final   Chlamydophila pneumoniae NOT DETECTED NOT DETECTED Final   Mycoplasma pneumoniae NOT DETECTED NOT DETECTED Final    Comment: Performed at [facility], [place] [address], [place], [place] [address]  Culture, sputum-assessment     Status: None   Collection Time: 07/10/17  7:22 PM  Result Value Ref Range Status   Specimen Description SPUTUM  Final   Special Requests NONE  Final   Sputum evaluation   Final    Sputum specimen not acceptable for testing.  Please recollect.   Gram Stain Report Called to,Read  Back By and Verified With: RN Isac Caddy [phone] 2003 MLM Performed at [facility], [place] [address], [place], [place] [address]    Report Status 07/11/2017 FINAL  Final  Culture, blood (routine x 2) Call MD if unable to obtain prior to antibiotics being given     Status: None   Collection Time: 07/10/17  9:12 PM  Result Value Ref Range Status   Specimen Description BLOOD LEFT WRIST  Final   Special Requests   Final    BOTTLES DRAWN AEROBIC AND ANAEROBIC Blood Culture adequate volume   Culture   Final    NO GROWTH 5 DAYS Performed at [facility], [place] [address], [place], [place] [address]    Report Status 07/15/2017 FINAL  Final  Culture, blood (routine x 2) Call MD if unable to obtain prior to antibiotics being given     Status: None   Collection Time: 07/10/17  9:12 PM  Result Value Ref Range Status   Specimen Description BLOOD LEFT ANTECUBITAL  Final   Special Requests   Final    BOTTLES DRAWN AEROBIC AND ANAEROBIC Blood Culture adequate volume   Culture   Final    NO GROWTH 5 DAYS Performed at [facility], [place] [address], [place], [place] [address]    Report Status 07/15/2017 FINAL  Final    Radiology Studies: Ct Chest Wo Contrast  Result Date: 07/15/2017 CLINICAL DATA:  Shortness of breath, weakness EXAM: CT CHEST WITHOUT CONTRAST TECHNIQUE: Multidetector CT imaging of the chest was performed following the standard protocol without IV contrast. COMPARISON:  07/15/2017 chest x-ray.  Chest CT 05/12/2017 FINDINGS: Cardiovascular: Moderate aortic atherosclerosis. Distal descending thoracic aortic aneurysm measures 5.1 cm compared to 5.4 cm previously. Heart is upper limits normal in size. Advanced coronary artery calcifications, most pronounced in the left coronary arteries. Mediastinum/Nodes: No mediastinal, hilar, or axillary adenopathy. Lungs/Pleura: Continued interstitial thickening noted in the lung bases, stable since prior CT. No effusions. Small calcified  granulomas in the right middle lobe and left lower lobe. Upper Abdomen: Imaging into the upper abdomen shows no acute findings. Stable low-density lesions within the visualized liver, likely cysts. Musculoskeletal: Chest wall soft tissues are unremarkable. No acute bony abnormality. IMPRESSION: Continued interstitial thickening in the lung bases could reflect atelectasis or scarring. This is stable since prior CT. 5.1 cm distal descending thoracic aortic aneurysm. Advanced coronary artery disease. Aortic Atherosclerosis (ICD10-I70.0). Electronically Signed   By: Rolm Baptise M.D.   On: 07/15/2017 09:24   Dg Chest Port 1 View  Result Date: 07/15/2017 CLINICAL DATA:  Cough and shortness of breath EXAM: PORTABLE CHEST 1 VIEW COMPARISON:  07/14/2017 FINDINGS: Cardiac shadow remains enlarged. Aortic calcifications are again seen. Bibasilar changes are noted and stable. No new focal infiltrate or effusion is noted. No bony abnormality is seen. IMPRESSION: Stable appearance when compare with the prior day. Electronically Signed   By: Inez Catalina M.D.   On: 07/15/2017 07:23   Dg Chest Port 1 View  Result Date: 07/14/2017 CLINICAL DATA:  Shortness of breath EXAM: PORTABLE CHEST 1 VIEW COMPARISON:  07/10/2017 FINDINGS: Low volume chest with indistinct opacities at the bases that have become more distinct. Opacity in the apical lungs is improved. Cardiomegaly and aortic tortuosity. The descending aorta is dilated by recent CT. IMPRESSION: More discrete streaky opacities at the bases which could be infection or atelectasis. Electronically Signed   By: Monte Fantasia M.D.   On: 07/14/2017 09:25    Scheduled Meds: . apixaban  5 mg Oral BID  . atorvastatin  10 mg Oral Daily  . dextromethorphan-guaiFENesin  2 tablet Oral BID  . diltiazem  240 mg Oral Daily  . finasteride  5 mg Oral Daily  . ipratropium  0.5 mg Nebulization TID  . isosorbide mononitrate  30 mg Oral Daily  . levalbuterol  1.25 mg Nebulization TID   . lisinopril  20 mg Oral Daily  . loratadine  10 mg Oral Daily  . metoprolol tartrate  100 mg Oral BID  . pantoprazole  40 mg Oral Daily  . polyethylene glycol  17 g Oral BID  . senna-docusate  1 tablet Oral BID  . sodium chloride flush  3 mL Intravenous Q12H  . tamsulosin  0.4 mg Oral QPC supper  . traZODone  50 mg Oral QHS   Continuous Infusions: . sodium chloride      LOS: 6 days   Kerney Elbe, DO [facility] Pager [phone]  If 7PM-7AM, please contact night-coverage [email or web address] Password [email or web address] 07/15/2017, 7:10 PM

## 2017-07-16 ENCOUNTER — Telehealth: Payer: Self-pay | Admitting: Cardiovascular Disease

## 2017-07-16 DIAGNOSIS — E161 Other hypoglycemia: Secondary | ICD-10-CM | POA: Diagnosis not present

## 2017-07-16 DIAGNOSIS — N400 Enlarged prostate without lower urinary tract symptoms: Secondary | ICD-10-CM | POA: Diagnosis not present

## 2017-07-16 DIAGNOSIS — G319 Degenerative disease of nervous system, unspecified: Secondary | ICD-10-CM | POA: Diagnosis not present

## 2017-07-16 DIAGNOSIS — M6281 Muscle weakness (generalized): Secondary | ICD-10-CM | POA: Diagnosis not present

## 2017-07-16 DIAGNOSIS — I714 Abdominal aortic aneurysm, without rupture: Secondary | ICD-10-CM | POA: Diagnosis not present

## 2017-07-16 DIAGNOSIS — F4310 Post-traumatic stress disorder, unspecified: Secondary | ICD-10-CM | POA: Diagnosis not present

## 2017-07-16 DIAGNOSIS — F329 Major depressive disorder, single episode, unspecified: Secondary | ICD-10-CM | POA: Diagnosis not present

## 2017-07-16 DIAGNOSIS — M1990 Unspecified osteoarthritis, unspecified site: Secondary | ICD-10-CM | POA: Diagnosis not present

## 2017-07-16 DIAGNOSIS — I480 Paroxysmal atrial fibrillation: Secondary | ICD-10-CM | POA: Diagnosis not present

## 2017-07-16 DIAGNOSIS — R4182 Altered mental status, unspecified: Secondary | ICD-10-CM | POA: Diagnosis not present

## 2017-07-16 DIAGNOSIS — F319 Bipolar disorder, unspecified: Secondary | ICD-10-CM | POA: Diagnosis not present

## 2017-07-16 DIAGNOSIS — M2550 Pain in unspecified joint: Secondary | ICD-10-CM | POA: Diagnosis not present

## 2017-07-16 DIAGNOSIS — F3172 Bipolar disorder, in full remission, most recent episode hypomanic: Secondary | ICD-10-CM | POA: Diagnosis not present

## 2017-07-16 DIAGNOSIS — J449 Chronic obstructive pulmonary disease, unspecified: Secondary | ICD-10-CM | POA: Diagnosis not present

## 2017-07-16 DIAGNOSIS — Z7401 Bed confinement status: Secondary | ICD-10-CM | POA: Diagnosis not present

## 2017-07-16 DIAGNOSIS — R0902 Hypoxemia: Secondary | ICD-10-CM | POA: Diagnosis not present

## 2017-07-16 DIAGNOSIS — E0590 Thyrotoxicosis, unspecified without thyrotoxic crisis or storm: Secondary | ICD-10-CM | POA: Diagnosis not present

## 2017-07-16 DIAGNOSIS — F0280 Dementia in other diseases classified elsewhere without behavioral disturbance: Secondary | ICD-10-CM | POA: Diagnosis not present

## 2017-07-16 DIAGNOSIS — Z955 Presence of coronary angioplasty implant and graft: Secondary | ICD-10-CM | POA: Diagnosis not present

## 2017-07-16 DIAGNOSIS — J9601 Acute respiratory failure with hypoxia: Secondary | ICD-10-CM | POA: Diagnosis not present

## 2017-07-16 DIAGNOSIS — E162 Hypoglycemia, unspecified: Secondary | ICD-10-CM | POA: Diagnosis not present

## 2017-07-16 DIAGNOSIS — G9340 Encephalopathy, unspecified: Secondary | ICD-10-CM | POA: Diagnosis not present

## 2017-07-16 DIAGNOSIS — I2510 Atherosclerotic heart disease of native coronary artery without angina pectoris: Secondary | ICD-10-CM | POA: Diagnosis not present

## 2017-07-16 DIAGNOSIS — I110 Hypertensive heart disease with heart failure: Secondary | ICD-10-CM | POA: Diagnosis not present

## 2017-07-16 DIAGNOSIS — E039 Hypothyroidism, unspecified: Secondary | ICD-10-CM | POA: Diagnosis not present

## 2017-07-16 DIAGNOSIS — J309 Allergic rhinitis, unspecified: Secondary | ICD-10-CM | POA: Diagnosis not present

## 2017-07-16 DIAGNOSIS — I25110 Atherosclerotic heart disease of native coronary artery with unstable angina pectoris: Secondary | ICD-10-CM | POA: Diagnosis not present

## 2017-07-16 DIAGNOSIS — F309 Manic episode, unspecified: Secondary | ICD-10-CM | POA: Diagnosis not present

## 2017-07-16 DIAGNOSIS — K5900 Constipation, unspecified: Secondary | ICD-10-CM | POA: Diagnosis not present

## 2017-07-16 DIAGNOSIS — R262 Difficulty in walking, not elsewhere classified: Secondary | ICD-10-CM | POA: Diagnosis not present

## 2017-07-16 DIAGNOSIS — R339 Retention of urine, unspecified: Secondary | ICD-10-CM | POA: Diagnosis not present

## 2017-07-16 DIAGNOSIS — I5032 Chronic diastolic (congestive) heart failure: Secondary | ICD-10-CM | POA: Diagnosis not present

## 2017-07-16 DIAGNOSIS — Z85828 Personal history of other malignant neoplasm of skin: Secondary | ICD-10-CM | POA: Diagnosis not present

## 2017-07-16 DIAGNOSIS — E559 Vitamin D deficiency, unspecified: Secondary | ICD-10-CM | POA: Diagnosis not present

## 2017-07-16 DIAGNOSIS — I482 Chronic atrial fibrillation: Secondary | ICD-10-CM | POA: Diagnosis not present

## 2017-07-16 DIAGNOSIS — I10 Essential (primary) hypertension: Secondary | ICD-10-CM | POA: Diagnosis not present

## 2017-07-16 DIAGNOSIS — R400 Somnolence: Secondary | ICD-10-CM | POA: Diagnosis not present

## 2017-07-16 DIAGNOSIS — E7800 Pure hypercholesterolemia, unspecified: Secondary | ICD-10-CM | POA: Diagnosis not present

## 2017-07-16 DIAGNOSIS — I5033 Acute on chronic diastolic (congestive) heart failure: Secondary | ICD-10-CM | POA: Diagnosis not present

## 2017-07-16 DIAGNOSIS — H04129 Dry eye syndrome of unspecified lacrimal gland: Secondary | ICD-10-CM | POA: Diagnosis not present

## 2017-07-16 DIAGNOSIS — N401 Enlarged prostate with lower urinary tract symptoms: Secondary | ICD-10-CM | POA: Diagnosis not present

## 2017-07-16 DIAGNOSIS — N179 Acute kidney failure, unspecified: Secondary | ICD-10-CM | POA: Diagnosis not present

## 2017-07-16 DIAGNOSIS — J069 Acute upper respiratory infection, unspecified: Secondary | ICD-10-CM

## 2017-07-16 DIAGNOSIS — I5031 Acute diastolic (congestive) heart failure: Secondary | ICD-10-CM

## 2017-07-16 DIAGNOSIS — J9811 Atelectasis: Secondary | ICD-10-CM | POA: Diagnosis not present

## 2017-07-16 DIAGNOSIS — I6782 Cerebral ischemia: Secondary | ICD-10-CM | POA: Diagnosis not present

## 2017-07-16 DIAGNOSIS — I4891 Unspecified atrial fibrillation: Secondary | ICD-10-CM | POA: Diagnosis not present

## 2017-07-16 DIAGNOSIS — Z66 Do not resuscitate: Secondary | ICD-10-CM | POA: Diagnosis not present

## 2017-07-16 DIAGNOSIS — I25118 Atherosclerotic heart disease of native coronary artery with other forms of angina pectoris: Secondary | ICD-10-CM | POA: Diagnosis not present

## 2017-07-16 DIAGNOSIS — Z87891 Personal history of nicotine dependence: Secondary | ICD-10-CM | POA: Diagnosis not present

## 2017-07-16 DIAGNOSIS — Z7901 Long term (current) use of anticoagulants: Secondary | ICD-10-CM | POA: Diagnosis not present

## 2017-07-16 DIAGNOSIS — K219 Gastro-esophageal reflux disease without esophagitis: Secondary | ICD-10-CM | POA: Diagnosis not present

## 2017-07-16 DIAGNOSIS — R000 Tachycardia, unspecified: Secondary | ICD-10-CM | POA: Diagnosis not present

## 2017-07-16 DIAGNOSIS — G629 Polyneuropathy, unspecified: Secondary | ICD-10-CM | POA: Diagnosis not present

## 2017-07-16 DIAGNOSIS — E538 Deficiency of other specified B group vitamins: Secondary | ICD-10-CM | POA: Diagnosis not present

## 2017-07-16 DIAGNOSIS — E785 Hyperlipidemia, unspecified: Secondary | ICD-10-CM | POA: Diagnosis not present

## 2017-07-16 LAB — CBC WITH DIFFERENTIAL/PLATELET
ABS IMMATURE GRANULOCYTES: 0.1 10*3/uL (ref 0.0–0.1)
Basophils Absolute: 0 10*3/uL (ref 0.0–0.1)
Basophils Relative: 0 %
EOS ABS: 0.4 10*3/uL (ref 0.0–0.7)
Eosinophils Relative: 4 %
HCT: 45.6 % (ref 39.0–52.0)
Hemoglobin: 15.1 g/dL (ref 13.0–17.0)
IMMATURE GRANULOCYTES: 1 %
LYMPHS ABS: 1.3 10*3/uL (ref 0.7–4.0)
Lymphocytes Relative: 14 %
MCH: 30.1 pg (ref 26.0–34.0)
MCHC: 33.1 g/dL (ref 30.0–36.0)
MCV: 91 fL (ref 78.0–100.0)
MONOS PCT: 9 %
Monocytes Absolute: 0.8 10*3/uL (ref 0.1–1.0)
NEUTROS ABS: 6.6 10*3/uL (ref 1.7–7.7)
NEUTROS PCT: 72 %
Platelets: 179 10*3/uL (ref 150–400)
RBC: 5.01 MIL/uL (ref 4.22–5.81)
RDW: 13.2 % (ref 11.5–15.5)
WBC: 9.2 10*3/uL (ref 4.0–10.5)

## 2017-07-16 LAB — COMPREHENSIVE METABOLIC PANEL
ALBUMIN: 2.9 g/dL — AB (ref 3.5–5.0)
ALT: 14 U/L (ref 0–44)
AST: 14 U/L — AB (ref 15–41)
Alkaline Phosphatase: 83 U/L (ref 38–126)
Anion gap: 7 (ref 5–15)
BUN: 17 mg/dL (ref 8–23)
CHLORIDE: 104 mmol/L (ref 98–111)
CO2: 28 mmol/L (ref 22–32)
CREATININE: 1.03 mg/dL (ref 0.61–1.24)
Calcium: 8.7 mg/dL — ABNORMAL LOW (ref 8.9–10.3)
GFR calc Af Amer: >60 mL/min (ref >60)
GLUCOSE: 103 mg/dL — AB (ref 70–99)
POTASSIUM: 3.7 mmol/L (ref 3.5–5.1)
Sodium: 139 mmol/L (ref 135–145)
Total Bilirubin: 0.8 mg/dL (ref 0.3–1.2)
Total Protein: 5.5 g/dL — ABNORMAL LOW (ref 6.5–8.1)

## 2017-07-16 LAB — PHOSPHORUS: Phosphorus: 3 mg/dL (ref 2.5–4.6)

## 2017-07-16 LAB — MAGNESIUM: Magnesium: 1.9 mg/dL (ref 1.7–2.4)

## 2017-07-16 MED ORDER — DM-GUAIFENESIN ER 30-600 MG PO TB12: 2.0000 | ORAL_TABLET | Freq: Two times a day (BID) | ORAL | 0 refills | Status: DC

## 2017-07-16 MED ORDER — POLYETHYLENE GLYCOL 3350 17 G PO PACK: 17.0000 g | PACK | Freq: Two times a day (BID) | ORAL | 0 refills | Status: DC

## 2017-07-16 MED ORDER — SENNOSIDES-DOCUSATE SODIUM 8.6-50 MG PO TABS: 1.0000 | ORAL_TABLET | Freq: Two times a day (BID) | ORAL | Status: DC

## 2017-07-16 MED ORDER — LISINOPRIL 20 MG PO TABS: 20.0000 mg | ORAL_TABLET | Freq: Every day | ORAL | Status: DC

## 2017-07-16 MED ORDER — LEVALBUTEROL HCL 0.63 MG/3ML IN NEBU: 0.6300 mg | INHALATION_SOLUTION | RESPIRATORY_TRACT | 12 refills | Status: DC | PRN

## 2017-07-16 MED ORDER — LEVALBUTEROL HCL 1.25 MG/0.5ML IN NEBU: 1.2500 mg | INHALATION_SOLUTION | Freq: Three times a day (TID) | RESPIRATORY_TRACT | 12 refills | Status: DC

## 2017-07-16 MED ORDER — DILTIAZEM HCL ER COATED BEADS 240 MG PO CP24: 240.0000 mg | ORAL_CAPSULE | Freq: Every day | ORAL | 0 refills | Status: DC

## 2017-07-16 NOTE — Discharge Summary (Signed)
Physician Discharge Summary  [name] [name] TGP:[id number] DOB: 20-Nov-1925 DOA: 07/09/2017  PCP: [name], MD  Admit date: 07/09/2017 Discharge date: 07/16/2017  Admitted From: Home Disposition: SNF  Recommendations for Outpatient Follow-up:  1. Follow up with PCP in 1-2 weeks 2. Follow up with Urology Dr. [name] for Acute Urinary Retention and TOV as an outpatient  3. Follow-up with Cardiology Dr. [name] in the outpatient setting within 1 week from discharge of skilled nursing facility 4. Please obtain CMP/CBC, Mag, Phos in one week 5. Repeat CXR in 3-6 weeks  6. Please follow up on the following pending results:  Home Health: No Equipment/Devices: None    Discharge Condition: Stable CODE STATUS: DO NOT RESUSCITATE  Diet recommendation:   Brief/Interim Summary: The patient is a 84yo M w/ a Hx of PTSD, Mania, Depression, Dementia, HTN, CAD S/P DES to LAD 2016, A. fib on chronic Eliquis, Descending AAA, Hypothyroidism, and other comorbids who presented w/ 3 days of slowly worsening shortness of breath and dyspnea on exertion.  Admitted with acute hypoxic respiratory failure and a viral URI along with atrial fibrillation with RVR.  Hospitalization was complicated with acute urinary retention and will need to follow-up with urology in outpatient setting.  He improved respiratory wise with treatment and was deemed medically stable to be discharged to skilled nursing facility as PT OT recommended SNF for rehab.  He will need to elope with primary care physician, cardiology, neurology in outpatient setting.  Discharge Diagnoses:  Active Problems:   Essential hypertension   Coronary artery disease of native artery of native heart with stable angina pectoris (HCC)   Acute respiratory failure with hypoxia (HCC)   Atrial fibrillation with RVR (HCC)   Acute CHF (congestive heart failure) (HCC)   AAA (abdominal aortic aneurysm) (HCC)   Pressure injury of skin  Acute hypoxic respiratory  failure, improved  -Likely due to viral URI exacerbating COPD - cont current tx and continue to monitor -Need home O2 screen prior to discharge and have asked nurse to do that and since patient does not ambulate O2 was removed and did not Desaturate -Urine Strep and Legionella Negative  -Check CT Chest w/o Contrast and showed Continued interstitial thickening in the lung bases could reflect atelectasis or scarring. This is stable since prior CT. 5.1 cm distal descending thoracic aortic aneurysm. Advanced coronary artery disease. -Patient's O2 requirements trending down and was on 1 Liter yesterday and none today  -C/w Flutter Valve, Incentive Spirometry, and Mucinex-DM -Repeat chest x-ray in 3 to 6 weeks  Rhinovirus URI -C/w Sx control   Acute COPD exacerbation -Steroids stopped7/1due to acute delirium - no evidence of acute bacterial infxn and pt has confirmed viral URIthereforestoppedabx - cont nebs - no wheezing again today  -Continue with Xopenex as needed and scheduled TID  -Follow-up with primary care physician at discharge  Diastolic CHF -Baseline weight ~93kg - no gross overload on exam -Strict I's/O's and Daily Weights  -Started IV Hydration with NS at 75 mL/hr given her Hypotension and now will stop -Patient is -8.053 Liters and Weight is now up 1 lbs since admit -Follow up with Cardiology Dr. [name] as an outpatient   Hypotension -BP dropped on 07/14/17  -Stopped HCTZ -Given Bolus 500 mL -Started IVF Hydration with NS at 75 mL/hr but now stopped  -Improving   A. fib with RVR -Rate controlat goal w/ pt now off IV gtt -Given Diltiazem 60 mg po q6h x 4 doses and then started Diltiazem  240 mg po Daily today -C/w Apixaban 5 mg po BID  -Follow up with Cardiology Dr. [name] as an outpatient   Hypokalemia  -Supplement to goal of 4.0  HTN -BPwell controlled at this timeand is 122/67  AAA -5.4 cm on recent TTE- would not be a candidate  for surgical correction -CT Scan shows it to be 5.1  BPH with Acute Urinary Retention -Some acute retention7/1- foley placed - begin weaning trials when pt more mobile -TOV today and patient unable to void and had >700 mL in Bladder' -Re-insert Foley Cathter and have patient follow up with Dr. [name]    Hypothyroidism -TSH was 0.457 -Not on any Thyroid Supplementation at this time   Dementia w/ acute delirium -Likely due to hospitalization + steroid use - stoppedsteroids -Continue to check for reversible causes  -Improved   Dypshagia  -His daughter is a SLP - she requests a bedside speech eval to assure his diet does not need to be adjusted as she has been working w/ him as an outpt and reports he recently had an upgrade in his diet status following a swallowing evaluation -SLP done and recommending Regular Diet with Thin Liquids  Discharge Instructions Discharge Instructions    (HEART FAILURE PATIENTS) Call MD:  Anytime you have any of the following symptoms: 1) 3 pound weight gain in 24 hours or 5 pounds in 1 week 2) shortness of breath, with or without a dry hacking cough 3) swelling in the hands, feet or stomach 4) if you have to sleep on extra pillows at night in order to breathe.   Complete by:  As directed    Call MD for:  difficulty breathing, headache or visual disturbances   Complete by:  As directed    Call MD for:  extreme fatigue   Complete by:  As directed    Call MD for:  hives   Complete by:  As directed    Call MD for:  persistant dizziness or light-headedness   Complete by:  As directed    Call MD for:  persistant nausea and vomiting   Complete by:  As directed    Call MD for:  redness, tenderness, or signs of infection (pain, swelling, redness, odor or green/yellow discharge around incision site)   Complete by:  As directed    Call MD for:  severe uncontrolled pain   Complete by:  As directed    Call MD for:  temperature >100.4   Complete by:  As  directed    Diet - low sodium heart healthy   Complete by:  As directed    Discharge instructions   Complete by:  As directed    Follow-up with primary care physician, Cardiology, Urology in outpatient setting.  Take all medications as prescribed.  If symptoms change or worsen please return the emergency room for evaluation.   Increase activity slowly   Complete by:  As directed      Allergies as of 07/16/2017      Reactions   Cortisone Other (See Comments)   Causes emotional problems       Medication List    STOP taking these medications   amLODipine 10 MG tablet Commonly known as:  NORVASC   ipratropium-albuterol 0.5-2.5 (3) MG/3ML Soln Commonly known as:  DUONEB   lisinopril-hydrochlorothiazide 20-25 MG tablet Commonly known as:  PRINZIDE,ZESTORETIC   Melatonin 3 MG Tabs     TAKE these medications   acetaminophen 500 MG tablet Commonly known  as:  TYLENOL Take 500-1,000 mg by mouth every 6 (six) hours as needed for mild pain.   apixaban 5 MG Tabs tablet Commonly known as:  ELIQUIS Take 1 tablet (5 mg total) by mouth 2 (two) times daily.   atorvastatin 10 MG tablet Commonly known as:  LIPITOR Take 1 tablet (10 mg total) by mouth daily.   B-12 COMPLIANCE INJECTION 1000 MCG/ML Kit Generic drug:  Cyanocobalamin Inject 1,000 mcg as directed every 30 (thirty) days.   calcium-vitamin D 500-200 MG-UNIT tablet Commonly known as:  OSCAL WITH D Take 1 tablet by mouth 2 (two) times daily.   Carboxymethylcellulose Sod PF 1 % Gel Place 1 drop into both eyes at bedtime.   dextromethorphan-guaiFENesin 30-600 MG 12hr tablet Commonly known as:  MUCINEX DM Take 2 tablets by mouth 2 (two) times daily.   diltiazem 240 MG 24 hr capsule Commonly known as:  CARDIZEM CD Take 1 capsule (240 mg total) by mouth daily. Start taking on:  07/17/2017   finasteride 5 MG tablet Commonly known as:  PROSCAR Take 5 mg by mouth daily.   Fish Oil 1200 MG Caps Take 1,200 mg by mouth  daily.   isosorbide mononitrate 30 MG 24 hr tablet Commonly known as:  IMDUR Take 30 mg by mouth daily.   levalbuterol 1.25 MG/0.5ML nebulizer solution Commonly known as:  XOPENEX Take 1.25 mg by nebulization 3 (three) times daily.   levalbuterol 0.63 MG/3ML nebulizer solution Commonly known as:  XOPENEX Take 3 mLs (0.63 mg total) by nebulization every 3 (three) hours as needed for wheezing.   lisinopril 20 MG tablet Commonly known as:  PRINIVIL,ZESTRIL Take 1 tablet (20 mg total) by mouth daily. Start taking on:  07/17/2017   loratadine 10 MG tablet Commonly known as:  CLARITIN Take 1 tablet (10 mg total) by mouth daily.   metoprolol tartrate 100 MG tablet Commonly known as:  LOPRESSOR Take 100 mg by mouth 2 (two) times daily.   pantoprazole 40 MG tablet Commonly known as:  PROTONIX Take 1 tablet (40 mg total) by mouth daily. What changed:  when to take this   polyethylene glycol packet Commonly known as:  MIRALAX / GLYCOLAX Take 17 g by mouth 2 (two) times daily.   PRESERVISION AREDS 2+MULTI VIT Caps Take 1 capsule by mouth 2 (two) times daily.   senna-docusate 8.6-50 MG tablet Commonly known as:  Senokot-S Take 1 tablet by mouth 2 (two) times daily.   tamsulosin 0.4 MG Caps capsule Commonly known as:  FLOMAX Take 1 capsule (0.4 mg total) by mouth daily after supper.   tiotropium 18 MCG inhalation capsule Commonly known as:  SPIRIVA HANDIHALER Place 1 capsule (18 mcg total) into inhaler and inhale daily.   traZODone 50 MG tablet Commonly known as:  DESYREL Take 50 mg by mouth at bedtime.       Allergies  Allergen Reactions  . Cortisone Other (See Comments)    Causes emotional problems    Consultations:  None   Procedures/Studies: Ct Chest Wo Contrast  Result Date: 07/15/2017 CLINICAL DATA:  Shortness of breath, weakness EXAM: CT CHEST WITHOUT CONTRAST TECHNIQUE: Multidetector CT imaging of the chest was performed following the standard protocol  without IV contrast. COMPARISON:  07/15/2017 chest x-ray.  Chest CT 05/12/2017 FINDINGS: Cardiovascular: Moderate aortic atherosclerosis. Distal descending thoracic aortic aneurysm measures 5.1 cm compared to 5.4 cm previously. Heart is upper limits normal in size. Advanced coronary artery calcifications, most pronounced in the left coronary arteries. Mediastinum/Nodes:  No mediastinal, hilar, or axillary adenopathy. Lungs/Pleura: Continued interstitial thickening noted in the lung bases, stable since prior CT. No effusions. Small calcified granulomas in the right middle lobe and left lower lobe. Upper Abdomen: Imaging into the upper abdomen shows no acute findings. Stable low-density lesions within the visualized liver, likely cysts. Musculoskeletal: Chest wall soft tissues are unremarkable. No acute bony abnormality. IMPRESSION: Continued interstitial thickening in the lung bases could reflect atelectasis or scarring. This is stable since prior CT. 5.1 cm distal descending thoracic aortic aneurysm. Advanced coronary artery disease. Aortic Atherosclerosis (ICD10-I70.0). Electronically Signed   By: Rolm Baptise M.D.   On: 07/15/2017 09:24   Dg Op Swallowing Func-medicare/speech Path  Result Date: 07/01/2017 Objective Swallowing Evaluation: Type of Study: MBS-Modified Barium Swallow Study  Patient Details Name: Hristopher Missildine MRN: [id number] Date of Birth: March 21, 1925 Today's Date: 07/01/2017 Time: SLP Start Time (ACUTE ONLY): 1100 -SLP Stop Time (ACUTE ONLY): 1125 SLP Time Calculation (min) (ACUTE ONLY): 25 min Past Medical History: Past Medical History: Diagnosis Date . Anginal pain ([place])  . Arthritis   "all over" . Coronary artery disease  . Depression ~ 24; ~ 1990  PTSD/Mania  . GERD (gastroesophageal reflux disease)  . High cholesterol  . Hypertension  . Hyperthyroidism  . Neuropathy  . Osteoporosis  . Skin cancer of nose  Past Surgical History: Past Surgical History: Procedure Laterality Date . BACK SURGERY   .  CARDIAC CATHETERIZATION  09/2007 . CARDIAC CATHETERIZATION N/A 05/08/2014  Procedure: CORONARY STENT INTERVENTION;  Surgeon: [name], MD; 3.518 mm Xience Alpine DES to the RCA  . LEFT HEART CATHETERIZATION WITH CORONARY ANGIOGRAM N/A 05/08/2014  Procedure: LEFT HEART CATHETERIZATION WITH CORONARY ANGIOGRAM;  Surgeon: [name], MD; LAD 100% (old), CFX 20%, OM1 30%, pRCA 30%, dRCA 95%, EF 55%    . LUMBAR LAMINECTOMY/DECOMPRESSION MICRODISCECTOMY  2011 HPI: Pt is an 84 year old male arriving for an Outpatient MBS for potential upgrade from mechanical soft restricitons at his day time facility. Per his daughters report, the pt had a recent admission for pna following the flu and was placed on a mechanical soft diet which would not be upgraded without an MBS.  Pt does admit to occasional choking with solids.  No data recorded Assessment / Plan / Recommendation CHL IP CLINICAL IMPRESSIONS 07/01/2017 Clinical Impression Pt demonstrates a mild oropharyngeal dysphagia with mild to moderate base of tongue/vallecular residuals due to decreased epiglottic deflection, though no significant muscular weakness identified. Pt also consistently tilted head forward (not fully tucked) during all swallows, apparently following a prior instruction from SLP. Pt consistently initaited a second swallow to clear residue with liquids. Trace silent aspiration did occur with consecutive straw sips- trace pyriform residue spilled into airway between swallows. Pt ejected with cues. Recommend pt consume solids of choice, following with sips of liquid to clear mild vallecular residue. Thin liquids recommended, no straws. Continue pills whole in puree.  SLP Visit Diagnosis -- Attention and concentration deficit following -- Frontal lobe and executive function deficit following -- Impact on safety and function --   CHL IP TREATMENT RECOMMENDATION 07/01/2017 Treatment Recommendations No treatment recommended at this time   Prognosis 02/25/2017  Prognosis for Safe Diet Advancement Fair Barriers to Reach Goals -- Barriers/Prognosis Comment -- CHL IP DIET RECOMMENDATION 07/01/2017 SLP Diet Recommendations Regular solids;Thin liquid Liquid Administration via Cup;No straw Medication Administration Whole meds with puree Compensations Follow solids with liquid Postural Changes Remain semi-upright after after feeds/meals (Comment);Seated upright at 90 degrees  CHL IP OTHER RECOMMENDATIONS 07/01/2017 Recommended Consults -- Oral Care Recommendations Patient independent with oral care Other Recommendations --   CHL IP FOLLOW UP RECOMMENDATIONS 02/25/2017 Follow up Recommendations Skilled Nursing facility   [facility] IP FREQUENCY AND DURATION 02/25/2017 Speech Therapy Frequency (ACUTE ONLY) min 1 x/week Treatment Duration 1 week;2 weeks      CHL IP ORAL PHASE 07/01/2017 Oral Phase WFL Oral - Pudding Teaspoon -- Oral - Pudding Cup -- Oral - Honey Teaspoon -- Oral - Honey Cup -- Oral - Nectar Teaspoon -- Oral - Nectar Cup -- Oral - Nectar Straw -- Oral - Thin Teaspoon -- Oral - Thin Cup -- Oral - Thin Straw -- Oral - Puree -- Oral - Mech Soft -- Oral - Regular -- Oral - Multi-Consistency -- Oral - Pill -- Oral Phase - Comment --  CHL IP PHARYNGEAL PHASE 07/01/2017 Pharyngeal Phase Impaired Pharyngeal- Pudding Teaspoon -- Pharyngeal -- Pharyngeal- Pudding Cup -- Pharyngeal -- Pharyngeal- Honey Teaspoon -- Pharyngeal -- Pharyngeal- Honey Cup -- Pharyngeal -- Pharyngeal- Nectar Teaspoon -- Pharyngeal -- Pharyngeal- Nectar Cup -- Pharyngeal -- Pharyngeal- Nectar Straw -- Pharyngeal -- Pharyngeal- Thin Teaspoon -- Pharyngeal -- Pharyngeal- Thin Cup WFL;Pharyngeal residue - valleculae;Reduced epiglottic inversion Pharyngeal -- Pharyngeal- Thin Straw Penetration/Aspiration during swallow;Penetration/Apiration after swallow;Trace aspiration;Pharyngeal residue - valleculae;Pharyngeal residue - pyriform;Reduced epiglottic inversion Pharyngeal Material enters airway, passes BELOW cords  without attempt by patient to eject out (silent aspiration) Pharyngeal- Puree Pharyngeal residue - valleculae;Pharyngeal residue - pyriform;Reduced epiglottic inversion Pharyngeal -- Pharyngeal- Mechanical Soft -- Pharyngeal -- Pharyngeal- Regular Pharyngeal residue - valleculae;Reduced epiglottic inversion Pharyngeal -- Pharyngeal- Multi-consistency -- Pharyngeal -- Pharyngeal- Pill Delayed swallow initiation-vallecula Pharyngeal -- Pharyngeal Comment --  No flowsheet data found. No flowsheet data found. Herbie [place], [place] CCC-SLP [phone] Lynann Beaver 07/01/2017, 1:36 PM            CLINICAL DATA:  84 year old male with history of pneumonia. Productive cough. Excessive throat clearing with meals. EXAM: MODIFIED BARIUM SWALLOW TECHNIQUE: Different consistencies of barium were administered orally to the patient by the Speech Pathologist. Imaging of the pharynx was performed in the lateral projection. FLUOROSCOPY TIME:  Fluoroscopy Time:  1 minutes 58 seconds COMPARISON:  None. FINDINGS: Thin liquid-Repeated flash penetration with thin liquids. Minimal aspiration noted during repeated straw sips. Pure- within normal limits Pure with cracker- within normal limits Barium tablet -  within normal limits IMPRESSION: 1. Abnormal swallow study with trace amount of aspiration during straw sips with thin liquid, as above. Please refer to the Speech Pathologists report for complete details and recommendations. Electronically Signed   By: Vinnie Langton M.D.   On: 07/01/2017 12:16   Dg Chest Port 1 View  Result Date: 07/15/2017 CLINICAL DATA:  Cough and shortness of breath EXAM: PORTABLE CHEST 1 VIEW COMPARISON:  07/14/2017 FINDINGS: Cardiac shadow remains enlarged. Aortic calcifications are again seen. Bibasilar changes are noted and stable. No new focal infiltrate or effusion is noted. No bony abnormality is seen. IMPRESSION: Stable appearance when compare with the prior day. Electronically Signed   By: Inez Catalina M.D.   On: 07/15/2017 07:23   Dg Chest Port 1 View  Result Date: 07/14/2017 CLINICAL DATA:  Shortness of breath EXAM: PORTABLE CHEST 1 VIEW COMPARISON:  07/10/2017 FINDINGS: Low volume chest with indistinct opacities at the bases that have become more distinct. Opacity in the apical lungs is improved. Cardiomegaly and aortic tortuosity. The descending aorta is dilated by recent CT. IMPRESSION: More discrete streaky opacities at the  bases which could be infection or atelectasis. Electronically Signed   By: Monte Fantasia M.D.   On: 07/14/2017 09:25   Dg Chest Port 1 View  Result Date: 07/10/2017 CLINICAL DATA:  Shortness of breath and wheezing EXAM: PORTABLE CHEST 1 VIEW COMPARISON:  07/09/2017 FINDINGS: Chronic cardiomegaly and vascular pedicle widening. Low lung volumes, worsened from before. Increased streaky opacity. Generalized interstitial coarsening, chronic. No Kerley lines, effusion, or bronchograms. IMPRESSION: 1. Lower volumes and increased atelectasis compared to yesterday. 2. Chronic cardiomegaly and bronchitic markings. Electronically Signed   By: Monte Fantasia M.D.   On: 07/10/2017 14:27   Dg Chest Portable 1 View  Result Date: 07/09/2017 CLINICAL DATA:  Shortness of breath for several days EXAM: PORTABLE CHEST 1 VIEW COMPARISON:  05/04/2017 FINDINGS: Cardiac shadow is enlarged but stable. Aortic calcifications are again seen. The lungs are well aerated bilaterally with mild interstitial changes of a chronic nature. No focal infiltrate or sizable effusion is seen. No acute bony abnormality is noted. IMPRESSION: Chronic changes without acute abnormality. Electronically Signed   By: Inez Catalina M.D.   On: 07/09/2017 18:53   ECHOCARDIOGRAM ------------------------------------------------------------------- Study Conclusions  - Left ventricle: The cavity size was normal. Wall thickness was increased in a pattern of mild LVH. Systolic function was vigorous. The estimated  ejection fraction was in the range of 65% to 70%. Wall motion was normal; there were no regional wall motion abnormalities. - Aortic valve: There was mild regurgitation. - Aorta: Ascending aortic diameter: 38 mm (S). - Left atrium: The atrium was moderately dilated. - Right atrium: The atrium was mildly dilated. - Pulmonary arteries: Systolic pressure was mildly to moderately increased. PA peak pressure: 46 mm Hg (S).  Impressions:  - Vigorous LV systolic function; mild LVH; mild AI; mildly dilated ascending aorta; biatrial enlargement; mild TR with mild to moderate pulmonary hypertension.  Subjective: Seen and examined at bedside was doing well.  Felt his shortness breath is improved.  No chest pain, shortness breath, lightheadedness or dizziness.  Still felt weak.  No other complaints or concerns at this time.  Discharge Exam: Vitals:   07/16/17 0736 07/16/17 0813  BP:  (!) 162/98  Pulse:  93  Resp:  20  Temp:  98.8 F (37.1 C)  SpO2: 94% 95%   Vitals:   07/15/17 2005 07/16/17 0512 07/16/17 0736 07/16/17 0813  BP: 136/78 (!) 160/75  (!) 162/98  Pulse: 83 78  93  Resp: (!) 23 18  20   Temp: 98.3 F (36.8 C) 98.3 F (36.8 C)  98.8 F (37.1 C)  TempSrc: Oral Oral  Oral  SpO2: 94% 95% 94% 95%  Weight:  91.5 kg (201 lb 11.5 oz)    Height:       General: Pt is alert, awake, not in acute distress Cardiovascular: Irregularly Irregular and slightly tachycardic, S1/S2 +, no rubs, no gallops Respiratory: Diminished bilaterally, no wheezing, no rhonchi Abdominal: Soft, NT, ND, bowel sounds + Extremities: no appreciable LE edema, no cyanosis  The results of significant diagnostics from this hospitalization (including imaging, microbiology, ancillary and laboratory) are listed below for reference.    Microbiology: Recent Results (from the past 240 hour(s))  Culture, blood (routine x 2)     Status: None   Collection Time: 07/09/17  8:35 PM  Result Value Ref Range  Status   Specimen Description BLOOD RIGHT HAND  Final   Special Requests   Final    BOTTLES DRAWN AEROBIC AND ANAEROBIC Blood Culture adequate volume  Culture   Final    NO GROWTH 5 DAYS Performed at [facility], [place] [address], [place], [place] [address]    Report Status 07/14/2017 FINAL  Final  Culture, blood (routine x 2)     Status: None   Collection Time: 07/09/17  8:49 PM  Result Value Ref Range Status   Specimen Description BLOOD LEFT HAND  Final   Special Requests   Final    BOTTLES DRAWN AEROBIC AND ANAEROBIC Blood Culture adequate volume   Culture   Final    NO GROWTH 5 DAYS Performed at [facility], [place] [address], [place], [place] [address]    Report Status 07/14/2017 FINAL  Final  Respiratory Panel by PCR     Status: Abnormal   Collection Time: 07/10/17  1:40 PM  Result Value Ref Range Status   Adenovirus NOT DETECTED NOT DETECTED Final   Coronavirus 229E NOT DETECTED NOT DETECTED Final   Coronavirus HKU1 NOT DETECTED NOT DETECTED Final   Coronavirus NL63 NOT DETECTED NOT DETECTED Final   Coronavirus OC43 NOT DETECTED NOT DETECTED Final   Metapneumovirus NOT DETECTED NOT DETECTED Final   Rhinovirus / Enterovirus DETECTED (A) NOT DETECTED Final   Influenza A NOT DETECTED NOT DETECTED Final   Influenza B NOT DETECTED NOT DETECTED Final   Parainfluenza Virus 1 NOT DETECTED NOT DETECTED Final   Parainfluenza Virus 2 NOT DETECTED NOT DETECTED Final   Parainfluenza Virus 3 NOT DETECTED NOT DETECTED Final   Parainfluenza Virus 4 NOT DETECTED NOT DETECTED Final   Respiratory Syncytial Virus NOT DETECTED NOT DETECTED Final   Bordetella pertussis NOT DETECTED NOT DETECTED Final   Chlamydophila pneumoniae NOT DETECTED NOT DETECTED Final   Mycoplasma pneumoniae NOT DETECTED NOT DETECTED Final    Comment: Performed at [facility], [place] [address], [place], [place] [address]  Culture, sputum-assessment     Status: None   Collection Time: 07/10/17  7:22 PM   Result Value Ref Range Status   Specimen Description SPUTUM  Final   Special Requests NONE  Final   Sputum evaluation   Final    Sputum specimen not acceptable for testing.  Please recollect.   Gram Stain Report Called to,Read Back By and Verified With: RN Isac Caddy [phone] 2005 MLM Performed at [facility], [place] [address], [place], [place] [address]    Report Status 07/11/2017 FINAL  Final  Culture, blood (routine x 2) Call MD if unable to obtain prior to antibiotics being given     Status: None   Collection Time: 07/10/17  9:12 PM  Result Value Ref Range Status   Specimen Description BLOOD LEFT WRIST  Final   Special Requests   Final    BOTTLES DRAWN AEROBIC AND ANAEROBIC Blood Culture adequate volume   Culture   Final    NO GROWTH 5 DAYS Performed at [facility], [place] [address], [place], [place] [address]    Report Status 07/15/2017 FINAL  Final  Culture, blood (routine x 2) Call MD if unable to obtain prior to antibiotics being given     Status: None   Collection Time: 07/10/17  9:12 PM  Result Value Ref Range Status   Specimen Description BLOOD LEFT ANTECUBITAL  Final   Special Requests   Final    BOTTLES DRAWN AEROBIC AND ANAEROBIC Blood Culture adequate volume   Culture   Final    NO GROWTH 5 DAYS Performed at [facility], [place] [address] [place], [place] [address]    Report Status 07/15/2017 FINAL  Final    Labs: BNP (last 3 results) Recent Labs    02/23/17 1231 07/09/17 1835  BNP 91.3 143.8*   Basic Metabolic Panel: Recent Labs  Lab 07/11/17 0919 07/12/17 0415 07/13/17 0314 07/14/17 0339 07/15/17 0427 07/16/17 0343  NA 141 140 140 139 142 139  K 3.7 3.3* 3.8 3.5 3.7 3.7  CL 105 101 103 104 106 104  CO2 28 30 29 27 29 28   GLUCOSE 166* 118* 123* 97 101* 103*  BUN 19 23 23  26* 18 17  CREATININE 1.04 1.05 1.16 1.20 1.02 1.03  CALCIUM 9.2 8.9 8.8* 8.4* 8.8* 8.7*  MG 1.8 1.9 1.9  --  1.8 1.9  PHOS  --   --   --   --  2.9 3.0   Liver  Function Tests: Recent Labs  Lab 07/09/17 1821 07/14/17 0339 07/15/17 0427 07/16/17 0343  AST 24 15 14* 14*  ALT 10 14 17 14   ALKPHOS 96 71 78 83  BILITOT 1.3* 0.6 1.0 0.8  PROT 6.7 5.2* 5.4* 5.5*  ALBUMIN 3.8 2.8* 2.9* 2.9*   No results for input(s): LIPASE, AMYLASE in the last 168 hours. Recent Labs  Lab 07/14/17 0339  AMMONIA 45*   CBC: Recent Labs  Lab 07/09/17 1821  07/12/17 0415 07/13/17 0314 07/14/17 0339 07/15/17 0427 07/16/17 0343  WBC 9.3   < > 10.8* 10.0 8.1 8.8 9.2  NEUTROABS 7.2  --   --   --   --  6.2 6.6  HGB 16.1   < > 14.1 14.3 13.9 15.2 15.1  HCT 49.7   < > 42.9 44.3 42.7 47.5 45.6  MCV 92.7   < > 92.5 92.3 92.2 92.6 91.0  PLT 174   < > 171 175 183 196 179   < > = values in this interval not displayed.   Cardiac Enzymes: Recent Labs  Lab 07/09/17 2057 07/10/17 1405 07/10/17 1848  TROPONINI <0.03 <0.03 <0.03   BNP: Invalid input(s): POCBNP CBG: Recent Labs  Lab 07/14/17 1532  GLUCAP 105*   D-Dimer No results for input(s): DDIMER in the last 72 hours. Hgb A1c No results for input(s): HGBA1C in the last 72 hours. Lipid Profile No results for input(s): CHOL, HDL, LDLCALC, TRIG, CHOLHDL, LDLDIRECT in the last 72 hours. Thyroid function studies No results for input(s): TSH, T4TOTAL, T3FREE, THYROIDAB in the last 72 hours.  Invalid input(s): FREET3 Anemia work up Recent Labs    07/14/17 0339  VITAMINB12 1,118*  FOLATE 6.9   Urinalysis    Component Value Date/Time   COLORURINE YELLOW 07/09/2017 1900   APPEARANCEUR CLEAR 07/09/2017 1900   LABSPEC 1.014 07/09/2017 1900   PHURINE 6.0 07/09/2017 1900   GLUCOSEU NEGATIVE 07/09/2017 1900   HGBUR NEGATIVE 07/09/2017 1900   BILIRUBINUR NEGATIVE 07/09/2017 1900   KETONESUR NEGATIVE 07/09/2017 1900   PROTEINUR NEGATIVE 07/09/2017 1900   UROBILINOGEN 0.2 11/09/2014 1532   NITRITE NEGATIVE 07/09/2017 1900   LEUKOCYTESUR NEGATIVE 07/09/2017 1900   Sepsis Labs Invalid input(s):  PROCALCITONIN,  WBC,  LACTICIDVEN Microbiology Recent Results (from the past 240 hour(s))  Culture, blood (routine x 2)     Status: None   Collection Time: 07/09/17  8:35 PM  Result Value Ref Range Status   Specimen Description BLOOD RIGHT HAND  Final   Special Requests   Final    BOTTLES DRAWN AEROBIC AND ANAEROBIC Blood Culture  adequate volume   Culture   Final    NO GROWTH 5 DAYS Performed at [facility], [place] [address], [place], [place] [address]    Report Status 07/14/2017 FINAL  Final  Culture, blood (routine x 2)     Status: None   Collection Time: 07/09/17  8:49 PM  Result Value Ref Range Status   Specimen Description BLOOD LEFT HAND  Final   Special Requests   Final    BOTTLES DRAWN AEROBIC AND ANAEROBIC Blood Culture adequate volume   Culture   Final    NO GROWTH 5 DAYS Performed at [facility], [name] [address], [place], [place] [address]    Report Status 07/14/2017 FINAL  Final  Respiratory Panel by PCR     Status: Abnormal   Collection Time: 07/10/17  1:40 PM  Result Value Ref Range Status   Adenovirus NOT DETECTED NOT DETECTED Final   Coronavirus 229E NOT DETECTED NOT DETECTED Final   Coronavirus HKU1 NOT DETECTED NOT DETECTED Final   Coronavirus NL63 NOT DETECTED NOT DETECTED Final   Coronavirus OC43 NOT DETECTED NOT DETECTED Final   Metapneumovirus NOT DETECTED NOT DETECTED Final   Rhinovirus / Enterovirus DETECTED (A) NOT DETECTED Final   Influenza A NOT DETECTED NOT DETECTED Final   Influenza B NOT DETECTED NOT DETECTED Final   Parainfluenza Virus 1 NOT DETECTED NOT DETECTED Final   Parainfluenza Virus 2 NOT DETECTED NOT DETECTED Final   Parainfluenza Virus 3 NOT DETECTED NOT DETECTED Final   Parainfluenza Virus 4 NOT DETECTED NOT DETECTED Final   Respiratory Syncytial Virus NOT DETECTED NOT DETECTED Final   Bordetella pertussis NOT DETECTED NOT DETECTED Final   Chlamydophila pneumoniae NOT DETECTED NOT DETECTED Final   Mycoplasma pneumoniae  NOT DETECTED NOT DETECTED Final    Comment: Performed at [facility], [place] [address] [place], [place] [address]  Culture, sputum-assessment     Status: None   Collection Time: 07/10/17  7:22 PM  Result Value Ref Range Status   Specimen Description SPUTUM  Final   Special Requests NONE  Final   Sputum evaluation   Final    Sputum specimen not acceptable for testing.  Please recollect.   Gram Stain Report Called to,Read Back By and Verified With: RN Isac Caddy [phone] 2005 MLM Performed at [facility], [place] [address] [place], [place] [address]    Report Status 07/11/2017 FINAL  Final  Culture, blood (routine x 2) Call MD if unable to obtain prior to antibiotics being given     Status: None   Collection Time: 07/10/17  9:12 PM  Result Value Ref Range Status   Specimen Description BLOOD LEFT WRIST  Final   Special Requests   Final    BOTTLES DRAWN AEROBIC AND ANAEROBIC Blood Culture adequate volume   Culture   Final    NO GROWTH 5 DAYS Performed at [facility], [place] [address] [place], [place] [address]    Report Status 07/15/2017 FINAL  Final  Culture, blood (routine x 2) Call MD if unable to obtain prior to antibiotics being given     Status: None   Collection Time: 07/10/17  9:12 PM  Result Value Ref Range Status   Specimen Description BLOOD LEFT ANTECUBITAL  Final   Special Requests   Final    BOTTLES DRAWN AEROBIC AND ANAEROBIC Blood Culture adequate volume   Culture   Final    NO GROWTH 5 DAYS  Performed at [facility], [place] [address] [place], [place] [address]    Report Status 07/15/2017 FINAL  Final   Time coordinating discharge: 35 minutes  SIGNED:  Kerney Elbe, DO [facility] 07/16/2017, 12:33 PM Pager [phone]  If 7PM-7AM, please contact night-coverage [email or web address] Password [email or web address]

## 2017-07-16 NOTE — Telephone Encounter (Signed)
Returned call to daughter with MD advice. She was appreciative of call and states her father was seen by MD in the hospital which they were thankful for. She would like to cancel his 7/8 appointment and will call back to r/s for 3-4 months.

## 2017-07-16 NOTE — Telephone Encounter (Signed)
New Message:      Pt's daughter is calling to see if the pt should cancel his appt for Monday since he is just getting discharged today to rehab. Pt's daughter states that Dr. Loletha Grayer wanted to see the pt in a week after he has been in rehab for a while.

## 2017-07-16 NOTE — Progress Notes (Signed)
Removed oxygen from patient to check O2 sats. Patient sustaining 92-95% on room air. Will continue to monitor

## 2017-07-16 NOTE — Progress Notes (Signed)
Patient unable to void after removal of catheter at 1830. Bladder scan done at 0230. Showed 723. Order for in and out cath once completed - 450 out.

## 2017-07-16 NOTE — Progress Notes (Signed)
Physical Therapy Treatment Patient Details Name: [name] [name] MRN: [id number] DOB: 10-Oct-1925 Today's Date: 07/16/2017    History of Present Illness 84yo Male who comes to [facility] on 6/28 after 3day SOB/DOE. Pt admitted with ARF c hypoxia. PMH: HTN, CAD, hypothyroidism, dementia, AF, AAA.     PT Comments    Pt admitted with above diagnosis. Pt currently with functional limitations due to balance and endurance deficits.  Pt was able to take a few pivotal steps to recliner from the bed with mod assist of 2 persons.  Pt unsteady and decr coordination of UEs overall.  Daughter states he had some decr coordination in bil UEs PTA however she states it is currently worse.  Pt did tolerate a little more activity today than last session.  Pt will benefit from skilled PT to increase their independence and safety with mobility to allow discharge to the venue listed below.     Follow Up Recommendations  SNF     Equipment Recommendations  None recommended by PT    Recommendations for Other Services       Precautions / Restrictions Precautions Precautions: Fall Restrictions Weight Bearing Restrictions: No    Mobility  Bed Mobility Overal bed mobility: Needs Assistance Bed Mobility: Supine to Sit     Supine to sit: Mod assist     General bed mobility comments: Assist for LEs and to elevate trunk.  Assist to scoot to EOB and steady on the bed.   Transfers Overall transfer level: Needs assistance Equipment used: Rolling walker (2 wheeled) Transfers: Sit to/from [facility] Sit to Stand: Mod assist;+2 physical assistance;From elevated surface Stand pivot transfers: Mod assist;+2 physical assistance;From elevated surface       General transfer comment: Mod A for power up into standing to RW.  Pt was able to take pivotal steps around to chair with bil knee instability noted.  Cues for technique and sequencing steps and RW.   Ambulation/Gait Ambulation/Gait assistance: (pt does  not amb at baseline)               Chief Strategy Officer    Modified Rankin (Stroke Patients Only)       Balance Overall balance assessment: Modified Independent Sitting-balance support: Feet supported;No upper extremity supported Sitting balance-Leahy Scale: Good Sitting balance - Comments: sitting at EOB with supervision to min guard assist  for safety   Standing balance support: Bilateral upper extremity supported;During functional activity Standing balance-Leahy Scale: Poor Standing balance comment: Reliant on UE support of RW and mod external support                             Cognition Arousal/Alertness: Awake/alert Behavior During Therapy: WFL for tasks assessed/performed Overall Cognitive Status: Within Functional Limits for tasks assessed                                 General Comments: Baseline dementia.       Exercises General Exercises - Lower Extremity Ankle Circles/Pumps: AROM;Both;10 reps;Supine Quad Sets: AROM;Both;10 reps;Supine Long Arc Quad: AROM;Both;10 reps;Seated Hip Flexion/Marching: AROM;Both;10 reps;Seated    General Comments        Pertinent Vitals/Pain Pain Assessment: No/denies pain  VSS  Home Living  Prior Function            PT Goals (current goals can now be found in the care plan section) Acute Rehab PT Goals Patient Stated Goal: regain strength, return to home eventually Progress towards PT goals: Progressing toward goals    Frequency    Min 2X/week      PT Plan Frequency needs to be updated    Co-evaluation              AM-PAC PT "6 Clicks" Daily Activity  Outcome Measure  Difficulty turning over in bed (including adjusting bedclothes, sheets and blankets)?: Unable Difficulty moving from lying on back to sitting on the side of the bed? : Unable Difficulty sitting down on and standing up from a chair with arms (e.g.,  wheelchair, bedside commode, etc,.)?: A Lot Help needed moving to and from a bed to chair (including a wheelchair)?: A Lot Help needed walking in hospital room?: Total Help needed climbing 3-5 steps with a railing? : Total 6 Click Score: 8    End of Session Equipment Utilized During Treatment: Gait belt Activity Tolerance: Patient limited by fatigue Patient left: with call bell/phone within reach;in chair;with chair alarm set Nurse Communication: Mobility status;Need for lift equipment PT Visit Diagnosis: Difficulty in walking, not elsewhere classified (R26.2);Muscle weakness (generalized) (M62.81);Unsteadiness on feet (R26.81)     Time: 1010-1033 PT Time Calculation (min) (ACUTE ONLY): 23 min  Charges:  $Therapeutic Exercise: 8-22 mins $Therapeutic Activity: 8-22 mins                    G Codes:       Skylene Deremer,PT Acute Rehabilitation [phone] (pager)    Denice Paradise 07/16/2017, 11:38 AM

## 2017-07-16 NOTE — NC FL2 (Signed)
[place] LEVEL OF CARE SCREENING TOOL     IDENTIFICATION  Patient Name: [name] [name] Birthdate: 12/25/1925 Sex: male Admission Date (Current Location): 07/09/2017  [facility] and [place] Number:  Herbalist and Address:  [facility], [place] [address], [place], [place] [address]      Provider Number: [id number]  Attending Physician Name and Address:  Kerney Elbe, DO  Relative Name and Phone Number:  Cheri Fowler [phone]    Current Level of Care: Hospital Recommended Level of Care: [place] Prior Approval Number:    Date Approved/Denied:   PASRR Number: [id number]  Discharge Plan: SNF    Current Diagnoses: Patient Active Problem List   Diagnosis Date Noted  . Pressure injury of skin 07/13/2017  . Atrial fibrillation with RVR ([place]) 07/09/2017  . Acute CHF (congestive heart failure) ([place]) 07/09/2017  . AAA (abdominal aortic aneurysm) ([place]) 07/09/2017  . Acute on chronic diastolic congestive heart failure ([place]) 04/11/2017  . New onset atrial fibrillation ([place]) 04/11/2017  . Acute respiratory failure with hypoxia ([place]) 02/23/2017  . CAP (community acquired pneumonia) 02/23/2017  . Nausea & vomiting 02/23/2017  . PTSD (post-traumatic stress disorder)   . Vomiting and diarrhea 11/09/2014  . Unstable angina ([place]) 05/10/2014  . Coronary artery disease due to lipid rich plaque   . Angina pectoris, crescendo ([place]) 04/28/2014  . Contusion of left hip 07/04/2013  . Fall 07/04/2013  . BPH (benign prostatic hypertrophy) 05/31/2013  . Coronary artery disease of native artery of native heart with stable angina pectoris ([place]) 05/31/2013  . UTI (urinary tract infection) 05/31/2013  . Hypercholesterolemia 05/31/2013  . Dysphagia, pharyngoesophageal phase 04/10/2013  . Frequent falls 04/10/2013  . Weakness 04/10/2013  . Hyperthyroidism, subclinical 04/10/2013  . GERD (gastroesophageal reflux disease) 04/06/2013   . Bladder outflow obstruction 04/06/2013  . Dehydration 04/05/2013  . Essential hypertension 04/05/2013  . Arthritis 04/05/2013  . Peripheral neuropathy 04/05/2013  . Gastroenteritis, acute 04/05/2013  . Gastroenteritis 04/05/2013    Orientation RESPIRATION BLADDER Height & Weight     Self, Time, Situation, Place  Normal Indwelling catheter Weight: 201 lb 11.5 oz (91.5 kg) Height:  5\' 11"  (180.3 cm)  BEHAVIORAL SYMPTOMS/MOOD NEUROLOGICAL BOWEL NUTRITION STATUS      Continent Diet(regular)  AMBULATORY STATUS COMMUNICATION OF NEEDS Skin   Extensive Assist Verbally                         Personal Care Assistance Level of Assistance  Bathing, Feeding, Dressing Bathing Assistance: Maximum assistance Feeding assistance: Limited assistance Dressing Assistance: Limited assistance     Functional Limitations Info  Sight, Hearing, Speech Sight Info: Adequate Hearing Info: Adequate Speech Info: Adequate    SPECIAL CARE FACTORS FREQUENCY  OT (By licensed OT), PT (By licensed PT)     PT Frequency: 5x wk OT Frequency: 5x wk            Contractures Contractures Info: Not present    Additional Factors Info  Code Status, Allergies Code Status Info: DNR Allergies Info: Cortisone           Current Medications (07/16/2017):  This is the current hospital active medication list Current Facility-Administered Medications  Medication Dose Route Frequency Provider Last Rate Last Dose  . 0.9 %  sodium chloride infusion  250 mL Intravenous PRN Wouk, Ailene Rud, MD      . apixaban [facility]) tablet 5 mg  5 mg Oral  BID Gwynne Edinger, MD   5 mg at 07/16/17 4650  . atorvastatin (LIPITOR) tablet 10 mg  10 mg Oral Daily Gwynne Edinger, MD   10 mg at 07/16/17 0836  . dextromethorphan-guaiFENesin (MUCINEX DM) 30-600 MG per 12 hr tablet 2 tablet  2 tablet Oral BID Raiford Noble [place], DO   2 tablet at 07/16/17 [phone]  . diltiazem (CARDIZEM CD) 24 hr capsule 240 mg  240 mg Oral  Daily Cherene Altes, MD   240 mg at 07/16/17 0837  . finasteride (PROSCAR) tablet 5 mg  5 mg Oral Daily Wouk, Ailene Rud, MD   5 mg at 07/16/17 [phone]  . ipratropium (ATROVENT) nebulizer solution 0.5 mg  0.5 mg Nebulization TID Raiford Noble Latif, DO   0.5 mg at 07/16/17 1305  . isosorbide mononitrate (IMDUR) 24 hr tablet 30 mg  30 mg Oral Daily Gwynne Edinger, MD   30 mg at 07/16/17 0836  . levalbuterol (XOPENEX) nebulizer solution 0.63 mg  0.63 mg Nebulization Q3H PRN Cherene Altes, MD      . levalbuterol Penne Lash) nebulizer solution 1.25 mg  1.25 mg Nebulization TID Raiford Noble Latif, DO   1.25 mg at 07/16/17 1305  . lisinopril (PRINIVIL,ZESTRIL) tablet 20 mg  20 mg Oral Daily Gwynne Edinger, MD   20 mg at 07/16/17 0835  . loratadine (CLARITIN) tablet 10 mg  10 mg Oral Daily Gwynne Edinger, MD   10 mg at 07/16/17 0836  . metoprolol tartrate (LOPRESSOR) tablet 100 mg  100 mg Oral BID Cherene Altes, MD   100 mg at 07/16/17 0836  . pantoprazole (PROTONIX) EC tablet 40 mg  40 mg Oral Daily Gwynne Edinger, MD   40 mg at 07/16/17 2751  . polyethylene glycol (MIRALAX / GLYCOLAX) packet 17 g  17 g Oral BID Raiford Noble [place], DO   17 g at 07/15/17 2133  . senna-docusate (Senokot-S) tablet 1 tablet  1 tablet Oral BID Raiford Noble [place], DO   1 tablet at 07/15/17 2133  . sodium chloride flush (NS) 0.9 % injection 3 mL  3 mL Intravenous Q12H Wouk, Ailene Rud, MD   3 mL at 07/16/17 [phone]  . sodium chloride flush (NS) 0.9 % injection 3 mL  3 mL Intravenous PRN Wouk, Ailene Rud, MD      . tamsulosin [facility]) capsule 0.4 mg  0.4 mg Oral QPC supper Gwynne Edinger, MD   0.4 mg at 07/15/17 1741  . traZODone (DESYREL) tablet 50 mg  50 mg Oral QHS Gwynne Edinger, MD   50 mg at 07/15/17 2133     Discharge Medications: Please see discharge summary for a list of discharge medications.  Relevant Imaging Results:  Relevant Lab Results:   Additional Information SSN  [id number]  Wende Neighbors, LCSW

## 2017-07-16 NOTE — Telephone Encounter (Signed)
I saw Mr. [name] [name] in the hospital. He is recovering from what appears to have been a viral pulmonary infection. Evidence of congestive heart failure exacerbation.  Tolerating atrial fibrillation without any symptoms, good rate control. Cardiac issues all appear to be well addressed and he does not need an immediate follow-up appointment.  Okay to reschedule in 3-4 months.

## 2017-07-16 NOTE — Care Management Note (Signed)
Case Management Note Marvetta Gibbons RN, BSN Unit 4E-Case Manager [phone]  Patient Details  Name: [name] [name] MRN: [id number] Date of Birth: 29-Jul-1925  Subjective/Objective:  Pt admitted with Afib and acute COPD exacerbation              Action/Plan: PTA pt lived at home, per PT/OT evals recommendation for STSNF, CSW following for placement needs- looking at [facility] per family request. Call made to daughter [name] regarding transition needs for pt medications- per TC conversation [name] states pt is followed by Dr. Crisoforo Oxford at the [facility] clinic in [place]. Pt gets all meds via the [facility] (no part D medicare plan)- will need any new scripts or changes in meds sent to the [place] for PCP to review- fax # [phone].  CM will fax d/c summary to any med changes to the [place] per family request when pt transitions to STSNF. [name] also states that pt is establishing care with local MD with [place].   Expected Discharge Date:  07/16/17               Expected Discharge Plan:  [place]  In-House Referral:  Clinical Social Work  Discharge planning Services  CM Consult, Medication Assistance  Post Acute Care Choice:    Choice offered to:     DME Arranged:    DME Agency:     HH Arranged:    [place] Agency:     Status of Service:  Completed, signed off  If discussed at [employer], dates discussed:    Discharge Disposition: skilled facility   Additional Comments:  07/16/17- 1330- [place] Postlewaite RN, CM- pt for transition to STSNF today, have faxed d/c summary to pt's [place] clinic per family request for PCP to review medication changes. No further CM needs noted. CSW following for STSNF placement  [name], RN 07/16/2017, 1:36 PM

## 2017-07-16 NOTE — Progress Notes (Signed)
Report called to RN at [facility]. All question answered.

## 2017-07-16 NOTE — Progress Notes (Signed)
Clinical Social Worker facilitated patient discharge including contacting patient family and facility to confirm patient discharge plans.  Clinical information faxed to facility and family agreeable with plan.  CSW arranged ambulance transport via [place] to [facility] .  RN to call [phone] (ask for RN that has room 36) for report prior to discharge.  Clinical Social Worker will sign off for now as social work intervention is no longer needed. Please consult [place] again if new need arises.  Rhea Pink, MSW, [place]

## 2017-07-16 NOTE — Telephone Encounter (Signed)
Returned call to daughter [name]. She states patient is being discharged to rehab today. She reports that MD told them that if patient went to rehab again, he would like to see patient about 1 week after he is there and then f/up. Patient is scheduled to see MD on Monday. Advised that if patient is able, he should keep this appointment, as availability for future appointments cannot be guaranteed and may be limited. She also asked about Suanne Marker PA schedule but she is not in the office the week of July 15-19 per daughter request. She plans to have him keep this appointment.   She also would like MD to review patient's hospital course. She asked if Dr. Loletha Grayer saw patient in hospital and I advised that it does not appear that cardiology was consulted. She asked if they could be in the future and I told her that would be decided upon by the admitting medical team but they could express interest in this to the doctor/nurses.   Will route to MD per daughter request.

## 2017-07-16 NOTE — Plan of Care (Signed)
  Problem: Education: Goal: Knowledge of General Education information will improve Outcome: Progressing   Problem: Health Behavior/Discharge Planning: Goal: Ability to manage health-related needs will improve Outcome: Progressing   Problem: Clinical Measurements: Goal: Will remain free from infection Outcome: Progressing   Problem: Activity: Goal: Risk for activity intolerance will decrease Outcome: Progressing   Problem: Safety: Goal: Ability to remain free from injury will improve Outcome: Progressing

## 2017-07-19 ENCOUNTER — Ambulatory Visit: Payer: Medicare Other | Admitting: Cardiovascular Disease

## 2017-07-20 DIAGNOSIS — I5032 Chronic diastolic (congestive) heart failure: Secondary | ICD-10-CM | POA: Diagnosis not present

## 2017-07-20 DIAGNOSIS — J449 Chronic obstructive pulmonary disease, unspecified: Secondary | ICD-10-CM | POA: Diagnosis not present

## 2017-07-20 DIAGNOSIS — I480 Paroxysmal atrial fibrillation: Secondary | ICD-10-CM | POA: Diagnosis not present

## 2017-07-20 DIAGNOSIS — F3172 Bipolar disorder, in full remission, most recent episode hypomanic: Secondary | ICD-10-CM | POA: Diagnosis not present

## 2017-07-27 DIAGNOSIS — N401 Enlarged prostate with lower urinary tract symptoms: Secondary | ICD-10-CM | POA: Diagnosis not present

## 2017-07-27 DIAGNOSIS — R339 Retention of urine, unspecified: Secondary | ICD-10-CM | POA: Diagnosis not present

## 2017-07-29 ENCOUNTER — Observation Stay (HOSPITAL_COMMUNITY)
Admission: EM | Admit: 2017-07-29 | Discharge: 2017-08-01 | Disposition: A | Discharge status: Skilled Nursing Facility | Payer: Medicare Other | Attending: Family Medicine | Admitting: Family Medicine

## 2017-07-29 ENCOUNTER — Emergency Department (HOSPITAL_COMMUNITY): Payer: Medicare Other

## 2017-07-29 ENCOUNTER — Encounter (HOSPITAL_COMMUNITY): Payer: Self-pay | Admitting: Emergency Medicine

## 2017-07-29 DIAGNOSIS — Z955 Presence of coronary angioplasty implant and graft: Secondary | ICD-10-CM | POA: Insufficient documentation

## 2017-07-29 DIAGNOSIS — F0391 Unspecified dementia with behavioral disturbance: Secondary | ICD-10-CM

## 2017-07-29 DIAGNOSIS — J449 Chronic obstructive pulmonary disease, unspecified: Secondary | ICD-10-CM | POA: Diagnosis not present

## 2017-07-29 DIAGNOSIS — I482 Chronic atrial fibrillation, unspecified: Secondary | ICD-10-CM | POA: Diagnosis present

## 2017-07-29 DIAGNOSIS — I714 Abdominal aortic aneurysm, without rupture: Secondary | ICD-10-CM | POA: Insufficient documentation

## 2017-07-29 DIAGNOSIS — G319 Degenerative disease of nervous system, unspecified: Secondary | ICD-10-CM | POA: Insufficient documentation

## 2017-07-29 DIAGNOSIS — I10 Essential (primary) hypertension: Secondary | ICD-10-CM | POA: Diagnosis not present

## 2017-07-29 DIAGNOSIS — I5033 Acute on chronic diastolic (congestive) heart failure: Secondary | ICD-10-CM | POA: Insufficient documentation

## 2017-07-29 DIAGNOSIS — K219 Gastro-esophageal reflux disease without esophagitis: Secondary | ICD-10-CM | POA: Insufficient documentation

## 2017-07-29 DIAGNOSIS — F4310 Post-traumatic stress disorder, unspecified: Secondary | ICD-10-CM | POA: Insufficient documentation

## 2017-07-29 DIAGNOSIS — E785 Hyperlipidemia, unspecified: Secondary | ICD-10-CM | POA: Insufficient documentation

## 2017-07-29 DIAGNOSIS — Z85828 Personal history of other malignant neoplasm of skin: Secondary | ICD-10-CM | POA: Insufficient documentation

## 2017-07-29 DIAGNOSIS — I25110 Atherosclerotic heart disease of native coronary artery with unstable angina pectoris: Secondary | ICD-10-CM | POA: Insufficient documentation

## 2017-07-29 DIAGNOSIS — J9811 Atelectasis: Secondary | ICD-10-CM | POA: Diagnosis not present

## 2017-07-29 DIAGNOSIS — Z7951 Long term (current) use of inhaled steroids: Secondary | ICD-10-CM | POA: Insufficient documentation

## 2017-07-29 DIAGNOSIS — R400 Somnolence: Secondary | ICD-10-CM

## 2017-07-29 DIAGNOSIS — Z9889 Other specified postprocedural states: Secondary | ICD-10-CM | POA: Insufficient documentation

## 2017-07-29 DIAGNOSIS — F0280 Dementia in other diseases classified elsewhere without behavioral disturbance: Principal | ICD-10-CM | POA: Insufficient documentation

## 2017-07-29 DIAGNOSIS — G9340 Encephalopathy, unspecified: Secondary | ICD-10-CM | POA: Diagnosis not present

## 2017-07-29 DIAGNOSIS — Z79899 Other long term (current) drug therapy: Secondary | ICD-10-CM | POA: Insufficient documentation

## 2017-07-29 DIAGNOSIS — E0590 Thyrotoxicosis, unspecified without thyrotoxic crisis or storm: Secondary | ICD-10-CM | POA: Insufficient documentation

## 2017-07-29 DIAGNOSIS — R262 Difficulty in walking, not elsewhere classified: Secondary | ICD-10-CM | POA: Diagnosis not present

## 2017-07-29 DIAGNOSIS — Z66 Do not resuscitate: Secondary | ICD-10-CM | POA: Insufficient documentation

## 2017-07-29 DIAGNOSIS — F02818 Dementia in other diseases classified elsewhere, unspecified severity, with other behavioral disturbance: Secondary | ICD-10-CM

## 2017-07-29 DIAGNOSIS — F319 Bipolar disorder, unspecified: Secondary | ICD-10-CM | POA: Insufficient documentation

## 2017-07-29 DIAGNOSIS — F0281 Dementia in other diseases classified elsewhere with behavioral disturbance: Secondary | ICD-10-CM

## 2017-07-29 DIAGNOSIS — N179 Acute kidney failure, unspecified: Secondary | ICD-10-CM | POA: Insufficient documentation

## 2017-07-29 DIAGNOSIS — I6782 Cerebral ischemia: Secondary | ICD-10-CM | POA: Insufficient documentation

## 2017-07-29 DIAGNOSIS — R4182 Altered mental status, unspecified: Secondary | ICD-10-CM | POA: Diagnosis not present

## 2017-07-29 DIAGNOSIS — N400 Enlarged prostate without lower urinary tract symptoms: Secondary | ICD-10-CM | POA: Insufficient documentation

## 2017-07-29 DIAGNOSIS — E7800 Pure hypercholesterolemia, unspecified: Secondary | ICD-10-CM | POA: Insufficient documentation

## 2017-07-29 DIAGNOSIS — M6281 Muscle weakness (generalized): Secondary | ICD-10-CM | POA: Insufficient documentation

## 2017-07-29 DIAGNOSIS — Z87440 Personal history of urinary (tract) infections: Secondary | ICD-10-CM | POA: Insufficient documentation

## 2017-07-29 DIAGNOSIS — Z7901 Long term (current) use of anticoagulants: Secondary | ICD-10-CM | POA: Insufficient documentation

## 2017-07-29 DIAGNOSIS — G629 Polyneuropathy, unspecified: Secondary | ICD-10-CM | POA: Insufficient documentation

## 2017-07-29 DIAGNOSIS — I110 Hypertensive heart disease with heart failure: Secondary | ICD-10-CM | POA: Insufficient documentation

## 2017-07-29 DIAGNOSIS — Z87891 Personal history of nicotine dependence: Secondary | ICD-10-CM | POA: Insufficient documentation

## 2017-07-29 DIAGNOSIS — M1990 Unspecified osteoarthritis, unspecified site: Secondary | ICD-10-CM | POA: Insufficient documentation

## 2017-07-29 DIAGNOSIS — Z8249 Family history of ischemic heart disease and other diseases of the circulatory system: Secondary | ICD-10-CM | POA: Insufficient documentation

## 2017-07-29 HISTORY — DX: Malignant (primary) neoplasm, unspecified: C80.1

## 2017-07-29 LAB — I-STAT VENOUS BLOOD GAS, ED
Acid-Base Excess: 1 mmol/L (ref 0.0–2.0)
Bicarbonate: 27.1 mmol/L (ref 20.0–28.0)
O2 Saturation: 57 %
PCO2 VEN: 45.2 mmHg (ref 44.0–60.0)
TCO2: 28 mmol/L (ref 22–32)
pH, Ven: 7.386 (ref 7.250–7.430)
pO2, Ven: 30 mmHg — CL (ref 32.0–45.0)

## 2017-07-29 LAB — COMPREHENSIVE METABOLIC PANEL
ALT: 31 U/L (ref 0–44)
ANION GAP: 10 (ref 5–15)
AST: 31 U/L (ref 15–41)
Albumin: 3.5 g/dL (ref 3.5–5.0)
Alkaline Phosphatase: 116 U/L (ref 38–126)
BUN: 13 mg/dL (ref 8–23)
CALCIUM: 9.3 mg/dL (ref 8.9–10.3)
CHLORIDE: 105 mmol/L (ref 98–111)
CO2: 26 mmol/L (ref 22–32)
Creatinine, Ser: 0.96 mg/dL (ref 0.61–1.24)
GFR calc non Af Amer: >60 mL/min (ref >60)
Glucose, Bld: 101 mg/dL — ABNORMAL HIGH (ref 70–99)
POTASSIUM: 3.8 mmol/L (ref 3.5–5.1)
SODIUM: 141 mmol/L (ref 135–145)
Total Bilirubin: 0.9 mg/dL (ref 0.3–1.2)
Total Protein: 6.2 g/dL — ABNORMAL LOW (ref 6.5–8.1)

## 2017-07-29 LAB — CBC WITH DIFFERENTIAL/PLATELET
Abs Immature Granulocytes: 0 10*3/uL (ref 0.0–0.1)
BASOS ABS: 0 10*3/uL (ref 0.0–0.1)
BASOS PCT: 0 %
EOS PCT: 4 %
Eosinophils Absolute: 0.2 10*3/uL (ref 0.0–0.7)
HCT: 47.1 % (ref 39.0–52.0)
Hemoglobin: 15.3 g/dL (ref 13.0–17.0)
Immature Granulocytes: 0 %
Lymphocytes Relative: 19 %
Lymphs Abs: 1 10*3/uL (ref 0.7–4.0)
MCH: 29.8 pg (ref 26.0–34.0)
MCHC: 32.5 g/dL (ref 30.0–36.0)
MCV: 91.6 fL (ref 78.0–100.0)
MONO ABS: 0.5 10*3/uL (ref 0.1–1.0)
Monocytes Relative: 10 %
Neutro Abs: 3.6 10*3/uL (ref 1.7–7.7)
Neutrophils Relative %: 67 %
PLATELETS: 195 10*3/uL (ref 150–400)
RBC: 5.14 MIL/uL (ref 4.22–5.81)
RDW: 13.5 % (ref 11.5–15.5)
WBC: 5.4 10*3/uL (ref 4.0–10.5)

## 2017-07-29 LAB — URINALYSIS, ROUTINE W REFLEX MICROSCOPIC
BILIRUBIN URINE: NEGATIVE
Bacteria, UA: NONE SEEN
Glucose, UA: NEGATIVE mg/dL
Ketones, ur: 5 mg/dL — AB
Leukocytes, UA: NEGATIVE
NITRITE: NEGATIVE
Protein, ur: NEGATIVE mg/dL
SPECIFIC GRAVITY, URINE: 1.011 (ref 1.005–1.030)
pH: 7 (ref 5.0–8.0)

## 2017-07-29 LAB — CORTISOL: CORTISOL PLASMA: 7.3 ug/dL

## 2017-07-29 LAB — TSH: TSH: 0.471 u[IU]/mL (ref 0.350–4.500)

## 2017-07-29 LAB — BRAIN NATRIURETIC PEPTIDE: B Natriuretic Peptide: 157.2 pg/mL — ABNORMAL HIGH (ref 0.0–100.0)

## 2017-07-29 LAB — AMMONIA: Ammonia: 16 umol/L (ref 9–35)

## 2017-07-29 MED ORDER — FAMOTIDINE 20 MG PO TABS
20.0000 mg | ORAL_TABLET | Freq: Every day | ORAL | Status: DC
  Administered 2017-07-30 – 2017-07-31 (×2): 20 mg via ORAL
  Filled 2017-07-29 (×2): qty 1

## 2017-07-29 MED ORDER — LEVALBUTEROL HCL 0.63 MG/3ML IN NEBU: 0.6300 mg | INHALATION_SOLUTION | RESPIRATORY_TRACT | Status: DC | PRN

## 2017-07-29 MED ORDER — LEVALBUTEROL HCL 1.25 MG/0.5ML IN NEBU
1.2500 mg | INHALATION_SOLUTION | Freq: Three times a day (TID) | RESPIRATORY_TRACT | Status: DC
  Administered 2017-07-30 – 2017-08-01 (×5): 1.25 mg via RESPIRATORY_TRACT
  Filled 2017-07-29 (×8): qty 0.5

## 2017-07-29 MED ORDER — TRAZODONE HCL 50 MG PO TABS
50.0000 mg | ORAL_TABLET | Freq: Every day | ORAL | Status: DC
  Administered 2017-07-30 – 2017-07-31 (×2): 50 mg via ORAL
  Filled 2017-07-29 (×2): qty 1

## 2017-07-29 MED ORDER — LABETALOL HCL 5 MG/ML IV SOLN
10.0000 mg | Freq: Once | INTRAVENOUS | Status: AC
  Administered 2017-07-29: 10 mg via INTRAVENOUS
  Filled 2017-07-29: qty 4

## 2017-07-29 MED ORDER — APIXABAN 5 MG PO TABS
5.0000 mg | ORAL_TABLET | Freq: Two times a day (BID) | ORAL | Status: DC
  Administered 2017-07-30 – 2017-08-01 (×6): 5 mg via ORAL
  Filled 2017-07-29 (×6): qty 1

## 2017-07-29 MED ORDER — DILTIAZEM HCL ER COATED BEADS 240 MG PO CP24
240.0000 mg | ORAL_CAPSULE | Freq: Every day | ORAL | Status: DC
  Administered 2017-07-30 – 2017-08-01 (×3): 240 mg via ORAL
  Filled 2017-07-29 (×3): qty 1

## 2017-07-29 MED ORDER — FINASTERIDE 5 MG PO TABS
5.0000 mg | ORAL_TABLET | Freq: Every day | ORAL | Status: DC
  Administered 2017-07-30 – 2017-08-01 (×3): 5 mg via ORAL
  Filled 2017-07-29 (×3): qty 1

## 2017-07-29 MED ORDER — AQUAPHOR EX OINT
1.0000 | TOPICAL_OINTMENT | Freq: Three times a day (TID) | CUTANEOUS | Status: DC
Start: 2017-07-30 — End: 2017-08-01
  Administered 2017-07-30 – 2017-08-01 (×7): 1 via TOPICAL
  Filled 2017-07-29: qty 50

## 2017-07-29 MED ORDER — TIOTROPIUM BROMIDE MONOHYDRATE 18 MCG IN CAPS
18.0000 ug | ORAL_CAPSULE | Freq: Every day | RESPIRATORY_TRACT | Status: DC
Start: 2017-07-30 — End: 2017-08-01
  Administered 2017-07-30 – 2017-08-01 (×3): 18 ug via RESPIRATORY_TRACT
  Filled 2017-07-29: qty 5

## 2017-07-29 MED ORDER — ONDANSETRON HCL 4 MG/2ML IJ SOLN: 4.0000 mg | Freq: Four times a day (QID) | INTRAMUSCULAR | Status: DC | PRN

## 2017-07-29 MED ORDER — HYPROMELLOSE (GONIOSCOPIC) 2.5 % OP SOLN
1.0000 [drp] | Freq: Two times a day (BID) | OPHTHALMIC | Status: DC
Start: 2017-07-30 — End: 2017-08-01
  Administered 2017-07-30 – 2017-08-01 (×4): 1 [drp] via OPHTHALMIC
  Filled 2017-07-29: qty 15

## 2017-07-29 MED ORDER — LISINOPRIL 20 MG PO TABS
20.0000 mg | ORAL_TABLET | Freq: Every day | ORAL | Status: DC
  Administered 2017-07-30 – 2017-08-01 (×3): 20 mg via ORAL
  Filled 2017-07-29 (×3): qty 1

## 2017-07-29 MED ORDER — ONDANSETRON HCL 4 MG PO TABS
4.0000 mg | ORAL_TABLET | Freq: Four times a day (QID) | ORAL | Status: DC | PRN
Start: 2017-07-29 — End: 2017-08-01

## 2017-07-29 MED ORDER — ACETAMINOPHEN 650 MG RE SUPP
650.0000 mg | Freq: Four times a day (QID) | RECTAL | Status: DC | PRN
Start: 2017-07-29 — End: 2017-08-01

## 2017-07-29 MED ORDER — DM-GUAIFENESIN ER 30-600 MG PO TB12
2.0000 | ORAL_TABLET | Freq: Two times a day (BID) | ORAL | Status: DC
  Administered 2017-07-30 – 2017-08-01 (×5): 2 via ORAL
  Filled 2017-07-29 (×4): qty 2
  Filled 2017-07-29: qty 1

## 2017-07-29 MED ORDER — TAMSULOSIN HCL 0.4 MG PO CAPS
0.4000 mg | ORAL_CAPSULE | Freq: Every day | ORAL | Status: DC
  Administered 2017-07-30 – 2017-07-31 (×2): 0.4 mg via ORAL
  Filled 2017-07-29 (×2): qty 1

## 2017-07-29 MED ORDER — ACETAMINOPHEN 325 MG PO TABS
650.0000 mg | ORAL_TABLET | Freq: Four times a day (QID) | ORAL | Status: DC | PRN
  Administered 2017-07-30: 650 mg via ORAL
  Filled 2017-07-29: qty 2

## 2017-07-29 MED ORDER — METOPROLOL TARTRATE 25 MG PO TABS
100.0000 mg | ORAL_TABLET | Freq: Two times a day (BID) | ORAL | Status: DC
  Administered 2017-07-30 – 2017-08-01 (×5): 100 mg via ORAL
  Filled 2017-07-29 (×5): qty 4

## 2017-07-29 MED ORDER — LORATADINE 10 MG PO TABS
10.0000 mg | ORAL_TABLET | Freq: Every day | ORAL | Status: DC
  Administered 2017-07-30 – 2017-08-01 (×3): 10 mg via ORAL
  Filled 2017-07-29 (×3): qty 1

## 2017-07-29 MED ORDER — SODIUM CHLORIDE 0.9% FLUSH
3.0000 mL | Freq: Two times a day (BID) | INTRAVENOUS | Status: DC
  Administered 2017-07-30 – 2017-08-01 (×6): 3 mL via INTRAVENOUS

## 2017-07-29 MED ORDER — METOPROLOL TARTRATE 25 MG PO TABS
100.0000 mg | ORAL_TABLET | Freq: Once | ORAL | Status: AC
  Administered 2017-07-29: 100 mg via ORAL
  Filled 2017-07-29: qty 4

## 2017-07-29 NOTE — ED Provider Notes (Signed)
[facility] EMERGENCY DEPARTMENT Provider Note   CSN: [id number] Arrival date & time: 07/29/17  1842  History   Chief Complaint Chief Complaint  Patient presents with  . Altered Mental Status   HPI  Patient is a 84 year old male with history of PTSD, CAD status post PCI, CHF, atrial fibrillation now on Eliquis, and COPD presenting to the ED from SNF for altered mental status. History is somewhat limited by patient's mental status. He has had gradually worsening decreased activity, decreased appetite, and decreased speech over the last 2-3 weeks since leaving the hospital. He was hospitalized here for dyspnea felt to be due to COPD exacerbation as well as atrial fibrillation with RVR. He was on glucocorticoids and that time which was stopped 7/1 due to concern for delirium. He has complained of headache recently. No known recent injury, vomiting, diarrhea, cold symptoms, or other illness. His daughter reports that UA and urine culture were negative yesterday for infection. She states communication has been more limited due to patient speaking primarily [place] despite being asked to be speak [place].  Past Medical History:  Diagnosis Date  . Anginal pain ([place])   . Arthritis    "all over"  . Coronary artery disease   . Depression ~ 62; ~ 1990   PTSD/Mania   . GERD (gastroesophageal reflux disease)   . High cholesterol   . Hypertension   . Hyperthyroidism   . Neuropathy   . Osteoporosis   . Skin cancer of nose     Patient Active Problem List   Diagnosis Date Noted  . Acute encephalopathy 07/29/2017  . Pressure injury of skin 07/13/2017  . Atrial fibrillation with RVR ([place]) 07/09/2017  . Acute CHF (congestive heart failure) ([place]) 07/09/2017  . AAA (abdominal aortic aneurysm) ([place]) 07/09/2017  . Acute on chronic diastolic congestive heart failure ([place]) 04/11/2017  . New onset atrial fibrillation ([place]) 04/11/2017  . Acute respiratory failure with hypoxia ([place])  02/23/2017  . CAP (community acquired pneumonia) 02/23/2017  . Nausea & vomiting 02/23/2017  . PTSD (post-traumatic stress disorder)   . Vomiting and diarrhea 11/09/2014  . Unstable angina ([place]) 05/10/2014  . Coronary artery disease due to lipid rich plaque   . Angina pectoris, crescendo ([place]) 04/28/2014  . Contusion of left hip 07/04/2013  . Fall 07/04/2013  . BPH (benign prostatic hypertrophy) 05/31/2013  . Coronary artery disease of native artery of native heart with stable angina pectoris ([place]) 05/31/2013  . UTI (urinary tract infection) 05/31/2013  . Hypercholesterolemia 05/31/2013  . Dysphagia, pharyngoesophageal phase 04/10/2013  . Frequent falls 04/10/2013  . Weakness 04/10/2013  . Hyperthyroidism, subclinical 04/10/2013  . GERD (gastroesophageal reflux disease) 04/06/2013  . Bladder outflow obstruction 04/06/2013  . Dehydration 04/05/2013  . Essential hypertension 04/05/2013  . Arthritis 04/05/2013  . Peripheral neuropathy 04/05/2013  . Gastroenteritis, acute 04/05/2013  . Gastroenteritis 04/05/2013    Past Surgical History:  Procedure Laterality Date  . BACK SURGERY    . CARDIAC CATHETERIZATION  09/2007  . CARDIAC CATHETERIZATION N/A 05/08/2014   Procedure: CORONARY STENT INTERVENTION;  Surgeon: [name], MD; 3.518 mm Xience Alpine DES to the RCA   . LEFT HEART CATHETERIZATION WITH CORONARY ANGIOGRAM N/A 05/08/2014   Procedure: LEFT HEART CATHETERIZATION WITH CORONARY ANGIOGRAM;  Surgeon: [name], MD; LAD 100% (old), CFX 20%, OM1 30%, pRCA 30%, dRCA 95%, EF 55%     . LUMBAR LAMINECTOMY/DECOMPRESSION MICRODISCECTOMY  2011        Home Medications  Prior to Admission medications   Medication Sig Start Date End Date Taking? Authorizing Provider  acetaminophen (TYLENOL) 500 MG tablet Take 500-1,000 mg by mouth every 6 (six) hours as needed for mild pain.    Yes [provider]  apixaban (ELIQUIS) 5 MG TABS tablet Take 1 tablet (5 mg total) by  mouth 2 (two) times daily. 04/20/17  Yes Croitoru, Mihai, MD  Carboxymethylcellulose Sod PF 1 % GEL Place 1 drop into both eyes 2 (two) times daily.    Yes [provider]  Cyanocobalamin (B-12 COMPLIANCE INJECTION) 1000 MCG/ML KIT Inject 1,000 mcg as directed See admin instructions. On the 20th of every month   Yes [provider]  dextromethorphan-guaiFENesin (MUCINEX DM) 30-600 MG 12hr tablet Take 2 tablets by mouth 2 (two) times daily. 07/16/17  Yes Sheikh, Omair Latif, DO  diltiazem (CARDIZEM CD) 240 MG 24 hr capsule Take 1 capsule (240 mg total) by mouth daily. 07/17/17  Yes Sheikh, Omair Latif, DO  Emollient [facility]) OINT Apply 1 application topically 3 (three) times daily. To penis   Yes [provider]  finasteride (PROSCAR) 5 MG tablet Take 5 mg by mouth daily.   Yes [provider]  isosorbide mononitrate (IMDUR) 30 MG 24 hr tablet Take 30 mg by mouth daily.   Yes [provider]  levalbuterol (XOPENEX) 0.63 MG/3ML nebulizer solution Take 3 mLs (0.63 mg total) by nebulization every 3 (three) hours as needed for wheezing. 07/16/17  Yes Sheikh, Omair Latif, DO  levalbuterol (XOPENEX) 1.25 MG/0.5ML nebulizer solution Take 1.25 mg by nebulization 3 (three) times daily. 07/16/17  Yes Sheikh, Omair Latif, DO  lisinopril (PRINIVIL,ZESTRIL) 20 MG tablet Take 1 tablet (20 mg total) by mouth daily. 07/17/17  Yes Sheikh, Omair Latif, DO  loratadine (CLARITIN) 10 MG tablet Take 1 tablet (10 mg total) by mouth daily. 02/28/17  Yes Emokpae, Courage, MD  metoprolol (LOPRESSOR) 100 MG tablet Take 100 mg by mouth 2 (two) times daily.   Yes [provider]  Multiple Vitamins-Minerals (PRESERVISION AREDS 2+MULTI VIT) CAPS Take 1 capsule by mouth 2 (two) times daily.    Yes [provider]  polyethylene glycol (MIRALAX / GLYCOLAX) packet Take 17 g by mouth 2 (two) times daily. 07/16/17  Yes Sheikh, Omair Latif, DO  ranitidine (ZANTAC) 150 MG tablet Take 300 mg  by mouth at bedtime.   Yes [provider]  tamsulosin (FLOMAX) 0.4 MG CAPS capsule Take 1 capsule (0.4 mg total) by mouth daily after supper. 12/02/15  Yes Barrett, Evelene Croon, PA-C  tiotropium (SPIRIVA HANDIHALER) 18 MCG inhalation capsule Place 1 capsule (18 mcg total) into inhaler and inhale daily. 02/27/17 02/27/18 Yes Emokpae, Courage, MD  traZODone (DESYREL) 50 MG tablet Take 50 mg by mouth at bedtime.    Yes [provider]  Vitamin D, Cholecalciferol, 1000 units TABS Take 1,000 Units by mouth at bedtime.   Yes [provider]  atorvastatin (LIPITOR) 10 MG tablet Take 1 tablet (10 mg total) by mouth daily. Patient not taking: Reported on 07/29/2017 02/28/17   Roxan Hockey, MD  pantoprazole (PROTONIX) 40 MG tablet Take 1 tablet (40 mg total) by mouth daily. Patient not taking: Reported on 07/29/2017 05/11/14   Barrett, Evelene Croon, PA-C    Family History Family History  Problem Relation Age of Onset  . CAD Mother   . Asthma Mother     Social History Social History   Tobacco Use  . Smoking status: Former Smoker  Packs/day: 1.00    Years: 20.00    Pack years: 20.00    Types: Cigarettes    Last attempt to quit: 01/12/1962    Years since quitting: 55.5  . Smokeless tobacco: Never Used  Substance Use Topics  . Alcohol use: No    Alcohol/week: 0.0 oz    Comment: 05/08/2014 "he's been in AA for ~!35 yrs"  . Drug use: No     Allergies   Cortisone   Review of Systems Review of Systems  Constitutional: Positive for activity change, appetite change and fatigue. Negative for fever.  HENT: Negative for congestion.   Eyes: Negative for visual disturbance.  Respiratory: Negative for cough and shortness of breath.   Cardiovascular: Negative for chest pain.  Gastrointestinal: Negative for abdominal pain, diarrhea and vomiting.  Musculoskeletal: Negative for back pain.  Neurological: Positive for headaches.  All other systems reviewed and are  negative.    Physical Exam Updated Vital Signs BP (!) 144/87   Pulse 78   Temp 98.6 F (37 C) (Rectal)   Resp (!) 23   SpO2 97%   Physical Exam  Constitutional: He is oriented to person, place, and time. No distress.  HENT:  Head: Normocephalic and atraumatic.  Mouth/Throat: Oropharynx is clear and moist.  Eyes: Pupils are equal, round, and reactive to light. EOM are normal.  Neck: Neck supple. No JVD present.  Cardiovascular: Normal rate, regular rhythm, normal heart sounds and intact distal pulses.  No murmur heard. Pulmonary/Chest: Breath sounds normal. No respiratory distress. He has no wheezes. He has no rales.  Abdominal: Soft. He exhibits no distension and no mass. There is no tenderness. There is no guarding.  Musculoskeletal: Normal range of motion. He exhibits no edema.  Neurological: He is alert and oriented to person, place, and time.  Neurologic exam limited by mental status, hardness of hearing, and language barrier. Speech is fluent. No gross facial asymmetry. Tongue protrusion midline. 4/5 strength symmetrically in all extremities with equal grips.   Skin: Skin is warm and dry.  Psychiatric: He has a normal mood and affect. His behavior is normal.  Nursing note and vitals reviewed.    ED Treatments / Results  Labs (all labs ordered are listed, but only abnormal results are displayed) Labs Reviewed  COMPREHENSIVE METABOLIC PANEL - Abnormal; Notable for the following components:      Result Value   Glucose, Bld 101 (*)    Total Protein 6.2 (*)    All other components within normal limits  URINALYSIS, ROUTINE W REFLEX MICROSCOPIC - Abnormal; Notable for the following components:   Hgb urine dipstick MODERATE (*)    Ketones, ur 5 (*)    RBC / HPF >50 (*)    All other components within normal limits  BRAIN NATRIURETIC PEPTIDE - Abnormal; Notable for the following components:   B Natriuretic Peptide 157.2 (*)    All other components within normal limits   I-STAT VENOUS BLOOD GAS, ED - Abnormal; Notable for the following components:   pO2, Ven 30.0 (*)    All other components within normal limits  CBC WITH DIFFERENTIAL/PLATELET  AMMONIA  TSH  CORTISOL  BLOOD GAS, VENOUS  CBC  BASIC METABOLIC PANEL    EKG None  Radiology Dg Chest 2 View  Result Date: 07/29/2017 CLINICAL DATA:  Altered mental status. EXAM: CHEST - 2 VIEW COMPARISON:  07/15/2017 FINDINGS: Cardiomegaly with vascular congestion. Low lung volumes with bibasilar atelectasis. No effusions or acute bony abnormality. IMPRESSION: Cardiomegaly,  vascular congestion. Low volumes with bibasilar atelectasis. Electronically Signed   By: Rolm Baptise M.D.   On: 07/29/2017 20:03   Ct Head Wo Contrast  Result Date: 07/29/2017 CLINICAL DATA:  Altered mental status. EXAM: CT HEAD WITHOUT CONTRAST TECHNIQUE: Contiguous axial images were obtained from the base of the skull through the vertex without intravenous contrast. COMPARISON:  CT head dated November 09, 2014. FINDINGS: Brain: No evidence of acute infarction, hemorrhage, hydrocephalus, extra-axial collection or mass lesion/mass effect. Stable atrophy and chronic microvascular ischemic changes. Vascular: Atherosclerotic vascular calcification of the carotid siphons. No hyperdense vessel. Skull: Normal. Negative for fracture or focal lesion. Sinuses/Orbits: No acute finding. Other: None. IMPRESSION: 1.  No acute intracranial abnormality. 2. Stable atrophy and chronic microvascular ischemic changes. Electronically Signed   By: Titus Dubin M.D.   On: 07/29/2017 20:29    Procedures Procedures (including critical care time)  Medications Ordered in ED Medications  metoprolol tartrate (LOPRESSOR) tablet 100 mg (100 mg Oral Given 07/29/17 2258)  labetalol (NORMODYNE,TRANDATE) injection 10 mg (10 mg Intravenous Given 07/29/17 2252)     Initial Impression / Assessment and Plan / ED Course  I have reviewed the triage vital signs and the  nursing notes.  Pertinent labs & imaging results that were available during my care of the patient were reviewed by me and considered in my medical decision making (see chart for details).  Patient is a 84 year old male presenting to the ED from rehabilitation for progressively worsening mental status as above. Unclear etiology of his confusion and mental status change. CT head shows no acute findings. Screening labs reassuring with normal VBG, normal ammonia, no signs of UTI or pneumonia. He does not appear septic. EKG unchanged from prior. His blood pressure is moderately elevated compared to prior with systolic in the 677J to 736K. This would be an unusual presentation for PRES but patient given home dose metoprolol as well as low dose IV labetalol. May be behavioral due to primary psychiatry etiology or recent steroid use.  Patient admitted to medicine for further evaluation.  Final Clinical Impressions(s) / ED Diagnoses   Final diagnoses:  Somnolence     Prescilla Sours, MD 07/30/17 [id number]    Noemi Chapel, MD 08/03/17 1139

## 2017-07-29 NOTE — H&P (Signed)
History and Physical    [name] [name] ZOX:[id number] DOB: 12/13/1925 DOA: 07/29/2017  Referring MD/NP/PA: Donna Bernard, MD (resident)) PCP: [name], MD  Patient coming from: [facility] via EMS  Chief Complaint: Altered  I have personally briefly reviewed patient's old medical records in [place]   HPI: [name] [name] is a 84 y.o. male with medical history significant of HTN, HLD, A. fib on Eliquis, CAD(DES to LAD in 2016), hyperthyroidism, PTSD, and descending AAA; who presents after being noted to be more altered than baseline.  Normally patient is reportedly very lucid and alert and oriented up until approximately 1 to 2 months ago. Daughter is present at bedside and helps provide additional history.  She makes note that her father has a history of PTSD from time in [place] during the war for which prednisone has triggered episodes of abnormal behavior in her father.  Patient was hospitalized back in February and after being treated with prednisone for a month had a prolonged hypomanic episode.  In July he was admitted and placed on prednisone for approximately 3 days.  He was initially more emotional, giggly, and talkative than normal for few days.  Thereafter, patient had difficulty playing his normal board games and was began talking in [place] more than he normally does.  Patient had also complained of headache, had poor appetite, more lethargic, and was refusing his meds.  His daughter question the possibility of urinary tract infection or prednisone still being in his system.  Patient denies all complaints at this time.  ED Course: Upon arrival to the emergency department patient was seen to be afebrile, respirations 14-24, blood pressure 156/97-182/96, and all other vital signs maintained. Labs revealed normal CBC, CMP, TSH, venous blood gas.  Cortisol level  noted to be 7.3 with an normal limits and BNP  157.2.  CT imaging of the brain showed no acute abnormalities.   Urinalysis showed hematuria without significant signs of bacteria.  Chest x-ray showed cardiomegaly with vascular congestion and low volumes with basilar atelectasis.  Patient was given 10 mg of labetalol IV given his normal dose of metoprolol.   Review of Systems  Unable to perform ROS: Mental status change  Constitutional: Negative for chills and fever.  Respiratory: Positive for cough.   Cardiovascular: Negative for chest pain.  Neurological: Positive for headaches.    Past Medical History:  Diagnosis Date  . Anginal pain ([place])   . Arthritis    "all over"  . Coronary artery disease   . Depression ~ 34; ~ 1990   PTSD/Mania   . GERD (gastroesophageal reflux disease)   . High cholesterol   . Hypertension   . Hyperthyroidism   . Neuropathy   . Osteoporosis   . Skin cancer of nose     Past Surgical History:  Procedure Laterality Date  . BACK SURGERY    . CARDIAC CATHETERIZATION  09/2007  . CARDIAC CATHETERIZATION N/A 05/08/2014   Procedure: CORONARY STENT INTERVENTION;  Surgeon: [name], MD; 3.518 mm Xience Alpine DES to the RCA   . LEFT HEART CATHETERIZATION WITH CORONARY ANGIOGRAM N/A 05/08/2014   Procedure: LEFT HEART CATHETERIZATION WITH CORONARY ANGIOGRAM;  Surgeon: [name], MD; LAD 100% (old), CFX 20%, OM1 30%, pRCA 30%, dRCA 95%, EF 55%     . LUMBAR LAMINECTOMY/DECOMPRESSION MICRODISCECTOMY  2011     reports that he quit smoking about 55 years ago. His smoking use included cigarettes. He has a 20.00 pack-year smoking history. He has  never used smokeless tobacco. He reports that he does not drink alcohol or use drugs.  Allergies  Allergen Reactions  . Cortisone Other (See Comments)    Causes emotional problems     Family History  Problem Relation Age of Onset  . CAD Mother   . Asthma Mother     Prior to Admission medications   Medication Sig Start Date End Date Taking? Authorizing Provider  acetaminophen (TYLENOL) 500 MG tablet Take 500-1,000 mg  by mouth every 6 (six) hours as needed for mild pain.    Yes [provider]  apixaban (ELIQUIS) 5 MG TABS tablet Take 1 tablet (5 mg total) by mouth 2 (two) times daily. 04/20/17  Yes Croitoru, Mihai, MD  Carboxymethylcellulose Sod PF 1 % GEL Place 1 drop into both eyes 2 (two) times daily.    Yes [provider]  Cyanocobalamin (B-12 COMPLIANCE INJECTION) 1000 MCG/ML KIT Inject 1,000 mcg as directed See admin instructions. On the 20th of every month   Yes [provider]  dextromethorphan-guaiFENesin (MUCINEX DM) 30-600 MG 12hr tablet Take 2 tablets by mouth 2 (two) times daily. 07/16/17  Yes Sheikh, Omair Latif, DO  diltiazem (CARDIZEM CD) 240 MG 24 hr capsule Take 1 capsule (240 mg total) by mouth daily. 07/17/17  Yes Sheikh, Omair Latif, DO  [facility]) OINT Apply 1 application topically 3 (three) times daily. To penis   Yes [provider]  finasteride (PROSCAR) 5 MG tablet Take 5 mg by mouth daily.   Yes [provider]  isosorbide mononitrate (IMDUR) 30 MG 24 hr tablet Take 30 mg by mouth daily.   Yes [provider]  levalbuterol (XOPENEX) 0.63 MG/3ML nebulizer solution Take 3 mLs (0.63 mg total) by nebulization every 3 (three) hours as needed for wheezing. 07/16/17  Yes Sheikh, Omair Latif, DO  levalbuterol (XOPENEX) 1.25 MG/0.5ML nebulizer solution Take 1.25 mg by nebulization 3 (three) times daily. 07/16/17  Yes Sheikh, Omair Latif, DO  lisinopril (PRINIVIL,ZESTRIL) 20 MG tablet Take 1 tablet (20 mg total) by mouth daily. 07/17/17  Yes Sheikh, Omair Latif, DO  loratadine (CLARITIN) 10 MG tablet Take 1 tablet (10 mg total) by mouth daily. 02/28/17  Yes Emokpae, Courage, MD  metoprolol (LOPRESSOR) 100 MG tablet Take 100 mg by mouth 2 (two) times daily.   Yes [provider]  Multiple Vitamins-Minerals (PRESERVISION AREDS 2+MULTI VIT) CAPS Take 1 capsule by mouth 2 (two) times daily.    Yes [provider]  polyethylene  glycol (MIRALAX / GLYCOLAX) packet Take 17 g by mouth 2 (two) times daily. 07/16/17  Yes Sheikh, Omair Latif, DO  ranitidine (ZANTAC) 150 MG tablet Take 300 mg by mouth at bedtime.   Yes [provider]  tamsulosin (FLOMAX) 0.4 MG CAPS capsule Take 1 capsule (0.4 mg total) by mouth daily after supper. 12/02/15  Yes Barrett, Evelene Croon, PA-C  tiotropium (SPIRIVA HANDIHALER) 18 MCG inhalation capsule Place 1 capsule (18 mcg total) into inhaler and inhale daily. 02/27/17 02/27/18 Yes Emokpae, Courage, MD  traZODone (DESYREL) 50 MG tablet Take 50 mg by mouth at bedtime.    Yes [provider]  Vitamin D, Cholecalciferol, 1000 units TABS Take 1,000 Units by mouth at bedtime.   Yes [provider]  atorvastatin (LIPITOR) 10 MG tablet Take 1 tablet (10 mg total) by mouth daily. Patient not taking: Reported on 07/29/2017 02/28/17   Roxan Hockey, MD  pantoprazole (PROTONIX) 40 MG tablet Take 1 tablet (40 mg total) by mouth  daily. Patient not taking: Reported on 07/29/2017 05/11/14   Barrett, Evelene Croon, PA-C    Physical Exam:  Constitutional: Elderly male in NAD, calm, comfortable Vitals:   07/29/17 2045 07/29/17 2100 07/29/17 2130 07/29/17 2145  BP: (!) 156/97 (!) 176/94 (!) 180/98 (!) 171/91  Pulse:      Resp: 20 20 19  (!) 21  Temp:      TempSrc:      SpO2:       Eyes: PERRL, lids and conjunctivae normal ENMT: Hard of hearing wearing hearing aids. mucous membranes are moist. Posterior pharynx clear of any exudate or lesions  Neck: normal, supple, no masses, no thyromegaly Respiratory: Mildly decreased aeration, but no wheezing, no crackles. Normal respiratory effort. No accessory muscle use.  Cardiovascular: Irregular irregular, no murmurs / rubs / gallops.  1+ pitting edema on the left lower extremity no edema on the right lower extremity.  2+ pedal pulses. No carotid bruits.  Abdomen: no tenderness, no masses palpated. No hepatosplenomegaly. Bowel sounds positive.    Musculoskeletal: no clubbing / cyanosis. No joint deformity upper and lower extremities. Good ROM, no contractures. Normal muscle tone.  Skin: no rashes, lesions, ulcers. No induration Neurologic: CN 2-12 grossly intact. Sensation intact, DTR normal. Strength 5/5 in all 4.  Psychiatric: Normal judgment and insight. Alert and oriented x 2. Normal mood.  Patient speaking and understanding English at this time.   Labs on Admission: I have personally reviewed following labs and imaging studies  CBC: Recent Labs  Lab 07/29/17 2030  WBC 5.4  NEUTROABS 3.6  HGB 15.3  HCT 47.1  MCV 91.6  PLT 938   Basic Metabolic Panel: Recent Labs  Lab 07/29/17 2030  NA 141  K 3.8  CL 105  CO2 26  GLUCOSE 101*  BUN 13  CREATININE 0.96  CALCIUM 9.3   GFR: Estimated Creatinine Clearance: 59.2 mL/min (by C-G formula based on SCr of 0.96 mg/dL). Liver Function Tests: Recent Labs  Lab 07/29/17 2030  AST 31  ALT 31  ALKPHOS 116  BILITOT 0.9  PROT 6.2*  ALBUMIN 3.5   No results for input(s): LIPASE, AMYLASE in the last 168 hours. Recent Labs  Lab 07/29/17 2030  AMMONIA 16   Coagulation Profile: No results for input(s): INR, PROTIME in the last 168 hours. Cardiac Enzymes: No results for input(s): CKTOTAL, CKMB, CKMBINDEX, TROPONINI in the last 168 hours. BNP (last 3 results) No results for input(s): PROBNP in the last 8760 hours. HbA1C: No results for input(s): HGBA1C in the last 72 hours. CBG: No results for input(s): GLUCAP in the last 168 hours. Lipid Profile: No results for input(s): CHOL, HDL, LDLCALC, TRIG, CHOLHDL, LDLDIRECT in the last 72 hours. Thyroid Function Tests: Recent Labs    07/29/17 2030  TSH 0.471   Anemia Panel: No results for input(s): VITAMINB12, FOLATE, FERRITIN, TIBC, IRON, RETICCTPCT in the last 72 hours. Urine analysis:    Component Value Date/Time   COLORURINE YELLOW 07/29/2017 2202   APPEARANCEUR CLEAR 07/29/2017 2202   LABSPEC 1.011  07/29/2017 2202   PHURINE 7.0 07/29/2017 2202   GLUCOSEU NEGATIVE 07/29/2017 2202   HGBUR MODERATE (A) 07/29/2017 2202   BILIRUBINUR NEGATIVE 07/29/2017 2202   KETONESUR 5 (A) 07/29/2017 2202   PROTEINUR NEGATIVE 07/29/2017 2202   UROBILINOGEN 0.2 11/09/2014 1532   NITRITE NEGATIVE 07/29/2017 2202   LEUKOCYTESUR NEGATIVE 07/29/2017 2202   Sepsis Labs: No results found for this or any previous visit (from the past 240 hour(s)).  Radiological Exams on Admission: Dg Chest 2 View  Result Date: 07/29/2017 CLINICAL DATA:  Altered mental status. EXAM: CHEST - 2 VIEW COMPARISON:  07/15/2017 FINDINGS: Cardiomegaly with vascular congestion. Low lung volumes with bibasilar atelectasis. No effusions or acute bony abnormality. IMPRESSION: Cardiomegaly, vascular congestion. Low volumes with bibasilar atelectasis. Electronically Signed   By: Rolm Baptise M.D.   On: 07/29/2017 20:03   Ct Head Wo Contrast  Result Date: 07/29/2017 CLINICAL DATA:  Altered mental status. EXAM: CT HEAD WITHOUT CONTRAST TECHNIQUE: Contiguous axial images were obtained from the base of the skull through the vertex without intravenous contrast. COMPARISON:  CT head dated November 09, 2014. FINDINGS: Brain: No evidence of acute infarction, hemorrhage, hydrocephalus, extra-axial collection or mass lesion/mass effect. Stable atrophy and chronic microvascular ischemic changes. Vascular: Atherosclerotic vascular calcification of the carotid siphons. No hyperdense vessel. Skull: Normal. Negative for fracture or focal lesion. Sinuses/Orbits: No acute finding. Other: None. IMPRESSION: 1.  No acute intracranial abnormality. 2. Stable atrophy and chronic microvascular ischemic changes. Electronically Signed   By: Titus Dubin M.D.   On: 07/29/2017 20:29    EKG: Independently reviewed.  Atrial fibrillation at 84 bpm with PVC and signs of RBBB  Assessment/Plan Acute encephalopathy: Acute.  P patient reportedly acting abnormal speaking in  [place] and refusing medications.  Patient with previous history of PTSD that family reports brought on by prednisone.  Last dose of prednisone at the beginning of the month and should be out of system at this time.  Initial work-up negative for acute cause of patient's symptoms. - Admit to a telemetry bed - Neurochecks - Follow-up MRI of the brain - If workup negative may benefit from psychiatric evaluation given history of PTSD  Atrial fibrillation on chronic anticoagulation: Stable. - Continue Cardizem and Eliquis  COPD, without acute exacerbation - Continue Spiriva and levalbuterol nebs TID  Chronic diastolic CHF: Patient appears to be relatively euvolemic on physical exam.  Continue to monitor - Continue to monitor  Essential hypertension - Continue lisinopril and metoprolol   BPH : Patient has indwelling Foley catheter. - Continue Flomax and Proscar  PTSD  DVT prophylaxis: Eliquis Code Status:DNR Family Communication: Discussed plan of care with patient and daughter present at bedside Disposition Plan: Discharge back to skilled nursing facility once medically stable Consults called: None Admission status: Observation  Norval Morton MD [facility] Pager [phone]   If 7PM-7AM, please contact night-coverage [email or web address] Password [facility]  07/29/2017, 10:32 PM

## 2017-07-29 NOTE — ED Triage Notes (Signed)
Pt to ER via [facility] from [facility] for evaluation of altered mental status onset yesterday that has progressively gotten worse. Staff stated primary language is german but usually speaks english to staff and today has only spoken german. Pt in NAD on arrival. Afib on the monitor, hx of same.

## 2017-07-30 ENCOUNTER — Observation Stay (HOSPITAL_COMMUNITY): Payer: Medicare Other

## 2017-07-30 ENCOUNTER — Other Ambulatory Visit: Payer: Self-pay

## 2017-07-30 ENCOUNTER — Encounter (HOSPITAL_COMMUNITY): Payer: Self-pay

## 2017-07-30 DIAGNOSIS — G9340 Encephalopathy, unspecified: Secondary | ICD-10-CM | POA: Diagnosis not present

## 2017-07-30 DIAGNOSIS — J449 Chronic obstructive pulmonary disease, unspecified: Secondary | ICD-10-CM | POA: Diagnosis present

## 2017-07-30 DIAGNOSIS — Z7901 Long term (current) use of anticoagulants: Secondary | ICD-10-CM

## 2017-07-30 LAB — CBC
HCT: 46.2 % (ref 39.0–52.0)
HEMOGLOBIN: 15.1 g/dL (ref 13.0–17.0)
MCH: 29.8 pg (ref 26.0–34.0)
MCHC: 32.7 g/dL (ref 30.0–36.0)
MCV: 91.3 fL (ref 78.0–100.0)
Platelets: 198 10*3/uL (ref 150–400)
RBC: 5.06 MIL/uL (ref 4.22–5.81)
RDW: 13.5 % (ref 11.5–15.5)
WBC: 5.8 10*3/uL (ref 4.0–10.5)

## 2017-07-30 LAB — BASIC METABOLIC PANEL
ANION GAP: 11 (ref 5–15)
BUN: 10 mg/dL (ref 8–23)
CHLORIDE: 106 mmol/L (ref 98–111)
CO2: 23 mmol/L (ref 22–32)
Calcium: 9.2 mg/dL (ref 8.9–10.3)
Creatinine, Ser: 0.93 mg/dL (ref 0.61–1.24)
GFR calc Af Amer: >60 mL/min (ref >60)
Glucose, Bld: 105 mg/dL — ABNORMAL HIGH (ref 70–99)
POTASSIUM: 3.7 mmol/L (ref 3.5–5.1)
SODIUM: 140 mmol/L (ref 135–145)

## 2017-07-30 MED ORDER — LABETALOL HCL 5 MG/ML IV SOLN: 10.0000 mg | INTRAVENOUS | Status: DC | PRN

## 2017-07-30 MED ORDER — HALOPERIDOL LACTATE 5 MG/ML IJ SOLN
2.0000 mg | Freq: Once | INTRAMUSCULAR | Status: AC
  Administered 2017-07-30: 2 mg via INTRAVENOUS

## 2017-07-30 MED ORDER — ENSURE ENLIVE PO LIQD
237.0000 mL | Freq: Two times a day (BID) | ORAL | Status: DC
  Administered 2017-07-30 – 2017-08-01 (×2): 237 mL via ORAL
  Filled 2017-07-30: qty 237

## 2017-07-30 MED ORDER — AMLODIPINE BESYLATE 5 MG PO TABS: 5.0000 mg | ORAL_TABLET | Freq: Every day | ORAL | Status: DC

## 2017-07-30 MED ORDER — HALOPERIDOL LACTATE 5 MG/ML IJ SOLN
2.0000 mg | Freq: Four times a day (QID) | INTRAMUSCULAR | Status: DC | PRN
  Administered 2017-07-31 (×2): 2 mg via INTRAVENOUS
  Filled 2017-07-30 (×2): qty 1

## 2017-07-30 MED ORDER — HYDRALAZINE HCL 20 MG/ML IJ SOLN
10.0000 mg | INTRAMUSCULAR | Status: DC | PRN
  Administered 2017-07-30: 10 mg via INTRAVENOUS
  Filled 2017-07-30: qty 1

## 2017-07-30 MED ORDER — HYDROCHLOROTHIAZIDE 12.5 MG PO CAPS
12.5000 mg | ORAL_CAPSULE | Freq: Every day | ORAL | Status: DC
  Administered 2017-07-30 – 2017-07-31 (×2): 12.5 mg via ORAL
  Filled 2017-07-30 (×2): qty 1

## 2017-07-30 MED ORDER — HALOPERIDOL LACTATE 5 MG/ML IJ SOLN
INTRAMUSCULAR | Status: AC
  Filled 2017-07-30: qty 1

## 2017-07-30 MED ORDER — QUETIAPINE FUMARATE 25 MG PO TABS
12.5000 mg | ORAL_TABLET | Freq: Every day | ORAL | Status: DC
  Administered 2017-07-30 – 2017-07-31 (×2): 12.5 mg via ORAL
  Filled 2017-07-30 (×2): qty 1

## 2017-07-30 MED ORDER — LORAZEPAM 2 MG/ML IJ SOLN
0.5000 mg | INTRAMUSCULAR | Status: AC
  Administered 2017-07-30: 0.5 mg via INTRAVENOUS
  Filled 2017-07-30: qty 1

## 2017-07-30 NOTE — Progress Notes (Signed)
SLP Cancellation Note  Patient Details Name: [name] [name] MRN: [id number] DOB: March 30, 1925   Cancelled treatment:       Reason Eval/Treat Not Completed: Other (comment) Pt agitated, working with nursing at the moment. Will f/u for swallow eval.   Germain Osgood 07/30/2017, 1:17 PM  Germain Osgood, M.A. CCC-SLP [phone]

## 2017-07-30 NOTE — Progress Notes (Signed)
Patient calm and cooperative.  Soft wrist restraints removed.  DAughters @ bedside x 2 and both updated on plan of care.

## 2017-07-30 NOTE — Progress Notes (Signed)
Daughter did now want the purple DNR bracelet on her father and she wants to speak with somebody about his code status.

## 2017-07-30 NOTE — Procedures (Signed)
ELECTROENCEPHALOGRAM REPORT   Patient: [name] [name]       Room #: 3E01C EEG No. ID: [id number] Age: 84 y.o.        Sex: male Referring Physician: Florene Glen Report Date:  07/30/2017        Interpreting Physician: Alexis Goodell  History: Randi College is an 84 y.o. male with altered mental status  Medications:  Eliquis, Mucinex, Cardizem, Pepcid, Proscar, Microzide, Xopenex, Prinivil, Claritin, Lopressor, Seroquel, Flomax, Spiriva, Desyrel  Conditions of Recording:  This is a 21 channel routine scalp EEG performed with bipolar and monopolar montages arranged in accordance to the international 10/20 system of electrode placement. One channel was dedicated to EKG recording.  The patient in the awake and drowsy states.  Description:  The waking background activity consists of a low voltage, symmetrical, fairly well organized, 8.5 Hz alpha activity, seen from the parieto-occipital and posterior temporal regions.  Low voltage fast activity, poorly organized, is seen anteriorly and is at times superimposed on more posterior regions.  A mixture of theta and alpha rhythms are seen from the central and temporal regions. The patient drowses with slowing to irregular, low voltage theta and beta activity.   Stage II sleep is not obtained. No epileptiform activity is noted.   Hyperventilation and intermittent photic stimulation were not performed.  IMPRESSION: This is a normal awake and drowsy electroencephalogram. There are no focal lateralizing or epileptiform features noted.   Alexis Goodell, MD Neurology [phone] 07/30/2017, 5:22 PM

## 2017-07-30 NOTE — Progress Notes (Signed)
Initial Nutrition Assessment  DOCUMENTATION CODES:   Not applicable  INTERVENTION:    Continue Ensure Enlive po BID, each supplement provides 350 kcal and 20 grams of protein  NUTRITION DIAGNOSIS:   Predicted suboptimal nutrient intake related to acute illness as evidenced by percent weight loss(6% weight loss within 1 month).  GOAL:   Patient will meet greater than or equal to 90% of their needs  MONITOR:   PO intake, Supplement acceptance  REASON FOR ASSESSMENT:   Malnutrition Screening Tool    ASSESSMENT:   84 yo male with PMH of HTN, CHF, osteoporosis, GERD, neuropathy, CAD, HLD, hyperthyroidism, PTSD, who was admitted on 7/18 with AMS.  Patient has been agitated and combative today. MRI was unable to be completed earlier today. Unable to complete nutrition focused physical exam at this time. Unable to obtain any nutrition history, family not currently at bedside.  Patient is at nutrition risk, given 6% weight loss within the past month. Some of the weight loss could be related to fluids with hx of CHF.  Labs and medications reviewed. Patient consumed 100% of breakfast today per nursing flow sheet.  Swallow evaluation has been ordered.  NUTRITION - FOCUSED PHYSICAL EXAM:  unable to complete NFPE at this time  Diet Order:   Diet Order           Diet Heart Room service appropriate? Yes; Fluid consistency: Thin  Diet effective now          EDUCATION NEEDS:   No education needs have been identified at this time  Skin:  Skin Assessment: Reviewed RN Assessment  Last BM:  7/19  Height:   Ht Readings from Last 1 Encounters:  07/10/17 5\' 11"  (1.803 m)    Weight:   Wt Readings from Last 1 Encounters:  07/30/17 189 lb 13.1 oz (86.1 kg)    Ideal Body Weight:  78.2 kg  BMI:  Body mass index is 26.47 kg/m.  Estimated Nutritional Needs:   Kcal:  1850-2100  Protein:  90-100 gm  Fluid:  1.8-2 L    Molli Barrows, RD, LDN, [place] Pager  [phone] After Hours Pager [phone]

## 2017-07-30 NOTE — Progress Notes (Signed)
Patient resting quietly.

## 2017-07-30 NOTE — Progress Notes (Signed)
PT Cancellation Note  Patient Details Name: [name] [name] MRN: [id number] DOB: July 22, 1925   Cancelled Treatment:    Reason Eval/Treat Not Completed: (P) Patient at procedure or test/unavailable Pt off floor at MRI. Will check back this afternoon.  Aline Wesche B. Migdalia Dk PT, DPT Acute Rehabilitation  [phone] Pager [phone]    [place] 07/30/2017, 11:02 AM

## 2017-07-30 NOTE — Progress Notes (Signed)
Dr. Florene Glen requested MRI be done tonight if at all possible and to pre-medicate patient with dose of Haldol IV (prn on chart) 30 minutes prior to exam.  MRI notified of this request and they stated they will try to do exam tonight and will call RN ahead so she can pre-medicate patient.Patient's daughters at bedside, [name] and Glenford Bayley, and both updated on plan of care.

## 2017-07-30 NOTE — Clinical Social Work Note (Signed)
Clinical Social Work Assessment  Patient Details  Name: [name] [name] MRN: [id number] Date of Birth: 09/13/1925  Date of referral:  07/30/17               Reason for consult:  Discharge Planning                Permission sought to share information with:  Facility Sport and exercise psychologist, Family Supports Permission granted to share information::  Yes, Verbal Permission Granted  Name::        Agency::  [facility]  Relationship::  Daughter  Contact Information:     Housing/Transportation Living arrangements for the past 2 months:  [place], [place] of Information:  Patient, Medical Team, Adult Children Patient Interpreter Needed:  None Criminal Activity/Legal Involvement Pertinent to Current Situation/Hospitalization:  No - Comment as needed Significant Relationships:  Adult Children, Siblings Lives with:  Self Do you feel safe going back to the place where you live?  Yes Need for family participation in patient care:  Yes (Comment)  Care giving concerns:  Patient is a short-term rehab resident from [facility].   Social Worker assessment / plan:  CSW met with patient. Daughter at bedside. CSW introduced role and explained that discharge planning would be discussed. Patient confirmed he was admitted from [facility] and plans to return for further rehab once stable for discharge. He has already met his 3-day inpatient stay from last admission on 6/28. Patient's daughter is paying for a bed hold. Patient asking when he can get his restraints off. RN will reassess prior to removing. SNF can take patient over the weekend if stable but restraints will have to be off for at least 24 hours. No further concerns. CSW encouraged patient and his daughter to contact CSW as needed. CSW will continue to follow patient and his daughter for support and facilitate discharge to SNF once medically stable.  Employment status:  Retired Radiation protection practitioner:  Medicare PT Recommendations:  Not assessed at this time [place] / Referral to community resources:  [place]  Patient/Family's Response to care:  Patient and his daughter agreeable to return to SNF. Patient's children and sister supportive and involved in patient's care. Patient and his daughter appreciated social work intervention.  Patient/Family's Understanding of and Emotional Response to Diagnosis, Current Treatment, and Prognosis:  Patient and his daughter have a good understanding of the reason for admission and plan to return to SNF once medically stable. Patient and his daughter appear happy with hospital care.  Emotional Assessment Appearance:  Appears stated age Attitude/Demeanor/Rapport:  Engaged, Gracious Affect (typically observed):  Accepting, Appropriate, Calm, Pleasant Orientation:  Oriented to Self, Oriented to Place, Oriented to  Time, Oriented to Situation Alcohol / Substance use:  Never Used Psych involvement (Current and /or in the community):  No (Comment)  Discharge Needs  Concerns to be addressed:  Care Coordination Readmission within the last 30 days:  Yes Current discharge risk:  Dependent with Mobility, Lives alone Barriers to Discharge:  Requiring sitter/restraints, Continued Medical Work up   Candie Chroman, LCSW 07/30/2017, 4:20 PM

## 2017-07-30 NOTE — Progress Notes (Signed)
Pt came down for MRI.  Pt was sedated but when we moved him to the table he became very agitated and was grabbing staff hard and not letting go and striking out at others.  Involved daughter to see if we could get him to calm down, but to no avail.  Exam not done, pt taken back to his room.  Will probably require general anesthesia to accomplish.

## 2017-07-30 NOTE — Progress Notes (Signed)
PROGRESS NOTE    [name]  RXV:[id number] DOB: 06-20-1925 DOA: 07/29/2017 PCP: [name], MD   Brief Narrative:   [name] [name] is [name] [name] 84 y.o. male with medical history significant of HTN, HLD, Dnya Hickle. fib on Eliquis, CAD(DES to LAD in 2016), hyperthyroidism, PTSD, and descending AAA; who presents after being noted to be more altered than baseline.  Normally patient is reportedly very lucid and alert and oriented up until approximately 1 to 2 months ago. Daughter is present at bedside and helps provide additional history.  She makes note that her father has [name] [name] history of PTSD from time in [place] during the war for which prednisone has triggered episodes of abnormal behavior in her father.  Patient was hospitalized back in February and after being treated with prednisone for Onix Jumper month had Temitope Griffing prolonged hypomanic episode.  In July he was admitted and placed on prednisone for approximately 3 days.  He was initially more emotional, giggly, and talkative than normal for few days.  Thereafter, patient had difficulty playing his normal board games and was began talking in [place] more than he normally does.  Patient had also complained of headache, had poor appetite, more lethargic, and was refusing his meds.  His daughter question the possibility of urinary tract infection or prednisone still being in his system.  Patient denies all complaints at this time.  Assessment & Plan:   Active Problems:   Benign prostatic hyperplasia   PTSD (post-traumatic stress disorder)   Atrial fibrillation, chronic (HCC)   Acute encephalopathy   Chronic anticoagulation   COPD (chronic obstructive pulmonary disease) (HCC)   Acute encephalopathy: Patient reportedly acting abnormal speaking in [place] and refusing medications.  Also having difficulty eating as well. Family notes he's had episodes of AMS when previously prescribed prednisone (on multiple occasions), though this was last over 1 week ago.  Family notes he has  history of bipolar/psychiatry illness and is concerned this maybe contributing.   - Workup so far unremarkable.  Previously normal B12, RPR, folate, TSH, HIV.  VBG with normal PCO2.  Ammonia normal.  Head CT without acute abnormality, stable atrophy and chronic microvascular ischemic changes. - UA with blood, but foley in place -> will send for urine culture  - Pt became agitated with MRI, will attempt again, will premedicate with haldol - Will add prn meds for BP and add HCTZ for BP to help exclude HTN as contributor - EEG was normal - Haldol q6 prn, will give seroquel tonight as well -> discussed plan for temporary use of these meds - I suspect this is delirium due to underlying dementia, but will follow up MRI as noted above.  Will consult psych given concerns for possible contribution from previous psych history (bipolar/PTSD).  Atrial fibrillation on chronic anticoagulation: Stable. - Continue Cardizem and Eliquis  COPD, without acute exacerbation - Continue Spiriva and levalbuterol nebs TID  Chronic diastolic CHF: stable, euvolemic  Essential hypertension -Uncontrolled.  Continue lisinopril and metoprolol.  Add HCTZ.  Labetalol prn SBP>180.  BPH : Patient has indwelling Foley catheter. - Continue Flomax and Proscar - needs urology f/u  PTSD  Goals of Care: family upset pt DNR.  C/s palliative to discuss and made pt full code again after discussion.  When palliative came by, pt was agitated.  Daughter canceled consult.  Will ctm, discuss again later.   DVT prophylaxis: eliquis Code Status: full  Family Communication: daughters at bedside Disposition Plan: pending improvement   Consultants:   Palliative  psych  Procedures:  EEG IMPRESSION: This is Rhyland Hinderliter normal awake and drowsy electroencephalogram. There are no focal lateralizing or epileptiform features noted.  Antimicrobials:  Anti-infectives (From admission, onward)   None     Subjective: Confused.  Speaks  in g[place] at times, english at others. Knows he's in hospital, knows July.  Able to say name.  Not following commands consistently.   Objective: Vitals:   07/30/17 0033 07/30/17 0437 07/30/17 0816 07/30/17 0856  BP: (!) 164/109 (!) 164/102  (!) 182/94  Pulse: 84 82  90  Resp: 20 18  20   Temp: 97.9 F (36.6 C) (!) 97.4 F (36.3 C)  98.3 F (36.8 C)  TempSrc: Oral Oral  Oral  SpO2: 96% 97% 96% 97%  Weight: 86.1 kg (189 lb 13.1 oz)       Intake/Output Summary (Last 24 hours) at 07/30/2017 1521 Last data filed at 07/30/2017 1404 Gross per 24 hour  Intake 320 ml  Output 1650 ml  Net -1330 ml   Filed Weights   07/30/17 0033  Weight: 86.1 kg (189 lb 13.1 oz)    Examination:  General exam: Appears calm and comfortable  Respiratory system: Clear to auscultation. Respiratory effort normal. Cardiovascular system: S1 & S2 heard, RRR.  Gastrointestinal system: Abdomen is nondistended, soft and nontender.  Central nervous system: Alert and disoriented. No focal neurological deficits, moving all extremities.  Exam difficult as pt not consistently following commands. Extremities: Symmetric 5 x 5 power. Skin: No rashes, lesions or ulcers Psychiatry: Judgement and insight appear normal. Mood & affect appropriate.     Data Reviewed: I have personally reviewed following labs and imaging studies  CBC: Recent Labs  Lab 07/29/17 2030 07/30/17 0515  WBC 5.4 5.8  NEUTROABS 3.6  --   HGB 15.3 15.1  HCT 47.1 46.2  MCV 91.6 91.3  PLT 195 762   Basic Metabolic Panel: Recent Labs  Lab 07/29/17 2030 07/30/17 0515  NA 141 140  K 3.8 3.7  CL 105 106  CO2 26 23  GLUCOSE 101* 105*  BUN 13 10  CREATININE 0.96 0.93  CALCIUM 9.3 9.2   GFR: Estimated Creatinine Clearance: 56.2 mL/min (by C-G formula based on SCr of 0.93 mg/dL). Liver Function Tests: Recent Labs  Lab 07/29/17 2030  AST 31  ALT 31  ALKPHOS 116  BILITOT 0.9  PROT 6.2*  ALBUMIN 3.5   No results for input(s):  LIPASE, AMYLASE in the last 168 hours. Recent Labs  Lab 07/29/17 2030  AMMONIA 16   Coagulation Profile: No results for input(s): INR, PROTIME in the last 168 hours. Cardiac Enzymes: No results for input(s): CKTOTAL, CKMB, CKMBINDEX, TROPONINI in the last 168 hours. BNP (last 3 results) No results for input(s): PROBNP in the last 8760 hours. HbA1C: No results for input(s): HGBA1C in the last 72 hours. CBG: No results for input(s): GLUCAP in the last 168 hours. Lipid Profile: No results for input(s): CHOL, HDL, LDLCALC, TRIG, CHOLHDL, LDLDIRECT in the last 72 hours. Thyroid Function Tests: Recent Labs    07/29/17 2030  TSH 0.471   Anemia Panel: No results for input(s): VITAMINB12, FOLATE, FERRITIN, TIBC, IRON, RETICCTPCT in the last 72 hours. Sepsis Labs: No results for input(s): PROCALCITON, LATICACIDVEN in the last 168 hours.  No results found for this or any previous visit (from the past 240 hour(s)).       Radiology Studies: Dg Chest 2 View  Result Date: 07/29/2017 CLINICAL DATA:  Altered mental status. EXAM: CHEST -  2 VIEW COMPARISON:  07/15/2017 FINDINGS: Cardiomegaly with vascular congestion. Low lung volumes with bibasilar atelectasis. No effusions or acute bony abnormality. IMPRESSION: Cardiomegaly, vascular congestion. Low volumes with bibasilar atelectasis. Electronically Signed   By: Rolm Baptise M.D.   On: 07/29/2017 20:03   Ct Head Wo Contrast  Result Date: 07/29/2017 CLINICAL DATA:  Altered mental status. EXAM: CT HEAD WITHOUT CONTRAST TECHNIQUE: Contiguous axial images were obtained from the base of the skull through the vertex without intravenous contrast. COMPARISON:  CT head dated November 09, 2014. FINDINGS: Brain: No evidence of acute infarction, hemorrhage, hydrocephalus, extra-axial collection or mass lesion/mass effect. Stable atrophy and chronic microvascular ischemic changes. Vascular: Atherosclerotic vascular calcification of the carotid siphons. No  hyperdense vessel. Skull: Normal. Negative for fracture or focal lesion. Sinuses/Orbits: No acute finding. Other: None. IMPRESSION: 1.  No acute intracranial abnormality. 2. Stable atrophy and chronic microvascular ischemic changes. Electronically Signed   By: Titus Dubin M.D.   On: 07/29/2017 20:29        Scheduled Meds: . apixaban  5 mg Oral BID  . dextromethorphan-guaiFENesin  2 tablet Oral BID  . diltiazem  240 mg Oral Daily  . famotidine  20 mg Oral QHS  . feeding supplement (ENSURE ENLIVE)  237 mL Oral BID BM  . finasteride  5 mg Oral Daily  . hydrochlorothiazide  12.5 mg Oral Daily  . hydroxypropyl methylcellulose / hypromellose  1 drop Both Eyes BID  . levalbuterol  1.25 mg Nebulization TID  . lisinopril  20 mg Oral Daily  . loratadine  10 mg Oral Daily  . metoprolol tartrate  100 mg Oral BID  . mineral oil-hydrophilic petrolatum  1 application Topical TID  . QUEtiapine  12.5 mg Oral QHS  . sodium chloride flush  3 mL Intravenous Q12H  . tamsulosin  0.4 mg Oral QPC supper  . tiotropium  18 mcg Inhalation Daily  . traZODone  50 mg Oral QHS   Continuous Infusions:   LOS: 0 days    Time spent: over 35 min    Fayrene Helper, MD [facility] Pager [phone]  If 7PM-7AM, please contact night-coverage [email or web address] Password [email or web address] 07/30/2017, 3:21 PM

## 2017-07-30 NOTE — Progress Notes (Signed)
Patient attempting to get out of be and pull foley catheter out and IV out.  Daughter at bedside and unable to calm patient.  Patient kicking at staff and grabbing nurses' arms.  MD notified and soft restraints applied to wrists and Haldol 2mg  IV given stat.  Dr. Florene Glen to bedside

## 2017-07-30 NOTE — Progress Notes (Signed)
Visited with this patient, [name] [name], daughter at bedside.  Introduced myself to them and the nurse and tech in the room.  Patient returned from procedure having an adverse reaction to ativan.  The daughter still welcomed prayer for her father as he is combative.  Prayer for him at bedside and told the daughter to please have [place] paged if she needed additional support.  She said she may find [place] and made a comment about how supported she felt by one of my colleagues.    07/30/17 1227  Clinical Encounter Type  Visited With Patient and family together;Health care provider  Visit Type Initial;Psychological support;Spiritual support;Post-op  Spiritual Encounters  Spiritual Needs Prayer

## 2017-07-30 NOTE — Progress Notes (Signed)
EEG complete - results pending 

## 2017-07-30 NOTE — Progress Notes (Signed)
PT Cancellation Note  Patient Details Name: [name] [name] MRN: [id number] DOB: February 04, 1925   Cancelled Treatment:    Reason Eval/Treat Not Completed: (P) Other (comment)(agitation) Pt is currently agitated. RN requests check back tomorrow for Evaluation.  Rahaf Carbonell B. Migdalia Dk PT, DPT Acute Rehabilitation  [phone] Pager [phone]     [place] 07/30/2017, 12:51 PM

## 2017-07-30 NOTE — Progress Notes (Signed)
PALLIATIVE NOTE:  Consult received. Went to meet with daughter at bedside. Patient recently returned from MRI without completion due to agitation and aggressiveness. Nurse is at bedside with daughter. Patient continues to be agitated and combative. RN reports she has administered Haldol per Dr. Florene Glen. Daughter somewhat anxious about father's behavior.  She wanted to speak outside of the room. She reports her sister works for hospice. At this time she does not want to engage in conversations with Palliative. Advised we are not hospice focused and briefly educated her on Palliative. She was appreciative of the differences. Daughter somewhat upset regarding decision making of code status and DNR status. Daughter assured patient is a full code and not a DNR. Advised that code status was only a part of our services. Daughter verbalized understanding and appreciation for coming. She again declined Palliative services and input. Card was left at her request. She is aware if her or her sisters would like to meet she is more than welcome to ask the nurse to get [place] back in to see them or notify attending provider. She verbalized understanding.   Dr. Florene Glen present and made aware of daughter's request. We will be happy to come back and see patient and discuss of goals of care with family at later date if they desire.   Thank you for your referral.   NO CHARGE   Alda Lea, NP-BC Palliative Medicine Team  Phone: [phone] Fax: [phone] Pager: [phone] Amion: Delane Ginger. [place]

## 2017-07-30 NOTE — Progress Notes (Signed)
Patient transported to radiology for MRI of brain.  Patient's daughter at bedside and requested she assist with exam if her father has anxiety, as he has "PTSD" and does not like fireworks.  Dr. Florene Glen rounded this am and discussed Code status with daughter and patient is now a full code.

## 2017-07-30 NOTE — Progress Notes (Signed)
attempted with sedation.  When pt was moved over he became very agitated, striking and grabbing staff and not letting go when requested.  Even daughter in attendance could not get him to calm down.  Will most likely need general anesthesia to accomplish.

## 2017-07-31 ENCOUNTER — Observation Stay (HOSPITAL_COMMUNITY): Payer: Medicare Other

## 2017-07-31 DIAGNOSIS — F02818 Dementia in other diseases classified elsewhere, unspecified severity, with other behavioral disturbance: Secondary | ICD-10-CM

## 2017-07-31 DIAGNOSIS — F0281 Dementia in other diseases classified elsewhere with behavioral disturbance: Secondary | ICD-10-CM

## 2017-07-31 DIAGNOSIS — R4182 Altered mental status, unspecified: Secondary | ICD-10-CM | POA: Diagnosis not present

## 2017-07-31 DIAGNOSIS — G9340 Encephalopathy, unspecified: Secondary | ICD-10-CM | POA: Diagnosis not present

## 2017-07-31 LAB — CBC
HCT: 44.6 % (ref 39.0–52.0)
HEMOGLOBIN: 14.7 g/dL (ref 13.0–17.0)
MCH: 29.8 pg (ref 26.0–34.0)
MCHC: 33 g/dL (ref 30.0–36.0)
MCV: 90.3 fL (ref 78.0–100.0)
PLATELETS: 193 10*3/uL (ref 150–400)
RBC: 4.94 MIL/uL (ref 4.22–5.81)
RDW: 13.7 % (ref 11.5–15.5)
WBC: 5.6 10*3/uL (ref 4.0–10.5)

## 2017-07-31 LAB — URINE CULTURE: Culture: NO GROWTH

## 2017-07-31 LAB — BASIC METABOLIC PANEL
Anion gap: 7 (ref 5–15)
BUN: 16 mg/dL (ref 8–23)
CO2: 27 mmol/L (ref 22–32)
Calcium: 9.2 mg/dL (ref 8.9–10.3)
Chloride: 108 mmol/L (ref 98–111)
Creatinine, Ser: 1.2 mg/dL (ref 0.61–1.24)
GFR calc Af Amer: 60 mL/min — ABNORMAL LOW (ref >60)
GFR, EST NON AFRICAN AMERICAN: 51 mL/min — AB (ref >60)
Glucose, Bld: 88 mg/dL (ref 70–99)
Potassium: 3.6 mmol/L (ref 3.5–5.1)
SODIUM: 142 mmol/L (ref 135–145)

## 2017-07-31 LAB — MAGNESIUM: MAGNESIUM: 1.8 mg/dL (ref 1.7–2.4)

## 2017-07-31 MED ORDER — HYDROCHLOROTHIAZIDE 12.5 MG PO CAPS
12.5000 mg | ORAL_CAPSULE | Freq: Every day | ORAL | Status: DC
  Administered 2017-08-01: 12.5 mg via ORAL
  Filled 2017-07-31: qty 1

## 2017-07-31 NOTE — Consult Note (Signed)
[facility] Consult   Reason for Consult: ''History of Bipolar: psychiatric sx vs delirium.'' Referring Physician:  Dr. Florene Glen Patient Identification: [name] [name] MRN:  [id number] Principal Diagnosis: Major neurocognitive disorder due to another medical condition with behavioral disturbance Diagnosis:   Patient Active Problem List   Diagnosis Date Noted  . Major neurocognitive disorder due to another medical condition with behavioral disturbance [F02.81] 07/31/2017  . Chronic anticoagulation [Z79.01] 07/30/2017  . COPD (chronic obstructive pulmonary disease) ([place]) [J44.9] 07/30/2017  . Acute encephalopathy [G93.40] 07/29/2017  . Pressure injury of skin [L89.90] 07/13/2017  . Atrial fibrillation with RVR ([place]) [I48.91] 07/09/2017  . Acute CHF (congestive heart failure) ([place]) [I50.9] 07/09/2017  . AAA (abdominal aortic aneurysm) ([place]) [I71.4] 07/09/2017  . Acute on chronic diastolic congestive heart failure ([place]) [I50.33] 04/11/2017  . Atrial fibrillation, chronic ([place]) [I48.2] 04/11/2017  . Acute respiratory failure with hypoxia ([place]) [J96.01] 02/23/2017  . CAP (community acquired pneumonia) [J18.9] 02/23/2017  . Nausea & vomiting [R11.2] 02/23/2017  . PTSD (post-traumatic stress disorder) [F43.10]   . Vomiting and diarrhea [R11.10, R19.7] 11/09/2014  . Unstable angina ([place]) [I20.0] 05/10/2014  . Coronary artery disease due to lipid rich plaque [I25.10, I25.83]   . Angina pectoris, crescendo ([place]) [I20.0] 04/28/2014  . Contusion of left hip [S70.02XA] 07/04/2013  . Fall [W19.XXXA] 07/04/2013  . Benign prostatic hyperplasia [N40.0] 05/31/2013  . Coronary artery disease of native artery of native heart with stable angina pectoris ([place]) [I25.118] 05/31/2013  . UTI (urinary tract infection) [N39.0] 05/31/2013  . Hypercholesterolemia [E78.00] 05/31/2013  . Dysphagia, pharyngoesophageal phase [R13.14] 04/10/2013  . Frequent falls [R29.6] 04/10/2013  . Weakness [R53.1]  04/10/2013  . Hyperthyroidism, subclinical [E05.90] 04/10/2013  . GERD (gastroesophageal reflux disease) [K21.9] 04/06/2013  . Bladder outflow obstruction [N32.0] 04/06/2013  . Dehydration [E86.0] 04/05/2013  . Essential hypertension [I10] 04/05/2013  . Arthritis [M19.90] 04/05/2013  . Peripheral neuropathy [G62.9] 04/05/2013  . Gastroenteritis, acute [K52.9] 04/05/2013  . Gastroenteritis [K52.9] 04/05/2013    Total Time spent with patient: 45 minutes  Subjective:   [name] [name] is a 84 y.o. male patient admitted due to altered mental status.  HPI:  Patient is a poor historian and part of history is obtained from the chart and his treatment team. Patient with history significant for PTSD, Dementia, ?Bipolar disorder, HTN, HLD, A. fib on Eliquis, CAD(DES to LAD in 2016), hyperthyroidism and descending AAA;who was admitted due to AMS. Patient is calm, cooperative but only oriented to person and appears confused. His short term memory is impaired. He did not show any form of agitation when he was being interviewed by me. His treating physician is wondering if being diagnosed with PTSD, Bipolar is contributing to his current confusional states. However, patient has multiple medical issues as well age that may account for his condition but I am not absolutely certain. Also, it should be noted that individuals with worsening Dementia suffers a condition called sundowning-confusion in the evening hours.   Past Psychiatric History: as above  Risk to Self:   Risk to Others:   Prior Inpatient Therapy:   Prior Outpatient Therapy:    Past Medical History:  Past Medical History:  Diagnosis Date  . Anginal pain ([place])   . Arthritis    "all over"  . Cancer ([place])   . Coronary artery disease   . Depression ~ 38; ~ 1990   PTSD/Mania   . GERD (gastroesophageal reflux disease)   . High cholesterol   . Hypertension   .  Hyperthyroidism   . Neuropathy   . Osteoporosis     Past Surgical History:   Procedure Laterality Date  . BACK SURGERY    . CARDIAC CATHETERIZATION  09/2007  . CARDIAC CATHETERIZATION N/A 05/08/2014   Procedure: CORONARY STENT INTERVENTION;  Surgeon: [name], MD; 3.518 mm Xience [place] DES to the RCA   . LEFT HEART CATHETERIZATION WITH CORONARY ANGIOGRAM N/A 05/08/2014   Procedure: LEFT HEART CATHETERIZATION WITH CORONARY ANGIOGRAM;  Surgeon: [name], MD; LAD 100% (old), CFX 20%, OM1 30%, pRCA 30%, dRCA 95%, EF 55%     . LUMBAR LAMINECTOMY/DECOMPRESSION MICRODISCECTOMY  2011   Family History:  Family History  Problem Relation Age of Onset  . CAD Mother   . Asthma Mother    Family Psychiatric  History:  Social History:  Social History   Substance and Sexual Activity  Alcohol Use No  . Alcohol/week: 0.0 oz   Comment: 05/08/2014 "he's been in [facility] for ~!35 yrs"     Social History   Substance and Sexual Activity  Drug Use No    Social History   Socioeconomic History  . Marital status: Divorced    Spouse name: Not on file  . Number of children: Not on file  . Years of education: Not on file  . Highest education level: Not on file  Occupational History  . Not on file  Social Needs  . Financial resource strain: Not on file  . Food insecurity:    Worry: Not on file    Inability: Not on file  . Transportation needs:    Medical: Not on file    Non-medical: Not on file  Tobacco Use  . Smoking status: Former Smoker    Packs/day: 1.00    Years: 20.00    Pack years: 20.00    Types: Cigarettes    Last attempt to quit: 01/12/1962    Years since quitting: 55.5  . Smokeless tobacco: Never Used  Substance and Sexual Activity  . Alcohol use: No    Alcohol/week: 0.0 oz    Comment: 05/08/2014 "he's been in AA for ~!35 yrs"  . Drug use: No  . Sexual activity: Not on file  Lifestyle  . Physical activity:    Days per week: Not on file    Minutes per session: Not on file  . Stress: Not on file  Relationships  . Social connections:    Talks  on phone: Not on file    Gets together: Not on file    Attends religious service: Not on file    Active member of club or organization: Not on file    Attends meetings of clubs or organizations: Not on file    Relationship status: Not on file  Other Topics Concern  . Not on file  Social History Narrative   He was in [name] youth camps as a child. He emigrated to the [place] after World War II. He was staying with his host family in [place] when he got drafted for [place].      He now lives in [place] and his daughter helps in his care.   Additional Social History:    Allergies:   Allergies  Allergen Reactions  . Cortisone Other (See Comments)    Causes emotional problems     Labs:  Results for orders placed or performed during the hospital encounter of 07/29/17 (from the past 48 hour(s))  CBC with Differential     Status: None  Collection Time: 07/29/17  8:30 PM  Result Value Ref Range   WBC 5.4 4.0 - 10.5 K/uL   RBC 5.14 4.22 - 5.81 MIL/uL   Hemoglobin 15.3 13.0 - 17.0 g/dL   HCT 47.1 39.0 - 52.0 %   MCV 91.6 78.0 - 100.0 fL   MCH 29.8 26.0 - 34.0 pg   MCHC 32.5 30.0 - 36.0 g/dL   RDW 13.5 11.5 - 15.5 %   Platelets 195 150 - 400 K/uL   Neutrophils Relative % 67 %   Neutro Abs 3.6 1.7 - 7.7 K/uL   Lymphocytes Relative 19 %   Lymphs Abs 1.0 0.7 - 4.0 K/uL   Monocytes Relative 10 %   Monocytes Absolute 0.5 0.1 - 1.0 K/uL   Eosinophils Relative 4 %   Eosinophils Absolute 0.2 0.0 - 0.7 K/uL   Basophils Relative 0 %   Basophils Absolute 0.0 0.0 - 0.1 K/uL   Immature Granulocytes 0 %   Abs Immature Granulocytes 0.0 0.0 - 0.1 K/uL    Comment: Performed at [facility], [address] [place], [place] [address]  Comprehensive metabolic panel     Status: Abnormal   Collection Time: 07/29/17  8:30 PM  Result Value Ref Range   Sodium 141 135 - 145 mmol/L   Potassium 3.8 3.5 - 5.1 mmol/L   Chloride 105 98 - 111 mmol/L    Comment: Please note change in  reference range.   CO2 26 22 - 32 mmol/L   Glucose, Bld 101 (H) 70 - 99 mg/dL    Comment: Please note change in reference range.   BUN 13 8 - 23 mg/dL    Comment: Please note change in reference range.   Creatinine, Ser 0.96 0.61 - 1.24 mg/dL   Calcium 9.3 8.9 - 10.3 mg/dL   Total Protein 6.2 (L) 6.5 - 8.1 g/dL   Albumin 3.5 3.5 - 5.0 g/dL   AST 31 15 - 41 U/L   ALT 31 0 - 44 U/L    Comment: Please note change in reference range.   Alkaline Phosphatase 116 38 - 126 U/L   Total Bilirubin 0.9 0.3 - 1.2 mg/dL   GFR calc non Af Amer >60 >60 mL/min   GFR calc Af Amer >60 >60 mL/min    Comment: (NOTE) The eGFR has been calculated using the CKD EPI equation. This calculation has not been validated in all clinical situations. eGFR's persistently <60 mL/min signify possible Chronic Kidney Disease.    Anion gap 10 5 - 15    Comment: Performed at [facility] [address] [place], [place] [address]  Ammonia     Status: None   Collection Time: 07/29/17  8:30 PM  Result Value Ref Range   Ammonia 16 9 - 35 umol/L    Comment: Performed at [facility], [place] [address] [place], [place] [address]  TSH     Status: None   Collection Time: 07/29/17  8:30 PM  Result Value Ref Range   TSH 0.471 0.350 - 4.500 uIU/mL    Comment: Performed by a 3rd Generation assay with a functional sensitivity of <=0.01 uIU/mL. Performed at [facility], [place] [address] [place], [place] [address]   Brain natriuretic peptide     Status: Abnormal   Collection Time: 07/29/17  8:30 PM  Result Value Ref Range   B Natriuretic Peptide 157.2 (H) 0.0 - 100.0 pg/mL    Comment: Performed at  [facility], [place] [address], [place], [place] [address]  Cortisol     Status: None   Collection Time: 07/29/17  8:30 PM  Result Value Ref Range   Cortisol, Plasma 7.3 ug/dL    Comment: (NOTE) AM    6.7 - 22.6 ug/dL PM   <10.0       ug/dL Performed at [facility] [address] [place],  [place] [address]   I-Stat venous blood gas, ED     Status: Abnormal   Collection Time: 07/29/17  9:33 PM  Result Value Ref Range   pH, Ven 7.386 7.250 - 7.430   pCO2, Ven 45.2 44.0 - 60.0 mmHg   pO2, Ven 30.0 (LL) 32.0 - 45.0 mmHg   Bicarbonate 27.1 20.0 - 28.0 mmol/L   TCO2 28 22 - 32 mmol/L   O2 Saturation 57.0 %   Acid-Base Excess 1.0 0.0 - 2.0 mmol/L   Patient temperature HIDE    Sample type VENOUS    Comment NOTIFIED PHYSICIAN   Urinalysis, Routine w reflex microscopic     Status: Abnormal   Collection Time: 07/29/17 10:02 PM  Result Value Ref Range   Color, Urine YELLOW YELLOW   APPearance CLEAR CLEAR   Specific Gravity, Urine 1.011 1.005 - 1.030   pH 7.0 5.0 - 8.0   Glucose, UA NEGATIVE NEGATIVE mg/dL   Hgb urine dipstick MODERATE (A) NEGATIVE   Bilirubin Urine NEGATIVE NEGATIVE   Ketones, ur 5 (A) NEGATIVE mg/dL   Protein, ur NEGATIVE NEGATIVE mg/dL   Nitrite NEGATIVE NEGATIVE   Leukocytes, UA NEGATIVE NEGATIVE   RBC / HPF >50 (H) 0 - 5 RBC/hpf   WBC, UA 6-10 0 - 5 WBC/hpf   Bacteria, UA NONE SEEN NONE SEEN   Mucus PRESENT     Comment: Performed at [facility], [address], [place] [address]  CBC     Status: None   Collection Time: 07/30/17  5:15 AM  Result Value Ref Range   WBC 5.8 4.0 - 10.5 K/uL   RBC 5.06 4.22 - 5.81 MIL/uL   Hemoglobin 15.1 13.0 - 17.0 g/dL   HCT 46.2 39.0 - 52.0 %   MCV 91.3 78.0 - 100.0 fL   MCH 29.8 26.0 - 34.0 pg   MCHC 32.7 30.0 - 36.0 g/dL   RDW 13.5 11.5 - 15.5 %   Platelets 198 150 - 400 K/uL    Comment: Performed at [facility], [place] [address] [place], [place] [address]  Basic metabolic panel     Status: Abnormal   Collection Time: 07/30/17  5:15 AM  Result Value Ref Range   Sodium 140 135 - 145 mmol/L   Potassium 3.7 3.5 - 5.1 mmol/L   Chloride 106 98 - 111 mmol/L    Comment: Please note change in reference range.   CO2 23 22 - 32 mmol/L   Glucose, Bld 105 (H) 70 - 99 mg/dL    Comment: Please note  change in reference range.   BUN 10 8 - 23 mg/dL    Comment: Please note change in reference range.   Creatinine, Ser 0.93 0.61 - 1.24 mg/dL   Calcium 9.2 8.9 - 10.3 mg/dL   GFR calc non Af Amer >60 >60 mL/min   GFR calc Af Amer >60 >60 mL/min    Comment: (NOTE) The eGFR has been calculated using the CKD EPI equation. This calculation has not been validated in all clinical  situations. eGFR's persistently <60 mL/min signify possible Chronic Kidney Disease.    Anion gap 11 5 - 15    Comment: Performed at [facility] [address] [place] [address]  CBC     Status: None   Collection Time: 07/31/17  4:52 AM  Result Value Ref Range   WBC 5.6 4.0 - 10.5 K/uL   RBC 4.94 4.22 - 5.81 MIL/uL   Hemoglobin 14.7 13.0 - 17.0 g/dL   HCT 44.6 39.0 - 52.0 %   MCV 90.3 78.0 - 100.0 fL   MCH 29.8 26.0 - 34.0 pg   MCHC 33.0 30.0 - 36.0 g/dL   RDW 13.7 11.5 - 15.5 %   Platelets 193 150 - 400 K/uL    Comment: Performed at [facility], [place] [address], [place], [place] [address]  Basic metabolic panel     Status: Abnormal   Collection Time: 07/31/17  4:52 AM  Result Value Ref Range   Sodium 142 135 - 145 mmol/L   Potassium 3.6 3.5 - 5.1 mmol/L   Chloride 108 98 - 111 mmol/L    Comment: Please note change in reference range.   CO2 27 22 - 32 mmol/L   Glucose, Bld 88 70 - 99 mg/dL    Comment: Please note change in reference range.   BUN 16 8 - 23 mg/dL    Comment: Please note change in reference range.   Creatinine, Ser 1.20 0.61 - 1.24 mg/dL   Calcium 9.2 8.9 - 10.3 mg/dL   GFR calc non Af Amer 51 (L) >60 mL/min   GFR calc Af Amer 60 (L) >60 mL/min    Comment: (NOTE) The eGFR has been calculated using the CKD EPI equation. This calculation has not been validated in all clinical situations. eGFR's persistently <60 mL/min signify possible Chronic Kidney Disease.    Anion gap 7 5 - 15    Comment: Performed at [facility] [address], [place], [place]  [address]  Magnesium     Status: None   Collection Time: 07/31/17  4:52 AM  Result Value Ref Range   Magnesium 1.8 1.7 - 2.4 mg/dL    Comment: Performed at [facility] [address], [place], [place] [address]    Current Facility-Administered Medications  Medication Dose Route Frequency Provider Last Rate Last Dose  . acetaminophen (TYLENOL) tablet 650 mg  650 mg Oral Q6H PRN Fuller Plan A, MD   650 mg at 07/30/17 0400   Or  . acetaminophen (TYLENOL) suppository 650 mg  650 mg Rectal Q6H PRN Fuller Plan A, MD      . apixaban (ELIQUIS) tablet 5 mg  5 mg Oral BID Fuller Plan A, MD   5 mg at 07/31/17 1008  . dextromethorphan-guaiFENesin (MUCINEX DM) 30-600 MG per 12 hr tablet 2 tablet  2 tablet Oral BID Fuller Plan A, MD   2 tablet at 07/31/17 1006  . diltiazem (CARDIZEM CD) 24 hr capsule 240 mg  240 mg Oral Daily Smith, Rondell A, MD   240 mg at 07/31/17 1007  . famotidine (PEPCID) tablet 20 mg  20 mg Oral QHS Smith, Rondell A, MD   20 mg at 07/30/17 2221  . feeding supplement (ENSURE ENLIVE) (ENSURE ENLIVE) liquid 237 mL  237 mL Oral BID BM Smith, Rondell A, MD   237 mL at 07/30/17 1221  . finasteride (PROSCAR) tablet 5 mg  5 mg Oral Daily Smith, Rondell  A, MD   5 mg at 07/31/17 1006  . haloperidol lactate (HALDOL) injection 2 mg  2 mg Intravenous Q6H PRN Elodia Florence., MD   2 mg at 07/31/17 0057  . hydrochlorothiazide (MICROZIDE) capsule 12.5 mg  12.5 mg Oral Daily Elodia Florence., MD   12.5 mg at 07/31/17 1009  . hydroxypropyl methylcellulose / hypromellose (ISOPTO TEARS / GONIOVISC) 2.5 % ophthalmic solution 1 drop  1 drop Both Eyes BID Fuller Plan A, MD   1 drop at 07/31/17 1134  . labetalol (NORMODYNE,TRANDATE) injection 10 mg  10 mg Intravenous Q2H PRN Elodia Florence., MD      . levalbuterol [facility]) nebulizer solution 0.63 mg  0.63 mg Nebulization Q3H PRN Fuller Plan A, MD      . levalbuterol Penne Lash) nebulizer solution 1.25 mg  1.25 mg  Nebulization TID Fuller Plan A, MD   1.25 mg at 07/30/17 2012  . lisinopril (PRINIVIL,ZESTRIL) tablet 20 mg  20 mg Oral Daily Smith, Rondell A, MD   20 mg at 07/31/17 1007  . loratadine (CLARITIN) tablet 10 mg  10 mg Oral Daily Tamala Julian, Rondell A, MD   10 mg at 07/31/17 1007  . metoprolol tartrate (LOPRESSOR) tablet 100 mg  100 mg Oral BID Fuller Plan A, MD   100 mg at 07/31/17 1007  . mineral oil-hydrophilic petrolatum (AQUAPHOR) ointment 1 application  1 application Topical TID Norval Morton, MD   1 application at 01/60/10 1133  . ondansetron (ZOFRAN) tablet 4 mg  4 mg Oral Q6H PRN Fuller Plan A, MD       Or  . ondansetron (ZOFRAN) injection 4 mg  4 mg Intravenous Q6H PRN Smith, Rondell A, MD      . QUEtiapine (SEROQUEL) tablet 12.5 mg  12.5 mg Oral QHS Elodia Florence., MD   12.5 mg at 07/30/17 2220  . sodium chloride flush (NS) 0.9 % injection 3 mL  3 mL Intravenous Q12H Smith, Rondell A, MD   3 mL at 07/31/17 1010  . tamsulosin (FLOMAX) capsule 0.4 mg  0.4 mg Oral QPC supper Fuller Plan A, MD   0.4 mg at 07/30/17 1718  . tiotropium (SPIRIVA) inhalation capsule 18 mcg  18 mcg Inhalation Daily Fuller Plan A, MD   18 mcg at 07/31/17 0927  . traZODone (DESYREL) tablet 50 mg  50 mg Oral QHS Fuller Plan A, MD   50 mg at 07/30/17 2221    Musculoskeletal: Strength & Muscle Tone: not tested Gait & Station: unable to stand Patient leans: N/A  Psychiatric Specialty Exam: Physical Exam  Psychiatric: His affect is blunt. His speech is delayed. He is slowed and withdrawn. Cognition and memory are impaired. He expresses impulsivity.    Review of Systems  Constitutional: Negative.   Gastrointestinal: Negative.   Skin: Negative.   Endo/Heme/Allergies: Negative.   Psychiatric/Behavioral: Positive for memory loss.    Blood pressure 94/74, pulse 82, temperature (!) 97.5 F (36.4 C), temperature source Oral, resp. rate 20, weight 86 kg (189 lb 9.5 oz), SpO2 94 %.Body mass  index is 26.44 kg/m.  General Appearance: Casual  Eye Contact:  Minimal  Speech:  Slow  Volume:  Decreased  Mood:  Euthymic  Affect:  Constricted  Thought Process:  Coherent  Orientation:  Other:  only to person  Thought Content:  Illogical  Suicidal Thoughts:  unable to assess  Homicidal Thoughts:  unable to assess  Memory:  Immediate;   Poor Recent;  Poor Remote;   Fair  Judgement:  Impaired  Insight:  Shallow  Psychomotor Activity:  Psychomotor Retardation  Concentration:  Concentration: Fair and Attention Span: Fair  Recall:  Poor  Fund of Knowledge:  Poor  Language:  Fair  Akathisia:  No  Handed:  Right  AIMS (if indicated):     Assets:  Housing Social Support  ADL's:  Impaired  Cognition:  Impaired,  Moderate  Sleep:        Treatment Plan Summary: 84 year old man who was admitted due to altered mental status that has been getting worse in the last few weeks. Confusion/disorientation probably due to worsening Dementia and other medical issues. I doubt very much if patient confusion is due to PTSD/Bipolar disorder.  Recommendations: -Consider Neuro consult. -Consider Cognitive psychological testing(only done outpatient). -Continue Haldol prn due to agitation. -Continue Low dose Trazodone at bedtime for sleep/mood -Consider weaning patient off Seroquel if confusion gets worse or patient is excessively sedated. -Psych service is signing out, no acute psychiatric intervention necessary at this time.   Disposition: -Re-consult psych as needed.  Corena Pilgrim, MD 07/31/2017 3:53 PM

## 2017-07-31 NOTE — NC FL2 (Signed)
[place] LEVEL OF CARE SCREENING TOOL     IDENTIFICATION  Patient Name: [name] [name] Birthdate: [birth year] Sex: male Admission Date (Current Location): 07/29/2017  [facility] and [place] Number:  Herbalist and Address:  [facility] [place] [address], [place], [place] [address]      Provider Number: [id number]  Attending Physician Name and Address:  Elodia Florence., *  Relative Name and Phone Number:  [phone] [facility]    Current Level of Care: Hospital Recommended Level of Care: [place] Prior Approval Number:    Date Approved/Denied:   PASRR Number: [id number]  Discharge Plan: SNF    Current Diagnoses: Patient Active Problem List   Diagnosis Date Noted  . Chronic anticoagulation 07/30/2017  . COPD (chronic obstructive pulmonary disease) ([place]) 07/30/2017  . Acute encephalopathy 07/29/2017  . Pressure injury of skin 07/13/2017  . Atrial fibrillation with RVR ([place]) 07/09/2017  . Acute CHF (congestive heart failure) ([place]) 07/09/2017  . AAA (abdominal aortic aneurysm) ([place]) 07/09/2017  . Acute on chronic diastolic congestive heart failure ([place]) 04/11/2017  . Atrial fibrillation, chronic ([place]) 04/11/2017  . Acute respiratory failure with hypoxia ([place]) 02/23/2017  . CAP (community acquired pneumonia) 02/23/2017  . Nausea & vomiting 02/23/2017  . PTSD (post-traumatic stress disorder)   . Vomiting and diarrhea 11/09/2014  . Unstable angina ([place]) 05/10/2014  . Coronary artery disease due to lipid rich plaque   . Angina pectoris, crescendo ([place]) 04/28/2014  . Contusion of left hip 07/04/2013  . Fall 07/04/2013  . Benign prostatic hyperplasia 05/31/2013  . Coronary artery disease of native artery of native heart with stable angina pectoris ([place]) 05/31/2013  . UTI (urinary tract infection) 05/31/2013  . Hypercholesterolemia 05/31/2013  . Dysphagia, pharyngoesophageal phase 04/10/2013  . Frequent falls  04/10/2013  . Weakness 04/10/2013  . Hyperthyroidism, subclinical 04/10/2013  . GERD (gastroesophageal reflux disease) 04/06/2013  . Bladder outflow obstruction 04/06/2013  . Dehydration 04/05/2013  . Essential hypertension 04/05/2013  . Arthritis 04/05/2013  . Peripheral neuropathy 04/05/2013  . Gastroenteritis, acute 04/05/2013  . Gastroenteritis 04/05/2013    Orientation RESPIRATION BLADDER Height & Weight     Self  Normal Incontinent(chronic catheter) Weight: 189 lb 9.5 oz (86 kg) Height:     BEHAVIORAL SYMPTOMS/MOOD NEUROLOGICAL BOWEL NUTRITION STATUS      Incontinent Diet(Heart Diet, Thin fluids)  AMBULATORY STATUS COMMUNICATION OF NEEDS Skin   Supervision   Normal                       Personal Care Assistance Level of Assistance  Bathing, Feeding, Dressing Bathing Assistance: Limited assistance Feeding assistance: Limited assistance Dressing Assistance: Limited assistance Total Care Assistance: Limited assistance   Functional Limitations Info  Sight, Hearing, Speech Sight Info: Impaired Hearing Info: Impaired Speech Info: Impaired    SPECIAL CARE FACTORS FREQUENCY        PT Frequency: 2x OT Frequency: 2x            Contractures Contractures Info: Not present    Additional Factors Info  Code Status Code Status Info: Full Code Allergies Info: Cortisone           Current Medications (07/31/2017):  This is the current hospital active medication list Current Facility-Administered Medications  Medication Dose Route Frequency Provider Last Rate Last Dose  . acetaminophen (TYLENOL) tablet 650 mg  650 mg Oral Q6H PRN Fuller Plan A, MD   650 mg at 07/30/17  0400   Or  . acetaminophen (TYLENOL) suppository 650 mg  650 mg Rectal Q6H PRN Fuller Plan A, MD      . apixaban (ELIQUIS) tablet 5 mg  5 mg Oral BID Fuller Plan A, MD   5 mg at 07/31/17 1008  . dextromethorphan-guaiFENesin (MUCINEX DM) 30-600 MG per 12 hr tablet 2 tablet  2 tablet Oral  BID Fuller Plan A, MD   2 tablet at 07/31/17 1006  . diltiazem (CARDIZEM CD) 24 hr capsule 240 mg  240 mg Oral Daily Smith, Rondell A, MD   240 mg at 07/31/17 1007  . famotidine (PEPCID) tablet 20 mg  20 mg Oral QHS Smith, Rondell A, MD   20 mg at 07/30/17 2221  . feeding supplement (ENSURE ENLIVE) (ENSURE ENLIVE) liquid 237 mL  237 mL Oral BID BM Smith, Rondell A, MD   237 mL at 07/30/17 1221  . finasteride (PROSCAR) tablet 5 mg  5 mg Oral Daily Smith, Rondell A, MD   5 mg at 07/31/17 1006  . haloperidol lactate (HALDOL) injection 2 mg  2 mg Intravenous Q6H PRN Elodia Florence., MD   2 mg at 07/31/17 0057  . hydrochlorothiazide (MICROZIDE) capsule 12.5 mg  12.5 mg Oral Daily Elodia Florence., MD   12.5 mg at 07/31/17 1009  . hydroxypropyl methylcellulose / hypromellose (ISOPTO TEARS / GONIOVISC) 2.5 % ophthalmic solution 1 drop  1 drop Both Eyes BID Tamala Julian, Rondell A, MD   1 drop at 07/30/17 2222  . labetalol (NORMODYNE,TRANDATE) injection 10 mg  10 mg Intravenous Q2H PRN Elodia Florence., MD      . levalbuterol [facility]) nebulizer solution 0.63 mg  0.63 mg Nebulization Q3H PRN Fuller Plan A, MD      . levalbuterol Penne Lash) nebulizer solution 1.25 mg  1.25 mg Nebulization TID Fuller Plan A, MD   1.25 mg at 07/30/17 2012  . lisinopril (PRINIVIL,ZESTRIL) tablet 20 mg  20 mg Oral Daily Smith, Rondell A, MD   20 mg at 07/31/17 1007  . loratadine (CLARITIN) tablet 10 mg  10 mg Oral Daily Tamala Julian, Rondell A, MD   10 mg at 07/31/17 1007  . metoprolol tartrate (LOPRESSOR) tablet 100 mg  100 mg Oral BID Fuller Plan A, MD   100 mg at 07/31/17 1007  . mineral oil-hydrophilic petrolatum (AQUAPHOR) ointment 1 application  1 application Topical TID Norval Morton, MD   1 application at 03/50/09 2223  . ondansetron (ZOFRAN) tablet 4 mg  4 mg Oral Q6H PRN Fuller Plan A, MD       Or  . ondansetron (ZOFRAN) injection 4 mg  4 mg Intravenous Q6H PRN Smith, Rondell A, MD      .  QUEtiapine (SEROQUEL) tablet 12.5 mg  12.5 mg Oral QHS Elodia Florence., MD   12.5 mg at 07/30/17 2220  . sodium chloride flush (NS) 0.9 % injection 3 mL  3 mL Intravenous Q12H Smith, Rondell A, MD   3 mL at 07/31/17 1010  . tamsulosin (FLOMAX) capsule 0.4 mg  0.4 mg Oral QPC supper Fuller Plan A, MD   0.4 mg at 07/30/17 1718  . tiotropium (SPIRIVA) inhalation capsule 18 mcg  18 mcg Inhalation Daily Fuller Plan A, MD   18 mcg at 07/31/17 0927  . traZODone (DESYREL) tablet 50 mg  50 mg Oral QHS Fuller Plan A, MD   50 mg at 07/30/17 2221     Discharge Medications: Please see  discharge summary for a list of discharge medications.  Relevant Imaging Results:  Relevant Lab Results:   Additional Information SN: [id number]  Glendale Chard, LCSW

## 2017-07-31 NOTE — Care Management Obs Status (Signed)
[place] NOTIFICATION   Patient Details  Name: [name] [name] MRN: [id number] Date of Birth: Sep 17, 1925   Medicare Observation Status Notification Given:  Yes    Bethena Roys, RN 07/31/2017, 3:39 PM

## 2017-07-31 NOTE — Progress Notes (Addendum)
PROGRESS NOTE    [name] [name]  MLY:[id number] DOB: [birth year] DOA: 07/29/2017 PCP: [name], MD   Brief Narrative:   [name] [name] is a 84 y.o. male with medical history significant of HTN, HLD, A. fib on Eliquis, CAD(DES to LAD in 2016), hyperthyroidism, PTSD, and descending AAA; who presents after being noted to be more altered than baseline.  Normally patient is reportedly very lucid and alert and oriented up until approximately 1 to 2 months ago. Daughter is present at bedside and helps provide additional history.  She makes note that her father has a history of PTSD from time in [place] during the war for which prednisone has triggered episodes of abnormal behavior in her father.  Patient was hospitalized back in February and after being treated with prednisone for a month had a prolonged hypomanic episode.  In July he was admitted and placed on prednisone for approximately 3 days.  He was initially more emotional, giggly, and talkative than normal for few days.  Thereafter, patient had difficulty playing his normal board games and was began talking in [place] more than he normally does.  Patient had also complained of headache, had poor appetite, more lethargic, and was refusing his meds.  His daughter question the possibility of urinary tract infection or prednisone still being in his system.  Patient denies all complaints at this time.  Assessment & Plan:   Active Problems:   Benign prostatic hyperplasia   PTSD (post-traumatic stress disorder)   Atrial fibrillation, chronic (HCC)   Acute encephalopathy   Chronic anticoagulation   COPD (chronic obstructive pulmonary disease) (HCC)   Acute encephalopathy: Patient reportedly acting abnormal speaking in [place] and refusing medications.  Also having difficulty eating as well. Family notes he's had episodes of AMS when previously prescribed prednisone (on multiple occasions), though this was last over 1 week ago.  Family notes he has  history of bipolar/psychiatry illness and is concerned this maybe contributing.  Improved today and yesterday after haldol (required restraints briefly yesterday).   - Workup so far unremarkable.  Previously normal B12, RPR, folate, TSH, HIV.  VBG with normal PCO2.  Ammonia normal.  Random cortisol 7.3, but seems to have improved, low suspicion for adrenal insufficiency as cause.  Head CT without acute abnormality, stable atrophy and chronic microvascular ischemic changes. - UA with blood, but foley in place -> will send for urine culture (pending) - MRI without acute intracranial abnormality - BP's improved, follow - EEG was normal - Continue haldol prn and seroquel nightly - I suspect this is delirium due to underlying dementia, but will follow up MRI as noted above.  Will consult psych given concerns for possible contribution from previous psych history (bipolar/PTSD).  Atrial fibrillation on chronic anticoagulation: Stable. - Continue Cardizem and Eliquis  COPD, without acute exacerbation - Continue Spiriva and levalbuterol nebs TID  Chronic diastolic CHF: stable, euvolemic  Essential hypertension - Improved, now BP's soft, continue to monitor with addition of HCTZ.  Continue lisinopril and metoprolol.    AKI: creatinine bumped to 1.2 with HCTZ, follow closely.  BPH : Patient has indwelling Foley catheter. - Continue Flomax and Proscar - needs urology f/u  PTSD  Goals of Care: family upset pt DNR.  C/s palliative to discuss and made pt full code again after discussion.  When palliative came by, pt was agitated.  Daughter canceled consult.  Plan for this as outpatient.  DVT prophylaxis: eliquis Code Status: full  Family Communication: daughters at bedside Disposition Plan:  pending improvement, likely d/c tomorrow.   Consultants:   Palliative  psych  Procedures:  EEG IMPRESSION: This is a normal awake and drowsy electroencephalogram. There are no focal  lateralizing or epileptiform features noted.  Antimicrobials:  Anti-infectives (From admission, onward)   None     Subjective: Says "He feels with his hands" (joking) A&Ox2.  Seems better this morning.  Objective: Vitals:   07/30/17 2027 07/30/17 2058 07/31/17 0612 07/31/17 1205  BP: 132/85  (!) 115/47 94/74  Pulse: 78  67 82  Resp: 18  18 20   Temp: 97.7 F (36.5 C)  (!) 97.5 F (36.4 C) (!) 97.5 F (36.4 C)  TempSrc: Oral  Oral Oral  SpO2: 92% 97% 98% 94%  Weight:   86 kg (189 lb 9.5 oz)     Intake/Output Summary (Last 24 hours) at 07/31/2017 1542 Last data filed at 07/31/2017 1044 Gross per 24 hour  Intake 840 ml  Output 450 ml  Net 390 ml   Filed Weights   07/30/17 0033 07/31/17 0612  Weight: 86.1 kg (189 lb 13.1 oz) 86 kg (189 lb 9.5 oz)    Examination:  General: No acute distress.  Sitting up, eating breakfast. Cardiovascular: Heart sounds show a regular rate, and rhythm.  Lungs: Clear to auscultation bilaterally with good air movement.  Abdomen: Soft, nontender, nondistended Neurological: Alert and oriented 2. Moves all extremities 4. Cranial nerves II through XII grossly intact. Skin: Warm and dry. No rashes or lesions. Extremities: No clubbing or cyanosis. No edema.  Psychiatric: Mood and affect are normal. Insight and judgment are appropriate.    Data Reviewed: I have personally reviewed following labs and imaging studies  CBC: Recent Labs  Lab 07/29/17 2030 07/30/17 0515 07/31/17 0452  WBC 5.4 5.8 5.6  NEUTROABS 3.6  --   --   HGB 15.3 15.1 14.7  HCT 47.1 46.2 44.6  MCV 91.6 91.3 90.3  PLT 195 198 818   Basic Metabolic Panel: Recent Labs  Lab 07/29/17 2030 07/30/17 0515 07/31/17 0452  NA 141 140 142  K 3.8 3.7 3.6  CL 105 106 108  CO2 26 23 27   GLUCOSE 101* 105* 88  BUN 13 10 16   CREATININE 0.96 0.93 1.20  CALCIUM 9.3 9.2 9.2  MG  --   --  1.8   GFR: Estimated Creatinine Clearance: 43.6 mL/min (by C-G formula based on SCr  of 1.2 mg/dL). Liver Function Tests: Recent Labs  Lab 07/29/17 2030  AST 31  ALT 31  ALKPHOS 116  BILITOT 0.9  PROT 6.2*  ALBUMIN 3.5   No results for input(s): LIPASE, AMYLASE in the last 168 hours. Recent Labs  Lab 07/29/17 2030  AMMONIA 16   Coagulation Profile: No results for input(s): INR, PROTIME in the last 168 hours. Cardiac Enzymes: No results for input(s): CKTOTAL, CKMB, CKMBINDEX, TROPONINI in the last 168 hours. BNP (last 3 results) No results for input(s): PROBNP in the last 8760 hours. HbA1C: No results for input(s): HGBA1C in the last 72 hours. CBG: No results for input(s): GLUCAP in the last 168 hours. Lipid Profile: No results for input(s): CHOL, HDL, LDLCALC, TRIG, CHOLHDL, LDLDIRECT in the last 72 hours. Thyroid Function Tests: Recent Labs    07/29/17 2030  TSH 0.471   Anemia Panel: No results for input(s): VITAMINB12, FOLATE, FERRITIN, TIBC, IRON, RETICCTPCT in the last 72 hours. Sepsis Labs: No results for input(s): PROCALCITON, LATICACIDVEN in the last 168 hours.  Recent Results (from the past 240  hour(s))  Culture, Urine     Status: None   Collection Time: 07/29/17  3:49 PM  Result Value Ref Range Status   Specimen Description URINE, RANDOM  Final   Special Requests NONE  Final   Culture   Final    NO GROWTH Performed at [facility], [address], [place], [place] [address]    Report Status 07/31/2017 FINAL  Final         Radiology Studies: Dg Chest 2 View  Result Date: 07/29/2017 CLINICAL DATA:  Altered mental status. EXAM: CHEST - 2 VIEW COMPARISON:  07/15/2017 FINDINGS: Cardiomegaly with vascular congestion. Low lung volumes with bibasilar atelectasis. No effusions or acute bony abnormality. IMPRESSION: Cardiomegaly, vascular congestion. Low volumes with bibasilar atelectasis. Electronically Signed   By: Rolm Baptise M.D.   On: 07/29/2017 20:03   Ct Head Wo Contrast  Result Date: 07/29/2017 CLINICAL DATA:  Altered  mental status. EXAM: CT HEAD WITHOUT CONTRAST TECHNIQUE: Contiguous axial images were obtained from the base of the skull through the vertex without intravenous contrast. COMPARISON:  CT head dated November 09, 2014. FINDINGS: Brain: No evidence of acute infarction, hemorrhage, hydrocephalus, extra-axial collection or mass lesion/mass effect. Stable atrophy and chronic microvascular ischemic changes. Vascular: Atherosclerotic vascular calcification of the carotid siphons. No hyperdense vessel. Skull: Normal. Negative for fracture or focal lesion. Sinuses/Orbits: No acute finding. Other: None. IMPRESSION: 1.  No acute intracranial abnormality. 2. Stable atrophy and chronic microvascular ischemic changes. Electronically Signed   By: Titus Dubin M.D.   On: 07/29/2017 20:29   Mr Brain Wo Contrast  Result Date: 07/31/2017 CLINICAL DATA:  Initial evaluation for acute altered mental status. EXAM: MRI HEAD WITHOUT CONTRAST TECHNIQUE: Multiplanar, multiecho pulse sequences of the brain and surrounding structures were obtained without intravenous contrast. COMPARISON:  Prior CT from 07/29/2017 FINDINGS: Brain: Generalized age-related cerebral atrophy with mild chronic small vessel ischemic disease. No evidence for acute infarct. Gray-white matter differentiation maintained. No encephalomalacia to suggest chronic infarction. Single punctate chronic microhemorrhage noted within the right cerebellar hemisphere, of doubtful significance in isolation. No other evidence for acute or chronic intracranial hemorrhage. No mass lesion, midline shift or mass effect. No hydrocephalus. No extra-axial fluid collection. Pituitary gland within normal limits. Vascular: Major intracranial vascular flow voids are maintained. Skull and upper cervical spine: Craniocervical junction normal. Upper cervical spine normal. Bone marrow signal intensity within normal limits. No scalp soft tissue abnormality. Sinuses/Orbits: Globes and orbital soft  tissues within normal limits. Mild chronic mucoperiosteal thickening within the ethmoidal air cells. Paranasal sinuses are otherwise clear. No mastoid effusion. Inner ear structures normal. Other: None. IMPRESSION: 1. No acute intracranial abnormality. 2. Age-related cerebral atrophy with mild chronic small vessel ischemic disease. Electronically Signed   By: Jeannine Boga M.D.   On: 07/31/2017 03:32        Scheduled Meds: . apixaban  5 mg Oral BID  . dextromethorphan-guaiFENesin  2 tablet Oral BID  . diltiazem  240 mg Oral Daily  . famotidine  20 mg Oral QHS  . feeding supplement (ENSURE ENLIVE)  237 mL Oral BID BM  . finasteride  5 mg Oral Daily  . hydrochlorothiazide  12.5 mg Oral Daily  . hydroxypropyl methylcellulose / hypromellose  1 drop Both Eyes BID  . levalbuterol  1.25 mg Nebulization TID  . lisinopril  20 mg Oral Daily  . loratadine  10 mg Oral Daily  . metoprolol tartrate  100 mg Oral BID  . mineral oil-hydrophilic petrolatum  1 application Topical TID  . QUEtiapine  12.5 mg Oral QHS  . sodium chloride flush  3 mL Intravenous Q12H  . tamsulosin  0.4 mg Oral QPC supper  . tiotropium  18 mcg Inhalation Daily  . traZODone  50 mg Oral QHS   Continuous Infusions:   LOS: 0 days    Time spent: over 35 min    Fayrene Helper, MD [facility] Pager [phone]  If 7PM-7AM, please contact night-coverage [email or web address] Password [facility] 07/31/2017, 3:42 PM

## 2017-07-31 NOTE — Plan of Care (Signed)
  Problem: Pain Managment: Goal: General experience of comfort will improve Outcome: Completed/Met

## 2017-07-31 NOTE — Evaluation (Signed)
Clinical/Bedside Swallow Evaluation Patient Details  Name: [name] [name] MRN: [id number] Date of Birth: 05-29-1925  Today's Date: 07/31/2017 Time: SLP Start Time (ACUTE ONLY): 1011 SLP Stop Time (ACUTE ONLY): 1025 SLP Time Calculation (min) (ACUTE ONLY): 14 min  Past Medical History:  Past Medical History:  Diagnosis Date  . Anginal pain ([place])   . Arthritis    "all over"  . Cancer ([place])   . Coronary artery disease   . Depression ~ 22; ~ 1990   PTSD/[place]ia   . GERD (gastroesophageal reflux disease)   . High cholesterol   . Hypertension   . Hyperthyroidism   . Neuropathy   . Osteoporosis    Past Surgical History:  Past Surgical History:  Procedure Laterality Date  . BACK SURGERY    . CARDIAC CATHETERIZATION  09/2007  . CARDIAC CATHETERIZATION N/A 05/08/2014   Procedure: CORONARY STENT INTERVENTION;  Surgeon: [name], MD; 3.518 mm Xience [place] DES to the RCA   . LEFT HEART CATHETERIZATION WITH CORONARY ANGIOGRAM N/A 05/08/2014   Procedure: LEFT HEART CATHETERIZATION WITH CORONARY ANGIOGRAM;  Surgeon: [name], MD; LAD 100% (old), CFX 20%, OM1 30%, pRCA 30%, dRCA 95%, EF 55%     . LUMBAR LAMINECTOMY/DECOMPRESSION MICRODISCECTOMY  2011   HPI:  84 yo with recent admission with ARF c hypoxia, COPD exacerbation and rhinovirus URI, now admitted with acute encephalopathy of unknown etiology. PMH: HTN, CAD, pna, GERD, hypothyroidism, dementia, AF, AAA. Outpatient MBS 07/01/17 for upgrade from mechanical soft restrictions at facility > moderate tongue/vallecular residue, trace silent aspiration during consecutive straw sips from trace pyriform sinus residue spill into airway; reg/thin, no straws recommended. Evaluated at bedside 07/14/17 with regular texture, thin liquids, small sips via straw if desired recommended, with no SLP f/u. Moderate dysarthria was reported as baseline. MRI with no acute findings, CXR showed cardiomegaly, vascular congestion. Low volumes with bibasilar  atelectasis.   Assessment / Plan / Recommendation Clinical Impression   Patient presents with mild-moderately increased respiratory effort at baseline. RN administering medications whole in puree, which were well- tolerated, however pt with mildly wet vocal quality after 4 oz puree given, presumably due to buildup of vallecular residue noted on MBS. Vocal quality clears with sip of thin liquid for clearance. Immediate coughing with dry, crumbly solid, likely due to increased respiratory effort at baseline. Anticipate with adequate rest breaks pt able to maintain full airway protection. Continue regular diet with thin liquids, will follow briefly for tolerance and education as pt willing to consume limited amount of POs this date and daughter not present for education. Swallow precautions posted: slow rate, take rest breaks, sips of thin liquids between solids. Upright during and after PO intake. D/w RN.   SLP Visit Diagnosis: Dysphagia, unspecified (R13.10)    Aspiration Risk  Moderate aspiration risk    Diet Recommendation Regular;Thin liquid   Liquid Administration via: Cup Medication Administration: Other (Comment)(as preferred by pt) Supervision: Patient able to self feed;Full supervision/cueing for compensatory strategies Compensations: Slow rate;Minimize environmental distractions;Small sips/bites;Follow solids with liquid Postural Changes: Seated upright at 90 degrees;Remain upright for at least 30 minutes after po intake    Other  Recommendations Oral Care Recommendations: Oral care BID   Follow up Recommendations None      Frequency and Duration min 1 x/week  1 week       Prognosis Prognosis for Safe Diet Advancement: Good      Swallow Study   General Date of Onset: 07/29/17 HPI: 84 yo  with recent admission with ARF c hypoxia, COPD exacerbation and rhinovirus URI, now admitted with acute encephalopathy of unknown etiology. PMH: HTN, CAD, pna, GERD, hypothyroidism,  dementia, AF, AAA. Outpatient MBS 07/01/17 for upgrade from mechanical soft restrictions at facility > moderate tongue/vallecular residue, trace silent aspiration during consecutive straw sips from trace pyriform sinus residue spill into airway; reg/thin, no straws recommended. Evaluated at bedside 07/14/17 with regular texture, thin liquids, small sips via straw if desired recommended, with no SLP f/u. Moderate dysarthria was reported as baseline. MRI with no acute findings, CXR showed cardiomegaly, vascular congestion. Low volumes with bibasilar atelectasis. Type of Study: Bedside Swallow Evaluation Previous Swallow Assessment: see HPI Diet Prior to this Study: Regular;Thin liquids Temperature Spikes Noted: No Respiratory Status: Nasal cannula History of Recent Intubation: No Behavior/Cognition: Alert;Cooperative Oral Cavity Assessment: Within Functional Limits Oral Care Completed by SLP: No Oral Cavity - Dentition: Adequate natural dentition Vision: Functional for self-feeding Self-Feeding Abilities: Able to feed self Patient Positioning: Upright in bed Baseline Vocal Quality: Low vocal intensity Volitional Cough: Strong Volitional Swallow: Able to elicit    Oral/Motor/Sensory Function Overall Oral Motor/Sensory Function: Within functional limits   Ice Chips Ice chips: Not tested   Thin Liquid Thin Liquid: Within functional limits Presentation: Cup(pt declined straw)    Nectar Thick Nectar Thick Liquid: Not tested   Honey Thick Honey Thick Liquid: Not tested   Puree Puree: Impaired Pharyngeal Phase Impairments: Wet Vocal Quality(after multiple swallows puree)   Solid   GO   Solid: Impaired Presentation: Self Fed Pharyngeal Phase Impairments: Cough - Immediate       [name] Lever, MS, CCC-SLP Speech-Language Pathologist [phone]  Aliene Altes 07/31/2017,10:29 AM

## 2017-07-31 NOTE — Evaluation (Signed)
Physical Therapy Evaluation Patient Details Name: [name] [name] MRN: [id number] DOB: 1925/12/11 Today's Date: 07/31/2017   History of Present Illness  Pt adm with acute encephalopathy of unknown etiology. PMH - HTN, CAD, afib, AAA, dementia, copd, ptsd, bipolar  Clinical Impression  Pt admitted with above diagnosis and presents to PT with functional limitations due to deficits listed below (See PT problem list). Pt needs skilled PT to maximize independence and safety to allow discharge back to SNF for further rehab.      Follow Up Recommendations SNF    Equipment Recommendations  None recommended by PT    Recommendations for Other Services       Precautions / Restrictions Precautions Precautions: Fall Restrictions Weight Bearing Restrictions: No      Mobility  Bed Mobility Overal bed mobility: Needs Assistance Bed Mobility: Supine to Sit;Sit to Supine;Rolling Rolling: Mod assist   Supine to sit: +2 for physical assistance;Min assist Sit to supine: +2 for physical assistance;Mod assist   General bed mobility comments: Assist to elevate trunk into sitting. Assist to lower trunk to supine and bring legs back up into bed.  Transfers Overall transfer level: Needs assistance Equipment used: Ambulation equipment used Transfers: Sit to/from Stand Sit to Stand: +2 physical assistance;Mod assist         General transfer comment: Assist to bring hips and trunk up into standing using Stedy. Verbal/tactile cues to stand more erect.Unable to stand with 2 person hand-held assist.  Ambulation/Gait             General Gait Details: Per old chart pt doesn't amb at baseline  Stairs            Wheelchair Mobility    Modified Rankin (Stroke Patients Only)       Balance Overall balance assessment: Needs assistance Sitting-balance support: Single extremity supported;Feet supported;No upper extremity supported Sitting balance-Leahy Scale: Zero Sitting balance -  Comments: At times pt sitting staticly with supervision but then pt requires min assist due to anterior lean.   Standing balance support: Bilateral upper extremity supported Standing balance-Leahy Scale: Poor Standing balance comment: Stood x 2 with Stedy for 30-60 seconds with +2 min assist to maintain.                             Pertinent Vitals/Pain Pain Assessment: No/denies pain    Home Living Family/patient expects to be discharged to:: [facility]: [facility] - 4 wheels;Wheelchair - [employer];Shower seat;Grab bars - toilet;Hospital bed      Prior Function Level of Independence: Needs assistance   Gait / Transfers Assistance Needed: Per prior chart pt has not been ambulatory recently. Using w/c prior to June admission. Since June admission pt has required 2 person assist for transfers.            Hand Dominance   Dominant Hand: Right    Extremity/Trunk Assessment   Upper Extremity Assessment Upper Extremity Assessment: Defer to OT evaluation    Lower Extremity Assessment Lower Extremity Assessment: Generalized weakness       Communication   Communication: HOH;Other (comment)(speaking [place] at times)  Cognition Arousal/Alertness: Awake/alert Behavior During Therapy: Impulsive Overall Cognitive Status: Impaired/Different from baseline Area of Impairment: Attention;Following commands;Safety/judgement;Problem solving                   Current Attention Level: Focused  Following Commands: Follows one step commands inconsistently Safety/Judgement: Decreased awareness of safety;Decreased awareness of deficits   Problem Solving: Slow processing;Decreased initiation;Difficulty sequencing;Requires verbal cues;Requires tactile cues General Comments: baseline dementia      General Comments      Exercises     Assessment/Plan    PT Assessment Patient needs continued PT services  PT  Problem List Decreased strength;Decreased activity tolerance;Decreased balance;Decreased mobility;Decreased knowledge of use of DME;Decreased cognition;Decreased safety awareness       PT Treatment Interventions DME instruction;Functional mobility training;Therapeutic exercise;Therapeutic activities;Balance training;Patient/family education    PT Goals (Current goals can be found in the Care Plan section)  Acute Rehab PT Goals Patient Stated Goal: Pt unable to state PT Goal Formulation: Patient unable to participate in goal setting Time For Goal Achievement: 08/14/17 Potential to Achieve Goals: Fair    Frequency Min 2X/week   Barriers to discharge        Co-evaluation PT/OT/SLP Co-Evaluation/Treatment: Yes Reason for Co-Treatment: For patient/therapist safety PT goals addressed during session: Mobility/safety with mobility         AM-PAC PT "6 Clicks" Daily Activity  Outcome Measure Difficulty turning over in bed (including adjusting bedclothes, sheets and blankets)?: Unable Difficulty moving from lying on back to sitting on the side of the bed? : Unable Difficulty sitting down on and standing up from a chair with arms (e.g., wheelchair, bedside commode, etc,.)?: Unable Help needed moving to and from a bed to chair (including a wheelchair)?: Total Help needed walking in hospital room?: Total Help needed climbing 3-5 steps with a railing? : Total 6 Click Score: 6    End of Session Equipment Utilized During Treatment: Gait belt Activity Tolerance: Patient limited by fatigue Patient left: in bed;with call bell/phone within reach;with bed alarm set;with nursing/sitter in room Nurse Communication: Mobility status;Need for lift equipment PT Visit Diagnosis: Other abnormalities of gait and mobility (R26.89);Muscle weakness (generalized) (M62.81);Difficulty in walking, not elsewhere classified (R26.2)    Time: 6606-3016 PT Time Calculation (min) (ACUTE ONLY): 23  min   Charges:   PT Evaluation $PT Eval Moderate Complexity: 1 Mod     PT G CodesMarland Kitchen        [facility] [place] 07/31/2017, 9:29 AM

## 2017-07-31 NOTE — Evaluation (Signed)
Occupational Therapy Evaluation Patient Details Name: [name] [name] MRN: [id number] DOB: Jan 01, 1926 Today's Date: 07/31/2017    History of Present Illness Pt admitted with acute encephalopathy of unknown etiology. PMH - HTN, CAD, afib, AAA, dementia, copd, ptsd, bipolar   Clinical Impression   Pt admitted for above. Feel pt will benefit from acute OT to increase independence and strength prior to d/c. Recommending SNF for d/c.     Follow Up Recommendations  SNF;Supervision/Assistance - 24 hour    Equipment Recommendations  Other (comment)(defer to next venue)    Recommendations for Other Services       Precautions / Restrictions Precautions Precautions: Fall Restrictions Weight Bearing Restrictions: No      Mobility Bed Mobility Overal bed mobility: Needs Assistance Bed Mobility: Supine to Sit;Sit to Supine;Rolling Rolling: Mod assist   Supine to sit: +2 for physical assistance;Min assist Sit to supine: +2 for physical assistance;Mod assist   General bed mobility comments: Assist to elevate trunk into sitting. Assist to lower trunk to supine and bring legs back up into bed.  Transfers Overall transfer level: Needs assistance Equipment used: Ambulation equipment used Transfers: Sit to/from Stand Sit to Stand: +2 physical assistance;Mod assist         General transfer comment: Assist to bring hips and trunk up into standing using Stedy. Verbal/tactile cues to stand more erect.Unable to stand with 2 person hand-held assist.    Balance Overall balance assessment: Needs assistance Sitting-balance support: Single extremity supported;Feet supported;No upper extremity supported Sitting balance-Leahy Scale: Zero Sitting balance - Comments: At times pt sitting staticly with supervision but then pt requires min assist due to anterior lean.   Standing balance support: Bilateral upper extremity supported Standing balance-Leahy Scale: Poor Standing balance comment: Stood x 2  with Stedy for 30-60 seconds with +2 min assist to maintain.                           ADL either performed or assessed with clinical judgement   ADL Overall ADL's : Needs assistance/impaired Eating/Feeding: Bed level;Set up;Supervision/ safety   Grooming: Wash/dry face;Bed level;Minimal assistance(assist to wash more thoroughly)               Lower Body Dressing: +2 for physical assistance;Sit to/from stand;Maximal assistance   Toilet Transfer: +2 for physical assistance;Moderate assistance(sit to stand with stedy)           Functional mobility during ADLs: +2 for physical assistance;Moderate assistance(stedy) General ADL Comments: Pt doffed socks sitting EOB with Min guard assist but did not don them as pt was leaning far anteriorly.      Vision         Perception     Praxis      Pertinent Vitals/Pain Pain Assessment: No/denies pain     Hand Dominance Right   Extremity/Trunk Assessment Upper Extremity Assessment Upper Extremity Assessment: Difficult to assess due to impaired cognition   Lower Extremity Assessment Lower Extremity Assessment: Defer to PT evaluation       Communication Communication Communication: HOH;Other (comment);Expressive difficulties(speaking [place] at times)   Cognition Arousal/Alertness: Lethargic Behavior During Therapy: Impulsive Overall Cognitive Status: No family/caregiver present to determine baseline cognitive functioning Area of Impairment: Attention;Following commands;Safety/judgement;Problem solving                   Current Attention Level: Focused   Following Commands: Follows one step commands inconsistently Safety/Judgement: Decreased awareness of safety;Decreased awareness of deficits  Problem Solving: Slow processing;Decreased initiation;Difficulty sequencing;Requires verbal cues;Requires tactile cues General Comments: baseline dementia   General Comments       Exercises     Shoulder  Instructions      Home Living Family/patient expects to be discharged to:: Skilled nursing facility                             Home Equipment: [facility] - 4 wheels;Wheelchair - [employer];Shower seat;Grab bars - toilet;Hospital bed          Prior Functioning/Environment Level of Independence: Needs assistance  Gait / Transfers Assistance Needed: Per prior chart pt has not been ambulatory recently. Using w/c prior to June admission. Since June admission pt has required 2 person assist for transfers.               OT Problem List: Decreased strength;Decreased activity tolerance;Decreased cognition;Decreased safety awareness;Decreased knowledge of use of DME or AE;Decreased knowledge of precautions;Impaired balance (sitting and/or standing)      OT Treatment/Interventions: Self-care/ADL training;DME and/or AE instruction;Therapeutic activities;Cognitive remediation/compensation;Patient/family education;Balance training    OT Goals(Current goals can be found in the care plan section) Acute Rehab OT Goals Patient Stated Goal: Pt unable to state OT Goal Formulation: Patient unable to participate in goal setting Time For Goal Achievement: 08/07/17 Potential to Achieve Goals: Fair ADL Goals Pt Will Perform Grooming: with set-up;with supervision;sitting Pt Will Transfer to Toilet: with +2 assist;with min assist;bedside commode Pt Will Perform Toileting - Clothing Manipulation and hygiene: with 2+ total assist;with min assist  OT Frequency: Min 2X/week   Barriers to D/C:            Co-evaluation PT/OT/SLP Co-Evaluation/Treatment: Yes Reason for Co-Treatment: For patient/therapist safety PT goals addressed during session: Mobility/safety with mobility OT goals addressed during session: ADL's and self-care;Other (comment)(mobility)      AM-PAC PT "6 Clicks" Daily Activity     Outcome Measure Help from another person eating meals?: A Little Help from  another person taking care of personal grooming?: A Little Help from another person toileting, which includes using toliet, bedpan, or urinal?: A Lot Help from another person bathing (including washing, rinsing, drying)?: A Lot Help from another person to put on and taking off regular upper body clothing?: A Lot Help from another person to put on and taking off regular lower body clothing?: A Lot 6 Click Score: 14   End of Session Equipment Utilized During Treatment: Gait belt;Other (comment)(stedy) Nurse Communication: Mobility status  Activity Tolerance: Patient tolerated treatment well Patient left: in bed;with call bell/phone within reach;with bed alarm set  OT Visit Diagnosis: Muscle weakness (generalized) (M62.81)                Time: 3612-2449 OT Time Calculation (min): 27 min Charges:  OT General Charges $OT Visit: 1 Visit OT Evaluation $OT Eval Moderate Complexity: 1 Mod G-Codes:     Hue Frick L Kimberlynn Lumbra OTR/L 07/31/2017, 10:11 AM

## 2017-08-01 DIAGNOSIS — J811 Chronic pulmonary edema: Secondary | ICD-10-CM | POA: Diagnosis not present

## 2017-08-01 DIAGNOSIS — Z955 Presence of coronary angioplasty implant and graft: Secondary | ICD-10-CM | POA: Diagnosis not present

## 2017-08-01 DIAGNOSIS — I5033 Acute on chronic diastolic (congestive) heart failure: Secondary | ICD-10-CM | POA: Diagnosis not present

## 2017-08-01 DIAGNOSIS — I482 Chronic atrial fibrillation: Secondary | ICD-10-CM | POA: Diagnosis not present

## 2017-08-01 DIAGNOSIS — Z7401 Bed confinement status: Secondary | ICD-10-CM | POA: Diagnosis not present

## 2017-08-01 DIAGNOSIS — R0902 Hypoxemia: Secondary | ICD-10-CM | POA: Diagnosis not present

## 2017-08-01 DIAGNOSIS — R000 Tachycardia, unspecified: Secondary | ICD-10-CM | POA: Diagnosis not present

## 2017-08-01 DIAGNOSIS — R0600 Dyspnea, unspecified: Secondary | ICD-10-CM | POA: Diagnosis not present

## 2017-08-01 DIAGNOSIS — I5032 Chronic diastolic (congestive) heart failure: Secondary | ICD-10-CM | POA: Diagnosis not present

## 2017-08-01 DIAGNOSIS — I25118 Atherosclerotic heart disease of native coronary artery with other forms of angina pectoris: Secondary | ICD-10-CM | POA: Diagnosis not present

## 2017-08-01 DIAGNOSIS — I5043 Acute on chronic combined systolic (congestive) and diastolic (congestive) heart failure: Secondary | ICD-10-CM | POA: Diagnosis present

## 2017-08-01 DIAGNOSIS — J90 Pleural effusion, not elsewhere classified: Secondary | ICD-10-CM | POA: Diagnosis not present

## 2017-08-01 DIAGNOSIS — I700 Atherosclerosis of aorta: Secondary | ICD-10-CM | POA: Diagnosis present

## 2017-08-01 DIAGNOSIS — M6281 Muscle weakness (generalized): Secondary | ICD-10-CM | POA: Diagnosis not present

## 2017-08-01 DIAGNOSIS — R262 Difficulty in walking, not elsewhere classified: Secondary | ICD-10-CM | POA: Diagnosis not present

## 2017-08-01 DIAGNOSIS — I2510 Atherosclerotic heart disease of native coronary artery without angina pectoris: Secondary | ICD-10-CM | POA: Diagnosis not present

## 2017-08-01 DIAGNOSIS — Z7901 Long term (current) use of anticoagulants: Secondary | ICD-10-CM | POA: Diagnosis not present

## 2017-08-01 DIAGNOSIS — Z87891 Personal history of nicotine dependence: Secondary | ICD-10-CM | POA: Diagnosis not present

## 2017-08-01 DIAGNOSIS — I110 Hypertensive heart disease with heart failure: Secondary | ICD-10-CM | POA: Diagnosis not present

## 2017-08-01 DIAGNOSIS — G629 Polyneuropathy, unspecified: Secondary | ICD-10-CM | POA: Diagnosis present

## 2017-08-01 DIAGNOSIS — F0281 Dementia in other diseases classified elsewhere with behavioral disturbance: Secondary | ICD-10-CM | POA: Diagnosis not present

## 2017-08-01 DIAGNOSIS — J9601 Acute respiratory failure with hypoxia: Secondary | ICD-10-CM | POA: Diagnosis not present

## 2017-08-01 DIAGNOSIS — I160 Hypertensive urgency: Secondary | ICD-10-CM | POA: Diagnosis not present

## 2017-08-01 DIAGNOSIS — E785 Hyperlipidemia, unspecified: Secondary | ICD-10-CM | POA: Diagnosis present

## 2017-08-01 DIAGNOSIS — R1312 Dysphagia, oropharyngeal phase: Secondary | ICD-10-CM | POA: Diagnosis not present

## 2017-08-01 DIAGNOSIS — F309 Manic episode, unspecified: Secondary | ICD-10-CM | POA: Diagnosis not present

## 2017-08-01 DIAGNOSIS — I2583 Coronary atherosclerosis due to lipid rich plaque: Secondary | ICD-10-CM | POA: Diagnosis present

## 2017-08-01 DIAGNOSIS — R41841 Cognitive communication deficit: Secondary | ICD-10-CM | POA: Diagnosis not present

## 2017-08-01 DIAGNOSIS — N400 Enlarged prostate without lower urinary tract symptoms: Secondary | ICD-10-CM | POA: Diagnosis not present

## 2017-08-01 DIAGNOSIS — I481 Persistent atrial fibrillation: Secondary | ICD-10-CM | POA: Diagnosis present

## 2017-08-01 DIAGNOSIS — F4310 Post-traumatic stress disorder, unspecified: Secondary | ICD-10-CM | POA: Diagnosis present

## 2017-08-01 DIAGNOSIS — R05 Cough: Secondary | ICD-10-CM | POA: Diagnosis not present

## 2017-08-01 DIAGNOSIS — E538 Deficiency of other specified B group vitamins: Secondary | ICD-10-CM | POA: Diagnosis not present

## 2017-08-01 DIAGNOSIS — E0590 Thyrotoxicosis, unspecified without thyrotoxic crisis or storm: Secondary | ICD-10-CM | POA: Diagnosis present

## 2017-08-01 DIAGNOSIS — R0689 Other abnormalities of breathing: Secondary | ICD-10-CM | POA: Diagnosis not present

## 2017-08-01 DIAGNOSIS — M2550 Pain in unspecified joint: Secondary | ICD-10-CM | POA: Diagnosis not present

## 2017-08-01 DIAGNOSIS — I4510 Unspecified right bundle-branch block: Secondary | ICD-10-CM | POA: Diagnosis present

## 2017-08-01 DIAGNOSIS — K219 Gastro-esophageal reflux disease without esophagitis: Secondary | ICD-10-CM | POA: Diagnosis present

## 2017-08-01 DIAGNOSIS — R339 Retention of urine, unspecified: Secondary | ICD-10-CM | POA: Diagnosis not present

## 2017-08-01 DIAGNOSIS — E039 Hypothyroidism, unspecified: Secondary | ICD-10-CM | POA: Diagnosis not present

## 2017-08-01 DIAGNOSIS — R062 Wheezing: Secondary | ICD-10-CM | POA: Diagnosis not present

## 2017-08-01 DIAGNOSIS — J449 Chronic obstructive pulmonary disease, unspecified: Secondary | ICD-10-CM | POA: Diagnosis not present

## 2017-08-01 DIAGNOSIS — G9340 Encephalopathy, unspecified: Secondary | ICD-10-CM | POA: Diagnosis not present

## 2017-08-01 DIAGNOSIS — F0280 Dementia in other diseases classified elsewhere without behavioral disturbance: Secondary | ICD-10-CM | POA: Diagnosis not present

## 2017-08-01 DIAGNOSIS — F329 Major depressive disorder, single episode, unspecified: Secondary | ICD-10-CM | POA: Diagnosis present

## 2017-08-01 DIAGNOSIS — F319 Bipolar disorder, unspecified: Secondary | ICD-10-CM | POA: Diagnosis not present

## 2017-08-01 DIAGNOSIS — J810 Acute pulmonary edema: Secondary | ICD-10-CM | POA: Diagnosis not present

## 2017-08-01 DIAGNOSIS — I480 Paroxysmal atrial fibrillation: Secondary | ICD-10-CM | POA: Diagnosis not present

## 2017-08-01 DIAGNOSIS — Z888 Allergy status to other drugs, medicaments and biological substances status: Secondary | ICD-10-CM | POA: Diagnosis not present

## 2017-08-01 DIAGNOSIS — M1990 Unspecified osteoarthritis, unspecified site: Secondary | ICD-10-CM | POA: Diagnosis present

## 2017-08-01 DIAGNOSIS — I471 Supraventricular tachycardia: Secondary | ICD-10-CM | POA: Diagnosis present

## 2017-08-01 DIAGNOSIS — Z85828 Personal history of other malignant neoplasm of skin: Secondary | ICD-10-CM | POA: Diagnosis not present

## 2017-08-01 DIAGNOSIS — I714 Abdominal aortic aneurysm, without rupture: Secondary | ICD-10-CM | POA: Diagnosis present

## 2017-08-01 DIAGNOSIS — E7800 Pure hypercholesterolemia, unspecified: Secondary | ICD-10-CM | POA: Diagnosis not present

## 2017-08-01 DIAGNOSIS — M810 Age-related osteoporosis without current pathological fracture: Secondary | ICD-10-CM | POA: Diagnosis present

## 2017-08-01 DIAGNOSIS — I10 Essential (primary) hypertension: Secondary | ICD-10-CM | POA: Diagnosis not present

## 2017-08-01 DIAGNOSIS — N401 Enlarged prostate with lower urinary tract symptoms: Secondary | ICD-10-CM | POA: Diagnosis not present

## 2017-08-01 LAB — BASIC METABOLIC PANEL
Anion gap: 7 (ref 5–15)
BUN: 18 mg/dL (ref 8–23)
CHLORIDE: 107 mmol/L (ref 98–111)
CO2: 27 mmol/L (ref 22–32)
CREATININE: 1.05 mg/dL (ref 0.61–1.24)
Calcium: 9.3 mg/dL (ref 8.9–10.3)
Glucose, Bld: 96 mg/dL (ref 70–99)
Potassium: 3.4 mmol/L — ABNORMAL LOW (ref 3.5–5.1)
SODIUM: 141 mmol/L (ref 135–145)

## 2017-08-01 LAB — MAGNESIUM: MAGNESIUM: 1.8 mg/dL (ref 1.7–2.4)

## 2017-08-01 MED ORDER — POTASSIUM CHLORIDE CRYS ER 20 MEQ PO TBCR: 40.0000 meq | EXTENDED_RELEASE_TABLET | Freq: Once | ORAL | Status: DC

## 2017-08-01 MED ORDER — QUETIAPINE FUMARATE 25 MG PO TABS: 12.5000 mg | ORAL_TABLET | Freq: Every evening | ORAL | 0 refills | Status: DC | PRN

## 2017-08-01 NOTE — Progress Notes (Signed)
Clinical Social Worker facilitated patient discharge including contacting patient family and facility([facility]) to confirm patient discharge plans.  Clinical information faxed to facility and family agreeable with plan.  CSW arranged ambulance transport via [facility] to [facility] .  RN to call for report prior to discharge [phone].  Clinical Social Worker will sign off for now as social work intervention is no longer needed. Please consult [place] again if new need arises.  Leo Rod, MSW [phone]

## 2017-08-01 NOTE — Progress Notes (Signed)
Discharge instructions given to patient and his daughter at bedside all questions answered. Still waiting for case management and social worker to call me before the patient can go to the SNF.

## 2017-08-01 NOTE — Plan of Care (Signed)
  Problem: Spiritual Needs Goal: Ability to function at adequate level 08/01/2017 0425 by Evert Kohl, RN Outcome: Progressing 08/01/2017 0422 by Evert Kohl, RN Outcome: Progressing

## 2017-08-01 NOTE — Plan of Care (Signed)
  Problem: Spiritual Needs Goal: Ability to function at adequate level Outcome: Progressing   

## 2017-08-01 NOTE — Discharge Summary (Addendum)
Physician Discharge Summary  [name] [name] STM:[id number] DOB: July 28, 1925 DOA: 07/29/2017  PCP: [name], MD  Admit date: 07/29/2017 Discharge date: 08/01/2017  Time spent: over 40 minutes  Recommendations for Outpatient Follow-up:  1. Follow up outpatient CBC/CMP 2. Continue delirium precautions as outpatient.  Pt improving, but still sleepy today.  Will stop nightly seroquel (will prescribed to be used prn) (has trazodone ordered nightly for sleep).  Neurology referral placed for dementia.   3. Please have palliative care follow at skilled nursing facility.  Pt previously DNR, but family wanted this reversed when he came here, they we're aware of previous discussions for this.  Would recommend goals of care conversation regarding code status as well as filling out a MOST form with family.   4. Pt daughter discussed use of flutter valve with respiratory therapy.  Recommending 10x before meals (or three times daily).  Would like this continued at SNF.  Would like to consider pulm consult as outpatient. 5. 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 fib on Eliquis, CAD(DES to LAD in 2016), hyperthyroidism, PTSD,and descending AAA;who presents after being noted to be more altered than baseline.  Normally patient is reportedly very lucid and alert and oriented up until approximately 1 to 2 months ago. Daughter is present at bedside and helps provide additional history. She makes note that her father has a history of PTSD from time in [place] during the war for which prednisone has triggered episodes of abnormal behavior in her father. Patient was hospitalized back in February and after being treated with prednisone for a month had a prolonged hypomanic episode. InJuly he was admitted and placed on prednisone for approximately 3 days. He was initially more emotional, giggly, and talkative than normal for few days. Thereafter,patient had difficulty playing his normal board games and was began talking in [place] more than he normally does. Patient had also complained of headache, had poor appetite, morelethargic, and was refusing his meds. His daughter question the possibility of urinary tract infection or prednisone still being in his system. Patient denies all complaints at this time.  He was admitted for acute encephalopathy thought due to delirium in setting of dementia.  He had extensive workup which was essentially unremarkable.  He improved after receiving prn haldol and nightly seroquel.  Due to his psych history, psych was consulted.  Patient was overall improved on day of discharge.  He was sleepy, but responded to questioning appropriately and was improved from presentation (hopefully with discharge from hospital environment he will continue to improve).  Episode suspected due to delirium in the setting of dementia.  Plan to discharge with seroquel prn and outpatient neurology follow up.   See below for additional details  Hospital Course:  Acute encephalopathy: Thought 2/2 delirium related to dementia.  Patient reportedly acting abnormal speaking in  [place] and refusing medications.  Also having difficulty eating as well. Family notes he's had episodes of AMS when previously  prescribed prednisone (on multiple occasions), though this was last over 1 week ago.  Family notes he has history of bipolar/psychiatry illness and is concerned this maybe contributing.  Required restraints briefly on hospital day 1, but improved after haldol/seroquel.  On day of discharge, he's sleepy, but responds appropriately, A&Ox2.  Given negative w/u and suspicion for delirium in setting of dementia, plan for discharge back to SNF.  Hopefully with d/c from hospital environment as well as delirium precautions we'll see continued improvement.  - Workup so far unremarkable.  Previously normal B12, RPR, folate, TSH, HIV.  VBG with normal PCO2.  Ammonia normal.  Random cortisol 7.3, but pt seems to have improved and low suspicion for adrenal insufficiency as cause.  Head CT without acute abnormality, stable atrophy and chronic microvascular ischemic changes. - UA with blood, but foley in place -> will send for urine culture (NG) -MRI without acute intracranial abnormality - BP's improved - EEG was normal - Continue haldol prn and seroquel nightly -> given sleepiness today, will stop nightly seroquel and discharge with seroquel prn agitation (we discussed risks of antipsychotic use in dementia).   - Psych c/s -> low suspicion related to PTSD/bipolar.  Recommended considering neuro c/s as well as cognitive psych testing.  Recommended haldol prn for agitation and low dose trazodone at bedtime for sleep/mood.  Recommended considering weaning seroquel if confusion worse or pt excessively sedated.  Atrial fibrillation on chronic anticoagulation:Stable. -Continue Cardizem and Eliquis  COPD, without acute exacerbation -Continue Spiriva and levalbuterol nebsTID  Chronic diastolic TZG:YFVCBS, euvolemic  Essential hypertension - Continue lisinopril and metoprolol.  Added HCTZ briefly here, but BP's improved, will discontinue at discharge.  Continue to monitor BP's.  AKI: creatinine bumped to  1.2 with HCTZ, improved.  HCTZ d/c'd at discharge.  BPH: Patient hasindwelling Foley catheter. -Continue Flomax and Proscar - needs urology f/u  PTSD  Goals of Care: family upset pt DNR.  C/s palliative to discuss and made pt full code again after discussion.  When palliative came by, pt was agitated and daughter canceled consult.  Would recommend f/u as outpatient.  Procedures:  IMPRESSION: This is a normal awake and drowsy electroencephalogram. There are no focal lateralizing or epileptiform features noted.  Consultations:  Psych  Discharge Exam: Vitals:   08/01/17 0458 08/01/17 0707  BP: 132/74   Pulse: 75 76  Resp: 20 20  Temp: 98.4 F (36.9 C)   SpO2: 97% 96%   A&Ox2 Denies pain. Asking for foley care.  General: No acute distress.  sleepy Cardiovascular: Heart sounds show a regular rate, and rhythm.  Lungs: Clear to auscultation bilaterally with good air movement.  Abdomen: Soft, nontender, nondistended  Neurological: Alert and oriented 2, drowsy this AM. Moves all extremities 4. Cranial nerves II through XII grossly intact. Skin: Warm and dry. No rashes or lesions. Extremities: No clubbing or cyanosis. No edema.  Psychiatric: Mood and affect are normal. Insight and judgment are appropriate.  Discharge Instructions   Discharge Instructions    Ambulatory referral to Neurology   Complete by:  As directed    An appointment is requested in approximately: 1 week   Call MD for:  difficulty breathing, headache or visual disturbances   Complete by:  As directed    Call MD for:  extreme fatigue   Complete by:  As directed    Call MD  for:  persistant dizziness or light-headedness   Complete by:  As directed    Call MD for:  persistant nausea and vomiting   Complete by:  As directed    Call MD for:  severe uncontrolled pain   Complete by:  As directed    Call MD for:  temperature >100.4   Complete by:  As directed    Diet - low sodium heart healthy    Complete by:  As directed    Discharge diet:   Complete by:  As directed    Regular;Thin liquid   Liquid Administration via: Cup Medication Administration: Other (Comment)(as preferred by pt) Supervision: Patient able to self feed;Full supervision/cueing for compensatory strategies Compensations: Slow rate;Minimize environmental distractions;Small sips/bites;Follow solids with liquid Postural Changes: Seated upright at 90 degrees;Remain upright for at least 30 minutes after po intake   Discharge instructions   Complete by:  As directed    [name] [name] was seen for altered mental status.    This is likely due to delirium associated with dementia.  [name] [name] improved throughout the hospitalization.  No concerning findings seen on workup.  [name] [name] was sleepy on the day of discharge, but had overall improved.  Continue to try to establish a normal routine, we'll stop the seroquel at night for now, but we'll prescribe seroquel to be used as needed for agitation.  We'll make a referral to neurology for dementia.  Please follow up with urology as an outpatient as planned.   We'll have palliative care see you as an outpatient to discuss goals of care.   Return for new, recurrent, or worsening symptoms.  Please ask your PCP to request records from this hospitalization so they know what was done and what the next steps will be.   Increase activity slowly   Complete by:  As directed      Allergies as of 08/01/2017      Reactions   Cortisone Other (See Comments)   Causes emotional problems       Medication List    STOP taking these medications   atorvastatin 10 MG tablet Commonly known as:  LIPITOR     TAKE these medications   acetaminophen 500 MG tablet Commonly known as:  TYLENOL Take 500-1,000 mg by mouth every 6 (six) hours as needed for mild pain.   apixaban 5 MG Tabs tablet Commonly known as:  ELIQUIS Take 1 tablet (5 mg total) by mouth 2 (two) times daily.   B-12  COMPLIANCE INJECTION 1000 MCG/ML Kit Generic drug:  Cyanocobalamin Inject 1,000 mcg as directed See admin instructions. On the 20th of every month   Carboxymethylcellulose Sod PF 1 % Gel Place 1 drop into both eyes 2 (two) times daily.   DERMAPHOR Oint Apply 1 application topically 3 (three) times daily. To penis   dextromethorphan-guaiFENesin 30-600 MG 12hr tablet Commonly known as:  MUCINEX DM Take 2 tablets by mouth 2 (two) times daily.   diltiazem 240 MG 24 hr capsule Commonly known as:  CARDIZEM CD Take 1 capsule (240 mg total) by mouth daily.   finasteride 5 MG tablet Commonly known as:  PROSCAR Take 5 mg by mouth daily.   isosorbide mononitrate 30 MG 24 hr tablet Commonly known as:  IMDUR Take 30 mg by mouth daily.   levalbuterol 1.25 MG/0.5ML nebulizer solution Commonly known as:  XOPENEX Take 1.25 mg by nebulization 3 (three) times daily.   levalbuterol 0.63 MG/3ML nebulizer solution Commonly known as:  XOPENEX Take  3 mLs (0.63 mg total) by nebulization every 3 (three) hours as needed for wheezing.   lisinopril 20 MG tablet Commonly known as:  PRINIVIL,ZESTRIL Take 1 tablet (20 mg total) by mouth daily.   loratadine 10 MG tablet Commonly known as:  CLARITIN Take 1 tablet (10 mg total) by mouth daily.   metoprolol tartrate 100 MG tablet Commonly known as:  LOPRESSOR Take 100 mg by mouth 2 (two) times daily.   pantoprazole 40 MG tablet Commonly known as:  PROTONIX Take 1 tablet (40 mg total) by mouth daily.   polyethylene glycol packet Commonly known as:  MIRALAX / GLYCOLAX Take 17 g by mouth 2 (two) times daily.   PRESERVISION AREDS 2+MULTI VIT Caps Take 1 capsule by mouth 2 (two) times daily.   QUEtiapine 25 MG tablet Commonly known as:  SEROQUEL Take 0.5 tablets (12.5 mg total) by mouth at bedtime as needed for up to 7 days (agitation or delirium).   ranitidine 150 MG tablet Commonly known as:  ZANTAC Take 300 mg by mouth at bedtime.    tamsulosin 0.4 MG Caps capsule Commonly known as:  FLOMAX Take 1 capsule (0.4 mg total) by mouth daily after supper.   tiotropium 18 MCG inhalation capsule Commonly known as:  SPIRIVA HANDIHALER Place 1 capsule (18 mcg total) into inhaler and inhale daily.   traZODone 50 MG tablet Commonly known as:  DESYREL Take 50 mg by mouth at bedtime.   Vitamin D (Cholecalciferol) 1000 units Tabs Take 1,000 Units by mouth at bedtime.      Allergies  Allergen Reactions  . Cortisone Other (See Comments)    Causes emotional problems       The results of significant diagnostics from this hospitalization (including imaging, microbiology, ancillary and laboratory) are listed below for reference.    Significant Diagnostic Studies: Dg Chest 2 View  Result Date: 07/29/2017 CLINICAL DATA:  Altered mental status. EXAM: CHEST - 2 VIEW COMPARISON:  07/15/2017 FINDINGS: Cardiomegaly with vascular congestion. Low lung volumes with bibasilar atelectasis. No effusions or acute bony abnormality. IMPRESSION: Cardiomegaly, vascular congestion. Low volumes with bibasilar atelectasis. Electronically Signed   By: Rolm Baptise M.D.   On: 07/29/2017 20:03   Ct Head Wo Contrast  Result Date: 07/29/2017 CLINICAL DATA:  Altered mental status. EXAM: CT HEAD WITHOUT CONTRAST TECHNIQUE: Contiguous axial images were obtained from the base of the skull through the vertex without intravenous contrast. COMPARISON:  CT head dated November 09, 2014. FINDINGS: Brain: No evidence of acute infarction, hemorrhage, hydrocephalus, extra-axial collection or mass lesion/mass effect. Stable atrophy and chronic microvascular ischemic changes. Vascular: Atherosclerotic vascular calcification of the carotid siphons. No hyperdense vessel. Skull: Normal. Negative for fracture or focal lesion. Sinuses/Orbits: No acute finding. Other: None. IMPRESSION: 1.  No acute intracranial abnormality. 2. Stable atrophy and chronic microvascular ischemic  changes. Electronically Signed   By: Titus Dubin M.D.   On: 07/29/2017 20:29   Ct Chest Wo Contrast  Result Date: 07/15/2017 CLINICAL DATA:  Shortness of breath, weakness EXAM: CT CHEST WITHOUT CONTRAST TECHNIQUE: Multidetector CT imaging of the chest was performed following the standard protocol without IV contrast. COMPARISON:  07/15/2017 chest x-ray.  Chest CT 05/12/2017 FINDINGS: Cardiovascular: Moderate aortic atherosclerosis. Distal descending thoracic aortic aneurysm measures 5.1 cm compared to 5.4 cm previously. Heart is upper limits normal in size. Advanced coronary artery calcifications, most pronounced in the left coronary arteries. Mediastinum/Nodes: No mediastinal, hilar, or axillary adenopathy. Lungs/Pleura: Continued interstitial thickening noted in the lung bases,  stable since prior CT. No effusions. Small calcified granulomas in the right middle lobe and left lower lobe. Upper Abdomen: Imaging into the upper abdomen shows no acute findings. Stable low-density lesions within the visualized liver, likely cysts. Musculoskeletal: Chest wall soft tissues are unremarkable. No acute bony abnormality. IMPRESSION: Continued interstitial thickening in the lung bases could reflect atelectasis or scarring. This is stable since prior CT. 5.1 cm distal descending thoracic aortic aneurysm. Advanced coronary artery disease. Aortic Atherosclerosis (ICD10-I70.0). Electronically Signed   By: Rolm Baptise M.D.   On: 07/15/2017 09:24   Mr Brain Wo Contrast  Result Date: 07/31/2017 CLINICAL DATA:  Initial evaluation for acute altered mental status. EXAM: MRI HEAD WITHOUT CONTRAST TECHNIQUE: Multiplanar, multiecho pulse sequences of the brain and surrounding structures were obtained without intravenous contrast. COMPARISON:  Prior CT from 07/29/2017 FINDINGS: Brain: Generalized age-related cerebral atrophy with mild chronic small vessel ischemic disease. No evidence for acute infarct. Gray-white matter  differentiation maintained. No encephalomalacia to suggest chronic infarction. Single punctate chronic microhemorrhage noted within the right cerebellar hemisphere, of doubtful significance in isolation. No other evidence for acute or chronic intracranial hemorrhage. No mass lesion, midline shift or mass effect. No hydrocephalus. No extra-axial fluid collection. Pituitary gland within normal limits. Vascular: Major intracranial vascular flow voids are maintained. Skull and upper cervical spine: Craniocervical junction normal. Upper cervical spine normal. Bone marrow signal intensity within normal limits. No scalp soft tissue abnormality. Sinuses/Orbits: Globes and orbital soft tissues within normal limits. Mild chronic mucoperiosteal thickening within the ethmoidal air cells. Paranasal sinuses are otherwise clear. No mastoid effusion. Inner ear structures normal. Other: None. IMPRESSION: 1. No acute intracranial abnormality. 2. Age-related cerebral atrophy with mild chronic small vessel ischemic disease. Electronically Signed   By: Jeannine Boga M.D.   On: 07/31/2017 03:32   Dg Chest Port 1 View  Result Date: 07/15/2017 CLINICAL DATA:  Cough and shortness of breath EXAM: PORTABLE CHEST 1 VIEW COMPARISON:  07/14/2017 FINDINGS: Cardiac shadow remains enlarged. Aortic calcifications are again seen. Bibasilar changes are noted and stable. No new focal infiltrate or effusion is noted. No bony abnormality is seen. IMPRESSION: Stable appearance when compare with the prior day. Electronically Signed   By: Inez Catalina M.D.   On: 07/15/2017 07:23   Dg Chest Port 1 View  Result Date: 07/14/2017 CLINICAL DATA:  Shortness of breath EXAM: PORTABLE CHEST 1 VIEW COMPARISON:  07/10/2017 FINDINGS: Low volume chest with indistinct opacities at the bases that have become more distinct. Opacity in the apical lungs is improved. Cardiomegaly and aortic tortuosity. The descending aorta is dilated by recent CT. IMPRESSION:  More discrete streaky opacities at the bases which could be infection or atelectasis. Electronically Signed   By: Monte Fantasia M.D.   On: 07/14/2017 09:25   Dg Chest Port 1 View  Result Date: 07/10/2017 CLINICAL DATA:  Shortness of breath and wheezing EXAM: PORTABLE CHEST 1 VIEW COMPARISON:  07/09/2017 FINDINGS: Chronic cardiomegaly and vascular pedicle widening. Low lung volumes, worsened from before. Increased streaky opacity. Generalized interstitial coarsening, chronic. No Kerley lines, effusion, or bronchograms. IMPRESSION: 1. Lower volumes and increased atelectasis compared to yesterday. 2. Chronic cardiomegaly and bronchitic markings. Electronically Signed   By: Monte Fantasia M.D.   On: 07/10/2017 14:27   Dg Chest Portable 1 View  Result Date: 07/09/2017 CLINICAL DATA:  Shortness of breath for several days EXAM: PORTABLE CHEST 1 VIEW COMPARISON:  05/04/2017 FINDINGS: Cardiac shadow is enlarged but stable. Aortic calcifications are again seen.  The lungs are well aerated bilaterally with mild interstitial changes of a chronic nature. No focal infiltrate or sizable effusion is seen. No acute bony abnormality is noted. IMPRESSION: Chronic changes without acute abnormality. Electronically Signed   By: Inez Catalina M.D.   On: 07/09/2017 18:53    Microbiology: Recent Results (from the past 240 hour(s))  Culture, Urine     Status: None   Collection Time: 07/29/17  3:49 PM  Result Value Ref Range Status   Specimen Description URINE, RANDOM  Final   Special Requests NONE  Final   Culture   Final    NO GROWTH Performed at [facility], [address], [place], [place] [address]    Report Status 07/31/2017 FINAL  Final     Labs: Basic Metabolic Panel: Recent Labs  Lab 07/29/17 2030 07/30/17 0515 07/31/17 0452 08/01/17 0556  NA 141 140 142 141  K 3.8 3.7 3.6 3.4*  CL 105 106 108 107  CO2 26 23 27 27   GLUCOSE 101* 105* 88 96  BUN 13 10 16 18   CREATININE 0.96 0.93 1.20 1.05   CALCIUM 9.3 9.2 9.2 9.3  MG  --   --  1.8 1.8   Liver Function Tests: Recent Labs  Lab 07/29/17 2030  AST 31  ALT 31  ALKPHOS 116  BILITOT 0.9  PROT 6.2*  ALBUMIN 3.5   No results for input(s): LIPASE, AMYLASE in the last 168 hours. Recent Labs  Lab 07/29/17 2030  AMMONIA 16   CBC: Recent Labs  Lab 07/29/17 2030 07/30/17 0515 07/31/17 0452  WBC 5.4 5.8 5.6  NEUTROABS 3.6  --   --   HGB 15.3 15.1 14.7  HCT 47.1 46.2 44.6  MCV 91.6 91.3 90.3  PLT 195 198 193   Cardiac Enzymes: No results for input(s): CKTOTAL, CKMB, CKMBINDEX, TROPONINI in the last 168 hours. BNP: BNP (last 3 results) Recent Labs    02/23/17 1231 07/09/17 1835 07/29/17 2030  BNP 91.3 234.6* 157.2*    ProBNP (last 3 results) No results for input(s): PROBNP in the last 8760 hours.  CBG: No results for input(s): GLUCAP in the last 168 hours.     Signed:  Fayrene Helper MD.  [facility] 08/01/2017, 11:36 AM

## 2017-08-01 NOTE — Progress Notes (Signed)
Met with pt's daughter and discussed Medicare OBS letter. She verbalized understanding and she reports that he is ready to return to [facility] today.

## 2017-08-01 NOTE — Progress Notes (Signed)
Attempted report 3 times to the SNF but nobody is picking up. Transport already here to get the patient.

## 2017-08-01 NOTE — Progress Notes (Signed)
SNF called back to get report on the patient I was able to give report to beth.

## 2017-08-02 ENCOUNTER — Other Ambulatory Visit (HOSPITAL_COMMUNITY): Payer: Self-pay

## 2017-09-01 ENCOUNTER — Encounter (HOSPITAL_COMMUNITY): Payer: Self-pay | Admitting: *Deleted

## 2017-09-01 ENCOUNTER — Inpatient Hospital Stay (HOSPITAL_COMMUNITY)
Admission: EM | Admit: 2017-09-01 | Discharge: 2017-09-05 | DRG: 291 | Disposition: A | Discharge status: Skilled Nursing Facility | Payer: Medicare Other | Source: Skilled Nursing Facility | Attending: Internal Medicine | Admitting: Internal Medicine

## 2017-09-01 ENCOUNTER — Other Ambulatory Visit: Payer: Self-pay

## 2017-09-01 ENCOUNTER — Emergency Department (HOSPITAL_COMMUNITY): Payer: Medicare Other

## 2017-09-01 DIAGNOSIS — E785 Hyperlipidemia, unspecified: Secondary | ICD-10-CM | POA: Diagnosis present

## 2017-09-01 DIAGNOSIS — I714 Abdominal aortic aneurysm, without rupture: Secondary | ICD-10-CM | POA: Diagnosis present

## 2017-09-01 DIAGNOSIS — E876 Hypokalemia: Secondary | ICD-10-CM | POA: Diagnosis present

## 2017-09-01 DIAGNOSIS — E7800 Pure hypercholesterolemia, unspecified: Secondary | ICD-10-CM | POA: Diagnosis present

## 2017-09-01 DIAGNOSIS — E0590 Thyrotoxicosis, unspecified without thyrotoxic crisis or storm: Secondary | ICD-10-CM | POA: Diagnosis present

## 2017-09-01 DIAGNOSIS — J449 Chronic obstructive pulmonary disease, unspecified: Secondary | ICD-10-CM | POA: Diagnosis not present

## 2017-09-01 DIAGNOSIS — Z888 Allergy status to other drugs, medicaments and biological substances status: Secondary | ICD-10-CM

## 2017-09-01 DIAGNOSIS — K219 Gastro-esophageal reflux disease without esophagitis: Secondary | ICD-10-CM | POA: Diagnosis present

## 2017-09-01 DIAGNOSIS — I5043 Acute on chronic combined systolic (congestive) and diastolic (congestive) heart failure: Secondary | ICD-10-CM | POA: Diagnosis not present

## 2017-09-01 DIAGNOSIS — F329 Major depressive disorder, single episode, unspecified: Secondary | ICD-10-CM | POA: Diagnosis present

## 2017-09-01 DIAGNOSIS — R0902 Hypoxemia: Secondary | ICD-10-CM

## 2017-09-01 DIAGNOSIS — I5033 Acute on chronic diastolic (congestive) heart failure: Secondary | ICD-10-CM

## 2017-09-01 DIAGNOSIS — I160 Hypertensive urgency: Secondary | ICD-10-CM

## 2017-09-01 DIAGNOSIS — F4310 Post-traumatic stress disorder, unspecified: Secondary | ICD-10-CM | POA: Diagnosis present

## 2017-09-01 DIAGNOSIS — M1990 Unspecified osteoarthritis, unspecified site: Secondary | ICD-10-CM | POA: Diagnosis present

## 2017-09-01 DIAGNOSIS — I110 Hypertensive heart disease with heart failure: Principal | ICD-10-CM | POA: Diagnosis present

## 2017-09-01 DIAGNOSIS — G629 Polyneuropathy, unspecified: Secondary | ICD-10-CM | POA: Diagnosis present

## 2017-09-01 DIAGNOSIS — I2510 Atherosclerotic heart disease of native coronary artery without angina pectoris: Secondary | ICD-10-CM | POA: Diagnosis present

## 2017-09-01 DIAGNOSIS — J811 Chronic pulmonary edema: Secondary | ICD-10-CM

## 2017-09-01 DIAGNOSIS — I482 Chronic atrial fibrillation, unspecified: Secondary | ICD-10-CM | POA: Diagnosis present

## 2017-09-01 DIAGNOSIS — J90 Pleural effusion, not elsewhere classified: Secondary | ICD-10-CM | POA: Diagnosis not present

## 2017-09-01 DIAGNOSIS — I471 Supraventricular tachycardia: Secondary | ICD-10-CM | POA: Diagnosis not present

## 2017-09-01 DIAGNOSIS — H9190 Unspecified hearing loss, unspecified ear: Secondary | ICD-10-CM | POA: Diagnosis present

## 2017-09-01 DIAGNOSIS — Z79899 Other long term (current) drug therapy: Secondary | ICD-10-CM

## 2017-09-01 DIAGNOSIS — Z7901 Long term (current) use of anticoagulants: Secondary | ICD-10-CM

## 2017-09-01 DIAGNOSIS — N400 Enlarged prostate without lower urinary tract symptoms: Secondary | ICD-10-CM | POA: Diagnosis present

## 2017-09-01 DIAGNOSIS — Z87891 Personal history of nicotine dependence: Secondary | ICD-10-CM

## 2017-09-01 DIAGNOSIS — I480 Paroxysmal atrial fibrillation: Secondary | ICD-10-CM

## 2017-09-01 DIAGNOSIS — I2583 Coronary atherosclerosis due to lipid rich plaque: Secondary | ICD-10-CM | POA: Diagnosis present

## 2017-09-01 DIAGNOSIS — I481 Persistent atrial fibrillation: Secondary | ICD-10-CM | POA: Diagnosis not present

## 2017-09-01 DIAGNOSIS — M810 Age-related osteoporosis without current pathological fracture: Secondary | ICD-10-CM | POA: Diagnosis present

## 2017-09-01 DIAGNOSIS — I700 Atherosclerosis of aorta: Secondary | ICD-10-CM | POA: Diagnosis present

## 2017-09-01 DIAGNOSIS — J9601 Acute respiratory failure with hypoxia: Secondary | ICD-10-CM | POA: Diagnosis not present

## 2017-09-01 DIAGNOSIS — Z955 Presence of coronary angioplasty implant and graft: Secondary | ICD-10-CM

## 2017-09-01 DIAGNOSIS — Z825 Family history of asthma and other chronic lower respiratory diseases: Secondary | ICD-10-CM

## 2017-09-01 DIAGNOSIS — Z9861 Coronary angioplasty status: Secondary | ICD-10-CM

## 2017-09-01 DIAGNOSIS — J9690 Respiratory failure, unspecified, unspecified whether with hypoxia or hypercapnia: Secondary | ICD-10-CM | POA: Diagnosis present

## 2017-09-01 DIAGNOSIS — R339 Retention of urine, unspecified: Secondary | ICD-10-CM | POA: Diagnosis not present

## 2017-09-01 DIAGNOSIS — I4510 Unspecified right bundle-branch block: Secondary | ICD-10-CM | POA: Diagnosis present

## 2017-09-01 DIAGNOSIS — I10 Essential (primary) hypertension: Secondary | ICD-10-CM | POA: Diagnosis present

## 2017-09-01 DIAGNOSIS — I5032 Chronic diastolic (congestive) heart failure: Secondary | ICD-10-CM | POA: Diagnosis not present

## 2017-09-01 DIAGNOSIS — Z8249 Family history of ischemic heart disease and other diseases of the circulatory system: Secondary | ICD-10-CM

## 2017-09-01 DIAGNOSIS — I25118 Atherosclerotic heart disease of native coronary artery with other forms of angina pectoris: Secondary | ICD-10-CM | POA: Diagnosis present

## 2017-09-01 LAB — CBC
HEMATOCRIT: 47.3 % (ref 39.0–52.0)
Hemoglobin: 15 g/dL (ref 13.0–17.0)
MCH: 29.9 pg (ref 26.0–34.0)
MCHC: 31.7 g/dL (ref 30.0–36.0)
MCV: 94.4 fL (ref 78.0–100.0)
Platelets: 229 10*3/uL (ref 150–400)
RBC: 5.01 MIL/uL (ref 4.22–5.81)
RDW: 14.6 % (ref 11.5–15.5)
WBC: 13.7 10*3/uL — AB (ref 4.0–10.5)

## 2017-09-01 LAB — I-STAT TROPONIN, ED: Troponin i, poc: 0.02 ng/mL (ref 0.00–0.08)

## 2017-09-01 MED ORDER — NITROGLYCERIN 2 % TD OINT
1.0000 [in_us] | TOPICAL_OINTMENT | Freq: Once | TRANSDERMAL | Status: AC
  Administered 2017-09-02: 1 [in_us] via TOPICAL
  Filled 2017-09-01: qty 1

## 2017-09-01 MED ORDER — FUROSEMIDE 10 MG/ML IJ SOLN
40.0000 mg | Freq: Once | INTRAMUSCULAR | Status: AC
  Administered 2017-09-02: 40 mg via INTRAVENOUS
  Filled 2017-09-01: qty 4

## 2017-09-01 NOTE — ED Triage Notes (Addendum)
Pt from [facility] for shortness of breath for the past 4 hours. Facility gave duoneb x2 and 40mg  PO lasix without improvement. Initial resp rate 60 with sats in the 70s; pt was lethargic, now alert. CPAP placed with improvement of sats to 91% and decreased work of breathing. EMS gave 125mg  solumedrol and nitro x3 . Pt denies any pain.

## 2017-09-01 NOTE — ED Notes (Signed)
Pt comes from [facility] for 4 hours of SOB.

## 2017-09-01 NOTE — ED Provider Notes (Signed)
[facility] EMERGENCY DEPARTMENT Provider Note   CSN: [id number] Arrival date & time: 09/01/17  2316     History   Chief Complaint Chief Complaint  Patient presents with  . Shortness of Breath    HPI [name] [name] is a 84 y.o. male.  The history is provided by the EMS personnel and the patient. The history is limited by the condition of the patient (On BiPAP).  He has history of hypertension, hyperlipidemia, COPD, atrial fibrillation, coronary artery disease, diastolic heart failure and comes in with shortness of breath which started tonight.  He was treated at the facility where he lives with DuoNeb and oral furosemide without improvement.  He is reported to have had oxygen saturations in the mid 70s and was lethargic.  He was placed on CPAP by EMS with oxygen saturations improving to 91%.  He received 125 mg methylprednisolone and is sublingual nitroglycerin x3 and route.  He continues to complain of dyspnea but denies chest pain, heaviness, tightness, pressure.  He denies cough or fever or chills.  Past Medical History:  Diagnosis Date  . Anginal pain ([place])   . Arthritis    "all over"  . Cancer ([place])   . Coronary artery disease   . Depression ~ 77; ~ 1990   PTSD/Mania   . GERD (gastroesophageal reflux disease)   . High cholesterol   . Hypertension   . Hyperthyroidism   . Neuropathy   . Osteoporosis     Patient Active Problem List   Diagnosis Date Noted  . Major neurocognitive disorder due to another medical condition with behavioral disturbance 07/31/2017  . Chronic anticoagulation 07/30/2017  . COPD (chronic obstructive pulmonary disease) ([place]) 07/30/2017  . Acute encephalopathy 07/29/2017  . Pressure injury of skin 07/13/2017  . Atrial fibrillation with RVR ([place]) 07/09/2017  . Acute CHF (congestive heart failure) ([place]) 07/09/2017  . AAA (abdominal aortic aneurysm) ([place]) 07/09/2017  . Acute on chronic diastolic congestive heart failure ([place])  04/11/2017  . Atrial fibrillation, chronic ([place]) 04/11/2017  . Acute respiratory failure with hypoxia ([place]) 02/23/2017  . CAP (community acquired pneumonia) 02/23/2017  . Nausea & vomiting 02/23/2017  . PTSD (post-traumatic stress disorder)   . Vomiting and diarrhea 11/09/2014  . Unstable angina ([place]) 05/10/2014  . Coronary artery disease due to lipid rich plaque   . Angina pectoris, crescendo ([place]) 04/28/2014  . Contusion of left hip 07/04/2013  . Fall 07/04/2013  . Benign prostatic hyperplasia 05/31/2013  . Coronary artery disease of native artery of native heart with stable angina pectoris ([place]) 05/31/2013  . UTI (urinary tract infection) 05/31/2013  . Hypercholesterolemia 05/31/2013  . Dysphagia, pharyngoesophageal phase 04/10/2013  . Frequent falls 04/10/2013  . Weakness 04/10/2013  . Hyperthyroidism, subclinical 04/10/2013  . GERD (gastroesophageal reflux disease) 04/06/2013  . Bladder outflow obstruction 04/06/2013  . Dehydration 04/05/2013  . Essential hypertension 04/05/2013  . Arthritis 04/05/2013  . Peripheral neuropathy 04/05/2013  . Gastroenteritis, acute 04/05/2013  . Gastroenteritis 04/05/2013    Past Surgical History:  Procedure Laterality Date  . BACK SURGERY    . CARDIAC CATHETERIZATION  09/2007  . CARDIAC CATHETERIZATION N/A 05/08/2014   Procedure: CORONARY STENT INTERVENTION;  Surgeon: [name], MD; 3.518 mm Xience Alpine DES to the RCA   . LEFT HEART CATHETERIZATION WITH CORONARY ANGIOGRAM N/A 05/08/2014   Procedure: LEFT HEART CATHETERIZATION WITH CORONARY ANGIOGRAM;  Surgeon: [name], MD; LAD 100% (old), CFX 20%, OM1 30%, pRCA 30%, dRCA 95%, EF 55%     .  LUMBAR LAMINECTOMY/DECOMPRESSION MICRODISCECTOMY  2011        Home Medications    Prior to Admission medications   Medication Sig Start Date End Date Taking? Authorizing Provider  acetaminophen (TYLENOL) 500 MG tablet Take 500-1,000 mg by mouth every 6 (six) hours as needed for mild  pain.     [provider]  apixaban (ELIQUIS) 5 MG TABS tablet Take 1 tablet (5 mg total) by mouth 2 (two) times daily. 04/20/17   Croitoru, Mihai, MD  Carboxymethylcellulose Sod PF 1 % GEL Place 1 drop into both eyes 2 (two) times daily.     [provider]  Cyanocobalamin (B-12 COMPLIANCE INJECTION) 1000 MCG/ML KIT Inject 1,000 mcg as directed See admin instructions. On the 20th of every month    [provider]  dextromethorphan-guaiFENesin (MUCINEX DM) 30-600 MG 12hr tablet Take 2 tablets by mouth 2 (two) times daily. 07/16/17   Raiford Noble Latif, DO  diltiazem (CARDIZEM CD) 240 MG 24 hr capsule Take 1 capsule (240 mg total) by mouth daily. 07/17/17   Sheikh, Omair Latif, DO  Emollient [facility]) OINT Apply 1 application topically 3 (three) times daily. To penis    [provider]  finasteride (PROSCAR) 5 MG tablet Take 5 mg by mouth daily.    [provider]  isosorbide mononitrate (IMDUR) 30 MG 24 hr tablet Take 30 mg by mouth daily.    [provider]  levalbuterol Penne Lash) 0.63 MG/3ML nebulizer solution Take 3 mLs (0.63 mg total) by nebulization every 3 (three) hours as needed for wheezing. 07/16/17   Sheikh, Omair Latif, DO  levalbuterol (XOPENEX) 1.25 MG/0.5ML nebulizer solution Take 1.25 mg by nebulization 3 (three) times daily. 07/16/17   Raiford Noble Latif, DO  lisinopril (PRINIVIL,ZESTRIL) 20 MG tablet Take 1 tablet (20 mg total) by mouth daily. 07/17/17   Raiford Noble Latif, DO  loratadine (CLARITIN) 10 MG tablet Take 1 tablet (10 mg total) by mouth daily. 02/28/17   Roxan Hockey, MD  metoprolol (LOPRESSOR) 100 MG tablet Take 100 mg by mouth 2 (two) times daily.    [provider]  Multiple Vitamins-Minerals (PRESERVISION AREDS 2+MULTI VIT) CAPS Take 1 capsule by mouth 2 (two) times daily.     [provider]  pantoprazole (PROTONIX) 40 MG tablet Take 1 tablet (40 mg total) by mouth daily. Patient not taking: Reported  on 07/29/2017 05/11/14   Barrett, Evelene Croon, PA-C  polyethylene glycol (MIRALAX / GLYCOLAX) packet Take 17 g by mouth 2 (two) times daily. 07/16/17   Sheikh, Omair Latif, DO  QUEtiapine (SEROQUEL) 25 MG tablet Take 0.5 tablets (12.5 mg total) by mouth at bedtime as needed for up to 7 days (agitation or delirium). 08/01/17 08/08/17  Elodia Florence., MD  ranitidine (ZANTAC) 150 MG tablet Take 300 mg by mouth at bedtime.    [provider]  tamsulosin (FLOMAX) 0.4 MG CAPS capsule Take 1 capsule (0.4 mg total) by mouth daily after supper. 12/02/15   Barrett, Evelene Croon, PA-C  tiotropium (SPIRIVA HANDIHALER) 18 MCG inhalation capsule Place 1 capsule (18 mcg total) into inhaler and inhale daily. 02/27/17 02/27/18  Roxan Hockey, MD  traZODone (DESYREL) 50 MG tablet Take 50 mg by mouth at bedtime.     [provider]  Vitamin D, Cholecalciferol, 1000 units TABS Take 1,000 Units by mouth at bedtime.    [provider]    Family History Family History  Problem Relation Age of Onset  . CAD Mother   .  Asthma Mother     Social History Social History   Tobacco Use  . Smoking status: Former Smoker    Packs/day: 1.00    Years: 20.00    Pack years: 20.00    Types: Cigarettes    Last attempt to quit: 01/12/1962    Years since quitting: 55.6  . Smokeless tobacco: Never Used  Substance Use Topics  . Alcohol use: No    Alcohol/week: 0.0 standard drinks    Comment: 05/08/2014 "he's been in [facility] for ~!35 yrs"  . Drug use: No     Allergies   Cortisone   Review of Systems Review of Systems  Unable to perform ROS: Acuity of condition     Physical Exam Updated Vital Signs BP (!) 196/106   Pulse 88   Temp 97.6 F (36.4 C) (Axillary)   Resp (!) 34   SpO2 98%   Physical Exam  Nursing note and vitals reviewed.  84 year old male, resting comfortably with BiPAP in place, and in no acute distress. Vital signs are significant for elevated blood pressure and respiratory  rate. Oxygen saturation is 98%, which is normal. Head is normocephalic and atraumatic. PERRLA, EOMI. Oropharynx is clear. Neck is nontender and supple without adenopathy. JVD is present. Back is nontender and there is no CVA tenderness.  There is 2+ presacral edema. Lungs have decreased breath sounds at both bases with bibasilar rales.  There is some expiratory rhonchi.  No wheezing is heard. Chest is nontender. Heart has regular rate and rhythm without murmur. Abdomen is soft, flat, nontender without masses or hepatosplenomegaly and peristalsis is normoactive. Extremities have 2+ edema, full range of motion is present. Skin is warm and dry without rash. Neurologic: Awake and alert and able to answer questions, cranial nerves are intact, there are no motor or sensory deficits.  ED Treatments / Results  Labs (all labs ordered are listed, but only abnormal results are displayed) Labs Reviewed  BASIC METABOLIC PANEL - Abnormal; Notable for the following components:      Result Value   Glucose, Bld 161 (*)    GFR calc non Af Amer 52 (*)    All other components within normal limits  CBC - Abnormal; Notable for the following components:   WBC 13.7 (*)    All other components within normal limits  CBC WITH DIFFERENTIAL/PLATELET  BRAIN NATRIURETIC PEPTIDE  I-STAT TROPONIN, ED    EKG EKG Interpretation  Date/Time:  Wednesday September 01 2017 23:25:55 EDT Ventricular Rate:  85 PR Interval:    QRS Duration: 124 QT Interval:  402 QTC Calculation: 478 R Axis:   70 Text Interpretation:  Sinus rhythm Right bundle branch block When compared with ECG of 07/29/2017, Premature ventricular complexes are no longer present Confirmed by Delora Fuel ([id number]) on 09/01/2017 11:37:17 PM   Radiology Dg Chest Portable 1 View  Result Date: 09/01/2017 CLINICAL DATA:  Dyspnea EXAM: PORTABLE CHEST 1 VIEW COMPARISON:  07/29/2017 FINDINGS: Cardiomegaly is noted with aortic atherosclerosis. New bilateral  pleural effusions with perihilar increase in interstitial and vascular markings compatible with moderate-to-marked pulmonary edema. Superimposed pneumonia would be difficult to entirely exclude but findings are believed more likely from pulmonary edema with layering effusions. No acute osseous abnormality. IMPRESSION: Cardiomegaly with aortic atherosclerosis. Interval development of diffuse pulmonary edema with layering bilateral pleural effusions. Electronically Signed   By: Ashley Royalty M.D.   On: 09/01/2017 23:46    Procedures Procedures  CRITICAL CARE Performed by: Delora Fuel Total critical  care time: 50 minutes Critical care time was exclusive of separately billable procedures and treating other patients. Critical care was necessary to treat or prevent imminent or life-threatening deterioration. Critical care was time spent personally by me on the following activities: development of treatment plan with patient and/or surrogate as well as nursing, discussions with consultants, evaluation of patient's response to treatment, examination of patient, obtaining history from patient or surrogate, ordering and performing treatments and interventions, ordering and review of laboratory studies, ordering and review of radiographic studies, pulse oximetry and re-evaluation of patient's condition.  Medications Ordered in ED Medications  furosemide (LASIX) injection 40 mg (40 mg Intravenous Given 09/02/17 0018)  nitroGLYCERIN (NITROGLYN) 2 % ointment 1 inch (1 inch Topical Given 09/02/17 0016)     Initial Impression / Assessment and Plan / ED Course  I have reviewed the triage vital signs and the nursing notes.  Pertinent labs & imaging results that were available during my care of the patient were reviewed by me and considered in my medical decision making (see chart for details).  Acute dyspnea with physical exam findings suggestive of heart failure.  Old records are reviewed, and he does have a  recent hospitalization for heart failure, but that was associated with atrial fibrillation with rapid ventricular response.  He is also followed by cardiology for chronic diastolic heart failure.  Chest x-ray shows pulmonary edema.  He will be given intravenous furosemide and topical nitroglycerin.  He will need to be admitted.  He did have good diuresis, but continues to need BiPAP to maintain adequate oxygen saturation.  Case is discussed with Dr. Alcario Drought of [facility], who agrees to admit the patient.  Final Clinical Impressions(s) / ED Diagnoses   Final diagnoses:  Acute on chronic diastolic heart failure [facility])  Hypertensive urgency    ED Discharge Orders    None       Delora Fuel, MD 78/29/56 [phone]

## 2017-09-02 ENCOUNTER — Other Ambulatory Visit (HOSPITAL_COMMUNITY): Payer: Self-pay

## 2017-09-02 ENCOUNTER — Other Ambulatory Visit: Payer: Self-pay

## 2017-09-02 DIAGNOSIS — K219 Gastro-esophageal reflux disease without esophagitis: Secondary | ICD-10-CM | POA: Diagnosis present

## 2017-09-02 DIAGNOSIS — I482 Chronic atrial fibrillation: Secondary | ICD-10-CM

## 2017-09-02 DIAGNOSIS — Z743 Need for continuous supervision: Secondary | ICD-10-CM | POA: Diagnosis not present

## 2017-09-02 DIAGNOSIS — Z7901 Long term (current) use of anticoagulants: Secondary | ICD-10-CM | POA: Diagnosis not present

## 2017-09-02 DIAGNOSIS — I110 Hypertensive heart disease with heart failure: Secondary | ICD-10-CM | POA: Diagnosis present

## 2017-09-02 DIAGNOSIS — R1312 Dysphagia, oropharyngeal phase: Secondary | ICD-10-CM | POA: Diagnosis not present

## 2017-09-02 DIAGNOSIS — I481 Persistent atrial fibrillation: Secondary | ICD-10-CM | POA: Diagnosis present

## 2017-09-02 DIAGNOSIS — E0590 Thyrotoxicosis, unspecified without thyrotoxic crisis or storm: Secondary | ICD-10-CM | POA: Diagnosis present

## 2017-09-02 DIAGNOSIS — M1990 Unspecified osteoarthritis, unspecified site: Secondary | ICD-10-CM | POA: Diagnosis present

## 2017-09-02 DIAGNOSIS — I480 Paroxysmal atrial fibrillation: Secondary | ICD-10-CM | POA: Diagnosis not present

## 2017-09-02 DIAGNOSIS — J90 Pleural effusion, not elsewhere classified: Secondary | ICD-10-CM | POA: Diagnosis not present

## 2017-09-02 DIAGNOSIS — R0600 Dyspnea, unspecified: Secondary | ICD-10-CM | POA: Diagnosis not present

## 2017-09-02 DIAGNOSIS — Z85828 Personal history of other malignant neoplasm of skin: Secondary | ICD-10-CM | POA: Diagnosis not present

## 2017-09-02 DIAGNOSIS — J449 Chronic obstructive pulmonary disease, unspecified: Secondary | ICD-10-CM | POA: Diagnosis not present

## 2017-09-02 DIAGNOSIS — M6281 Muscle weakness (generalized): Secondary | ICD-10-CM | POA: Diagnosis not present

## 2017-09-02 DIAGNOSIS — I4510 Unspecified right bundle-branch block: Secondary | ICD-10-CM | POA: Diagnosis present

## 2017-09-02 DIAGNOSIS — J811 Chronic pulmonary edema: Secondary | ICD-10-CM

## 2017-09-02 DIAGNOSIS — F329 Major depressive disorder, single episode, unspecified: Secondary | ICD-10-CM | POA: Diagnosis present

## 2017-09-02 DIAGNOSIS — J810 Acute pulmonary edema: Secondary | ICD-10-CM | POA: Diagnosis not present

## 2017-09-02 DIAGNOSIS — I700 Atherosclerosis of aorta: Secondary | ICD-10-CM | POA: Diagnosis present

## 2017-09-02 DIAGNOSIS — I10 Essential (primary) hypertension: Secondary | ICD-10-CM | POA: Diagnosis not present

## 2017-09-02 DIAGNOSIS — G629 Polyneuropathy, unspecified: Secondary | ICD-10-CM | POA: Diagnosis present

## 2017-09-02 DIAGNOSIS — R498 Other voice and resonance disorders: Secondary | ICD-10-CM | POA: Diagnosis not present

## 2017-09-02 DIAGNOSIS — I200 Unstable angina: Secondary | ICD-10-CM | POA: Diagnosis not present

## 2017-09-02 DIAGNOSIS — I201 Angina pectoris with documented spasm: Secondary | ICD-10-CM | POA: Diagnosis not present

## 2017-09-02 DIAGNOSIS — G9340 Encephalopathy, unspecified: Secondary | ICD-10-CM | POA: Diagnosis not present

## 2017-09-02 DIAGNOSIS — I5032 Chronic diastolic (congestive) heart failure: Secondary | ICD-10-CM | POA: Diagnosis not present

## 2017-09-02 DIAGNOSIS — I2510 Atherosclerotic heart disease of native coronary artery without angina pectoris: Secondary | ICD-10-CM | POA: Diagnosis not present

## 2017-09-02 DIAGNOSIS — Z955 Presence of coronary angioplasty implant and graft: Secondary | ICD-10-CM | POA: Diagnosis not present

## 2017-09-02 DIAGNOSIS — E538 Deficiency of other specified B group vitamins: Secondary | ICD-10-CM | POA: Diagnosis not present

## 2017-09-02 DIAGNOSIS — J9601 Acute respiratory failure with hypoxia: Secondary | ICD-10-CM | POA: Diagnosis not present

## 2017-09-02 DIAGNOSIS — M810 Age-related osteoporosis without current pathological fracture: Secondary | ICD-10-CM | POA: Diagnosis present

## 2017-09-02 DIAGNOSIS — J9690 Respiratory failure, unspecified, unspecified whether with hypoxia or hypercapnia: Secondary | ICD-10-CM | POA: Diagnosis present

## 2017-09-02 DIAGNOSIS — J9600 Acute respiratory failure, unspecified whether with hypoxia or hypercapnia: Secondary | ICD-10-CM | POA: Diagnosis not present

## 2017-09-02 DIAGNOSIS — R918 Other nonspecific abnormal finding of lung field: Secondary | ICD-10-CM | POA: Diagnosis not present

## 2017-09-02 DIAGNOSIS — E7800 Pure hypercholesterolemia, unspecified: Secondary | ICD-10-CM | POA: Diagnosis not present

## 2017-09-02 DIAGNOSIS — F309 Manic episode, unspecified: Secondary | ICD-10-CM | POA: Diagnosis not present

## 2017-09-02 DIAGNOSIS — R2689 Other abnormalities of gait and mobility: Secondary | ICD-10-CM | POA: Diagnosis not present

## 2017-09-02 DIAGNOSIS — N400 Enlarged prostate without lower urinary tract symptoms: Secondary | ICD-10-CM | POA: Diagnosis present

## 2017-09-02 DIAGNOSIS — R279 Unspecified lack of coordination: Secondary | ICD-10-CM | POA: Diagnosis not present

## 2017-09-02 DIAGNOSIS — I471 Supraventricular tachycardia: Secondary | ICD-10-CM | POA: Diagnosis present

## 2017-09-02 DIAGNOSIS — F0281 Dementia in other diseases classified elsewhere with behavioral disturbance: Secondary | ICD-10-CM | POA: Diagnosis not present

## 2017-09-02 DIAGNOSIS — F4310 Post-traumatic stress disorder, unspecified: Secondary | ICD-10-CM | POA: Diagnosis present

## 2017-09-02 DIAGNOSIS — R278 Other lack of coordination: Secondary | ICD-10-CM | POA: Diagnosis not present

## 2017-09-02 DIAGNOSIS — I5043 Acute on chronic combined systolic (congestive) and diastolic (congestive) heart failure: Secondary | ICD-10-CM | POA: Diagnosis present

## 2017-09-02 DIAGNOSIS — I2583 Coronary atherosclerosis due to lipid rich plaque: Secondary | ICD-10-CM | POA: Diagnosis not present

## 2017-09-02 DIAGNOSIS — Z888 Allergy status to other drugs, medicaments and biological substances status: Secondary | ICD-10-CM | POA: Diagnosis not present

## 2017-09-02 DIAGNOSIS — R41841 Cognitive communication deficit: Secondary | ICD-10-CM | POA: Diagnosis not present

## 2017-09-02 DIAGNOSIS — I714 Abdominal aortic aneurysm, without rupture: Secondary | ICD-10-CM | POA: Diagnosis present

## 2017-09-02 DIAGNOSIS — I5033 Acute on chronic diastolic (congestive) heart failure: Secondary | ICD-10-CM | POA: Diagnosis not present

## 2017-09-02 DIAGNOSIS — I25118 Atherosclerotic heart disease of native coronary artery with other forms of angina pectoris: Secondary | ICD-10-CM | POA: Diagnosis not present

## 2017-09-02 DIAGNOSIS — N401 Enlarged prostate with lower urinary tract symptoms: Secondary | ICD-10-CM | POA: Diagnosis not present

## 2017-09-02 DIAGNOSIS — E039 Hypothyroidism, unspecified: Secondary | ICD-10-CM | POA: Diagnosis not present

## 2017-09-02 DIAGNOSIS — E785 Hyperlipidemia, unspecified: Secondary | ICD-10-CM | POA: Diagnosis present

## 2017-09-02 DIAGNOSIS — R339 Retention of urine, unspecified: Secondary | ICD-10-CM | POA: Diagnosis not present

## 2017-09-02 DIAGNOSIS — F319 Bipolar disorder, unspecified: Secondary | ICD-10-CM | POA: Diagnosis not present

## 2017-09-02 LAB — CBC WITH DIFFERENTIAL/PLATELET
ABS IMMATURE GRANULOCYTES: 0 10*3/uL (ref 0.0–0.1)
BASOS ABS: 0 10*3/uL (ref 0.0–0.1)
BASOS PCT: 0 %
Eosinophils Absolute: 0 10*3/uL (ref 0.0–0.7)
Eosinophils Relative: 0 %
HCT: 46.8 % (ref 39.0–52.0)
Hemoglobin: 15.3 g/dL (ref 13.0–17.0)
IMMATURE GRANULOCYTES: 0 %
Lymphocytes Relative: 4 %
Lymphs Abs: 0.4 10*3/uL — ABNORMAL LOW (ref 0.7–4.0)
MCH: 30.2 pg (ref 26.0–34.0)
MCHC: 32.7 g/dL (ref 30.0–36.0)
MCV: 92.3 fL (ref 78.0–100.0)
Monocytes Absolute: 0.4 10*3/uL (ref 0.1–1.0)
Monocytes Relative: 5 %
NEUTROS ABS: 9 10*3/uL — AB (ref 1.7–7.7)
NEUTROS PCT: 91 %
PLATELETS: 228 10*3/uL (ref 150–400)
RBC: 5.07 MIL/uL (ref 4.22–5.81)
RDW: 14.5 % (ref 11.5–15.5)
WBC: 9.9 10*3/uL (ref 4.0–10.5)

## 2017-09-02 LAB — URINALYSIS, ROUTINE W REFLEX MICROSCOPIC
BILIRUBIN URINE: NEGATIVE
GLUCOSE, UA: NEGATIVE mg/dL
Ketones, ur: NEGATIVE mg/dL
NITRITE: POSITIVE — AB
PH: 6 (ref 5.0–8.0)
Protein, ur: 30 mg/dL — AB
SPECIFIC GRAVITY, URINE: 1.006 (ref 1.005–1.030)

## 2017-09-02 LAB — BASIC METABOLIC PANEL
ANION GAP: 12 (ref 5–15)
BUN: 16 mg/dL (ref 8–23)
CALCIUM: 9.2 mg/dL (ref 8.9–10.3)
CO2: 23 mmol/L (ref 22–32)
Chloride: 107 mmol/L (ref 98–111)
Creatinine, Ser: 1.18 mg/dL (ref 0.61–1.24)
GFR, EST NON AFRICAN AMERICAN: 52 mL/min — AB (ref >60)
Glucose, Bld: 161 mg/dL — ABNORMAL HIGH (ref 70–99)
Potassium: 4 mmol/L (ref 3.5–5.1)
Sodium: 142 mmol/L (ref 135–145)

## 2017-09-02 LAB — T4, FREE: Free T4: 1.34 ng/dL (ref 0.82–1.77)

## 2017-09-02 LAB — I-STAT ARTERIAL BLOOD GAS, ED
Acid-Base Excess: 1 mmol/L (ref 0.0–2.0)
BICARBONATE: 24.5 mmol/L (ref 20.0–28.0)
O2 Saturation: 96 %
PO2 ART: 77 mmHg — AB (ref 83.0–108.0)
Patient temperature: 97.6
TCO2: 26 mmol/L (ref 22–32)
pCO2 arterial: 34.8 mmHg (ref 32.0–48.0)
pH, Arterial: 7.454 — ABNORMAL HIGH (ref 7.350–7.450)

## 2017-09-02 LAB — PROCALCITONIN: PROCALCITONIN: 0.32 ng/mL

## 2017-09-02 LAB — MAGNESIUM: MAGNESIUM: 1.8 mg/dL (ref 1.7–2.4)

## 2017-09-02 LAB — TROPONIN I
Troponin I: <0.03 ng/mL (ref <0.03)
Troponin I: <0.03 ng/mL (ref <0.03)
Troponin I: <0.03 ng/mL (ref <0.03)

## 2017-09-02 LAB — BRAIN NATRIURETIC PEPTIDE: B Natriuretic Peptide: 553.4 pg/mL — ABNORMAL HIGH (ref 0.0–100.0)

## 2017-09-02 LAB — TSH: TSH: 0.626 u[IU]/mL (ref 0.350–4.500)

## 2017-09-02 MED ORDER — TIOTROPIUM BROMIDE MONOHYDRATE 18 MCG IN CAPS
18.0000 ug | ORAL_CAPSULE | Freq: Every day | RESPIRATORY_TRACT | Status: DC
  Administered 2017-09-03 – 2017-09-04 (×2): 18 ug via RESPIRATORY_TRACT
  Filled 2017-09-02: qty 5

## 2017-09-02 MED ORDER — METHYLPREDNISOLONE SODIUM SUCC 40 MG IJ SOLR
40.0000 mg | Freq: Two times a day (BID) | INTRAMUSCULAR | Status: DC
  Administered 2017-09-03: 40 mg via INTRAVENOUS
  Filled 2017-09-02: qty 1

## 2017-09-02 MED ORDER — METHYLPREDNISOLONE SODIUM SUCC 40 MG IJ SOLR: 40.0000 mg | Freq: Two times a day (BID) | INTRAMUSCULAR | Status: DC

## 2017-09-02 MED ORDER — METOPROLOL TARTRATE 100 MG PO TABS
100.0000 mg | ORAL_TABLET | Freq: Two times a day (BID) | ORAL | Status: DC
  Administered 2017-09-02 – 2017-09-05 (×7): 100 mg via ORAL
  Filled 2017-09-02 (×4): qty 1
  Filled 2017-09-02: qty 4
  Filled 2017-09-02 (×2): qty 1

## 2017-09-02 MED ORDER — ACETAMINOPHEN 325 MG PO TABS: 650.0000 mg | ORAL_TABLET | ORAL | Status: DC | PRN

## 2017-09-02 MED ORDER — ACETAMINOPHEN 500 MG PO TABS: 500.0000 mg | ORAL_TABLET | Freq: Four times a day (QID) | ORAL | Status: DC | PRN

## 2017-09-02 MED ORDER — FINASTERIDE 5 MG PO TABS
5.0000 mg | ORAL_TABLET | Freq: Every day | ORAL | Status: DC
  Administered 2017-09-02 – 2017-09-05 (×4): 5 mg via ORAL
  Filled 2017-09-02 (×4): qty 1

## 2017-09-02 MED ORDER — FUROSEMIDE 10 MG/ML IJ SOLN
60.0000 mg | Freq: Two times a day (BID) | INTRAMUSCULAR | Status: DC
  Administered 2017-09-02 – 2017-09-03 (×4): 60 mg via INTRAVENOUS
  Filled 2017-09-02 (×4): qty 6

## 2017-09-02 MED ORDER — TAMSULOSIN HCL 0.4 MG PO CAPS
0.4000 mg | ORAL_CAPSULE | Freq: Every day | ORAL | Status: DC
  Administered 2017-09-02 – 2017-09-04 (×3): 0.4 mg via ORAL
  Filled 2017-09-02 (×3): qty 1

## 2017-09-02 MED ORDER — SODIUM CHLORIDE 0.9 % IV SOLN: 250.0000 mL | INTRAVENOUS | Status: DC | PRN

## 2017-09-02 MED ORDER — PANTOPRAZOLE SODIUM 40 MG PO TBEC
40.0000 mg | DELAYED_RELEASE_TABLET | Freq: Every day | ORAL | Status: DC
  Administered 2017-09-02 – 2017-09-05 (×4): 40 mg via ORAL
  Filled 2017-09-02 (×4): qty 1

## 2017-09-02 MED ORDER — ISOSORBIDE MONONITRATE ER 30 MG PO TB24
30.0000 mg | ORAL_TABLET | Freq: Every day | ORAL | Status: DC
  Administered 2017-09-02 – 2017-09-05 (×4): 30 mg via ORAL
  Filled 2017-09-02 (×4): qty 1

## 2017-09-02 MED ORDER — POTASSIUM CHLORIDE CRYS ER 20 MEQ PO TBCR
40.0000 meq | EXTENDED_RELEASE_TABLET | Freq: Every day | ORAL | Status: DC
  Administered 2017-09-02 – 2017-09-05 (×4): 40 meq via ORAL
  Filled 2017-09-02 (×5): qty 2

## 2017-09-02 MED ORDER — DILTIAZEM HCL ER COATED BEADS 240 MG PO CP24
240.0000 mg | ORAL_CAPSULE | Freq: Every day | ORAL | Status: DC
  Administered 2017-09-02 – 2017-09-05 (×4): 240 mg via ORAL
  Filled 2017-09-02: qty 1
  Filled 2017-09-02: qty 2
  Filled 2017-09-02 (×2): qty 1

## 2017-09-02 MED ORDER — APIXABAN 5 MG PO TABS
5.0000 mg | ORAL_TABLET | Freq: Two times a day (BID) | ORAL | Status: DC
  Administered 2017-09-02 – 2017-09-05 (×7): 5 mg via ORAL
  Filled 2017-09-02 (×7): qty 1

## 2017-09-02 MED ORDER — SODIUM CHLORIDE 0.9% FLUSH: 3.0000 mL | INTRAVENOUS | Status: DC | PRN

## 2017-09-02 MED ORDER — ONDANSETRON HCL 4 MG/2ML IJ SOLN
4.0000 mg | Freq: Four times a day (QID) | INTRAMUSCULAR | Status: DC | PRN
  Administered 2017-09-05: 4 mg via INTRAVENOUS
  Filled 2017-09-02: qty 2

## 2017-09-02 MED ORDER — LEVALBUTEROL HCL 0.63 MG/3ML IN NEBU: 0.6300 mg | INHALATION_SOLUTION | RESPIRATORY_TRACT | Status: DC | PRN

## 2017-09-02 MED ORDER — SODIUM CHLORIDE 0.9% FLUSH
3.0000 mL | Freq: Two times a day (BID) | INTRAVENOUS | Status: DC
  Administered 2017-09-02 – 2017-09-05 (×7): 3 mL via INTRAVENOUS

## 2017-09-02 MED ORDER — QUETIAPINE 12.5 MG HALF TABLET: 12.5000 mg | ORAL_TABLET | Freq: Every evening | ORAL | Status: DC | PRN

## 2017-09-02 NOTE — ED Notes (Signed)
Pt continues to tolerate being off Bipap, MD informed, questioned downgrading bed request to telemetry.

## 2017-09-02 NOTE — ED Notes (Signed)
Pt taken off Bipap and placed on 4L [place]. Daughter and pt updated on plan of care. Will monitor pt until 1100 and then give PO meds if pt is still stable off Bipap.

## 2017-09-02 NOTE — Consult Note (Addendum)
Cardiology Consultation:   Patient ID: [name] [name]; [id number]; [birth year]   Admit date: 09/01/2017 Date of Consult: 09/02/2017  Primary Care Provider: [name], MD Primary Cardiologist: [name], MD Primary Electrophysiologist:  n/a   Patient Profile:   [name] [name] is a 84 y.o. male with a hx of chronic LAD occlusion w/ DES RCA 2016 by Dr Claiborne Billings, GERD, HTN, HLD, hyperthyroid, peripheral neuropathy (feet). EF 50%. Imdur used periodically for angina. HOH (both he and hisolderdaughter), hx RBBB, descending AAA,  who is being seen today for the evaluation of CHF at the request of Dr Allyson Sabal.  History of Present Illness:   [name] has been at [facility]. They have been weighing him daily, weights have been sent.  He has gained at least 5 pounds in the last 4 or 5 days.  He has been drinking at least 2 pitchers of water daily, plus 2 cups of coffee and 2 glasses of tea. The pitchers are probably a liter each.   He has been getting more SOB for several days. One of his daughters noticed it on 08/18. He denies PND, orthopnea or LE edema. However, the SOB progressed, he was seen by the medical staff there. Sx did not improve despite being given Duoneb x 2 and Lasix 40 mg. CXR was concerning for infiltrate as well as edema. They sent him to the ER last pm.  He initially required BiPAP. He was given Lasix 40 mg IV, Solu-medrol 40 mg x 1, nitro paste. He was able to come off the BiPAP. He is currently on O2 at 4 lpm, sats are good.   He is breathing better.  He has had no chest pain.   No palpitations, no presyncope or syncope.    Past Medical History:  Diagnosis Date  . Anginal pain ([place])   . Arthritis    "all over"  . Cancer ([place])   . Coronary artery disease   . Depression ~ 29; ~ 1990   PTSD/Mania   . GERD (gastroesophageal reflux disease)   . High cholesterol   . Hypertension   . Hyperthyroidism   . Neuropathy   . Osteoporosis     Past Surgical  History:  Procedure Laterality Date  . BACK SURGERY    . CARDIAC CATHETERIZATION  09/2007  . CARDIAC CATHETERIZATION N/A 05/08/2014   Procedure: CORONARY STENT INTERVENTION;  Surgeon: [name], MD; 3.518 mm Xience Alpine DES to the RCA   . LEFT HEART CATHETERIZATION WITH CORONARY ANGIOGRAM N/A 05/08/2014   Procedure: LEFT HEART CATHETERIZATION WITH CORONARY ANGIOGRAM;  Surgeon: [name], MD; LAD 100% (old), CFX 20%, OM1 30%, pRCA 30%, dRCA 95%, EF 55%     . LUMBAR LAMINECTOMY/DECOMPRESSION MICRODISCECTOMY  2011     Prior to Admission medications   Medication Sig Start Date End Date Taking? Authorizing Provider  acetaminophen (TYLENOL) 500 MG tablet Take 500-1,000 mg by mouth every 6 (six) hours as needed for mild pain.    Yes [provider]  apixaban (ELIQUIS) 5 MG TABS tablet Take 1 tablet (5 mg total) by mouth 2 (two) times daily. 04/20/17  Yes Croitoru, Mihai, MD  Carboxymethylcellulose Sod PF 1 % GEL Place 1 drop into both eyes 2 (two) times daily.    Yes [provider]  Cyanocobalamin (B-12 COMPLIANCE INJECTION) 1000 MCG/ML KIT Inject 1,000 mcg as directed See admin instructions. On the 20th of every month   Yes [provider]  dextromethorphan-guaiFENesin (MUCINEX DM) 30-600 MG 12hr tablet  Take 2 tablets by mouth 2 (two) times daily. 07/16/17  Yes Sheikh, Omair Latif, DO  diltiazem (CARDIZEM CD) 240 MG 24 hr capsule Take 1 capsule (240 mg total) by mouth daily. 07/17/17  Yes Sheikh, Omair Latif, DO  Emollient [facility]) OINT Apply 1 application topically 3 (three) times daily. To penis   Yes [provider]  finasteride (PROSCAR) 5 MG tablet Take 5 mg by mouth daily.   Yes [provider]  furosemide (LASIX) 40 MG tablet Take 40 mg by mouth once.   Yes [provider]  isosorbide mononitrate (IMDUR) 30 MG 24 hr tablet Take 30 mg by mouth daily.   Yes [provider]  levalbuterol (XOPENEX) 0.63 MG/3ML nebulizer  solution Take 3 mLs (0.63 mg total) by nebulization every 3 (three) hours as needed for wheezing. 07/16/17  Yes Sheikh, Omair Latif, DO  levalbuterol (XOPENEX) 1.25 MG/0.5ML nebulizer solution Take 1.25 mg by nebulization 3 (three) times daily. Patient taking differently: Take 1.25 mg by nebulization every 4 (four) hours.  07/16/17  Yes Sheikh, Omair Latif, DO  lisinopril (PRINIVIL,ZESTRIL) 20 MG tablet Take 1 tablet (20 mg total) by mouth daily. 07/17/17  Yes Sheikh, Omair Latif, DO  loratadine (CLARITIN) 10 MG tablet Take 1 tablet (10 mg total) by mouth daily. 02/28/17  Yes Emokpae, Courage, MD  metoprolol (LOPRESSOR) 100 MG tablet Take 100 mg by mouth 2 (two) times daily.   Yes [provider]  Multiple Vitamins-Minerals (PRESERVISION AREDS 2+MULTI VIT) CAPS Take 1 capsule by mouth 2 (two) times daily.    Yes [provider]  nystatin (NYSTATIN) powder Apply topically 3 (three) times daily. Affected areas on groin and buttocks   Yes [provider]  polyethylene glycol (MIRALAX / GLYCOLAX) packet Take 17 g by mouth 2 (two) times daily. 07/16/17  Yes Sheikh, Omair Latif, DO  ranitidine (ZANTAC) 300 MG tablet Take 300 mg by mouth at bedtime.   Yes [provider]  tamsulosin (FLOMAX) 0.4 MG CAPS capsule Take 1 capsule (0.4 mg total) by mouth daily after supper. 12/02/15  Yes Barrett, Evelene Croon, PA-C  tiotropium (SPIRIVA HANDIHALER) 18 MCG inhalation capsule Place 1 capsule (18 mcg total) into inhaler and inhale daily. 02/27/17 02/27/18 Yes Emokpae, Courage, MD  traZODone (DESYREL) 50 MG tablet Take 50 mg by mouth at bedtime.    Yes [provider]  Vitamin D, Cholecalciferol, 1000 units TABS Take 1,000 Units by mouth at bedtime.   Yes [provider]  pantoprazole (PROTONIX) 40 MG tablet Take 1 tablet (40 mg total) by mouth daily. Patient not taking: Reported on 07/29/2017 05/11/14   Barrett, Evelene Croon, PA-C  QUEtiapine (SEROQUEL) 25 MG tablet Take 0.5 tablets  (12.5 mg total) by mouth at bedtime as needed for up to 7 days (agitation or delirium). Patient not taking: Reported on 09/02/2017 08/01/17 09/02/25  Elodia Florence., MD    Inpatient Medications: Scheduled Meds: . apixaban  5 mg Oral BID  . diltiazem  240 mg Oral Daily  . finasteride  5 mg Oral Daily  . furosemide  60 mg Intravenous Q12H  . isosorbide mononitrate  30 mg Oral Daily  . methylPREDNISolone (SOLU-MEDROL) injection  40 mg Intravenous Q12H  . metoprolol tartrate  100 mg Oral BID  . pantoprazole  40 mg Oral Daily  . potassium chloride  40 mEq Oral Daily  . sodium chloride flush  3 mL Intravenous Q12H  . tamsulosin  0.4 mg Oral QPC supper  .  tiotropium  18 mcg Inhalation Daily   Continuous Infusions: . sodium chloride     PRN Meds: sodium chloride, acetaminophen, levalbuterol, ondansetron (ZOFRAN) IV, sodium chloride flush  Allergies:    Allergies  Allergen Reactions  . Cortisone Other (See Comments)    Causes emotional problems     Social History:   Social History   Socioeconomic History  . Marital status: Divorced    Spouse name: Not on file  . Number of children: Not on file  . Years of education: Not on file  . Highest education level: Not on file  Occupational History  . Not on file  Social Needs  . Financial resource strain: Not on file  . Food insecurity:    Worry: Not on file    Inability: Not on file  . Transportation needs:    Medical: Not on file    Non-medical: Not on file  Tobacco Use  . Smoking status: Former Smoker    Packs/day: 1.00    Years: 20.00    Pack years: 20.00    Types: Cigarettes    Last attempt to quit: 01/12/1962    Years since quitting: 55.6  . Smokeless tobacco: Never Used  Substance and Sexual Activity  . Alcohol use: No    Alcohol/week: 0.0 standard drinks    Comment: 05/08/2014 "he's been in [place] for ~!35 yrs"  . Drug use: No  . Sexual activity: Not on file  Lifestyle  . Physical activity:    Days per week:  Not on file    Minutes per session: Not on file  . Stress: Not on file  Relationships  . Social connections:    Talks on phone: Not on file    Gets together: Not on file    Attends religious service: Not on file    Active member of club or organization: Not on file    Attends meetings of clubs or organizations: Not on file    Relationship status: Not on file  . Intimate partner violence:    Fear of current or ex partner: Not on file    Emotionally abused: Not on file    Physically abused: Not on file    Forced sexual activity: Not on file  Other Topics Concern  . Not on file  Social History Narrative   He was in [name] youth [place] as a child. He emigrated to the [place] after World War II. He was staying with his host family in [place] when he got drafted for [place].      He now lives in [place] and his daughter helps in his care. Currently at [facility].    Family History:   Family History  Problem Relation Age of Onset  . CAD Mother   . Asthma Mother    Family Status:  Family Status  Relation Name Status  . Mother  Deceased  . Father  Deceased  . MGM  Deceased  . MGF  Deceased  . PGM  Deceased  . PGF  Deceased    ROS:  Please see the history of present illness.  All other ROS reviewed and negative.     Physical Exam/Data:   Vitals:   09/02/17 0945 09/02/17 1044 09/02/17 1200 09/02/17 1217  BP: (!) 159/69 (!) 166/97  (!) 151/85  Pulse: 71 (!) 109  79  Resp: (!) 21 18  (!) 26  Temp:    98.9 F (37.2 C)  TempSrc:    Oral  SpO2:  96% 97%  98%  Weight:   87.8 kg     Intake/Output Summary (Last 24 hours) at 09/02/2017 1310 Last data filed at 09/02/2017 1136 Gross per 24 hour  Intake -  Output 2051 ml  Net -2051 ml   Filed Weights   09/02/17 1200  Weight: 87.8 kg   Body mass index is 27 kg/m.  General:   Elderly male,   Chronically ill  HEENT: normal Lymph: no adenopathy Neck: JVD approximately 9 cm Endocrine:  No  thryomegaly Vascular: No carotid bruits; 4/4 extremity pulses 2+, without bruits  Cardiac:  normal S1, S2; slightly irregular rate and rhythm; no murmur  Lungs:  Reduced breath sounds , especially in the right base,   + E to A egophony.    Rales bilaterally  Abd: soft, nontender, no hepatomegaly  Ext: no edema Musculoskeletal:  No deformities, BUE and BLE strength normal and equal Skin: warm and dry  Neuro:  Hard of hearnig  Psych:  Normal affect   EKG:  The EKG was personally reviewed and demonstrates: 8/21, sinus rhythm, right bundle branch block is old, no acute ischemic changes Telemetry:  Telemetry was personally reviewed and demonstrates: Sinus rhythm with frequent PACs and some PVCs  Relevant CV Studies:  ECHO: 04/19/2017 - Left ventricle: The cavity size was normal. Wall thickness was increased in a pattern of mild LVH. There was mild focal basal hypertrophy of the septum. Systolic function was normal. The estimated ejection fraction was in the range of 50% to 55%. Wall motion was normal; there were no regional wall motion abnormalities. - Aortic valve: There was trivial regurgitation. - Ascending aorta: The ascending aorta was mildly dilated. - Mitral valve: There was mild regurgitation. - Left atrium: The atrium was severely dilated. - Pulmonary arteries: PA peak pressure: 33 mm Hg (S).   CATH: 05/08/2014 ANGIOGRAPHY:   The left main coronary artery was angiographically normal and bifurcated into the LAD and left circumflex coronary artery.   The LAD  Was occluded proximally after the takeoff of the very proximal septal perforating artery.  There was collateralization to the diagonal vessel in the mid distal LAD system both from right to left and mild left to left collaterals.  The left circumflex coronary artery was  A moderate size vessel that gave rise to one major bifurcating obtuse marginal branch.  There was mild 20% proximal circumflex stenosis and 30%  mid OM stenosis.  The RCA was a large dominant vessel that gave rise to a large PDA and PLA vessel.  There was a focal 95% stenosis after the acute margin and proximal to the takeoff of a large PDA system.  There were collaterals supplying the LAD which were jeopardized by this stenosis.  Left ventriculography revealed normal global LV contractility with mild distal anterolateral hypocontractility.  Ejection fraction is at least 55%. There was no evidence for mitral regurgitation.    Following PCI to the large dominant RCA the 95% stenosis was reduced to 0% with ultimate insertion of a science Alpine 3.518 mm DES stent postdilated to 3.7 mm. There was brisk TIMI-3 flow.  There was no for dissection.  There were significantly improved collaterals supplying the RCA following the PCI procedure.   Total contrast used: 160 cc  Omnipaque  IMPRESSION:  Preserved global LV contractility with mild distal anterolateral hypocontractility an ejection fraction of at least 55%.  Multivessel CAD with old total occlusion of the proximal LAD with left to left and right to left  collaterals, mild 20% proximal circumflex and 30% mid OM stenosis; and 30% proximal RCA stenosis with a 95% distal RCA stenosis proximal to the PDA takeoff with jeopardized collaterals to the LAD.  Successful PCI with PTCA/DES stenting with insertion of a 3.518 mm Xience Alpine DES stent postdilated 3.7 mm the 95% RCA stenosis being reduced to 0%.   RECOMMENDATION:  The patient will continue with dual antiplatelet therapy for minimum of one-year.  Medical therapy will be continued for his concomitant CAD with previously documented old total LAD occlusion with collateralization.  The collaterals are now improved following PCI to the RCA.   Laboratory Data:  Chemistry Recent Labs  Lab 09/01/17 2323  NA 142  K 4.0  CL 107  CO2 23  GLUCOSE 161*  BUN 16  CREATININE 1.18  CALCIUM 9.2  GFRNONAA 52*  GFRAA >60   ANIONGAP 12    Lab Results  Component Value Date   ALT 31 07/29/2017   AST 31 07/29/2017   ALKPHOS 116 07/29/2017   BILITOT 0.9 07/29/2017   Hematology Recent Labs  Lab 09/01/17 2323 09/02/17 1159  WBC 13.7* 9.9  RBC 5.01 5.07  HGB 15.0 15.3  HCT 47.3 46.8  MCV 94.4 92.3  MCH 29.9 30.2  MCHC 31.7 32.7  RDW 14.6 14.5  PLT 229 228   Cardiac Enzymes Recent Labs  Lab 09/02/17 0945  TROPONINI <0.03    Recent Labs  Lab 09/01/17 2330  TROPIPOC 0.02    BNP Recent Labs  Lab 09/02/17 1159  BNP 553.4*    TSH:  Lab Results  Component Value Date   TSH 0.626 09/02/2017   Lipids: Lab Results  Component Value Date   CHOL 111 05/10/2014   HDL 30 (L) 05/10/2014   LDLCALC 69 05/10/2014   TRIG 60 05/10/2014   CHOLHDL 3.7 05/10/2014   HgbA1c:No results found for: HGBA1C Magnesium:  Magnesium  Date Value Ref Range Status  09/02/2017 1.8 1.7 - 2.4 mg/dL Final    Comment:    Performed at [facility], [place] [address] [place], [place] [address]     Radiology/Studies:  Dg Chest Portable 1 View  Result Date: 09/01/2017 CLINICAL DATA:  Dyspnea EXAM: PORTABLE CHEST 1 VIEW COMPARISON:  07/29/2017 FINDINGS: Cardiomegaly is noted with aortic atherosclerosis. New bilateral pleural effusions with perihilar increase in interstitial and vascular markings compatible with moderate-to-marked pulmonary edema. Superimposed pneumonia would be difficult to entirely exclude but findings are believed more likely from pulmonary edema with layering effusions. No acute osseous abnormality. IMPRESSION: Cardiomegaly with aortic atherosclerosis. Interval development of diffuse pulmonary edema with layering bilateral pleural effusions. Electronically Signed   By: Ashley Royalty M.D.   On: 09/01/2017 23:46    Assessment and Plan:   1.  Acute on chronic diastolic CHF: -According to the records from [facility], his dry weight is normally between 194 and 196 pounds. - He jumped up to  198 pounds and his weight continued to trend up, it was 203 pounds the day of admission. - He is currently on Lasix 60 mg IV twice daily, continue this and follow renal function, strict intake/output and daily weights. - He is approximately 5 pounds above his dry weight.  2. CAD:  -No ischemic symptoms -Continue metoprolol, Imdur.  No aspirin due to Eliquis. - He is currently not on a statin, the reason for this is unclear. -It was discontinued during his hospitalization in July, no reason described despite reading multiple notes. -Restart atorvastatin 20  mg daily.  3.  Persistent atrial fibrillation: -Patient is currently in sinus rhythm.  He is on metoprolol 100 mg twice daily and anticoagulated with Eliquis. -Continue current therapy.  Otherwise, per IM Principal Problem:   Pulmonary edema Active Problems:   Essential hypertension   Hyperthyroidism, subclinical   Coronary artery disease of native artery of native heart with stable angina pectoris (HCC)   Hypercholesterolemia   Coronary artery disease due to lipid rich plaque   Acute on chronic diastolic congestive heart failure (HCC)   Atrial fibrillation, chronic (HCC)   COPD (chronic obstructive pulmonary disease) ([place])   Respiratory failure ([place])     For questions or updates, please contact [facility] Please consult [email or web address] for contact info under Cardiology/STEMI.   Signed, [name], PA-C  09/02/2017 1:10 PM  Attending Note:   The patient was seen and examined.  Agree with assessment and plan as noted above.  Changes made to the above note as needed.  Patient seen and independently examined with [name], PA .   We discussed all aspects of the encounter. I agree with the assessment and plan as stated above.  1.  Acute congestive heart failure: The patient had normal left ventricular systolic function by echo in June, 2019.  There was no mention of diastolic function.  It is possible that he may  be eating some extra salt because the food is prepared by someone else.  He does not add salt to his food.  He denies any ischemic symptoms.  His exam is also consistent with right lower lobe infiltrate.  He is got consolidation breath sounds and has E to A changes in the right base   Agree with diuresis Needs incentive spirometer Nebs Gave instructions on taking meds with apple sauce to avoid aspiration. ( does not always do that according to daughter )  Daughter says he has profound mental status changes with steroids - would avoid for now if we can      I have spent a total of 40 minutes with patient reviewing hospital  notes , telemetry, EKGs, labs and examining patient as well as establishing an assessment and plan that was discussed with the patient. > 50% of time was spent in direct patient care.     Thayer Headings, Brooke Bonito., MD, [facility] 09/02/2017, 2:31 PM [address]  [place] Pager [phone]

## 2017-09-02 NOTE — ED Notes (Signed)
Per daughter, pt becomes delirious when given prednisone; requesting to be present if pt receives prednisone during his stay.

## 2017-09-02 NOTE — ED Notes (Signed)
Respiratory called for abg; meal tray ordered for when pt is off Bipap

## 2017-09-02 NOTE — Evaluation (Signed)
Clinical/Bedside Swallow Evaluation Patient Details  Name: [name] [name] MRN: [id number] Date of Birth: [birth year]  Today's Date: 09/02/2017 Time: SLP Start Time (ACUTE ONLY): 1430 SLP Stop Time (ACUTE ONLY): 1515 SLP Time Calculation (min) (ACUTE ONLY): 45 min  Past Medical History:  Past Medical History:  Diagnosis Date  . Anginal pain ([place])   . Arthritis    "all over"  . Cancer ([place])   . Coronary artery disease   . Depression ~ 73; ~ 1990   PTSD/Mania   . GERD (gastroesophageal reflux disease)   . High cholesterol   . Hypertension   . Hyperthyroidism   . Neuropathy   . Osteoporosis    Past Surgical History:  Past Surgical History:  Procedure Laterality Date  . BACK SURGERY    . CARDIAC CATHETERIZATION  09/2007  . CARDIAC CATHETERIZATION N/A 05/08/2014   Procedure: CORONARY STENT INTERVENTION;  Surgeon: [name], MD; 3.518 mm Xience Alpine DES to the RCA   . LEFT HEART CATHETERIZATION WITH CORONARY ANGIOGRAM N/A 05/08/2014   Procedure: LEFT HEART CATHETERIZATION WITH CORONARY ANGIOGRAM;  Surgeon: [name], MD; LAD 100% (old), CFX 20%, OM1 30%, pRCA 30%, dRCA 95%, EF 55%     . LUMBAR LAMINECTOMY/DECOMPRESSION MICRODISCECTOMY  2011   HPI:  [name] [name] a 83 y.o.malewith medical history significant ofHTN, HLD, A. fib on Eliquis, CAD(DES to LAD in 2016),recent echo on 07/11/17 with an EF of 65-70%,hyperthyroidism, PTSD,and descending [facility]. Facility tried to give patient DuoNeb 2 and 40 mg of oral Lasix without improvement. Initially patient was found to be tachypneic, hypoxic with oxygen saturation in the 70s. Also noted to be obtunded and placed on CPAP. Patient brought in via EMS. Oxygen saturation upon presentation was 91%. EMS also gave patient 125 mg of Solu-Medrol enroute. Chest x-ray showedCardiomegaly with aortic atherosclerosis. Interval development of diffuse pulmonary edema  with layering bilateral pleural effusions   Assessment / Plan / Recommendation Clinical Impression   Pt's presentation on bedside swallow evaluation today is consistent with his multiple recent objective and bedside swallow evaluations.  Pt had immediate coughing following consumption of mixed solid and liquid consistencies but no other overt s/s of aspiration were evident with solids or liquids in isolation.  Daughter reports that pt "chokes" when taking large sips of liquids or consuming mixed consistencies at baseline.  SLP provided skilled education regarding results of today's swallow evaluation and other clinical indicators of pt's overall toleration of recent PO intake (I.e. Results of most recent chest x ray, afebrile, minimal s/s of aspiration at bedside).  All questions were answered to her satisfaction at this time.  Recommend that pt remain on regular textures, thin liquids, meds whole in puree; full supervision for use of swallowing precautions, no straws, upright for meals and 30 minutes after meal, slow rate, small bites/sips.    SLP Visit Diagnosis: Dysphagia, unspecified (R13.10)    Aspiration Risk  Mild aspiration risk    Diet Recommendation Regular;Thin liquid   Liquid Administration via: Cup;Straw Medication Administration: Whole meds with puree Supervision: Full supervision/cueing for compensatory strategies Compensations: Minimize environmental distractions;Slow rate;Small sips/bites Postural Changes: Seated upright at 90 degrees;Remain upright for at least 30 minutes after po intake    Other  Recommendations Oral Care Recommendations: Oral care BID   Follow up Recommendations Skilled Nursing facility      Frequency and Duration min 1 x/week  (1 additional session )  Prognosis Prognosis for Safe Diet Advancement: Good      Swallow Study   General HPI: [name] [name] a 84 y.o.malewith medical history significant ofHTN, HLD, A. fib on Eliquis, CAD(DES to  LAD in 2016),recent echo on 07/11/17 with an EF of 65-70%,hyperthyroidism, PTSD,and descending AAA[facility]. Facility tried to give patient DuoNeb 2 and 40 mg of oral Lasix without improvement. Initially patient was found to be tachypneic, hypoxic with oxygen saturation in the 70s. Also noted to be obtunded and placed on CPAP. Patient brought in via EMS. Oxygen saturation upon presentation was 91%. EMS also gave patient 125 mg of Solu-Medrol enroute. Chest x-ray showedCardiomegaly with aortic atherosclerosis. Interval development of diffuse pulmonary edema with layering bilateral pleural effusions Type of Study: Bedside Swallow Evaluation Previous Swallow Assessment: 07/31/2017 Diet Prior to this Study: Regular;Thin liquids Temperature Spikes Noted: No Respiratory Status: Nasal cannula History of Recent Intubation: No Behavior/Cognition: Alert;Cooperative;Pleasant mood Oral Cavity Assessment: Within Functional Limits Oral Cavity - Dentition: Poor condition;Adequate natural dentition Vision: Functional for self-feeding Self-Feeding Abilities: Able to feed self Patient Positioning: Upright in bed Baseline Vocal Quality: Normal    Oral/Motor/Sensory Function Overall Oral Motor/Sensory Function: Within functional limits   Ice Chips     Thin Liquid Thin Liquid: Within functional limits    Nectar Thick     Honey Thick     Puree     Solid     Solid: Within functional limits      [name] [name] 09/02/2017,3:40 PM

## 2017-09-02 NOTE — H&P (Addendum)
[facility] History and Physical  Chibuikem Thang BEM:[id number] DOB: 07/29/1925 DOA: 09/01/2017  Referring physician  PCP: [name], MD   Chief Complaint shortness of breath  HPI:  [name] [name] a 84 y.o.malewith medical history significant ofHTN, HLD, A. fib on Eliquis, CAD(DES to LAD in 2016),recent echo on 07/11/17 with an EF of 65-70%, hyperthyroidism, PTSD,and descending AAA, who presented from [facility] for shortness of breath for the past 4 hours. Facility tried to give patient DuoNeb 2 and 40 mg of oral Lasix without improvement. Initially patient was found to be tachypneic, hypoxic with oxygen saturation in the 70s. Also noted to be obtunded and placed on CPAP. Patient brought in via EMS. Oxygen saturation upon presentation was 91%. EMS also gave patient 125 mg of Solu-Medrol enroute. Patient is currently on BiPAP and is unable to give me any history. History is obtained from the patient's chart and medical records. ED course BP (!) 196/106   Pulse 88   Temp 97.6 F (36.4 C) (Axillary)   Resp (!) 34   SpO2 98%  Initial EKG shows sinus rhythm with right bundle branch block Chest x-ray showed Cardiomegaly with aortic atherosclerosis. Interval development of diffuse pulmonary edema with layering bilateral pleural effusions Patient is currently on BiPAP and is therefore being admitted to step down for acute respiratory failure, secondary to CHF?   Review of Systems: negative for the following  Unable to perform ROS: Acuity of condition       Past Medical History:  Diagnosis Date  . Anginal pain ([place])   . Arthritis    "all over"  . Cancer ([place])   . Coronary artery disease   . Depression ~ 90; ~ 1990   PTSD/Mania   . GERD (gastroesophageal reflux disease)   . High cholesterol   . Hypertension   . Hyperthyroidism   . Neuropathy   . Osteoporosis      Past Surgical History:  Procedure Laterality Date  . BACK SURGERY    . CARDIAC  CATHETERIZATION  09/2007  . CARDIAC CATHETERIZATION N/A 05/08/2014   Procedure: CORONARY STENT INTERVENTION;  Surgeon: [name], MD; 3.518 mm Xience Alpine DES to the RCA   . LEFT HEART CATHETERIZATION WITH CORONARY ANGIOGRAM N/A 05/08/2014   Procedure: LEFT HEART CATHETERIZATION WITH CORONARY ANGIOGRAM;  Surgeon: [name], MD; LAD 100% (old), CFX 20%, OM1 30%, pRCA 30%, dRCA 95%, EF 55%     . LUMBAR LAMINECTOMY/DECOMPRESSION MICRODISCECTOMY  2011      Social History:  reports that he quit smoking about 55 years ago. His smoking use included cigarettes. He has a 20.00 pack-year smoking history. He has never used smokeless tobacco. He reports that he does not drink alcohol or use drugs.    Allergies  Allergen Reactions  . Cortisone Other (See Comments)    Causes emotional problems     Family History  Problem Relation Age of Onset  . CAD Mother   . Asthma Mother          Prior to Admission medications   Medication Sig Start Date End Date Taking? Authorizing Provider  acetaminophen (TYLENOL) 500 MG tablet Take 500-1,000 mg by mouth every 6 (six) hours as needed for mild pain.    Yes [provider]  apixaban (ELIQUIS) 5 MG TABS tablet Take 1 tablet (5 mg total) by mouth 2 (two) times daily. 04/20/17  Yes Croitoru, Mihai, MD  Carboxymethylcellulose Sod PF 1 % GEL Place 1 drop into both eyes 2 (  two) times daily.    Yes [provider]  Cyanocobalamin (B-12 COMPLIANCE INJECTION) 1000 MCG/ML KIT Inject 1,000 mcg as directed See admin instructions. On the 20th of every month   Yes [provider]  dextromethorphan-guaiFENesin (MUCINEX DM) 30-600 MG 12hr tablet Take 2 tablets by mouth 2 (two) times daily. 07/16/17  Yes Sheikh, Omair Latif, DO  diltiazem (CARDIZEM CD) 240 MG 24 hr capsule Take 1 capsule (240 mg total) by mouth daily. 07/17/17  Yes Sheikh, Omair Latif, DO  [facility]) OINT Apply 1 application topically 3 (three) times daily. To penis    Yes [provider]  finasteride (PROSCAR) 5 MG tablet Take 5 mg by mouth daily.   Yes [provider]  furosemide (LASIX) 40 MG tablet Take 40 mg by mouth once.   Yes [provider]  isosorbide mononitrate (IMDUR) 30 MG 24 hr tablet Take 30 mg by mouth daily.   Yes [provider]  levalbuterol (XOPENEX) 0.63 MG/3ML nebulizer solution Take 3 mLs (0.63 mg total) by nebulization every 3 (three) hours as needed for wheezing. 07/16/17  Yes Sheikh, Omair Latif, DO  levalbuterol (XOPENEX) 1.25 MG/0.5ML nebulizer solution Take 1.25 mg by nebulization 3 (three) times daily. Patient taking differently: Take 1.25 mg by nebulization every 4 (four) hours.  07/16/17  Yes Sheikh, Omair Latif, DO  lisinopril (PRINIVIL,ZESTRIL) 20 MG tablet Take 1 tablet (20 mg total) by mouth daily. 07/17/17  Yes Sheikh, Omair Latif, DO  loratadine (CLARITIN) 10 MG tablet Take 1 tablet (10 mg total) by mouth daily. 02/28/17  Yes Emokpae, Courage, MD  metoprolol (LOPRESSOR) 100 MG tablet Take 100 mg by mouth 2 (two) times daily.   Yes [provider]  Multiple Vitamins-Minerals (PRESERVISION AREDS 2+MULTI VIT) CAPS Take 1 capsule by mouth 2 (two) times daily.    Yes [provider]  nystatin (NYSTATIN) powder Apply topically 3 (three) times daily. Affected areas on groin and buttocks   Yes [provider]  polyethylene glycol (MIRALAX / GLYCOLAX) packet Take 17 g by mouth 2 (two) times daily. 07/16/17  Yes Sheikh, Omair Latif, DO  ranitidine (ZANTAC) 300 MG tablet Take 300 mg by mouth at bedtime.   Yes [provider]  tamsulosin (FLOMAX) 0.4 MG CAPS capsule Take 1 capsule (0.4 mg total) by mouth daily after supper. 12/02/15  Yes Barrett, Evelene Croon, PA-C  tiotropium (SPIRIVA HANDIHALER) 18 MCG inhalation capsule Place 1 capsule (18 mcg total) into inhaler and inhale daily. 02/27/17 02/27/18 Yes Emokpae, Courage, MD  traZODone (DESYREL) 50 MG tablet Take 50 mg by mouth at  bedtime.    Yes [provider]  Vitamin D, Cholecalciferol, 1000 units TABS Take 1,000 Units by mouth at bedtime.   Yes [provider]  pantoprazole (PROTONIX) 40 MG tablet Take 1 tablet (40 mg total) by mouth daily. Patient not taking: Reported on 07/29/2017 05/11/14   Barrett, Evelene Croon, PA-C  QUEtiapine (SEROQUEL) 25 MG tablet Take 0.5 tablets (12.5 mg total) by mouth at bedtime as needed for up to 7 days (agitation or delirium). Patient not taking: Reported on 09/02/2017 08/01/17 09/02/25  Elodia Florence., MD     Physical Exam: Vitals:   09/02/17 0640 09/02/17 0645 09/02/17 0700 09/02/17 0737  BP: (!) 168/92 (!) 157/85 (!) 179/98   Pulse: (!) 105 97 95   Resp: 16 (!) 21 (!) 23   Temp:      TempSrc:      SpO2: 98% 95% 95%  96%        Vitals:   09/02/17 0640 09/02/17 0645 09/02/17 0700 09/02/17 0737  BP: (!) 168/92 (!) 157/85 (!) 179/98   Pulse: (!) 105 97 95   Resp: 16 (!) 21 (!) 23   Temp:      TempSrc:      SpO2: 98% 95% 95% 96%   Constitutional: NAD, calm, comfortable Eyes: PERRL, lids and conjunctivae normal ENMT: Mucous membranes are moist. Posterior pharynx clear of any exudate or lesions.Normal dentition.  Neck: normal, supple, no masses, no thyromegaly Respiratory: clear to auscultation bilaterally, no wheezing, no crackles. Normal respiratory effort. No accessory muscle use.  Cardiovascular: Regular rate and rhythm, no murmurs / rubs / gallops. No extremity edema. 2+ pedal pulses. No carotid bruits.  Abdomen: no tenderness, no masses palpated. No hepatosplenomegaly. Bowel sounds positive.  Musculoskeletal: no clubbing / cyanosis. No joint deformity upper and lower extremities. Good ROM, no contractures. Normal muscle tone. Extremities have 2+ edema, full range of motion is present. Skin: no rashes, lesions, ulcers. No induration Neurologic: CN 2-12 grossly intact. Sensation intact, DTR normal. Strength 5/5 in all 4.  Psychiatric: Normal  judgment and insight. Alert and oriented x 3. Normal mood.     Labs on Admission: I have personally reviewed following labs and imaging studies  CBC: Recent Labs  Lab 09/01/17 2323  WBC 13.7*  HGB 15.0  HCT 47.3  MCV 94.4  PLT 741    Basic Metabolic Panel: Recent Labs  Lab 09/01/17 2323  NA 142  K 4.0  CL 107  CO2 23  GLUCOSE 161*  BUN 16  CREATININE 1.18  CALCIUM 9.2    GFR: CrCl cannot be calculated (Unknown ideal weight.).  Liver Function Tests: No results for input(s): AST, ALT, ALKPHOS, BILITOT, PROT, ALBUMIN in the last 168 hours. No results for input(s): LIPASE, AMYLASE in the last 168 hours. No results for input(s): AMMONIA in the last 168 hours.  Coagulation Profile: No results for input(s): INR, PROTIME in the last 168 hours. No results for input(s): DDIMER in the last 72 hours.  Cardiac Enzymes: No results for input(s): CKTOTAL, CKMB, CKMBINDEX, TROPONINI in the last 168 hours.  BNP (last 3 results) No results for input(s): PROBNP in the last 8760 hours.  HbA1C: No results for input(s): HGBA1C in the last 72 hours. No results found for: HGBA1C   CBG: No results for input(s): GLUCAP in the last 168 hours.  Lipid Profile: No results for input(s): CHOL, HDL, LDLCALC, TRIG, CHOLHDL, LDLDIRECT in the last 72 hours.  Thyroid Function Tests: No results for input(s): TSH, T4TOTAL, FREET4, T3FREE, THYROIDAB in the last 72 hours.  Anemia Panel: No results for input(s): VITAMINB12, FOLATE, FERRITIN, TIBC, IRON, RETICCTPCT in the last 72 hours.  Urine analysis:    Component Value Date/Time   COLORURINE YELLOW 07/29/2017 2202   APPEARANCEUR CLEAR 07/29/2017 2202   LABSPEC 1.011 07/29/2017 2202   PHURINE 7.0 07/29/2017 2202   GLUCOSEU NEGATIVE 07/29/2017 2202   HGBUR MODERATE (A) 07/29/2017 2202   BILIRUBINUR NEGATIVE 07/29/2017 2202   KETONESUR 5 (A) 07/29/2017 2202   PROTEINUR NEGATIVE 07/29/2017 2202   UROBILINOGEN 0.2 11/09/2014 1532    NITRITE NEGATIVE 07/29/2017 2202   LEUKOCYTESUR NEGATIVE 07/29/2017 2202    Sepsis Labs: '@LABRCNTIP'$ (procalcitonin:4,lacticidven:4) )No results found for this or any previous visit (from the past 240 hour(s)).       Radiological Exams on Admission: Dg Chest Portable 1 View  Result Date: 09/01/2017 CLINICAL DATA:  Dyspnea EXAM: PORTABLE CHEST 1 VIEW COMPARISON:  07/29/2017 FINDINGS: Cardiomegaly is noted with aortic atherosclerosis. New bilateral pleural effusions with perihilar increase in interstitial and vascular markings compatible with moderate-to-marked pulmonary edema. Superimposed pneumonia would be difficult to entirely exclude but findings are believed more likely from pulmonary edema with layering effusions. No acute osseous abnormality. IMPRESSION: Cardiomegaly with aortic atherosclerosis. Interval development of diffuse pulmonary edema with layering bilateral pleural effusions. Electronically Signed   By: Ashley Royalty M.D.   On: 09/01/2017 23:46   Dg Chest Portable 1 View  Result Date: 09/01/2017 CLINICAL DATA:  Dyspnea EXAM: PORTABLE CHEST 1 VIEW COMPARISON:  07/29/2017 FINDINGS: Cardiomegaly is noted with aortic atherosclerosis. New bilateral pleural effusions with perihilar increase in interstitial and vascular markings compatible with moderate-to-marked pulmonary edema. Superimposed pneumonia would be difficult to entirely exclude but findings are believed more likely from pulmonary edema with layering effusions. No acute osseous abnormality. IMPRESSION: Cardiomegaly with aortic atherosclerosis. Interval development of diffuse pulmonary edema with layering bilateral pleural effusions. Electronically Signed   By: Ashley Royalty M.D.   On: 09/01/2017 23:46      EKG: Independently reviewed. Sinus rhythm Right bundle branch block When compared with ECG of 07/29/2017,  Assessment/Plan Principal Problem:  acute hypoxic respiratory failure requiring NIPPV, Pulmonary edema Suspect  acute on chronic diastolic heart failure Precipitating cause is unclear Doubt PE as the patient is anticoagulated with Eliquis, no arrhythmias noted, he is in sinus rhythm, no evidence of pneumonia on chest x-ray, however patient has a history of dysphagia therefore will obtain speech therapy evaluation to rule out chronic aspiration Will admit to stepdown as he is currently on BiPAP Wean off BiPAP with continued diuresis ABG Continue diuresis with IV Lasix Initiate diet once the patient is off BiPAP Will obtain pro-calcitonin  , daughter concerned about aspiration pneumonia, however patient's white count is normal and he is afebrile   acute on chronic diastolic heart failure//chronic CAD history of chronic LAD occlusion w/ DES RCA 2016 by Dr Claiborne Billings Patient does have an elevated JVD  , 1+ pitting edema Continue diuresis with IV Lasix and wean patient off of BiPAP Repeat 2-D echo to ensure that EF is still the same  History of Atrial fibrillation,  Currently in sinus rhythm with right bundle branch block CHADSVasc 5 (age 75, HTN, CAD, CHF).  On Eliquis Continue beta blocker  History of COPD, with acute exacerbation Status post receiving IV Solu-Medrol Will continue low-dose Solu-Medrol as this can exacerbate CHF, nebulizer treatments Discontinue Spiriva    Essential hypertension Continue metoprolol, Cardizem Hold ACE inhibitor in the setting of diuretics and follow renal function closely    Hyperthyroidism, subclinical Check TSH and free T4,        Hypercholesterolemia Currently none statin, was recently discontinued last admission on 08/01/17  [place] Continue Flomax and Proscar  Gastroesophageal reflux disease Continue Protonix  [place], on Recent admission, on Seroquel    DVT prophylaxis: Eliquis     Code Status Orders full code  (From admission, onward)        consults called: none  Family Communication: Admission, patients condition and plan of care  including tests being ordered have been discussed with the patient  who indicates understanding and agree with the plan and Code Status   Admission status:  The appropriate patient status for this patient is INPATIENT. Inpatient status is judged to be reasonable and necessary in order to provide the required intensity of service to ensure the patient's safety. The  patient's presenting symptoms, physical exam findings, and initial radiographic and laboratory data in the context of their chronic comorbidities is felt to place them at high risk for further clinical deterioration. Furthermore, it is not anticipated that the patient will be medically stable for discharge from the hospital within 2 midnights of admission. The following factors support the patient status of inpatient.    "           The patient's presenting respiratory distress, hypoxia, altered mental status, need for BiPAP       * I certify that at the point of admission it is my clinical judgment that the patient will require inpatient hospital care spanning beyond 2 midnights from the point of admission due to high intensity of service, high risk for further deterioration and high frequency of surveillance required.*    Disposition plan: Further plan will depend as patient's clinical course evolves and further radiologic and laboratory data become available. Likely home when stable    At the time of admission, it appears that the appropriate admission status for this patient is INPATIENT . This is judged to be reasonable and necessary in order to provide the required intensity of service to ensure the patient's safety given the presenting symptoms, physical exam findings, and initial radiographic and laboratory data in the context of their chronic comorbidities.   Reyne Dumas MD [facility] Pager [phone]  If 7PM-7AM, please contact night-coverage [email or web address] Password [facility]  09/02/2017, 9:16 AM

## 2017-09-02 NOTE — Progress Notes (Signed)
Patient taken off Bipap due to wean from ABG results. Patient placed on 4L [place] and tolerating well at this time. Will continue to monitor.

## 2017-09-02 NOTE — Progress Notes (Signed)
RT placed pt on BIPAP per MD order. Pt settings are 12/6, and FIO2 40%. Pt tolerating well. RT will continue to monitor.

## 2017-09-03 ENCOUNTER — Inpatient Hospital Stay (HOSPITAL_COMMUNITY): Payer: Medicare Other

## 2017-09-03 DIAGNOSIS — R0600 Dyspnea, unspecified: Secondary | ICD-10-CM

## 2017-09-03 LAB — BASIC METABOLIC PANEL
Anion gap: 9 (ref 5–15)
BUN: 28 mg/dL — ABNORMAL HIGH (ref 8–23)
CO2: 30 mmol/L (ref 22–32)
CREATININE: 1.31 mg/dL — AB (ref 0.61–1.24)
Calcium: 9.4 mg/dL (ref 8.9–10.3)
Chloride: 104 mmol/L (ref 98–111)
GFR, EST AFRICAN AMERICAN: 54 mL/min — AB (ref >60)
GFR, EST NON AFRICAN AMERICAN: 46 mL/min — AB (ref >60)
Glucose, Bld: 129 mg/dL — ABNORMAL HIGH (ref 70–99)
Potassium: 3.3 mmol/L — ABNORMAL LOW (ref 3.5–5.1)
Sodium: 143 mmol/L (ref 135–145)

## 2017-09-03 LAB — ECHOCARDIOGRAM COMPLETE
Height: 71 in
WEIGHTICAEL: 3097.02 [oz_av]

## 2017-09-03 LAB — PROCALCITONIN: PROCALCITONIN: 0.31 ng/mL

## 2017-09-03 MED ORDER — PREDNISONE 20 MG PO TABS
40.0000 mg | ORAL_TABLET | Freq: Every day | ORAL | Status: DC
  Administered 2017-09-03: 40 mg via ORAL
  Filled 2017-09-03: qty 2

## 2017-09-03 MED ORDER — POTASSIUM CHLORIDE CRYS ER 20 MEQ PO TBCR
40.0000 meq | EXTENDED_RELEASE_TABLET | Freq: Once | ORAL | Status: AC
  Administered 2017-09-03: 40 meq via ORAL
  Filled 2017-09-03: qty 2

## 2017-09-03 NOTE — Progress Notes (Addendum)
Progress Note  Patient Name: [name] [name] Date of Encounter: 09/03/2017  Primary Cardiologist: [name], MD  Subjective   Pt reports feeling better than yesterday. No chest pain or palpitations.   Inpatient Medications    Scheduled Meds: . apixaban  5 mg Oral BID  . diltiazem  240 mg Oral Daily  . finasteride  5 mg Oral Daily  . furosemide  60 mg Intravenous Q12H  . isosorbide mononitrate  30 mg Oral Daily  . metoprolol tartrate  100 mg Oral BID  . pantoprazole  40 mg Oral Daily  . potassium chloride  40 mEq Oral Daily  . sodium chloride flush  3 mL Intravenous Q12H  . tamsulosin  0.4 mg Oral QPC supper  . tiotropium  18 mcg Inhalation Daily   Continuous Infusions: . sodium chloride     PRN Meds: sodium chloride, acetaminophen, levalbuterol, ondansetron (ZOFRAN) IV, sodium chloride flush   Vital Signs    Vitals:   09/02/17 2206 09/02/17 2300 09/03/17 0811 09/03/17 0914  BP:  (!) 126/52  135/81  Pulse:  77  77  Resp:  20  20  Temp:  98.2 F (36.8 C)  (!) 97.4 F (36.3 C)  TempSrc:  Oral  Oral  SpO2:  91% 92% 94%  Weight:      Height: 5\' 11"  (1.803 m)       Intake/Output Summary (Last 24 hours) at 09/03/2017 1443 Last data filed at 09/03/2017 1237 Gross per 24 hour  Intake 240 ml  Output 3550 ml  Net -3310 ml   Filed Weights   09/02/17 1200  Weight: 87.8 kg   Physical Exam   General: Elderly, NAD Skin: Warm, dry, intact  Head: Normocephalic, atraumatic, clear, moist mucus membranes. Neck: Negative for carotid bruits. No JVD Lungs: Diminished  Bilaterally. No wheezes, rales, or rhonchi. Breathing is unlabored. Cardiovascular: RRR with S1 S2. No murmurs, rubs, gallops, or LV heave appreciated. Abdomen: Soft, non-tender, non-distended with normoactive bowel sounds. No obvious abdominal masses. MSK: Strength and tone appear normal for age. 5/5 in all extremities Extremities: No edema. No clubbing or cyanosis. DP/PT pulses 1+ bilaterally Neuro:  Alert and oriented. No focal deficits. No facial asymmetry. MAE spontaneously. Psych: Responds to questions appropriately with normal affect.    Labs    Chemistry Recent Labs  Lab 09/01/17 2323 09/03/17 0313  NA 142 143  K 4.0 3.3*  CL 107 104  CO2 23 30  GLUCOSE 161* 129*  BUN 16 28*  CREATININE 1.18 1.31*  CALCIUM 9.2 9.4  GFRNONAA 52* 46*  GFRAA >60 54*  ANIONGAP 12 9    Hematology Recent Labs  Lab 09/01/17 2323 09/02/17 1159  WBC 13.7* 9.9  RBC 5.01 5.07  HGB 15.0 15.3  HCT 47.3 46.8  MCV 94.4 92.3  MCH 29.9 30.2  MCHC 31.7 32.7  RDW 14.6 14.5  PLT 229 228   Cardiac Enzymes Recent Labs  Lab 09/02/17 0945 09/02/17 1535 09/02/17 2205  TROPONINI <0.03 <0.03 <0.03    Recent Labs  Lab 09/01/17 2330  TROPIPOC 0.02     BNP Recent Labs  Lab 09/02/17 1159  BNP 553.4*    DDimer No results for input(s): DDIMER in the last 168 hours.   Radiology    Dg Chest Portable 1 View  Result Date: 09/01/2017 CLINICAL DATA:  Dyspnea EXAM: PORTABLE CHEST 1 VIEW COMPARISON:  07/29/2017 FINDINGS: Cardiomegaly is noted with aortic atherosclerosis. New bilateral pleural effusions with perihilar increase in  interstitial and vascular markings compatible with moderate-to-marked pulmonary edema. Superimposed pneumonia would be difficult to entirely exclude but findings are believed more likely from pulmonary edema with layering effusions. No acute osseous abnormality. IMPRESSION: Cardiomegaly with aortic atherosclerosis. Interval development of diffuse pulmonary edema with layering bilateral pleural effusions. Electronically Signed   By: Ashley Royalty M.D.   On: 09/01/2017 23:46   Telemetry    09/03/17 NSR with tachy arrhyhtmia this AM - Personally Reviewed  ECG    No new tracing as of 09/03/17- Personally Reviewed  Cardiac Studies   Echocardiogram 09/03/17: Study Conclusions  - Left ventricle: Systolic function was normal. The estimated   ejection fraction was in the  range of 60% to 65%. Wall motion was   normal; there were no regional wall motion abnormalities. Doppler   parameters are consistent with indeterminate mean left atrial   filling pressure. - Aortic valve: There was trivial regurgitation. - Mitral valve: Valve area by pressure half-time: 2.1 cm^2. - Left atrium: The atrium was mildly dilated. - Pulmonary arteries: PA peak pressure: 33 mm Hg (S). - Pericardium, extracardiac: A trivial pericardial effusion was   identified.  ECHO: 04/19/2017 - Left ventricle: The cavity size was normal. Wall thickness was increased in a pattern of mild LVH. There was mild focal basal hypertrophy of the septum. Systolic function was normal. The estimated ejection fraction was in the range of 50% to 55%. Wall motion was normal; there were no regional wall motion abnormalities. - Aortic valve: There was trivial regurgitation. - Ascending aorta: The ascending aorta was mildly dilated. - Mitral valve: There was mild regurgitation. - Left atrium: The atrium was severely dilated. - Pulmonary arteries: PA peak pressure: 33 mm Hg (S).  CATH: 05/08/2014 ANGIOGRAPHY:  IMPRESSION:  Preserved global LV contractility with mild distal anterolateral hypocontractility an ejection fraction of at least 55%.  Multivessel CAD with old total occlusion of the proximal LAD with left to left and right to left collaterals, mild 20% proximal circumflex and 30% mid OM stenosis; and 30% proximal RCA stenosis with a 95% distal RCA stenosis proximal to the PDA takeoff with jeopardized collaterals to the LAD.  Successful PCI with PTCA/DES stenting with insertion of a 3.518 mm Xience Alpine DES stent postdilated 3.7 mm the 95% RCA stenosis being reduced to 0%.  RECOMMENDATION:  The patient will continue with dual antiplatelet therapy for minimum of one-year. Medical therapy will be continued for his concomitant CAD with previously documented old total LAD occlusion  with collateralization. The collaterals are now improved following PCI to the RCA.  Patient Profile     84 y.o. male with a hx of chronic LAD occlusion w/ DES RCA 2016 by Dr Claiborne Billings, GERD, HTN, HLD, hyperthyroid, peripheral neuropathy (feet). EF 50%. Imdur used periodically for angina. HOH (both he and hisolderdaughter), hx RBBB, descending AAA,  who is being seen today for the evaluation of CHF at the request of Dr Allyson Sabal.  Assessment & Plan    1.  Acute on chronic diastolic CHF: -Echocardiogram with normal LVEF at 60-65% today, 09/03/17 -Weight, 193lb yesterday>>dry weight noted to be between 194-198lb -No current weight for today>>will reiterate strict daily weights given admission dx -I&O, net negative 858ml today  -Continue IV Lasix 60mg  IV twice daily>>>creatitine 1.31 today, up from 1.18 on 09/01/17. If creatinine continues to climb, will need to decrease Lasix dose and continue to monitor   2. CAD:  -No ischemic symptoms -Will continue metoprolol, Imdur>>No ASA secondary to Eliquis  -  Continue Lipitor 20mg  daily   3.  Persistent atrial fibrillation: -Pt remains in NSR and controlled on Metoprolol 100mg  twice daily -Anticoagulated with Eliquis  -Pt with some tachyarrthymias this AM, likely in the setting of hypokalemia>>replaced per internal medicine  -CHA2DS2VASc =5 (age, HTN, vascular disease)  Signed, Kathyrn Drown NP-C [place] Pager: [phone] 09/03/2017, 2:43 PM     For questions or updates, please contact   Please consult [email or web address] for contact info under Cardiology/STEMI.  Attending Note:   The patient was seen and examined.  Agree with assessment and plan as noted above.  Changes made to the above note as needed.  Patient seen and independently examined with Kathyrn Drown, NP .   We discussed all aspects of the encounter. I agree with the assessment and plan as stated above.  1.  Shortness of breath: Patient had an echocardiogram that reveals normal  left ventricular systolic function.  He does have diastolic dysfunction.  Also suspect that he has some degree of aspiration. Is diuresed some.  We will need to watch his creatinine closely. Overall I think his lungs sound much better than they did yesterday.  .  Coronary artery disease: He denies having any angina.  Continue atorvastatin.  2.  Paroxysmal atrial fibrillation: He remains in normal sinus rhythm.  Continue metoprolol.  Is on Eliquis.     I have spent a total of 40 minutes with patient reviewing hospital  notes , telemetry, EKGs, labs and examining patient as well as establishing an assessment and plan that was discussed with the patient. > 50% of time was spent in direct patient care.    Thayer Headings, Brooke Bonito., MD, [facility] 09/03/2017, 6:13 PM [address],  [place] Pager [phone]

## 2017-09-03 NOTE — Progress Notes (Signed)
  Echocardiogram 2D Echocardiogram has been performed.  [name] [name] T Gaines Cartmell 09/03/2017, 10:59 AM

## 2017-09-03 NOTE — Discharge Instructions (Addendum)
2000 MG LOW SODIUM DIET LIMIT ALL FLUIDS TO 2 LITERS/QUARTS PER DAY BMET on Wednesday>>results to Dr [employer]   Information on my medicine - ELIQUIS (apixaban)  Why was Eliquis prescribed for you? Eliquis was prescribed for you to reduce the risk of a blood clot forming that can cause a stroke if you have a medical condition called atrial fibrillation (a type of irregular heartbeat).  What do You need to know about Eliquis ? Take your Eliquis TWICE DAILY - one tablet in the morning and one tablet in the evening with or without food. If you have difficulty swallowing the tablet whole please discuss with your pharmacist how to take the medication safely.  Take Eliquis exactly as prescribed by your doctor and DO NOT stop taking Eliquis without talking to the doctor who prescribed the medication.  Stopping may increase your risk of developing a stroke.  Refill your prescription before you run out.  After discharge, you should have regular check-up appointments with your healthcare provider that is prescribing your Eliquis.  In the future your dose may need to be changed if your kidney function or weight changes by a significant amount or as you get older.  What do you do if you miss a dose? If you miss a dose, take it as soon as you remember on the same day and resume taking twice daily.  Do not take more than one dose of ELIQUIS at the same time to make up a missed dose.  Important Safety Information A possible side effect of Eliquis is bleeding. You should call your healthcare provider right away if you experience any of the following: ? Bleeding from an injury or your nose that does not stop. ? Unusual colored urine (red or dark brown) or unusual colored stools (red or black). ? Unusual bruising for unknown reasons. ? A serious fall or if you hit your head (even if there is no bleeding).  Some medicines may interact with Eliquis and might increase your risk of bleeding or clotting  while on Eliquis. To help avoid this, consult your healthcare provider or pharmacist prior to using any new prescription or non-prescription medications, including herbals, vitamins, non-steroidal anti-inflammatory drugs (NSAIDs) and supplements.  This website has more information on Eliquis (apixaban): http://www.eliquis.com/eliquis/home

## 2017-09-03 NOTE — Progress Notes (Signed)
PROGRESS NOTE    [name] [name]  VEH:[id number] DOB: [birth year] DOA: 09/01/2017 PCP: [name], MD    Brief Narrative: 84 year old with past medical history relevant for hypertension, hyperlipidemia, chronic atrial fibrillation on apixaban, coronary artery disease status post stent to LAD in 8366, chronic diastolic heart failure by echo on 07/11/2017, BPH, COPD who presented with acute hypoxic respiratory failure in the setting of likely acute on chronic diastolic heart failure exacerbation.   Assessment & Plan:   Principal Problem:   Pulmonary edema Active Problems:   Essential hypertension   Hyperthyroidism, subclinical   Coronary artery disease of native artery of native heart with stable angina pectoris (HCC)   Hypercholesterolemia   Coronary artery disease due to lipid rich plaque   Acute on chronic diastolic congestive heart failure (HCC)   Atrial fibrillation, chronic (HCC)   COPD (chronic obstructive pulmonary disease) (HCC)   Respiratory failure (HCC)   #) Acute on chronic diastolic heart failure exacerbation: Patient is off BiPAP now and is on proximally 4 L.  He is clinically much improved. -Cardiology consult, appreciate recommendations - Echo pending - Hold home furosemide 40 mg - Continue IV furosemide 60 mg every 12 hours -Strict ins and outs, sodium restricted diet, weigh daily -Telemetry  #) Coronary artery disease status post stent: - Continue isosorbide mononitrate 30 mg daily -Continue metoprolol tartrate 100 mg twice daily  #) Chronic atrial fibrillation: - Continue apixaban 5 mg twice daily -Continue diltiazem 240 mg daily -Continue beta-blocker  #) BPH: -Continue finasteride 5 mg daily -Continue tamsulosin 0.4 mg nightly  #) Hypertension/hyperlipidemia: -Continue beta-blocker and calcium channel blocker -Continue long-acting nitrate  #) COPD: -Continue PRN bronchodilators -Continue oral prednisone 40 mg daily, discontinue IV steroids  as no wheezing  Fluids: Restricted Elect lites: Monitor and supplement Nutrition: Heart healthy diet  Prophylaxis: On apixaban  Disposition: Pending improved respiratory status  Full code    Consultants:   Cardiology  Procedures:   09/03/2017 echo: Pending  Antimicrobials: (specify start and planned stop date. Auto populated tables are space occupying and do not give end dates)  None   Subjective: Patient is sleeping in bed and is quite hard of hearing so difficult to arouse.  Appears to be comfortable and is not complaining of any symptoms.  He denies any nausea, vomiting, diarrhea, cough, congestion, rhinorrhea.  Objective: Vitals:   09/02/17 1731 09/02/17 2206 09/02/17 2300 09/03/17 0811  BP: 124/68  (!) 126/52   Pulse: 71  77   Resp: (!) 22  20   Temp: 97.9 F (36.6 C)  98.2 F (36.8 C)   TempSrc: Oral  Oral   SpO2: 96%  91% 92%  Weight:      Height:  5\' 11"  (1.803 m)      Intake/Output Summary (Last 24 hours) at 09/03/2017 0914 Last data filed at 09/03/2017 0405 Gross per 24 hour  Intake 480 ml  Output 5950 ml  Net -5470 ml   Filed Weights   09/02/17 1200  Weight: 87.8 kg    Examination:  General exam: Appears calm and comfortable  Respiratory system: No increased work of breathing, diminished lung sounds at bases, crackles up to mid lung, no wheezes, rhonchi Cardiovascular system: Distant heart sounds, irregularly irregular, no murmurs Gastrointestinal system: Abdomen is nondistended, soft and nontender. No organomegaly or masses felt. Normal bowel sounds heard. Central nervous system: Alert and oriented. No focal neurological deficits. Extremities: Trace lower extremity edema Skin: No rashes over visible skin Psychiatry: Judgement and  insight appear normal. Mood & affect appropriate.     Data Reviewed: I have personally reviewed following labs and imaging studies  CBC: Recent Labs  Lab 09/01/17 2323 09/02/17 1159  WBC 13.7* 9.9    NEUTROABS  --  9.0*  HGB 15.0 15.3  HCT 47.3 46.8  MCV 94.4 92.3  PLT 229 616   Basic Metabolic Panel: Recent Labs  Lab 09/01/17 2323 09/02/17 0945 09/03/17 0313  NA 142  --  143  K 4.0  --  3.3*  CL 107  --  104  CO2 23  --  30  GLUCOSE 161*  --  129*  BUN 16  --  28*  CREATININE 1.18  --  1.31*  CALCIUM 9.2  --  9.4  MG  --  1.8  --    GFR: Estimated Creatinine Clearance: 39.9 mL/min (A) (by C-G formula based on SCr of 1.31 mg/dL (H)). Liver Function Tests: No results for input(s): AST, ALT, ALKPHOS, BILITOT, PROT, ALBUMIN in the last 168 hours. No results for input(s): LIPASE, AMYLASE in the last 168 hours. No results for input(s): AMMONIA in the last 168 hours. Coagulation Profile: No results for input(s): INR, PROTIME in the last 168 hours. Cardiac Enzymes: Recent Labs  Lab 09/02/17 0945 09/02/17 1535 09/02/17 2205  TROPONINI <0.03 <0.03 <0.03   BNP (last 3 results) No results for input(s): PROBNP in the last 8760 hours. HbA1C: No results for input(s): HGBA1C in the last 72 hours. CBG: No results for input(s): GLUCAP in the last 168 hours. Lipid Profile: No results for input(s): CHOL, HDL, LDLCALC, TRIG, CHOLHDL, LDLDIRECT in the last 72 hours. Thyroid Function Tests: Recent Labs    09/02/17 1159  TSH 0.626  FREET4 1.34   Anemia Panel: No results for input(s): VITAMINB12, FOLATE, FERRITIN, TIBC, IRON, RETICCTPCT in the last 72 hours. Sepsis Labs: Recent Labs  Lab 09/02/17 1535 09/03/17 0313  PROCALCITON 0.32 0.31    No results found for this or any previous visit (from the past 240 hour(s)).       Radiology Studies: Dg Chest Portable 1 View  Result Date: 09/01/2017 CLINICAL DATA:  Dyspnea EXAM: PORTABLE CHEST 1 VIEW COMPARISON:  07/29/2017 FINDINGS: Cardiomegaly is noted with aortic atherosclerosis. New bilateral pleural effusions with perihilar increase in interstitial and vascular markings compatible with moderate-to-marked pulmonary  edema. Superimposed pneumonia would be difficult to entirely exclude but findings are believed more likely from pulmonary edema with layering effusions. No acute osseous abnormality. IMPRESSION: Cardiomegaly with aortic atherosclerosis. Interval development of diffuse pulmonary edema with layering bilateral pleural effusions. Electronically Signed   By: Ashley Royalty M.D.   On: 09/01/2017 23:46        Scheduled Meds: . apixaban  5 mg Oral BID  . diltiazem  240 mg Oral Daily  . finasteride  5 mg Oral Daily  . furosemide  60 mg Intravenous Q12H  . isosorbide mononitrate  30 mg Oral Daily  . metoprolol tartrate  100 mg Oral BID  . pantoprazole  40 mg Oral Daily  . potassium chloride  40 mEq Oral Daily  . predniSONE  40 mg Oral Q breakfast  . sodium chloride flush  3 mL Intravenous Q12H  . tamsulosin  0.4 mg Oral QPC supper  . tiotropium  18 mcg Inhalation Daily   Continuous Infusions: . sodium chloride       LOS: 1 day    Time spent: [place], MD [facility]  If 7PM-7AM, please contact night-coverage [email or web address] Password [facility] 09/03/2017, 9:14 AM

## 2017-09-03 NOTE — Progress Notes (Signed)
Patient's daughters Lorn Junes and [name] requested information regarding Palliative Care and Advanced Directives.  [facility] Advanced Directives packets given to them and discussed Palliative Care purpose for symptom management.  Both daughters agreed they did not want their father to be intubated but they feel their father is fearful of discussing what measures he wants made.  Time spent answering questions and they are agreeable to review the information and they may be willing to speak with staff within then next few days and discuss any further decisions.

## 2017-09-04 DIAGNOSIS — J9601 Acute respiratory failure with hypoxia: Secondary | ICD-10-CM

## 2017-09-04 DIAGNOSIS — E7800 Pure hypercholesterolemia, unspecified: Secondary | ICD-10-CM

## 2017-09-04 DIAGNOSIS — I25118 Atherosclerotic heart disease of native coronary artery with other forms of angina pectoris: Secondary | ICD-10-CM

## 2017-09-04 DIAGNOSIS — I10 Essential (primary) hypertension: Secondary | ICD-10-CM

## 2017-09-04 DIAGNOSIS — J449 Chronic obstructive pulmonary disease, unspecified: Secondary | ICD-10-CM

## 2017-09-04 DIAGNOSIS — I5033 Acute on chronic diastolic (congestive) heart failure: Secondary | ICD-10-CM

## 2017-09-04 DIAGNOSIS — I480 Paroxysmal atrial fibrillation: Secondary | ICD-10-CM

## 2017-09-04 LAB — PROCALCITONIN: PROCALCITONIN: 0.19 ng/mL

## 2017-09-04 LAB — BASIC METABOLIC PANEL
Anion gap: 10 (ref 5–15)
Chloride: 100 mmol/L (ref 98–111)
Creatinine, Ser: 1.34 mg/dL — ABNORMAL HIGH (ref 0.61–1.24)
GFR calc non Af Amer: 45 mL/min — ABNORMAL LOW (ref >60)
Glucose, Bld: 130 mg/dL — ABNORMAL HIGH (ref 70–99)
Potassium: 3.3 mmol/L — ABNORMAL LOW (ref 3.5–5.1)
Sodium: 141 mmol/L (ref 135–145)

## 2017-09-04 LAB — CBC
HCT: 42.7 % (ref 39.0–52.0)
Hemoglobin: 13.8 g/dL (ref 13.0–17.0)
MCH: 30.3 pg (ref 26.0–34.0)
MCHC: 32.3 g/dL (ref 30.0–36.0)
MCV: 93.6 fL (ref 78.0–100.0)
Platelets: 239 10*3/uL (ref 150–400)
RBC: 4.56 MIL/uL (ref 4.22–5.81)
RDW: 14.8 % (ref 11.5–15.5)
WBC: 13.8 10*3/uL — ABNORMAL HIGH (ref 4.0–10.5)

## 2017-09-04 LAB — BASIC METABOLIC PANEL WITH GFR
BUN: 36 mg/dL — ABNORMAL HIGH (ref 8–23)
CO2: 31 mmol/L (ref 22–32)
Calcium: 9.2 mg/dL (ref 8.9–10.3)
GFR calc Af Amer: 52 mL/min — ABNORMAL LOW (ref >60)

## 2017-09-04 LAB — MAGNESIUM: Magnesium: 2 mg/dL (ref 1.7–2.4)

## 2017-09-04 MED ORDER — POTASSIUM CHLORIDE CRYS ER 20 MEQ PO TBCR
40.0000 meq | EXTENDED_RELEASE_TABLET | Freq: Once | ORAL | Status: AC
  Administered 2017-09-04: 40 meq via ORAL
  Filled 2017-09-04: qty 2

## 2017-09-04 MED ORDER — FUROSEMIDE 10 MG/ML IJ SOLN
60.0000 mg | Freq: Every day | INTRAMUSCULAR | Status: DC
  Administered 2017-09-04: 60 mg via INTRAVENOUS
  Filled 2017-09-04: qty 6

## 2017-09-04 MED ORDER — FUROSEMIDE 80 MG PO TABS
80.0000 mg | ORAL_TABLET | Freq: Every day | ORAL | Status: DC
  Administered 2017-09-05: 80 mg via ORAL
  Filled 2017-09-04: qty 1

## 2017-09-04 MED ORDER — TIOTROPIUM BROMIDE MONOHYDRATE 18 MCG IN CAPS
18.0000 ug | ORAL_CAPSULE | Freq: Every day | RESPIRATORY_TRACT | Status: DC
  Administered 2017-09-05: 18 ug via RESPIRATORY_TRACT
  Filled 2017-09-04: qty 5

## 2017-09-04 NOTE — Progress Notes (Signed)
Progress Note  Patient Name: [name] [name] Date of Encounter: 09/04/2017  Primary Cardiologist: [name], MD   Subjective   He reports feeling better.  He is lying fully supine in bed and appears quite comfortable, albeit with a slightly fast respiratory rate.  Denies pleuritic or anginal chest pain. Continues to have excellent urine output.  Net -8.8 L since admission. Weight is down roughly 2.5 pounds in the last 48 hours.  Current weight is 85.1 kg, which is actually much lower than 93 kg when I saw him in the office in March.  Inpatient Medications    Scheduled Meds: . apixaban  5 mg Oral BID  . diltiazem  240 mg Oral Daily  . finasteride  5 mg Oral Daily  . furosemide  60 mg Intravenous Daily  . isosorbide mononitrate  30 mg Oral Daily  . metoprolol tartrate  100 mg Oral BID  . pantoprazole  40 mg Oral Daily  . potassium chloride  40 mEq Oral Daily  . sodium chloride flush  3 mL Intravenous Q12H  . tamsulosin  0.4 mg Oral QPC supper  . [START ON 09/05/2017] tiotropium  18 mcg Inhalation Daily   Continuous Infusions: . sodium chloride     PRN Meds: sodium chloride, acetaminophen, levalbuterol, ondansetron (ZOFRAN) IV, sodium chloride flush   Vital Signs    Vitals:   09/03/17 2337 09/04/17 0500 09/04/17 0730 09/04/17 0826  BP: 128/60   (!) 149/76  Pulse: 62   (!) 52  Resp: 20   19  Temp: 97.7 F (36.5 C)   97.7 F (36.5 C)  TempSrc: Oral   Oral  SpO2: 95%  96% 93%  Weight:  85.1 kg    Height:        Intake/Output Summary (Last 24 hours) at 09/04/2017 1140 Last data filed at 09/04/2017 0500 Gross per 24 hour  Intake -  Output 2500 ml  Net -2500 ml   Filed Weights   09/02/17 1200 09/04/17 0500  Weight: 87.8 kg 85.1 kg    Telemetry    Mostly sinus rhythm with very frequent PACs and bursts of supraventricular tachycardia, some atrial fibrillation with controlled ventricular rate- Personally Reviewed  ECG    `Tracing from August 22 shows sinus  rhythm with right bundle branch block - Personally Reviewed  Physical Exam  Lying supine in bed looks fairly comfortable but with respiratory rate around 24 GEN: No acute distress.   Neck:  Roughly 6 cm JVD Cardiac: RRR, no murmurs, rubs, or gallops.  Respiratory: Clear to auscultation bilaterally. GI: Soft, nontender, non-distended  MS: No edema; No deformity. Neuro:  Nonfocal  Psych: Normal affect   Labs    Chemistry Recent Labs  Lab 09/01/17 2323 09/03/17 0313 09/04/17 0307  NA 142 143 141  K 4.0 3.3* 3.3*  CL 107 104 100  CO2 23 30 31   GLUCOSE 161* 129* 130*  BUN 16 28* 36*  CREATININE 1.18 1.31* 1.34*  CALCIUM 9.2 9.4 9.2  GFRNONAA 52* 46* 45*  GFRAA >60 54* 52*  ANIONGAP 12 9 10      Hematology Recent Labs  Lab 09/01/17 2323 09/02/17 1159 09/04/17 0307  WBC 13.7* 9.9 13.8*  RBC 5.01 5.07 4.56  HGB 15.0 15.3 13.8  HCT 47.3 46.8 42.7  MCV 94.4 92.3 93.6  MCH 29.9 30.2 30.3  MCHC 31.7 32.7 32.3  RDW 14.6 14.5 14.8  PLT 229 228 239    Cardiac Enzymes Recent Labs  Lab 09/02/17 0945  09/02/17 1535 09/02/17 2205  TROPONINI <0.03 <0.03 <0.03    Recent Labs  Lab 09/01/17 2330  TROPIPOC 0.02     BNP Recent Labs  Lab 09/02/17 1159  BNP 553.4*     DDimer No results for input(s): DDIMER in the last 168 hours.   Radiology    No results found.  Cardiac Studies   Study Conclusions  - Left ventricle: Systolic function was normal. The estimated ejection fraction was in the range of 60% to 65%. Wall motion was normal; there were no regional wall motion abnormalities. Doppler parameters are consistent with indeterminate mean left atrial filling pressure. - Aortic valve: There was trivial regurgitation. - Mitral valve: Valve area by pressure half-time: 2.1 cm^2. - Left atrium: The atrium was mildly dilated. - Pulmonary arteries: PA peak pressure: 33 mm Hg (S). - Pericardium, extracardiac: A trivial pericardial effusion  was identified.  ECHO: 04/19/2017 - Left ventricle: The cavity size was normal. Wall thickness was increased in a pattern of mild LVH. There was mild focal basal hypertrophy of the septum. Systolic function was normal. The estimated ejection fraction was in the range of 50% to 55%. Wall motion was normal; there were no regional wall motion abnormalities. - Aortic valve: There was trivial regurgitation. - Ascending aorta: The ascending aorta was mildly dilated. - Mitral valve: There was mild regurgitation. - Left atrium: The atrium was severely dilated. - Pulmonary arteries: PA peak pressure: 33 mm Hg (S).  CATH:05/08/2014 ANGIOGRAPHY:  IMPRESSION:  Preserved global LV contractility with mild distal anterolateral hypocontractility an ejection fraction of at least 55%.  Multivessel CAD with old total occlusion of the proximal LAD with left to left and right to left collaterals, mild 20% proximal circumflex and 30% mid OM stenosis; and 30% proximal RCA stenosis with a 95% distal RCA stenosis proximal to the PDA takeoff with jeopardized collaterals to the LAD.  Successful PCI with PTCA/DES stenting with insertion of a 3.518 mm Xience Alpine DES stent postdilated 3.7 mm the 95% RCA stenosis being reduced to 0%.  RECOMMENDATION:  The patient will continue with dual antiplatelet therapy for minimum of one-year. Medical therapy will be continued for his concomitant CAD with previously documented old total LAD occlusion with collateralization. The collaterals are now improved following PCI to the RCA.   Patient Profile     84 y.o. male with known CAD, chronic LAD occlusion, DES-RCA 2016 (fills LAD via collaterals), recurrent persistent atrial fibrillation, preserved left ventricular systolic function, hypertension, hyperlipidemia, aneurysm of the descending thoracic aorta, admitted with acute on chronic diastolic heart failure.  Assessment & Plan    1. CHF:  Substantially improved, excellent diuresis.  Acute respiratory failure has resolved.  He is already beneath his previously established "dry weight", which obviously is no longer valid.  It seems he has lost true weight.  Creatinine and especially BUN are increasing.  Although he still has some jugular venous distention he is lying flat in bed.  Will switch to oral diuretics.  Monitor for at least 24 hours 2. CAD: He is not having angina pectoris and there is no wall motion abnormality.  EF is better than before.  No reason to believe his decompensation was due to an acute coronary event.  Cardiac enzymes are normal.  On statin.  Not on aspirin due to full anticoagulation 3. AFib: Extremely frequent PACs, bursts of atrial tachycardia, but not in atrial fibrillation at this time.  CHADSVasc 5 (age 24, HTN, CAD, CHF). Continue anticoagulation.  Frequent ectopy by correction of hypokalemia 4. HTN: Blood pressure control is excellent.  Note previous problems with orthostatic hypotension and tendency to have a very low diastolic blood pressure when he is upright. 5. WBC elevation: Elevated on admission, then improved and now WBC back up to 13.8K.  Concern for aspiration versus effect of steroids administered on admission.  He remains afebrile   For questions or updates, please contact [place] Please consult [email or web address] for contact info under Cardiology/STEMI.      Signed, [name], MD  09/04/2017, 11:40 AM

## 2017-09-04 NOTE — Progress Notes (Signed)
PROGRESS NOTE    [name] [name]  GBT:[id number] DOB: 1925/12/21 DOA: 09/01/2017 PCP: [name], MD    Brief Narrative: 84 year old with past medical history relevant for hypertension, hyperlipidemia, chronic atrial fibrillation on apixaban, coronary artery disease status post stent to LAD in 7106, chronic diastolic heart failure by echo on 07/11/2017, BPH, COPD who presented with acute hypoxic respiratory failure in the setting of likely acute on chronic diastolic heart failure exacerbation.   Assessment & Plan:   Principal Problem:   Pulmonary edema Active Problems:   Essential hypertension   Hyperthyroidism, subclinical   Coronary artery disease of native artery of native heart with stable angina pectoris (HCC)   Hypercholesterolemia   Coronary artery disease due to lipid rich plaque   Acute on chronic diastolic congestive heart failure (HCC)   Atrial fibrillation, chronic (HCC)   COPD (chronic obstructive pulmonary disease) (HCC)   Respiratory failure (HCC)   #) Acute on chronic diastolic heart failure exacerbation: Improving.  Patient is currently on 2 L.  His creatinine is increased slightly. -Cardiology consult, appreciate recommendations - Echo done on 09/03/2016 shows normal EF - Hold home furosemide 40 mg - Decrease to IV furosemide 60 mg daily -Strict ins and outs, sodium restricted diet, weigh daily -Telemetry  #) Coronary artery disease status post stent: - Continue isosorbide mononitrate 30 mg daily -Continue metoprolol tartrate 100 mg twice daily  #) Chronic atrial fibrillation: - Continue apixaban 5 mg twice daily -Continue diltiazem 240 mg daily -Continue beta-blocker  #) BPH: -Continue finasteride 5 mg daily -Continue tamsulosin 0.4 mg nightly  #) Hypertension/hyperlipidemia: -Continue beta-blocker and calcium channel blocker -Continue long-acting nitrate  #) COPD: -Continue PRN bronchodilators -Discontinue oral prednisone  Fluids:  Restricted Elect lites: Monitor and supplement Nutrition: Heart healthy diet  Prophylaxis: On apixaban  Disposition: Pending improved respiratory status  Full code    Consultants:   Cardiology  Procedures:   09/03/2017 echo: Pending  Antimicrobials: (specify start and planned stop date. Auto populated tables are space occupying and do not give end dates)  None   Subjective: Patient is awake today.  He does not remember this Probation officer for the conversation he had yesterday.  He reports that he is clinically much improved he denies any nausea, vomiting, diarrhea, cough, congestion, rhinorrhea.  Objective: Vitals:   09/03/17 2337 09/04/17 0500 09/04/17 0730 09/04/17 0826  BP: 128/60   (!) 149/76  Pulse: 62   (!) 52  Resp: 20   19  Temp: 97.7 F (36.5 C)   97.7 F (36.5 C)  TempSrc: Oral   Oral  SpO2: 95%  96% 93%  Weight:  85.1 kg    Height:        Intake/Output Summary (Last 24 hours) at 09/04/2017 2694 Last data filed at 09/04/2017 0500 Gross per 24 hour  Intake -  Output 2500 ml  Net -2500 ml   Filed Weights   09/02/17 1200 09/04/17 0500  Weight: 87.8 kg 85.1 kg    Examination:  General exam: Appears calm and comfortable  Respiratory system: No increased work of breathing, diminished lung sounds at bases, no crackles, no wheezes, rhonchi Cardiovascular system: Distant heart sounds, irregularly irregular, no murmurs Gastrointestinal system: Abdomen is nondistended, soft and nontender. No organomegaly or masses felt. Normal bowel sounds heard. Central nervous system: Alert and oriented. No focal neurological deficits. Extremities: Trace lower extremity edema Skin: No rashes over visible skin Psychiatry: Judgement and insight appear normal. Mood & affect appropriate.     Data  Reviewed: I have personally reviewed following labs and imaging studies  CBC: Recent Labs  Lab 09/01/17 2323 09/02/17 1159 09/04/17 0307  WBC 13.7* 9.9 13.8*  NEUTROABS  --   9.0*  --   HGB 15.0 15.3 13.8  HCT 47.3 46.8 42.7  MCV 94.4 92.3 93.6  PLT 229 228 458   Basic Metabolic Panel: Recent Labs  Lab 09/01/17 2323 09/02/17 0945 09/03/17 0313 09/04/17 0307  NA 142  --  143 141  K 4.0  --  3.3* 3.3*  CL 107  --  104 100  CO2 23  --  30 31  GLUCOSE 161*  --  129* 130*  BUN 16  --  28* 36*  CREATININE 1.18  --  1.31* 1.34*  CALCIUM 9.2  --  9.4 9.2  MG  --  1.8  --  2.0   GFR: Estimated Creatinine Clearance: 39 mL/min (A) (by C-G formula based on SCr of 1.34 mg/dL (H)). Liver Function Tests: No results for input(s): AST, ALT, ALKPHOS, BILITOT, PROT, ALBUMIN in the last 168 hours. No results for input(s): LIPASE, AMYLASE in the last 168 hours. No results for input(s): AMMONIA in the last 168 hours. Coagulation Profile: No results for input(s): INR, PROTIME in the last 168 hours. Cardiac Enzymes: Recent Labs  Lab 09/02/17 0945 09/02/17 1535 09/02/17 2205  TROPONINI <0.03 <0.03 <0.03   BNP (last 3 results) No results for input(s): PROBNP in the last 8760 hours. HbA1C: No results for input(s): HGBA1C in the last 72 hours. CBG: No results for input(s): GLUCAP in the last 168 hours. Lipid Profile: No results for input(s): CHOL, HDL, LDLCALC, TRIG, CHOLHDL, LDLDIRECT in the last 72 hours. Thyroid Function Tests: Recent Labs    09/02/17 1159  TSH 0.626  FREET4 1.34   Anemia Panel: No results for input(s): VITAMINB12, FOLATE, FERRITIN, TIBC, IRON, RETICCTPCT in the last 72 hours. Sepsis Labs: Recent Labs  Lab 09/02/17 1535 09/03/17 0313 09/04/17 0307  PROCALCITON 0.32 0.31 0.19    No results found for this or any previous visit (from the past 240 hour(s)).       Radiology Studies: No results found.      Scheduled Meds: . apixaban  5 mg Oral BID  . diltiazem  240 mg Oral Daily  . finasteride  5 mg Oral Daily  . furosemide  60 mg Intravenous Daily  . isosorbide mononitrate  30 mg Oral Daily  . metoprolol tartrate  100  mg Oral BID  . pantoprazole  40 mg Oral Daily  . potassium chloride  40 mEq Oral Daily  . sodium chloride flush  3 mL Intravenous Q12H  . tamsulosin  0.4 mg Oral QPC supper  . tiotropium  18 mcg Inhalation Daily   Continuous Infusions: . sodium chloride       LOS: 2 days    Time spent: [place], MD [facility]  If 7PM-7AM, please contact night-coverage [email or web address] Password [facility] 09/04/2017, 9:56 AM

## 2017-09-05 ENCOUNTER — Inpatient Hospital Stay (HOSPITAL_COMMUNITY): Payer: Medicare Other

## 2017-09-05 DIAGNOSIS — I5030 Unspecified diastolic (congestive) heart failure: Secondary | ICD-10-CM | POA: Diagnosis not present

## 2017-09-05 DIAGNOSIS — R498 Other voice and resonance disorders: Secondary | ICD-10-CM | POA: Diagnosis not present

## 2017-09-05 DIAGNOSIS — J810 Acute pulmonary edema: Secondary | ICD-10-CM | POA: Diagnosis not present

## 2017-09-05 DIAGNOSIS — F309 Manic episode, unspecified: Secondary | ICD-10-CM | POA: Diagnosis not present

## 2017-09-05 DIAGNOSIS — L821 Other seborrheic keratosis: Secondary | ICD-10-CM | POA: Diagnosis not present

## 2017-09-05 DIAGNOSIS — E039 Hypothyroidism, unspecified: Secondary | ICD-10-CM | POA: Diagnosis not present

## 2017-09-05 DIAGNOSIS — R278 Other lack of coordination: Secondary | ICD-10-CM | POA: Diagnosis not present

## 2017-09-05 DIAGNOSIS — G629 Polyneuropathy, unspecified: Secondary | ICD-10-CM | POA: Diagnosis not present

## 2017-09-05 DIAGNOSIS — I25118 Atherosclerotic heart disease of native coronary artery with other forms of angina pectoris: Secondary | ICD-10-CM | POA: Diagnosis not present

## 2017-09-05 DIAGNOSIS — J449 Chronic obstructive pulmonary disease, unspecified: Secondary | ICD-10-CM | POA: Diagnosis not present

## 2017-09-05 DIAGNOSIS — G609 Hereditary and idiopathic neuropathy, unspecified: Secondary | ICD-10-CM | POA: Diagnosis not present

## 2017-09-05 DIAGNOSIS — Z955 Presence of coronary angioplasty implant and graft: Secondary | ICD-10-CM | POA: Diagnosis not present

## 2017-09-05 DIAGNOSIS — R4182 Altered mental status, unspecified: Secondary | ICD-10-CM | POA: Diagnosis not present

## 2017-09-05 DIAGNOSIS — I480 Paroxysmal atrial fibrillation: Secondary | ICD-10-CM | POA: Diagnosis not present

## 2017-09-05 DIAGNOSIS — R339 Retention of urine, unspecified: Secondary | ICD-10-CM | POA: Diagnosis not present

## 2017-09-05 DIAGNOSIS — Z9861 Coronary angioplasty status: Secondary | ICD-10-CM | POA: Diagnosis not present

## 2017-09-05 DIAGNOSIS — N320 Bladder-neck obstruction: Secondary | ICD-10-CM | POA: Diagnosis not present

## 2017-09-05 DIAGNOSIS — R29898 Other symptoms and signs involving the musculoskeletal system: Secondary | ICD-10-CM | POA: Diagnosis not present

## 2017-09-05 DIAGNOSIS — J9600 Acute respiratory failure, unspecified whether with hypoxia or hypercapnia: Secondary | ICD-10-CM | POA: Diagnosis not present

## 2017-09-05 DIAGNOSIS — I2510 Atherosclerotic heart disease of native coronary artery without angina pectoris: Secondary | ICD-10-CM | POA: Diagnosis not present

## 2017-09-05 DIAGNOSIS — Z85828 Personal history of other malignant neoplasm of skin: Secondary | ICD-10-CM | POA: Diagnosis not present

## 2017-09-05 DIAGNOSIS — E785 Hyperlipidemia, unspecified: Secondary | ICD-10-CM | POA: Diagnosis not present

## 2017-09-05 DIAGNOSIS — F0281 Dementia in other diseases classified elsewhere with behavioral disturbance: Secondary | ICD-10-CM | POA: Diagnosis not present

## 2017-09-05 DIAGNOSIS — Z743 Need for continuous supervision: Secondary | ICD-10-CM | POA: Diagnosis not present

## 2017-09-05 DIAGNOSIS — D649 Anemia, unspecified: Secondary | ICD-10-CM | POA: Diagnosis not present

## 2017-09-05 DIAGNOSIS — I714 Abdominal aortic aneurysm, without rupture: Secondary | ICD-10-CM | POA: Diagnosis not present

## 2017-09-05 DIAGNOSIS — I110 Hypertensive heart disease with heart failure: Secondary | ICD-10-CM | POA: Diagnosis not present

## 2017-09-05 DIAGNOSIS — I5032 Chronic diastolic (congestive) heart failure: Secondary | ICD-10-CM | POA: Diagnosis not present

## 2017-09-05 DIAGNOSIS — E538 Deficiency of other specified B group vitamins: Secondary | ICD-10-CM | POA: Diagnosis not present

## 2017-09-05 DIAGNOSIS — Z7901 Long term (current) use of anticoagulants: Secondary | ICD-10-CM | POA: Diagnosis not present

## 2017-09-05 DIAGNOSIS — R279 Unspecified lack of coordination: Secondary | ICD-10-CM | POA: Diagnosis not present

## 2017-09-05 DIAGNOSIS — I5033 Acute on chronic diastolic (congestive) heart failure: Secondary | ICD-10-CM | POA: Diagnosis not present

## 2017-09-05 DIAGNOSIS — R21 Rash and other nonspecific skin eruption: Secondary | ICD-10-CM | POA: Diagnosis not present

## 2017-09-05 DIAGNOSIS — R918 Other nonspecific abnormal finding of lung field: Secondary | ICD-10-CM | POA: Diagnosis not present

## 2017-09-05 DIAGNOSIS — F329 Major depressive disorder, single episode, unspecified: Secondary | ICD-10-CM | POA: Diagnosis not present

## 2017-09-05 DIAGNOSIS — M6281 Muscle weakness (generalized): Secondary | ICD-10-CM | POA: Diagnosis not present

## 2017-09-05 DIAGNOSIS — J90 Pleural effusion, not elsewhere classified: Secondary | ICD-10-CM | POA: Diagnosis not present

## 2017-09-05 DIAGNOSIS — G9340 Encephalopathy, unspecified: Secondary | ICD-10-CM | POA: Diagnosis not present

## 2017-09-05 DIAGNOSIS — R413 Other amnesia: Secondary | ICD-10-CM | POA: Diagnosis not present

## 2017-09-05 DIAGNOSIS — N401 Enlarged prostate with lower urinary tract symptoms: Secondary | ICD-10-CM | POA: Diagnosis not present

## 2017-09-05 DIAGNOSIS — I10 Essential (primary) hypertension: Secondary | ICD-10-CM | POA: Diagnosis not present

## 2017-09-05 DIAGNOSIS — R41841 Cognitive communication deficit: Secondary | ICD-10-CM | POA: Diagnosis not present

## 2017-09-05 DIAGNOSIS — R2689 Other abnormalities of gait and mobility: Secondary | ICD-10-CM | POA: Diagnosis not present

## 2017-09-05 DIAGNOSIS — R1312 Dysphagia, oropharyngeal phase: Secondary | ICD-10-CM | POA: Diagnosis not present

## 2017-09-05 DIAGNOSIS — F319 Bipolar disorder, unspecified: Secondary | ICD-10-CM | POA: Diagnosis not present

## 2017-09-05 DIAGNOSIS — F4310 Post-traumatic stress disorder, unspecified: Secondary | ICD-10-CM | POA: Diagnosis not present

## 2017-09-05 DIAGNOSIS — L570 Actinic keratosis: Secondary | ICD-10-CM | POA: Diagnosis not present

## 2017-09-05 DIAGNOSIS — I201 Angina pectoris with documented spasm: Secondary | ICD-10-CM | POA: Diagnosis not present

## 2017-09-05 DIAGNOSIS — I5031 Acute diastolic (congestive) heart failure: Secondary | ICD-10-CM | POA: Diagnosis not present

## 2017-09-05 DIAGNOSIS — I200 Unstable angina: Secondary | ICD-10-CM | POA: Diagnosis not present

## 2017-09-05 LAB — BASIC METABOLIC PANEL
Anion gap: 8 (ref 5–15)
BUN: 33 mg/dL — ABNORMAL HIGH (ref 8–23)
CO2: 32 mmol/L (ref 22–32)
Creatinine, Ser: 1.26 mg/dL — ABNORMAL HIGH (ref 0.61–1.24)
GFR calc non Af Amer: 48 mL/min — ABNORMAL LOW (ref >60)
Glucose, Bld: 108 mg/dL — ABNORMAL HIGH (ref 70–99)
Potassium: 3.7 mmol/L (ref 3.5–5.1)

## 2017-09-05 LAB — BASIC METABOLIC PANEL WITH GFR
Calcium: 9 mg/dL (ref 8.9–10.3)
Chloride: 103 mmol/L (ref 98–111)
GFR calc Af Amer: 56 mL/min — ABNORMAL LOW (ref >60)
Sodium: 143 mmol/L (ref 135–145)

## 2017-09-05 MED ORDER — FUROSEMIDE 80 MG PO TABS: 80.0000 mg | ORAL_TABLET | Freq: Every day | ORAL | 1 refills | Status: DC

## 2017-09-05 NOTE — Progress Notes (Addendum)
Progress Note  Patient Name: [name] [name] Date of Encounter: 09/05/2017  Primary Cardiologist: [name], MD   Subjective   Breathing well but still on O2. Sits up unaided. Denies CP or SOB.   Inpatient Medications    Scheduled Meds: . apixaban  5 mg Oral BID  . diltiazem  240 mg Oral Daily  . finasteride  5 mg Oral Daily  . furosemide  80 mg Oral Daily  . isosorbide mononitrate  30 mg Oral Daily  . metoprolol tartrate  100 mg Oral BID  . pantoprazole  40 mg Oral Daily  . potassium chloride  40 mEq Oral Daily  . sodium chloride flush  3 mL Intravenous Q12H  . tamsulosin  0.4 mg Oral QPC supper  . tiotropium  18 mcg Inhalation Daily   Continuous Infusions: . sodium chloride     PRN Meds: sodium chloride, acetaminophen, levalbuterol, ondansetron (ZOFRAN) IV, sodium chloride flush   Vital Signs    Vitals:   09/05/17 0132 09/05/17 0719 09/05/17 0824 09/05/17 1000  BP: 125/60  (!) 142/60   Pulse: 61  65   Resp: 20  18   Temp: 98.3 F (36.8 C)  98.1 F (36.7 C)   TempSrc: Oral  Oral   SpO2: 96% 93% 96%   Weight:    84.3 kg  Height:        Intake/Output Summary (Last 24 hours) at 09/05/2017 1143 Last data filed at 09/05/2017 1000 Gross per 24 hour  Intake 200 ml  Output 3550 ml  Net -3350 ml   Filed Weights   09/02/17 1200 09/04/17 0500 09/05/17 1000  Weight: 87.8 kg 85.1 kg 84.3 kg    Telemetry    SR, PAC rate is decreasing- Personally Reviewed  ECG    `Tracing from August 22 shows sinus rhythm with right bundle branch block - Personally Reviewed  Physical Exam    GEN: No acute distress.  Lying in bed comfortably Neck:  minimal JVD Cardiac: RRR w/ no sig M/R/G  Respiratory: CTA bilaterally GI: +BS, soft, nontender MS: no edema, distal pulses intact, moves all extrem Neuro: no focal deficit Psych: good affect  Labs    Chemistry Recent Labs  Lab 09/03/17 0313 09/04/17 0307 09/05/17 0300  NA 143 141 143  K 3.3* 3.3* 3.7  CL 104 100  103  CO2 30 31 32  GLUCOSE 129* 130* 108*  BUN 28* 36* 33*  CREATININE 1.31* 1.34* 1.26*  CALCIUM 9.4 9.2 9.0  GFRNONAA 46* 45* 48*  GFRAA 54* 52* 56*  ANIONGAP 9 10 8      Hematology Recent Labs  Lab 09/01/17 2323 09/02/17 1159 09/04/17 0307  WBC 13.7* 9.9 13.8*  RBC 5.01 5.07 4.56  HGB 15.0 15.3 13.8  HCT 47.3 46.8 42.7  MCV 94.4 92.3 93.6  MCH 29.9 30.2 30.3  MCHC 31.7 32.7 32.3  RDW 14.6 14.5 14.8  PLT 229 228 239    Cardiac Enzymes Recent Labs  Lab 09/02/17 0945 09/02/17 1535 09/02/17 2205  TROPONINI <0.03 <0.03 <0.03    Recent Labs  Lab 09/01/17 2330  TROPIPOC 0.02     BNP Recent Labs  Lab 09/02/17 1159  BNP 553.4*     Radiology    Dg Chest Port 1 View  Result Date: 09/05/2017 CLINICAL DATA:  Hypoxia EXAM: PORTABLE CHEST 1 VIEW COMPARISON:  09/01/2017 chest radiograph. FINDINGS: Stable cardiomediastinal silhouette with mild cardiomegaly. No pneumothorax. Stable small bilateral pleural effusions. No overt pulmonary edema. Patchy bibasilar  lung opacities, stable. IMPRESSION: 1. Stable mild cardiomegaly without overt pulmonary edema. 2. Stable small bilateral pleural effusions and patchy bibasilar lung opacities. Electronically Signed   By: Ilona Sorrel M.D.   On: 09/05/2017 08:35    Cardiac Studies   ECHO: 09/03/2017 Study Conclusions  - Left ventricle: Systolic function was normal. The estimated ejection fraction was in the range of 60% to 65%. Wall motion was normal; there were no regional wall motion abnormalities. Doppler parameters are consistent with indeterminate mean left atrial filling pressure. - Aortic valve: There was trivial regurgitation. - Mitral valve: Valve area by pressure half-time: 2.1 cm^2. - Left atrium: The atrium was mildly dilated. - Pulmonary arteries: PA peak pressure: 33 mm Hg (S). - Pericardium, extracardiac: A trivial pericardial effusion was identified.  ECHO: 04/19/2017 - Left ventricle: The cavity  size was normal. Wall thickness was increased in a pattern of mild LVH. There was mild focal basal hypertrophy of the septum. Systolic function was normal. The estimated ejection fraction was in the range of 50% to 55%. Wall motion was normal; there were no regional wall motion abnormalities. - Aortic valve: There was trivial regurgitation. - Ascending aorta: The ascending aorta was mildly dilated. - Mitral valve: There was mild regurgitation. - Left atrium: The atrium was severely dilated. - Pulmonary arteries: PA peak pressure: 33 mm Hg (S).  CATH:05/08/2014 ANGIOGRAPHY:  IMPRESSION:  Preserved global LV contractility with mild distal anterolateral hypocontractility an ejection fraction of at least 55%.  Multivessel CAD with old total occlusion of the proximal LAD with left to left and right to left collaterals, mild 20% proximal circumflex and 30% mid OM stenosis; and 30% proximal RCA stenosis with a 95% distal RCA stenosis proximal to the PDA takeoff with jeopardized collaterals to the LAD.  Successful PCI with PTCA/DES stenting with insertion of a 3.518 mm Xience Alpine DES stent postdilated 3.7 mm the 95% RCA stenosis being reduced to 0%.  RECOMMENDATION:  The patient will continue with dual antiplatelet therapy for minimum of one-year. Medical therapy will be continued for his concomitant CAD with previously documented old total LAD occlusion with collateralization. The collaterals are now improved following PCI to the RCA.   Patient Profile     84 y.o. male with known CAD, chronic LAD occlusion, DES-RCA 2016 (fills LAD via collaterals), recurrent persistent atrial fibrillation, preserved left ventricular systolic function, hypertension, hyperlipidemia, aneurysm of the descending thoracic aorta, admitted with acute on chronic diastolic heart failure.  Assessment & Plan    1. CHF:  -Feel he has had his dry weight pounds. - Continue Lasix at 80 mg  daily. - Daily weights once he gets to the facility as well as 2000 mg sodium diet and limiting all fluids to less than 2 L daily are also important. -BUN/creatinine had been increasing, but are trending back down now that he is on oral diuretics  2. CAD:  - No clearly ischemic symptoms, cardiac enzymes are negative -EF is normal by echo with no wall motion abnormalities -No aspirin due to full anticoagulation, continue statin, nitrates and beta-blocker  3. AFib:  -During this admission he has had PACs and atrial tach but not atrial fibrillation. -Chads VASC score is 5, continue Eliquis -Ectopy decreased once hypokalemia was corrected  4. HTN:  -Good blood pressure control on current meds -Watch for orthostatic hypotension   5. WBC elevation:  -13.7 on admission the 9.9 then 13.8.  The last elevation may be secondary to steroids -Afebrile, slight nonproductive  cough -Watch for increased symptoms  Plan: DC today per IM, will arrange office follow-up  For questions or updates, please contact [place] Please consult [email or web address] for contact info under Cardiology/STEMI.      Signed, [name], PA-C  09/05/2017, 11:43 AM    I have seen and examined the patient along with [name], PA-C .  I have reviewed the chart, notes and new data.  I agree with PA's note.  Key new complaints: He feels well and is fully alert Key examination changes: Appears comfortable lying supine in bed, regular rate and rhythm, no physical findings to suggest hypervolemia.  Weight has dropped by another couple of pounds. Key new findings / data: Despite diuresis and additional reduction in weight his creatinine and BUN are slightly improved.  PLAN: Plan to discharge home on furosemide 80 mg daily, twice his previous dose.  He is at risk both for congestive heart failure and hypovolemia/renal insufficiency.  It will be very important for him to have a reliable daily weight monitoring, and he  should report weight loss or gain in excess of 3 pounds from his discharge weight.  Hospital discharge weight is 84.3 kg (186 pounds) but this may be substantially different on the scale at [facility].  Should weigh as soon as he gets there and first in the morning.  Plan to recheck labs next week.  Arrange early office follow-up.  Per family's request we will go ahead with an outpatient palliative care consultation.  [name], MD, [place] [phone] 09/05/2017, 2:08 PM

## 2017-09-05 NOTE — Progress Notes (Signed)
Patient will discharge to [facility] Anticipated discharge date: 8/25 Family notified: at bedside Transportation by [facility]- scheduled at 1:30pm  CSW signing off.  Jorge Ny, LCSW Clinical Social Worker [phone]

## 2017-09-05 NOTE — Discharge Summary (Signed)
Physician Discharge Summary  [name] [name] WUJ:[id number] DOB: 1925/07/06 DOA: 09/01/2017  PCP: [name], MD  Admit date: 09/01/2017 Discharge date: 09/05/2017  Admitted From: Skilled nursing facility Disposition: Skilled nursing facility  Recommendations for Outpatient Follow-up:  1. Follow up with PCP in 1-2 weeks 2. Please obtain BMP/CBC in one week 3. Please take an extra dose of your water pill twice a day if you notice any weight gain of 4 pounds or more.  Home Health: No Equipment/Devices: 2 L nasal cannula  Discharge Condition: Stable CODE STATUS: Full Diet recommendation: Heart Healthy, 2 g sodium restricted, 1.5 L fluid restriction  Brief/Interim Summary:  #) Acute on chronic diastolic heart failure: Patient was admitted with acute hypoxic respiratory failure and bilateral pleural effusions and pulmonary edema.  He was on BiPAP and rapidly weaned to 2 L which he intermittently uses at his nursing home.  He was given aggressive IV diuresis with furosemide.  His echo showed a normal ejection fraction.  Cardiology was consulted and recommended continue diuresis.  His home oral furosemide was held.  On discharge he was sent home with 80 mg furosemide twice a day with instructions to increase if his weight increased 3 had any evidence of swelling or worsening heart failure.  He should be on a sodium restricted and fluid restricted diet per above.  #) coronary artery disease status post stent: Patient was continued on beta-blocker and isosorbide mononitrate.  #)chronic atrial for ablation: Patient was continued on beta-blocker, diltiazem, apixaban.  #) BPH: Patient was continued on finasteride and tamsulosin.  #) Hypertension/hyperlipidemia: Patient was continued on beta-blocker and calcium channel blocker.  Patient was interlocking nitrate.  #) COPD: Patient was continued on PRN bronchodilators.  He initially was started on IV steroids however he was not wheezing and so  these were discontinued as he has significant agitation with steroids.   Discharge Diagnoses:  Principal Problem:   Pulmonary edema Active Problems:   Essential hypertension   Hyperthyroidism, subclinical   Coronary artery disease of native artery of native heart with stable angina pectoris (HCC)   Hypercholesterolemia   Coronary artery disease due to lipid rich plaque   Acute on chronic diastolic congestive heart failure (HCC)   Atrial fibrillation, chronic (HCC)   COPD (chronic obstructive pulmonary disease) (HCC)   Respiratory failure (HCC)   Paroxysmal atrial fibrillation [facility])    Discharge Instructions  Discharge Instructions    (HEART FAILURE PATIENTS) Call MD:  Anytime you have any of the following symptoms: 1) 3 pound weight gain in 24 hours or 5 pounds in 1 week 2) shortness of breath, with or without a dry hacking cough 3) swelling in the hands, feet or stomach 4) if you have to sleep on extra pillows at night in order to breathe.   Complete by:  As directed    Call MD for:  extreme fatigue   Complete by:  As directed    Call MD for:  hives   Complete by:  As directed    Call MD for:  persistant dizziness or light-headedness   Complete by:  As directed    Call MD for:  persistant nausea and vomiting   Complete by:  As directed    Call MD for:  severe uncontrolled pain   Complete by:  As directed    Call MD for:  temperature >100.4   Complete by:  As directed    Diet - low sodium heart healthy   Complete by:  As directed  Discharge instructions   Complete by:  As directed    Please follow-up with your primary care doctor in 1 to 2 weeks.  Please take an extra dose of your water pill twice a day for 2 or 3 days if you notice a weight gain of more than 4 pounds or have any trouble breathing or increased swelling.   Increase activity slowly   Complete by:  As directed      Allergies as of 09/05/2017      Reactions   Cortisone Other (See Comments)   Causes  emotional problems       Medication List    STOP taking these medications   pantoprazole 40 MG tablet Commonly known as:  PROTONIX   QUEtiapine 25 MG tablet Commonly known as:  SEROQUEL     TAKE these medications   acetaminophen 500 MG tablet Commonly known as:  TYLENOL Take 500-1,000 mg by mouth every 6 (six) hours as needed for mild pain.   apixaban 5 MG Tabs tablet Commonly known as:  ELIQUIS Take 1 tablet (5 mg total) by mouth 2 (two) times daily.   B-12 COMPLIANCE INJECTION 1000 MCG/ML Kit Generic drug:  Cyanocobalamin Inject 1,000 mcg as directed See admin instructions. On the 20th of every month   Carboxymethylcellulose Sod PF 1 % Gel Place 1 drop into both eyes 2 (two) times daily.   DERMAPHOR Oint Apply 1 application topically 3 (three) times daily. To penis   dextromethorphan-guaiFENesin 30-600 MG 12hr tablet Commonly known as:  MUCINEX DM Take 2 tablets by mouth 2 (two) times daily.   diltiazem 240 MG 24 hr capsule Commonly known as:  CARDIZEM CD Take 1 capsule (240 mg total) by mouth daily.   finasteride 5 MG tablet Commonly known as:  PROSCAR Take 5 mg by mouth daily.   furosemide 80 MG tablet Commonly known as:  LASIX Take 1 tablet (80 mg total) by mouth daily. Start taking on:  09/06/2017 What changed:    medication strength  how much to take  when to take this   isosorbide mononitrate 30 MG 24 hr tablet Commonly known as:  IMDUR Take 30 mg by mouth daily.   levalbuterol 1.25 MG/0.5ML nebulizer solution Commonly known as:  XOPENEX Take 1.25 mg by nebulization 3 (three) times daily. What changed:    when to take this  Another medication with the same name was removed. Continue taking this medication, and follow the directions you see here.   lisinopril 20 MG tablet Commonly known as:  PRINIVIL,ZESTRIL Take 1 tablet (20 mg total) by mouth daily.   loratadine 10 MG tablet Commonly known as:  CLARITIN Take 1 tablet (10 mg total) by  mouth daily.   metoprolol tartrate 100 MG tablet Commonly known as:  LOPRESSOR Take 100 mg by mouth 2 (two) times daily.   nystatin powder Generic drug:  nystatin Apply topically 3 (three) times daily. Affected areas on groin and buttocks   polyethylene glycol packet Commonly known as:  MIRALAX / GLYCOLAX Take 17 g by mouth 2 (two) times daily.   PRESERVISION AREDS 2+MULTI VIT Caps Take 1 capsule by mouth 2 (two) times daily.   ranitidine 300 MG tablet Commonly known as:  ZANTAC Take 300 mg by mouth at bedtime.   tamsulosin 0.4 MG Caps capsule Commonly known as:  FLOMAX Take 1 capsule (0.4 mg total) by mouth daily after supper.   tiotropium 18 MCG inhalation capsule Commonly known as:  SPIRIVA Place 1 capsule (  18 mcg total) into inhaler and inhale daily.   traZODone 50 MG tablet Commonly known as:  DESYREL Take 50 mg by mouth at bedtime.   Vitamin D (Cholecalciferol) 1000 units Tabs Take 1,000 Units by mouth at bedtime.       Allergies  Allergen Reactions  . Cortisone Other (See Comments)    Causes emotional problems     Consultations:  Cardiology   Procedures/Studies: Dg Chest Port 1 View  Result Date: 09/05/2017 CLINICAL DATA:  Hypoxia EXAM: PORTABLE CHEST 1 VIEW COMPARISON:  09/01/2017 chest radiograph. FINDINGS: Stable cardiomediastinal silhouette with mild cardiomegaly. No pneumothorax. Stable small bilateral pleural effusions. No overt pulmonary edema. Patchy bibasilar lung opacities, stable. IMPRESSION: 1. Stable mild cardiomegaly without overt pulmonary edema. 2. Stable small bilateral pleural effusions and patchy bibasilar lung opacities. Electronically Signed   By: Ilona Sorrel M.D.   On: 09/05/2017 08:35   Dg Chest Portable 1 View  Result Date: 09/01/2017 CLINICAL DATA:  Dyspnea EXAM: PORTABLE CHEST 1 VIEW COMPARISON:  07/29/2017 FINDINGS: Cardiomegaly is noted with aortic atherosclerosis. New bilateral pleural effusions with perihilar increase in  interstitial and vascular markings compatible with moderate-to-marked pulmonary edema. Superimposed pneumonia would be difficult to entirely exclude but findings are believed more likely from pulmonary edema with layering effusions. No acute osseous abnormality. IMPRESSION: Cardiomegaly with aortic atherosclerosis. Interval development of diffuse pulmonary edema with layering bilateral pleural effusions. Electronically Signed   By: Ashley Royalty M.D.   On: 09/01/2017 23:46    Echo 09/03/2017:- Left ventricle: Systolic function was normal. The estimated   ejection fraction was in the range of 60% to 65%. Wall motion was   normal; there were no regional wall motion abnormalities. Doppler   parameters are consistent with indeterminate mean left atrial   filling pressure. - Aortic valve: There was trivial regurgitation. - Mitral valve: Valve area by pressure half-time: 2.1 cm^2. - Left atrium: The atrium was mildly dilated. - Pulmonary arteries: PA peak pressure: 33 mm Hg (S). - Pericardium, extracardiac: A trivial pericardial effusion was   identified.    Subjective:   Discharge Exam: Vitals:   09/05/17 0719 09/05/17 0824  BP:  (!) 142/60  Pulse:  65  Resp:  18  Temp:  98.1 F (36.7 C)  SpO2: 93% 96%   Vitals:   09/05/17 0132 09/05/17 0719 09/05/17 0824 09/05/17 1000  BP: 125/60  (!) 142/60   Pulse: 61  65   Resp: 20  18   Temp: 98.3 F (36.8 C)  98.1 F (36.7 C)   TempSrc: Oral  Oral   SpO2: 96% 93% 96%   Weight:    84.3 kg  Height:        General exam: Appears calm and comfortable  Respiratory system: No increased work of breathing, diminished lung sounds at bases, no crackles, no wheezes, rhonchi Cardiovascular system: Distant heart sounds, irregularly irregular, no murmurs Gastrointestinal system: Abdomen is nondistended, soft and nontender. No organomegaly or masses felt. Normal bowel sounds heard. Central nervous system: Alert and oriented. No focal neurological  deficits. Extremities: Trace lower extremity edema Skin: No rashes over visible skin Psychiatry: Judgement and insight appear normal. Mood & affect appropriate.     The results of significant diagnostics from this hospitalization (including imaging, microbiology, ancillary and laboratory) are listed below for reference.     Microbiology: No results found for this or any previous visit (from the past 240 hour(s)).   Labs: BNP (last 3 results) Recent Labs  07/09/17 1835 07/29/17 2030 09/02/17 1159  BNP 234.6* 157.2* 502.7*   Basic Metabolic Panel: Recent Labs  Lab 09/01/17 2323 09/02/17 0945 09/03/17 0313 09/04/17 0307 09/05/17 0300  NA 142  --  143 141 143  K 4.0  --  3.3* 3.3* 3.7  CL 107  --  104 100 103  CO2 23  --  30 31 32  GLUCOSE 161*  --  129* 130* 108*  BUN 16  --  28* 36* 33*  CREATININE 1.18  --  1.31* 1.34* 1.26*  CALCIUM 9.2  --  9.4 9.2 9.0  MG  --  1.8  --  2.0  --    Liver Function Tests: No results for input(s): AST, ALT, ALKPHOS, BILITOT, PROT, ALBUMIN in the last 168 hours. No results for input(s): LIPASE, AMYLASE in the last 168 hours. No results for input(s): AMMONIA in the last 168 hours. CBC: Recent Labs  Lab 09/01/17 2323 09/02/17 1159 09/04/17 0307  WBC 13.7* 9.9 13.8*  NEUTROABS  --  9.0*  --   HGB 15.0 15.3 13.8  HCT 47.3 46.8 42.7  MCV 94.4 92.3 93.6  PLT 229 228 239   Cardiac Enzymes: Recent Labs  Lab 09/02/17 0945 09/02/17 1535 09/02/17 2205  TROPONINI <0.03 <0.03 <0.03   BNP: Invalid input(s): POCBNP CBG: No results for input(s): GLUCAP in the last 168 hours. D-Dimer No results for input(s): DDIMER in the last 72 hours. Hgb A1c No results for input(s): HGBA1C in the last 72 hours. Lipid Profile No results for input(s): CHOL, HDL, LDLCALC, TRIG, CHOLHDL, LDLDIRECT in the last 72 hours. Thyroid function studies Recent Labs    09/02/17 1159  TSH 0.626   Anemia work up No results for input(s): VITAMINB12,  FOLATE, FERRITIN, TIBC, IRON, RETICCTPCT in the last 72 hours. Urinalysis    Component Value Date/Time   COLORURINE YELLOW 09/02/2017 0955   APPEARANCEUR CLOUDY (A) 09/02/2017 0955   LABSPEC 1.006 09/02/2017 0955   PHURINE 6.0 09/02/2017 0955   GLUCOSEU NEGATIVE 09/02/2017 0955   HGBUR LARGE (A) 09/02/2017 0955   BILIRUBINUR NEGATIVE 09/02/2017 0955   KETONESUR NEGATIVE 09/02/2017 0955   PROTEINUR 30 (A) 09/02/2017 0955   UROBILINOGEN 0.2 11/09/2014 1532   NITRITE POSITIVE (A) 09/02/2017 0955   LEUKOCYTESUR LARGE (A) 09/02/2017 0955   Sepsis Labs Invalid input(s): PROCALCITONIN,  WBC,  LACTICIDVEN Microbiology No results found for this or any previous visit (from the past 240 hour(s)).   Time coordinating discharge: 35  SIGNED:   Cristy Folks, MD  [facility] 09/05/2017, 10:20 AM   If 7PM-7AM, please contact night-coverage [email or web address] Password [email or web address]

## 2017-09-06 ENCOUNTER — Telehealth: Payer: Self-pay | Admitting: Physician Assistant

## 2017-09-06 NOTE — Telephone Encounter (Signed)
Daughter calling back stated that her dad was recently in the hospital. They advised him to be seen in 7-10 days after the visit, and the appointment was made 18 days when [name] [name] returned from vacation. Patient daughter advised that they did not mind to see someone else while she was out, I scheduled with [name] [name] next week. Patient daughter verbalized understanding of changed appointment.  Daughter is questioning why they changed his medication protonix to zantac? I looked in chart notes but did not find out why. Please advise,  Also they changed fluid restrictions from 2L to 1.5L and they wanted to make sure this was still okay to do. Currently at nursing center he is on 2L. She would like the okay from Dr.C on this.   Thank you!

## 2017-09-06 NOTE — Telephone Encounter (Signed)
New Message      Patient's daughter has questions about her father's medication.

## 2017-09-06 NOTE — Telephone Encounter (Signed)
Called patient daughter, LVM advising to call back regarding questions with medications.  Left call back number.

## 2017-09-07 DIAGNOSIS — R339 Retention of urine, unspecified: Secondary | ICD-10-CM | POA: Diagnosis not present

## 2017-09-07 NOTE — NC FL2 (Signed)
[place] LEVEL OF CARE SCREENING TOOL     IDENTIFICATION  Patient Name: [name] [name] Birthdate: [birth year] Sex: male Admission Date (Current Location): 09/01/2017  [facility] and [place] Number:  Herbalist and Address:  [facility] [place]. [facility], [place] [address], [place], [place] [address]      Provider Number: [id number]  Attending Physician Name and Address:  No att. providers found  Relative Name and Phone Number:  [name], daughter, [phone]    Current Level of Care: Hospital Recommended Level of Care: [place] Prior Approval Number:    Date Approved/Denied:   PASRR Number: [id number]  Discharge Plan: SNF    Current Diagnoses: Patient Active Problem List   Diagnosis Date Noted  . Paroxysmal atrial fibrillation (HCC)   . Pulmonary edema 09/02/2017  . Respiratory failure ([place]) 09/02/2017  . Major neurocognitive disorder due to another medical condition with behavioral disturbance 07/31/2017  . Chronic anticoagulation 07/30/2017  . COPD (chronic obstructive pulmonary disease) ([place]) 07/30/2017  . Acute encephalopathy 07/29/2017  . Pressure injury of skin 07/13/2017  . Atrial fibrillation with RVR ([place]) 07/09/2017  . Acute CHF (congestive heart failure) ([place]) 07/09/2017  . AAA (abdominal aortic aneurysm) ([place]) 07/09/2017  . Acute on chronic diastolic congestive heart failure ([place]) 04/11/2017  . Atrial fibrillation, chronic ([place]) 04/11/2017  . Acute respiratory failure with hypoxia ([place]) 02/23/2017  . CAP (community acquired pneumonia) 02/23/2017  . Nausea & vomiting 02/23/2017  . PTSD (post-traumatic stress disorder)   . Vomiting and diarrhea 11/09/2014  . Unstable angina ([place]) 05/10/2014  . Coronary artery disease due to lipid rich plaque   . Angina pectoris, crescendo ([place]) 04/28/2014  . Contusion of left hip 07/04/2013  . Fall 07/04/2013  . Benign prostatic hyperplasia 05/31/2013  . Coronary artery disease of  native artery of native heart with stable angina pectoris ([place]) 05/31/2013  . UTI (urinary tract infection) 05/31/2013  . Hypercholesterolemia 05/31/2013  . Dysphagia, pharyngoesophageal phase 04/10/2013  . Frequent falls 04/10/2013  . Weakness 04/10/2013  . Hyperthyroidism, subclinical 04/10/2013  . GERD (gastroesophageal reflux disease) 04/06/2013  . Bladder outflow obstruction 04/06/2013  . Dehydration 04/05/2013  . Essential hypertension 04/05/2013  . Arthritis 04/05/2013  . Peripheral neuropathy 04/05/2013  . Gastroenteritis, acute 04/05/2013  . Gastroenteritis 04/05/2013    Orientation RESPIRATION BLADDER Height & Weight     Self, Time, Situation, Place  O2(Nasal cannula 3L) Incontinent, Indwelling catheter Weight: 84.3 kg Height:  5' 11"  (180.3 cm)  BEHAVIORAL SYMPTOMS/MOOD NEUROLOGICAL BOWEL NUTRITION STATUS      Continent Diet(Please see DC Summary)  AMBULATORY STATUS COMMUNICATION OF NEEDS Skin   Extensive Assist Verbally Normal                       Personal Care Assistance Level of Assistance  Bathing, Feeding, Dressing Bathing Assistance: Maximum assistance Feeding assistance: Independent Dressing Assistance: Limited assistance     Functional Limitations Info  Sight, Hearing, Speech Sight Info: Adequate Hearing Info: Adequate Speech Info: Adequate    SPECIAL CARE FACTORS FREQUENCY  PT (By licensed PT)                    Contractures Contractures Info: Not present    Additional Factors Info  Code Status, Allergies Code Status Info: Full Allergies Info: Cortisone           Current Medications (09/07/2017):  This is the current hospital active medication list No current facility-administered medications  for this encounter.    Current Outpatient Medications  Medication Sig Dispense Refill  . acetaminophen (TYLENOL) 500 MG tablet Take 500-1,000 mg by mouth every 6 (six) hours as needed for mild pain.     [name] apixaban (ELIQUIS) 5 MG TABS  tablet Take 1 tablet (5 mg total) by mouth 2 (two) times daily. 180 tablet 3  . Carboxymethylcellulose Sod PF 1 % GEL Place 1 drop into both eyes 2 (two) times daily.     . Cyanocobalamin (B-12 COMPLIANCE INJECTION) 1000 MCG/ML KIT Inject 1,000 mcg as directed See admin instructions. On the 20th of every month    . dextromethorphan-guaiFENesin (MUCINEX DM) 30-600 MG 12hr tablet Take 2 tablets by mouth 2 (two) times daily. 20 tablet 0  . diltiazem (CARDIZEM CD) 240 MG 24 hr capsule Take 1 capsule (240 mg total) by mouth daily. 30 capsule 0  . Emollient [facility]) OINT Apply 1 application topically 3 (three) times daily. To penis    . finasteride (PROSCAR) 5 MG tablet Take 5 mg by mouth daily.    . isosorbide mononitrate (IMDUR) 30 MG 24 hr tablet Take 30 mg by mouth daily.    [name] levalbuterol (XOPENEX) 1.25 MG/0.5ML nebulizer solution Take 1.25 mg by nebulization 3 (three) times daily. (Patient taking differently: Take 1.25 mg by nebulization every 4 (four) hours. ) 1 each 12  . lisinopril (PRINIVIL,ZESTRIL) 20 MG tablet Take 1 tablet (20 mg total) by mouth daily.    [name] loratadine (CLARITIN) 10 MG tablet Take 1 tablet (10 mg total) by mouth daily. 30 tablet 3  . metoprolol (LOPRESSOR) 100 MG tablet Take 100 mg by mouth 2 (two) times daily.    . Multiple Vitamins-Minerals (PRESERVISION AREDS 2+MULTI VIT) CAPS Take 1 capsule by mouth 2 (two) times daily.     [name] nystatin (NYSTATIN) powder Apply topically 3 (three) times daily. Affected areas on groin and buttocks    . polyethylene glycol (MIRALAX / GLYCOLAX) packet Take 17 g by mouth 2 (two) times daily. 14 each 0  . ranitidine (ZANTAC) 300 MG tablet Take 300 mg by mouth at bedtime.    . tamsulosin (FLOMAX) 0.4 MG CAPS capsule Take 1 capsule (0.4 mg total) by mouth daily after supper. 30 capsule 0  . tiotropium (SPIRIVA HANDIHALER) 18 MCG inhalation capsule Place 1 capsule (18 mcg total) into inhaler and inhale daily. 30 capsule 2  . traZODone (DESYREL)  50 MG tablet Take 50 mg by mouth at bedtime.     . Vitamin D, Cholecalciferol, 1000 units TABS Take 1,000 Units by mouth at bedtime.    . furosemide (LASIX) 80 MG tablet Take 1 tablet (80 mg total) by mouth daily. 60 tablet 1     Discharge Medications: Please see discharge summary for a list of discharge medications.  Relevant Imaging Results:  Relevant Lab Results:   Additional Information SN: [id number]  Benard Halsted, LCSWA

## 2017-09-07 NOTE — Telephone Encounter (Signed)
Called patient daughter [name] and advised of note from Dr.Croitoru. Patient daughter verbalized understanding.

## 2017-09-07 NOTE — Progress Notes (Signed)
8/27: Covering CSW received call from [facility] that they did not receive an FL2 on this patient at discharge. They requested this CSW complete and FL2. CSW entered into the chart and paged MD for electronic signature.   Percell Locus Derric Dealmeida LCSW [phone]

## 2017-09-07 NOTE — Telephone Encounter (Signed)
2L restriction should suffice. OK to switch back to protonix.

## 2017-09-08 ENCOUNTER — Non-Acute Institutional Stay: Payer: Medicare Other | Admitting: *Deleted

## 2017-09-08 ENCOUNTER — Non-Acute Institutional Stay: Payer: Medicare Other | Admitting: Licensed Clinical Social Worker

## 2017-09-08 ENCOUNTER — Telehealth: Payer: Self-pay | Admitting: Licensed Clinical Social Worker

## 2017-09-08 VITALS — BP 138/60 | HR 64 | Resp 18

## 2017-09-08 DIAGNOSIS — Z515 Encounter for palliative care: Secondary | ICD-10-CM

## 2017-09-09 DIAGNOSIS — I480 Paroxysmal atrial fibrillation: Secondary | ICD-10-CM | POA: Diagnosis not present

## 2017-09-09 DIAGNOSIS — I5031 Acute diastolic (congestive) heart failure: Secondary | ICD-10-CM | POA: Diagnosis not present

## 2017-09-09 DIAGNOSIS — J449 Chronic obstructive pulmonary disease, unspecified: Secondary | ICD-10-CM | POA: Diagnosis not present

## 2017-09-09 DIAGNOSIS — I2510 Atherosclerotic heart disease of native coronary artery without angina pectoris: Secondary | ICD-10-CM | POA: Diagnosis not present

## 2017-09-09 NOTE — Progress Notes (Signed)
COMMUNITY PALLIATIVE CARE SW NOTE  PATIENT NAME: [name] [name] DOB: 02-28-1925 MRN: [id number]  PRIMARY CARE PROVIDER: [name], MD  RESPONSIBLE PARTY:  Acct ID - Guarantor Home Phone Work Phone Relationship Acct Type  [id number] TYRI, ELMORE [phone]  Self P/F     [facility], [address], [place], [place] [address]     PLAN OF CARE and INTERVENTIONS:             1. GOALS OF CARE/ ADVANCE CARE PLANNING:  To determine with family. 2. SOCIAL/EMOTIONAL/SPIRITUAL ASSESSMENT/ INTERVENTIONS:  SW and Palliative Care RN, Daryl Eastern, met with patient and facility CNA, Raquel Sarna.  Patient was born in [place] and raised in [place].  He was in his w/c in the hallway speaking with another resident.  Patient answered most questions.  SW provided active listening and supportive counseling while he talked about his daughters.  Patient denied pain. 3. PATIENT/CAREGIVER EDUCATION/ COPING:  Patient is able to express his needs to staff. 4. PERSONAL EMERGENCY PLAN:  Per facility protocol. 5. COMMUNITY RESOURCES COORDINATION/ HEALTH CARE NAVIGATION:  None per staff. 6. FINANCIAL/LEGAL CONCERNS/INTERVENTIONS:  None identified.     SOCIAL HX:  Social History   Tobacco Use  . Smoking status: Former Smoker    Packs/day: 1.00    Years: 20.00    Pack years: 20.00    Types: Cigarettes    Last attempt to quit: 01/12/1962    Years since quitting: 55.6  . Smokeless tobacco: Never Used  Substance Use Topics  . Alcohol use: No    Alcohol/week: 0.0 standard drinks    Comment: 05/08/2014 "he's been in [place] for ~!35 yrs"    CODE STATUS:  Full code  ADVANCED DIRECTIVES: N MOST FORM COMPLETE:  N HOSPICE EDUCATION PROVIDED: N PPS:  Patient's appetite is normal.  He is a ble to propel himself in his w/c around the facility. Duration of visit and documentation:  45 minutes.      Creola Corn Naylani Bradner, LCSW

## 2017-09-10 ENCOUNTER — Non-Acute Institutional Stay: Payer: Medicare Other | Admitting: Licensed Clinical Social Worker

## 2017-09-10 ENCOUNTER — Telehealth: Payer: Self-pay | Admitting: Cardiology

## 2017-09-10 DIAGNOSIS — Z515 Encounter for palliative care: Secondary | ICD-10-CM

## 2017-09-10 DIAGNOSIS — R21 Rash and other nonspecific skin eruption: Secondary | ICD-10-CM | POA: Diagnosis not present

## 2017-09-10 DIAGNOSIS — I5030 Unspecified diastolic (congestive) heart failure: Secondary | ICD-10-CM | POA: Diagnosis not present

## 2017-09-10 DIAGNOSIS — R339 Retention of urine, unspecified: Secondary | ICD-10-CM | POA: Diagnosis not present

## 2017-09-10 MED ORDER — FUROSEMIDE 80 MG PO TABS: 80.0000 mg | ORAL_TABLET | Freq: Every day | ORAL | 1 refills | Status: DC

## 2017-09-10 NOTE — Telephone Encounter (Signed)
Labs on 8/25 looked good. Try to keep weight 185-190.   Extra Lasix on days that weight is equal to or over 190.   Do we already have a plan for additional lab work?  If not I would repeat a basic metabolic panel in 2 weeks Thanks MCr

## 2017-09-10 NOTE — Telephone Encounter (Signed)
Pts daughter.[name] called to report to Dr. Loletha Grayer the pts weight since d/c from the hospital on Sunday...   Mon 182 after vomiting Tues 185 before foley removed and pt is            voiding on his own  Wed 183 Th 190 was given extra lasix  Fri 190 and plan to do an in/out cath   She says no edema, no sob, and pt says he feels well. PA and urologist saw pt and says his lungs sound clear...  Pt is at [facility]) [phone].   Added info.... Pt was not eating well in the hospital... He also vomited Sunday before discharge.

## 2017-09-10 NOTE — Telephone Encounter (Signed)
Pts daughter.. Advised orders.

## 2017-09-10 NOTE — Telephone Encounter (Signed)
Joy with [facility] given Dr. Lurline Del recommendations. Will draw labs at 09/15/17 prior to visit with Kerin Ransom PA.

## 2017-09-10 NOTE — Telephone Encounter (Signed)
New Message:      Pt c/o swelling: STAT is pt has developed SOB within 24 hours  1) How much weight have you gained and in what time span? 7 lb weight gain  2) If swelling, where is the swelling located? Not sure of site  3) Are you currently taking a fluid pill? Yes   4) Are you currently SOB? No  5) Do you have a log of your daily weights (if so, list)? No  6) Have you gained 3 pounds in a day or 5 pounds in a week? No  7) Have you traveled recently? No

## 2017-09-11 NOTE — Progress Notes (Signed)
COMMUNITY PALLIATIVE CARE SW NOTE  PATIENT NAME: [name] [name] DOB: March 17, 1925 MRN: [id number]  PRIMARY CARE PROVIDER: [name], MD  RESPONSIBLE PARTY:  Acct ID - Guarantor Home Phone Work Phone Relationship Acct Type  [id number] EVIN, LOISEAU [phone]  Self P/F     [facility], [address], [place], [place] [address]     PLAN OF CARE and INTERVENTIONS:             1. GOALS OF CARE/ ADVANCE CARE PLANNING:  For patient to be comfortable.  Patient is a full code. 2. SOCIAL/EMOTIONAL/SPIRITUAL ASSESSMENT/ INTERVENTIONS:  SW met with patient, facility SW, Sarah, and facility nurse, Silva Bandy.  Patient was sleeping in bed and did not display any nonverbal indicators of pain.  SW provided education regarding the Palliative Care program to both SW and Therapist, sports.  Facility SW requested assistance regarding patient's code status.  She said he was a DNR previously, but that his family changed it back to full code.  Judson Roch reported the daughter's make decisions together and are very supportive of patient. 3. PATIENT/CAREGIVER EDUCATION/ COPING:  Patient expresses his needs to staff. 4. PERSONAL EMERGENCY PLAN:  Per facility protocol. 5. COMMUNITY RESOURCES COORDINATION/ HEALTH CARE NAVIGATION:  None. 6. FINANCIAL/LEGAL CONCERNS/INTERVENTIONS:  None per staff.     SOCIAL HX:  Social History   Tobacco Use  . Smoking status: Former Smoker    Packs/day: 1.00    Years: 20.00    Pack years: 20.00    Types: Cigarettes    Last attempt to quit: 01/12/1962    Years since quitting: 55.7  . Smokeless tobacco: Never Used  Substance Use Topics  . Alcohol use: No    Alcohol/week: 0.0 standard drinks    Comment: 05/08/2014 "he's been in [place] for ~!35 yrs"    CODE STATUS:  Full Code ADVANCED DIRECTIVES: N MOST FORM COMPLETE:  N HOSPICE EDUCATION PROVIDED:  N PPS:  Patient's appetite is normal.  He propels himself in his w/c throughout the facility. Duration of visit and documentation:  45  minutes.      Creola Corn Thad Osoria, LCSW

## 2017-09-14 NOTE — Progress Notes (Signed)
COMMUNITY PALLIATIVE CARE RN NOTE  PATIENT NAME: [name] [name] DOB: [birth year] MRN: [id number]  PRIMARY CARE PROVIDER: [name], MD  RESPONSIBLE PARTY:  Acct ID - Guarantor Home Phone Work Phone Relationship Acct Type  [id number] CHRISTIE, COPLEY [phone]  Self P/F     [facility], [address], [place], [place] [address]    PLAN OF CARE and INTERVENTION:  1. ADVANCE CARE PLANNING/GOALS OF CARE: Remain at [facility], avoid hospitalizations 2. PATIENT/CAREGIVER EDUCATION: Reinforced Safe Mobility/Transfers, Energy Conservation 3. DISEASE STATUS: Joint visit made with [place]. Patient sitting up in his wheelchair in the hallway talking with another resident. He is very pleasant and able to answer simple questions. Speech is difficult to understand at times. He denies pain. He is able to propel himself in his wheelchair, but requires 1 person assistance with bathing, dressing and transfers. He is wearing Oxygen at 2L/min via [place]. No dyspnea noted, but he does not feel oxygen is needed. His intake is fair. No dysphagia reported. Facility has no concerns at this time and states that he has had no complaints today. He was recently hospitalized and there have been some changes to his medications. Protonix and Seroquel have been discontinued. Lasix increased to 80 mg BID. Diet recommendation was Heart Healthy, 2 gm NA and a fluid restriction of 1.5 L/day.    HISTORY OF PRESENT ILLNESS: This is a 84 yo male who resides at [facility]. He was recently admitted to the hospital on 09/01/17 d/t hypoxia, bilateral pleural effusions and pulmonary edema and discharged on 09/05/17. Palliative Care to follow patient to assess overall condition and assist with symptom management needs.   CODE STATUS: FULL CODE  ADVANCED DIRECTIVES: N MOST FORM: no PPS: 30%   PHYSICAL EXAM:   VITALS: BP 138/60, P 64, R 18, O2 96% 2L/min LUNGS: clear to auscultation  CARDIAC: Cor  irreg, irreg RRR EXTREMITIES: Trace edema to bilateral lower extremities SKIN: Exposed skin is dry and intact  NEURO: Alert and oriented x 2, speech difficult to understand at times, propels self in wheelchair   (Duration of visit and documentation 75 minutes)    Daryl Eastern, RN, BSN

## 2017-09-16 ENCOUNTER — Encounter: Payer: Self-pay | Admitting: Cardiology

## 2017-09-16 ENCOUNTER — Ambulatory Visit (INDEPENDENT_AMBULATORY_CARE_PROVIDER_SITE_OTHER): Payer: Medicare Other | Admitting: Cardiology

## 2017-09-16 VITALS — BP 96/52 | HR 61 | Resp 94 | Ht 71.0 in | Wt 191.0 lb

## 2017-09-16 DIAGNOSIS — Z9861 Coronary angioplasty status: Secondary | ICD-10-CM | POA: Diagnosis not present

## 2017-09-16 DIAGNOSIS — I10 Essential (primary) hypertension: Secondary | ICD-10-CM | POA: Diagnosis not present

## 2017-09-16 DIAGNOSIS — I25118 Atherosclerotic heart disease of native coronary artery with other forms of angina pectoris: Secondary | ICD-10-CM | POA: Diagnosis not present

## 2017-09-16 DIAGNOSIS — I480 Paroxysmal atrial fibrillation: Secondary | ICD-10-CM

## 2017-09-16 DIAGNOSIS — I5033 Acute on chronic diastolic (congestive) heart failure: Secondary | ICD-10-CM

## 2017-09-16 DIAGNOSIS — N320 Bladder-neck obstruction: Secondary | ICD-10-CM

## 2017-09-16 DIAGNOSIS — Z7901 Long term (current) use of anticoagulants: Secondary | ICD-10-CM | POA: Diagnosis not present

## 2017-09-16 DIAGNOSIS — I2510 Atherosclerotic heart disease of native coronary artery without angina pectoris: Secondary | ICD-10-CM

## 2017-09-16 NOTE — Progress Notes (Signed)
09/16/2017 [name] [name]   05-18-1925  [id number]  Primary Physician [name], MD Primary Cardiologist: Dr Juanetta Snow   HPI:  84 y/o [place] male, resident of [facility] here with his daughter [name] [name] for follow up after a recent admission to [facility] 09/02/17-09/05/17 with acute on chronic CHF. The pt has know CAD with a CTO of his LAD and s/p RCA PCI with DES in 2016. His last echo was 09/03/17 and showed his EF to be 60-65% with mild LAE, and a PA [pressure of 33 mmHg. He has PAF and is CHADS VASC=5, on Eliquis His weight on admission was 203 lbs. Dr [name] feels his dry weight is now 185-190 lbs. The pt had apparently been drinking lots of water at the nursing=g facility. He was diuresed and discharged back to [facility]. F/U BMP done 9/2/291 showed a BUN of 22 and a SCr of 1.27.  Symptomatically the pt feels fine- he denies dyspnea and has no LE edema. His weight today is 191 lbs. Weights from the SNF have been recorded and they are running 185-193 lbs. They know to give him extra Lasix if his weight is above 190 lbs.    Current Outpatient Medications  Medication Sig Dispense Refill  . acetaminophen (TYLENOL) 500 MG tablet Take 500-1,000 mg by mouth every 6 (six) hours as needed for mild pain.     [name] apixaban (ELIQUIS) 5 MG TABS tablet Take 1 tablet (5 mg total) by mouth 2 (two) times daily. 180 tablet 3  . Carboxymethylcellulose Sod PF 1 % GEL Place 1 drop into both eyes 2 (two) times daily.     . Cyanocobalamin (B-12 COMPLIANCE INJECTION) 1000 MCG/ML KIT Inject 1,000 mcg as directed See admin instructions. On the 20th of every month    . dextromethorphan-guaiFENesin (MUCINEX DM) 30-600 MG 12hr tablet Take 2 tablets by mouth 2 (two) times daily. 20 tablet 0  . diltiazem (CARDIZEM CD) 240 MG 24 hr capsule Take 1 capsule (240 mg total) by mouth daily. 30 capsule 0  . Emollient [facility]) OINT Apply 1 application topically 3 (three) times daily. To penis    .  finasteride (PROSCAR) 5 MG tablet Take 5 mg by mouth daily.    . furosemide (LASIX) 80 MG tablet Take 1 tablet (80 mg total) by mouth daily. 60 tablet 1  . isosorbide mononitrate (IMDUR) 30 MG 24 hr tablet Take 30 mg by mouth daily.    [name] levalbuterol (XOPENEX) 1.25 MG/0.5ML nebulizer solution Take 1.25 mg by nebulization 3 (three) times daily. (Patient taking differently: Take 1.25 mg by nebulization every 4 (four) hours. ) 1 each 12  . lisinopril (PRINIVIL,ZESTRIL) 20 MG tablet Take 1 tablet (20 mg total) by mouth daily.    [name] loratadine (CLARITIN) 10 MG tablet Take 1 tablet (10 mg total) by mouth daily. 30 tablet 3  . metoprolol (LOPRESSOR) 100 MG tablet Take 100 mg by mouth 2 (two) times daily.    . Multiple Vitamins-Minerals (PRESERVISION AREDS 2+MULTI VIT) CAPS Take 1 capsule by mouth 2 (two) times daily.     [name] nystatin (NYSTATIN) powder Apply topically 3 (three) times daily. Affected areas on groin and buttocks    . pantoprazole (PROTONIX) 40 MG tablet Take 40 mg by mouth daily.    . polyethylene glycol (MIRALAX / GLYCOLAX) packet Take 17 g by mouth 2 (two) times daily. 14 each 0  . ranitidine (ZANTAC) 300 MG tablet Take 300 mg by mouth at bedtime.    [name]  tamsulosin (FLOMAX) 0.4 MG CAPS capsule Take 1 capsule (0.4 mg total) by mouth daily after supper. 30 capsule 0  . tiotropium (SPIRIVA HANDIHALER) 18 MCG inhalation capsule Place 1 capsule (18 mcg total) into inhaler and inhale daily. 30 capsule 2  . traZODone (DESYREL) 50 MG tablet Take 50 mg by mouth at bedtime.     . Vitamin D, Cholecalciferol, 1000 units TABS Take 1,000 Units by mouth at bedtime.     No current facility-administered medications for this visit.     Allergies  Allergen Reactions  . Cortisone Other (See Comments)    Causes emotional problems     Past Medical History:  Diagnosis Date  . Anginal pain ([place])   . Arthritis    "all over"  . Cancer ([place])   . Coronary artery disease   . Depression ~ 24; ~ 1990    PTSD/Mania   . GERD (gastroesophageal reflux disease)   . High cholesterol   . Hypertension   . Hyperthyroidism   . Neuropathy   . Osteoporosis     Social History   Socioeconomic History  . Marital status: Divorced    Spouse name: Not on file  . Number of children: Not on file  . Years of education: Not on file  . Highest education level: Not on file  Occupational History  . Not on file  Social Needs  . Financial resource strain: Not on file  . Food insecurity:    Worry: Not on file    Inability: Not on file  . Transportation needs:    Medical: Not on file    Non-medical: Not on file  Tobacco Use  . Smoking status: Former Smoker    Packs/day: 1.00    Years: 20.00    Pack years: 20.00    Types: Cigarettes    Last attempt to quit: 01/12/1962    Years since quitting: 55.7  . Smokeless tobacco: Never Used  Substance and Sexual Activity  . Alcohol use: No    Alcohol/week: 0.0 standard drinks    Comment: 05/08/2014 "he's been in [place] for ~!35 yrs"  . Drug use: No  . Sexual activity: Not on file  Lifestyle  . Physical activity:    Days per week: Not on file    Minutes per session: Not on file  . Stress: Not on file  Relationships  . Social connections:    Talks on phone: Not on file    Gets together: Not on file    Attends religious service: Not on file    Active member of club or organization: Not on file    Attends meetings of clubs or organizations: Not on file    Relationship status: Not on file  . Intimate partner violence:    Fear of current or ex partner: Not on file    Emotionally abused: Not on file    Physically abused: Not on file    Forced sexual activity: Not on file  Other Topics Concern  . Not on file  Social History Narrative   He was in [name] a child. He emigrated to the [place] after World War II. He was staying with his host family in [place] when he got drafted for [place].      He now lives in [place] and his daughter  helps in his care.     Family History  Problem Relation Age of Onset  . CAD Mother   . Asthma Mother  Review of Systems: General: negative for chills, fever, night sweats or weight changes.  Cardiovascular: negative for chest pain, dyspnea on exertion, edema, orthopnea, palpitations, paroxysmal nocturnal dyspnea or shortness of breath Dermatological: negative for rash Respiratory: negative for cough or wheezing Urologic: negative for hematuria Abdominal: negative for nausea, vomiting, diarrhea, bright red blood per rectum, melena, or hematemesis Neurologic: negative for visual changes, syncope, or dizziness All other systems reviewed and are otherwise negative except as noted above.    Blood pressure (!) 96/52, pulse 61, resp. rate (!) 94, height 5' 11"  (1.803 m), weight 191 lb (86.6 kg).  General appearance: alert, cooperative, appears stated age, no distress and poor dentition Neck: no JVD Lungs: clear to auscultation bilaterally Heart: regular rate and rhythm Extremities: no edema Skin: cool and dry Neurologic: Grossly normal   ASSESSMENT AND PLAN:   Acute on chronic diastolic heart failure (HCC) Goal weight 185 lb-190 lbs  CAD S/P percutaneous coronary angioplasty Known CTO of his LAD, s/p RCA PCI with DES 2016  Paroxysmal atrial fibrillation (HCC) Holding NSR  Chronic anticoagulation CHADS VASC= 5. He is on Eliquis  Essential hypertension Stable  Bladder outflow obstruction Followed by Dr Tammi Klippel. The pt had bladder outlet obstruction during this past admission but seems to be doing well now, foley pulled Tuesday   PLAN  Continue with current Rx. Check BMP two weeks. F/U with Suanne Marker or dr [name] in 6-8 weeks.   Kerin Ransom PA-C 09/16/2017 4:15 PM

## 2017-09-16 NOTE — Assessment & Plan Note (Signed)
Followed by Dr Tammi Klippel. The pt had bladder outlet obstruction during this past admission but seems to be doing well now, foley pulled Tuesday

## 2017-09-16 NOTE — Assessment & Plan Note (Signed)
CHADS VASC= 5. He is on Eliquis

## 2017-09-16 NOTE — Assessment & Plan Note (Signed)
Known CTO of his LAD, s/p RCA PCI with DES 2016

## 2017-09-16 NOTE — Assessment & Plan Note (Signed)
Goal weight 185 lb-190 lbs

## 2017-09-16 NOTE — Assessment & Plan Note (Signed)
Holding NSR 

## 2017-09-16 NOTE — Assessment & Plan Note (Signed)
Stable

## 2017-09-16 NOTE — Patient Instructions (Addendum)
Medication Instructions:  Your physician recommends that you continue on your current medications as directed. Please refer to the Current Medication list given to you today.  If you need a refill on your cardiac medications before your next appointment, please call your pharmacy.  Labwork: Your physician recommends that you return for lab work in: 2 weeks [place] in our office  Testing/Procedures: None   Follow-Up: Your physician recommends that you schedule a follow-up appointment in: DR [place], PA-C ONLY in 6-8 Weeks  Any Other Special Instructions Will Be Listed Below (If Applicable).

## 2017-09-17 NOTE — Progress Notes (Signed)
His urologist is Dr. [name]. Daughter is concerned he may be having some urinary bladder retention. Can they do bladder US at his facility? [name] [name]

## 2017-09-23 ENCOUNTER — Encounter: Payer: Self-pay | Admitting: Neurology

## 2017-09-23 ENCOUNTER — Ambulatory Visit: Payer: Self-pay | Admitting: Physician Assistant

## 2017-09-23 ENCOUNTER — Ambulatory Visit (INDEPENDENT_AMBULATORY_CARE_PROVIDER_SITE_OTHER): Payer: Medicare Other | Admitting: Neurology

## 2017-09-23 VITALS — BP 110/78 | HR 64 | Ht 71.0 in | Wt 188.0 lb

## 2017-09-23 DIAGNOSIS — R413 Other amnesia: Secondary | ICD-10-CM

## 2017-09-23 DIAGNOSIS — G609 Hereditary and idiopathic neuropathy, unspecified: Secondary | ICD-10-CM | POA: Diagnosis not present

## 2017-09-23 DIAGNOSIS — Z9861 Coronary angioplasty status: Secondary | ICD-10-CM

## 2017-09-23 DIAGNOSIS — R29898 Other symptoms and signs involving the musculoskeletal system: Secondary | ICD-10-CM | POA: Diagnosis not present

## 2017-09-23 DIAGNOSIS — I2510 Atherosclerotic heart disease of native coronary artery without angina pectoris: Secondary | ICD-10-CM

## 2017-09-23 DIAGNOSIS — R4182 Altered mental status, unspecified: Secondary | ICD-10-CM

## 2017-09-23 NOTE — Progress Notes (Signed)
Subjective:    Patient ID: [name] [name] is a 84 y.o. male.  HPI     [name], MD, PhD [facility] [address], [address] [place],  [address]  I saw patient, [name] [name], as a referral from the hospital for evaluation of his memory loss. The patient is accompanied by his daughter, [name] [name], today. He is a 5 year old right-handed gentleman with an underlying complex medical history of osteoporosis, hypertension, hyperlipidemia, depression, anxiety, coronary artery disease with status post stent placement, COPD, history of chronic A. fib, history of cancer, reflux disease, thyroid disease, congestive heart failure with acute on chronic exacerbation and hospitalization recently, currently in skilled nursing facility, who has been in the hospital repeatedly this summer for acute illnesses. His daughter reports that he has had repeated and longer courses of high-dose steroids. In the past, he had mental status changes including manic episodes when he was treated with steroids for different reasons. He has a history of PTSD and in the distant past in 1958 he even required behavioral admission with ECT secondary to mood disorder and behavioral disturbance. At that time he had received prednisone. In 1990 he also was treated with prednisone and required admission at behavioral health at [place] he was treated at the [place] in [place].  During his admission in June 2019 in early July he was noted to have delirium and mental status changes, abnormal behaviors. Per daughter, his mental status changes improved with time. In the summer he was on a very long taper of prednisone for at least a month she recalls. He has had difficulty with mobility, has a long-standing history of neuropathy for which he was worked up in the past. He is currently maxed out with physical therapy, speech therapy and occupational therapy. He had a brain MRI without contrast on 07/31/2017  and I reviewed the results: IMPRESSION: 1. No acute intracranial abnormality. 2. Age-related cerebral atrophy with mild chronic small vessel ischemic disease.  His Past Medical History Is Significant For: Past Medical History:  Diagnosis Date  . Anginal pain ([place])   . Arthritis    "all over"  . Cancer ([place])   . CHF (congestive heart failure) ([place])   . Coronary artery disease   . Dementia   . Depression ~ 9; ~ 1990   PTSD/Mania   . GERD (gastroesophageal reflux disease)   . High cholesterol   . Hypertension   . Hyperthyroidism   . Neuropathy   . Osteoporosis     His Past Surgical History Is Significant For: Past Surgical History:  Procedure Laterality Date  . BACK SURGERY    . CARDIAC CATHETERIZATION  09/2007  . CARDIAC CATHETERIZATION N/A 05/08/2014   Procedure: CORONARY STENT INTERVENTION;  Surgeon: [name], MD; 3.518 mm Xience Alpine DES to the RCA   . LEFT HEART CATHETERIZATION WITH CORONARY ANGIOGRAM N/A 05/08/2014   Procedure: LEFT HEART CATHETERIZATION WITH CORONARY ANGIOGRAM;  Surgeon: [name], MD; LAD 100% (old), CFX 20%, OM1 30%, pRCA 30%, dRCA 95%, EF 55%     . LUMBAR LAMINECTOMY/DECOMPRESSION MICRODISCECTOMY  2011    His Family History Is Significant For: Family History  Problem Relation Age of Onset  . CAD Mother   . Asthma Mother     His Social History Is Significant For: Social History   Socioeconomic History  . Marital status: Divorced    Spouse name: Not on file  . Number of children: 3  . Years  of education: unknown  . Highest education level: Not on file  Occupational History  . Not on file  Social Needs  . Financial resource strain: Not on file  . Food insecurity:    Worry: Not on file    Inability: Not on file  . Transportation needs:    Medical: Not on file    Non-medical: Not on file  Tobacco Use  . Smoking status: Former Smoker    Packs/day: 1.00    Years: 20.00    Pack years: 20.00    Types: Cigarettes    Last  attempt to quit: 01/12/1962    Years since quitting: 55.7  . Smokeless tobacco: Never Used  Substance and Sexual Activity  . Alcohol use: No    Alcohol/week: 0.0 standard drinks    Comment: 05/08/2014 "he's been in [place] for ~!35 yrs"  . Drug use: No  . Sexual activity: Not on file  Lifestyle  . Physical activity:    Days per week: Not on file    Minutes per session: Not on file  . Stress: Not on file  Relationships  . Social connections:    Talks on phone: Not on file    Gets together: Not on file    Attends religious service: Not on file    Active member of club or organization: Not on file    Attends meetings of clubs or organizations: Not on file    Relationship status: Not on file  Other Topics Concern  . Not on file  Social History Narrative   He was in [name] youth camps as a child. He emigrated to the [place] after World War II. He was staying with his host family in [place] when he got drafted for [place].      09/23/17 He now lives in [place], [place] and his daughter helps in his care.    His Allergies Are:  Allergies  Allergen Reactions  . Cortisone Other (See Comments)    Causes emotional problems   :   His Current Medications Are:  Outpatient Encounter Medications as of 09/23/2017  Medication Sig  . acetaminophen (TYLENOL) 500 MG tablet Take 500-1,000 mg by mouth every 6 (six) hours as needed for mild pain.   [name] apixaban (ELIQUIS) 5 MG TABS tablet Take 1 tablet (5 mg total) by mouth 2 (two) times daily.  . Carboxymethylcellulose Sod PF 1 % GEL Place 1 drop into both eyes 2 (two) times daily.   . Cyanocobalamin (B-12 COMPLIANCE INJECTION) 1000 MCG/ML KIT Inject 1,000 mcg as directed See admin instructions. On the 20th of every month  . dextromethorphan-guaiFENesin (MUCINEX DM) 30-600 MG 12hr tablet Take 2 tablets by mouth 2 (two) times daily.  [name] diltiazem (CARDIZEM CD) 240 MG 24 hr capsule Take 1 capsule (240 mg total) by mouth daily.  Blanca Friend  [facility]) OINT Apply 1 application topically 3 (three) times daily. To penis  . finasteride (PROSCAR) 5 MG tablet Take 5 mg by mouth daily.  . furosemide (LASIX) 80 MG tablet Take 1 tablet (80 mg total) by mouth daily.  . isosorbide mononitrate (IMDUR) 30 MG 24 hr tablet Take 30 mg by mouth daily.  [name] levalbuterol (XOPENEX) 1.25 MG/0.5ML nebulizer solution Take 1.25 mg by nebulization 3 (three) times daily. (Patient taking differently: Take 1.25 mg by nebulization every 4 (four) hours. )  . lisinopril (PRINIVIL,ZESTRIL) 20 MG tablet Take 1 tablet (20 mg total) by mouth daily.  [name] loratadine (CLARITIN) 10 MG tablet Take  1 tablet (10 mg total) by mouth daily.  . metoprolol (LOPRESSOR) 100 MG tablet Take 100 mg by mouth 2 (two) times daily.  . Multiple Vitamins-Minerals (PRESERVISION AREDS 2+MULTI VIT) CAPS Take 1 capsule by mouth 2 (two) times daily.   [name] nystatin (NYSTATIN) powder Apply topically 3 (three) times daily. Affected areas on groin and buttocks  . pantoprazole (PROTONIX) 40 MG tablet Take 40 mg by mouth daily.  . polyethylene glycol (MIRALAX / GLYCOLAX) packet Take 17 g by mouth 2 (two) times daily.  . tamsulosin (FLOMAX) 0.4 MG CAPS capsule Take 1 capsule (0.4 mg total) by mouth daily after supper.  . tiotropium (SPIRIVA HANDIHALER) 18 MCG inhalation capsule Place 1 capsule (18 mcg total) into inhaler and inhale daily.  . traZODone (DESYREL) 50 MG tablet Take 50 mg by mouth at bedtime.   . Vitamin D, Cholecalciferol, 1000 units TABS Take 1,000 Units by mouth at bedtime.  . ranitidine (ZANTAC) 300 MG tablet Take 300 mg by mouth at bedtime.   No facility-administered encounter medications on file as of 09/23/2017.   : Review of Systems:  Out of a complete 14 point review of systems, all are reviewed and negative with the exception of these symptoms as listed below:  Review of Systems  Neurological:       Pt presents today to discuss his memory. Pt resides at [facility].     Objective:  Neurological Exam  Physical Exam Physical Examination:   Vitals:   09/23/17 1004  BP: 110/78  Pulse: 64    General Examination: The patient is a very pleasant 84 y.o. male in no acute distress. He is situated in his wheelchair. He is very hard of hearing. He speaks [place] and [place], was able to communicate with me in both languages.   HEENT: Normocephalic, atraumatic, pupils are equal, extraocular tracking is fair. He has corrective eyeglasses. He has hearing aids in place but the batteries are not up-to-date so he is very hard of hearing. Speech is mildly to moderately dysarthric, no lip, neck or jaw tremor, neck mobility is fairly good. He does have brief coughing spells after drinking water a few times.  Chest: Clear to auscultation without wheezing, rhonchi or crackles noted.  Heart: S1+S2+0, regular and normal without murmurs, rubs or gallops noted.   Abdomen: Soft, non-tender and non-distended.  Extremities: There is trace pitting edema in the distal left lower extremity.   Skin: Warm and dry without trophic changes noted.  Musculoskeletal: exam reveals no obvious joint deformities, tenderness or joint swelling or erythema.   Neurologically:  Mental status: The patient is awake, alert and oriented in all 4 spheres. His memory, attention, immediate and remote memory are mildly impaired. His MMSE is 26 out of 30, clock drawing is 1 out of 4, animal fluency 5/m.  His speech is dysarthric, he is very hard of hearing which contributes to some difficulty with communication but he is able to answer questions appropriately in [place] and [place].   Mood is normal and affect is normal.  Cranial nerves II - XII are as described above under HEENT exam. In addition: shoulder shrug is normal with equal shoulder height noted. Motor exam: Fairly normal bulk with the exception than her thigh muscles, global strength of 4+ out of 5 with more proximal weakness noted  bilateral upper and lower extremities with some incoordination most likely secondary to mild weakness. He has very diminished reflexes, trace in the upper extremities absent in the lower extremities,  reduced sensation to all modalities in the legs, to some degree in the distal hands. Has a history of neuropathy for years. He is unable to stand and walk for me. Finger to nose is mildly abnormal, no clear-cut tremor noted. No resting tremor or action tremor or postural tremor in both upper extremities, no tremor in the feet, fine motor skills in the distal lower extremities with foot taps are mildly impaired, no lateralization noted.   Assessment and Plan:   In summary, [name] [name] is a very pleasant 84 y.o.-year old male with an underlying complex medical history of osteoporosis, hypertension, hyperlipidemia, depression, anxiety, coronary artery disease with status post stent placement, COPD, history of chronic A. fib, history of cancer, reflux disease, thyroid disease, congestive heart failure with acute on chronic exacerbation and hospitalization recently, currently in skilled nursing facility, who Presents for evaluation of his memory loss. He has evidence of mild memory loss. He had a recent brain MRI which we reviewed. He had altered mental status with recent hospitalizations, in the context of acute illnesses as well as probably secondary to high-dose steroids. He has had abnormal reactions with behavioral escalations on steroids in the past, according to his daughter. I did not see any signs of parkinsonism and reassured her in that regard. He has a long-standing history of neuropathy. 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 The patient is encouraged to continue with his therapies at his skilled nursing facility. I did not suggest any new medications from my end of things and he does not need  any additional testing. I did not suggest any dementia medication. I suggested as needed follow-up. I answered all their questions today and the patient and his daughter were in agreement.  [name], MD, PhD

## 2017-09-23 NOTE — Patient Instructions (Signed)
It was nice to meet you! Your memory loss is mild. I would not recommend any additional testing or medication for memory loss. More likely than not your memory issues were from acute illness, recent multiple hospitalizations and from taking high doses of steroids. You have mild muscle weakness, more so in the upper arms and thigh muscles. This is again likely from a combination of factors including being treated repeatedly with steroids, history of neuropathy and overall deconditioning.  I do not see any signs of parkinsonism. Please continue with your therapies. I can see you back as needed.  (Es war gut, Bedelia Person mit [employer] reden. Viel [employer] und [employer]!)

## 2017-09-24 ENCOUNTER — Non-Acute Institutional Stay: Payer: Medicare Other | Admitting: *Deleted

## 2017-09-24 DIAGNOSIS — Z515 Encounter for palliative care: Secondary | ICD-10-CM

## 2017-09-27 ENCOUNTER — Ambulatory Visit: Payer: Medicare Other | Admitting: Diagnostic Neuroimaging

## 2017-09-27 ENCOUNTER — Encounter

## 2017-09-27 NOTE — Progress Notes (Signed)
COMMUNITY PALLIATIVE CARE RN NOTE  PATIENT NAME: [name] [name] DOB: 1925-05-18 MRN: [id number]  PRIMARY CARE PROVIDER: Jodi Marble, MD  RESPONSIBLE PARTY:  Acct ID - Guarantor Home Phone Work Phone Relationship Acct Type  [id number] OSMEL, DYKSTRA [phone]  Self P/F     [place], [place], [place] [address]    PLAN OF CARE and INTERVENTION:  1. ADVANCE CARE PLANNING/GOALS OF CARE: Remain at [facility] facility, avoid hospitalizations 2. PATIENT/CAREGIVER EDUCATION: Reinforced Safe Mobility/Transfers 3. DISEASE STATUS: Upon arrival, patient sitting up in his wheelchair in the hallway with his daughter [name] [name] present. This RN walks beside patient as he propels himself down the hallway. He denies pain. He is no longer requiring Oxygen on a continuous basis. No dyspnea present. He is able to answer questions and make needs known. He requires assistance with bathing, dressing, standing and transfers. He is able to feed himself independently. Intake is good. He reports feeling good today and did not have any complaints/concerns. Will continue to monitor.   HISTORY OF PRESENT ILLNESS:  This is a 84 yo male who resides at [facility] on the SNF unit. Palliative Care Team continues to follow patient.    CODE STATUS: FULL CODE ADVANCED DIRECTIVES: N MOST FORM: no PPS: 30%   PHYSICAL EXAM:   LUNGS: clear to auscultation  CARDIAC: Cor irregular EXTREMITIES: No edema SKIN: Exposed skin dry and intact  NEURO: Alert, able to make needs known, generalized weakness, propels self in wheelchair   (Duration of visit and documentation 60 minutes)    [name] Eastern, RN, BSN

## 2017-09-30 ENCOUNTER — Encounter: Payer: Self-pay | Admitting: Cardiovascular Disease

## 2017-09-30 DIAGNOSIS — L570 Actinic keratosis: Secondary | ICD-10-CM | POA: Diagnosis not present

## 2017-09-30 DIAGNOSIS — L821 Other seborrheic keratosis: Secondary | ICD-10-CM | POA: Diagnosis not present

## 2017-09-30 DIAGNOSIS — Z85828 Personal history of other malignant neoplasm of skin: Secondary | ICD-10-CM | POA: Diagnosis not present

## 2017-10-12 DIAGNOSIS — F329 Major depressive disorder, single episode, unspecified: Secondary | ICD-10-CM | POA: Diagnosis not present

## 2017-10-12 DIAGNOSIS — R498 Other voice and resonance disorders: Secondary | ICD-10-CM | POA: Diagnosis not present

## 2017-10-12 DIAGNOSIS — Z85828 Personal history of other malignant neoplasm of skin: Secondary | ICD-10-CM | POA: Diagnosis not present

## 2017-10-12 DIAGNOSIS — I2510 Atherosclerotic heart disease of native coronary artery without angina pectoris: Secondary | ICD-10-CM | POA: Diagnosis not present

## 2017-10-12 DIAGNOSIS — R41841 Cognitive communication deficit: Secondary | ICD-10-CM | POA: Diagnosis not present

## 2017-10-12 DIAGNOSIS — R2689 Other abnormalities of gait and mobility: Secondary | ICD-10-CM | POA: Diagnosis not present

## 2017-10-12 DIAGNOSIS — R339 Retention of urine, unspecified: Secondary | ICD-10-CM | POA: Diagnosis not present

## 2017-10-12 DIAGNOSIS — I10 Essential (primary) hypertension: Secondary | ICD-10-CM | POA: Diagnosis not present

## 2017-10-12 DIAGNOSIS — M6281 Muscle weakness (generalized): Secondary | ICD-10-CM | POA: Diagnosis not present

## 2017-10-12 DIAGNOSIS — Z7901 Long term (current) use of anticoagulants: Secondary | ICD-10-CM | POA: Diagnosis not present

## 2017-10-12 DIAGNOSIS — I5032 Chronic diastolic (congestive) heart failure: Secondary | ICD-10-CM | POA: Diagnosis not present

## 2017-10-12 DIAGNOSIS — F4310 Post-traumatic stress disorder, unspecified: Secondary | ICD-10-CM | POA: Diagnosis not present

## 2017-10-12 DIAGNOSIS — I714 Abdominal aortic aneurysm, without rupture: Secondary | ICD-10-CM | POA: Diagnosis not present

## 2017-10-12 DIAGNOSIS — I5030 Unspecified diastolic (congestive) heart failure: Secondary | ICD-10-CM | POA: Diagnosis not present

## 2017-10-12 DIAGNOSIS — I110 Hypertensive heart disease with heart failure: Secondary | ICD-10-CM | POA: Diagnosis not present

## 2017-10-12 DIAGNOSIS — E039 Hypothyroidism, unspecified: Secondary | ICD-10-CM | POA: Diagnosis not present

## 2017-10-12 DIAGNOSIS — J449 Chronic obstructive pulmonary disease, unspecified: Secondary | ICD-10-CM | POA: Diagnosis not present

## 2017-10-12 DIAGNOSIS — R05 Cough: Secondary | ICD-10-CM | POA: Diagnosis not present

## 2017-10-12 DIAGNOSIS — G9340 Encephalopathy, unspecified: Secondary | ICD-10-CM | POA: Diagnosis not present

## 2017-10-12 DIAGNOSIS — E785 Hyperlipidemia, unspecified: Secondary | ICD-10-CM | POA: Diagnosis not present

## 2017-10-12 DIAGNOSIS — F319 Bipolar disorder, unspecified: Secondary | ICD-10-CM | POA: Diagnosis not present

## 2017-10-12 DIAGNOSIS — R1312 Dysphagia, oropharyngeal phase: Secondary | ICD-10-CM | POA: Diagnosis not present

## 2017-10-12 DIAGNOSIS — Z955 Presence of coronary angioplasty implant and graft: Secondary | ICD-10-CM | POA: Diagnosis not present

## 2017-10-12 DIAGNOSIS — I5031 Acute diastolic (congestive) heart failure: Secondary | ICD-10-CM | POA: Diagnosis not present

## 2017-10-12 DIAGNOSIS — I480 Paroxysmal atrial fibrillation: Secondary | ICD-10-CM | POA: Diagnosis not present

## 2017-10-12 DIAGNOSIS — F0281 Dementia in other diseases classified elsewhere with behavioral disturbance: Secondary | ICD-10-CM | POA: Diagnosis not present

## 2017-10-12 DIAGNOSIS — F309 Manic episode, unspecified: Secondary | ICD-10-CM | POA: Diagnosis not present

## 2017-10-12 DIAGNOSIS — G629 Polyneuropathy, unspecified: Secondary | ICD-10-CM | POA: Diagnosis not present

## 2017-10-12 DIAGNOSIS — B029 Zoster without complications: Secondary | ICD-10-CM | POA: Diagnosis not present

## 2017-10-12 DIAGNOSIS — R278 Other lack of coordination: Secondary | ICD-10-CM | POA: Diagnosis not present

## 2017-10-14 DIAGNOSIS — J449 Chronic obstructive pulmonary disease, unspecified: Secondary | ICD-10-CM | POA: Diagnosis not present

## 2017-10-14 DIAGNOSIS — I5031 Acute diastolic (congestive) heart failure: Secondary | ICD-10-CM | POA: Diagnosis not present

## 2017-10-14 DIAGNOSIS — I480 Paroxysmal atrial fibrillation: Secondary | ICD-10-CM | POA: Diagnosis not present

## 2017-10-14 DIAGNOSIS — I10 Essential (primary) hypertension: Secondary | ICD-10-CM | POA: Diagnosis not present

## 2017-10-19 ENCOUNTER — Non-Acute Institutional Stay: Payer: Medicare Other | Admitting: Licensed Clinical Social Worker

## 2017-10-19 DIAGNOSIS — Z515 Encounter for palliative care: Secondary | ICD-10-CM

## 2017-10-20 NOTE — Progress Notes (Signed)
COMMUNITY PALLIATIVE CARE SW NOTE  PATIENT NAME: [name] [name] DOB: Nov 14, 1925 MRN: [id number]  PRIMARY CARE PROVIDER: Jodi Marble, MD  RESPONSIBLE PARTY:  Acct ID - Guarantor Home Phone Work Phone Relationship Acct Type  [id number] AMOUR, CUTRONE [phone]  Self P/F     [place], [place], [place] [address]     PLAN OF CARE and INTERVENTIONS:             1. GOALS OF CARE/ ADVANCE CARE PLANNING:  Remain at [facility] and avoid hospitalizations. 2. SOCIAL/EMOTIONAL/SPIRITUAL ASSESSMENT/ INTERVENTIONS:  SW attempted to meet with patient at [facility].  Was informed by Med Cleora Fleet, that patient was at a MD appointment.  She also stated he fell a few weeks ago while attempting to go to the bathroom by himself.  Patient sustained no injuries.  His daughter's visit daily and are very supportive. 3. PATIENT/CAREGIVER EDUCATION/ COPING:  Patient copes by expressing his feelings openly. 4. PERSONAL EMERGENCY PLAN:  Per facility protocol. 5. COMMUNITY RESOURCES COORDINATION/ HEALTH CARE NAVIGATION:  None 6. FINANCIAL/LEGAL CONCERNS/INTERVENTIONS:  None     SOCIAL HX:  Social History   Tobacco Use  . Smoking status: Former Smoker    Packs/day: 1.00    Years: 20.00    Pack years: 20.00    Types: Cigarettes    Last attempt to quit: 01/12/1962    Years since quitting: 55.8  . Smokeless tobacco: Never Used  Substance Use Topics  . Alcohol use: No    Alcohol/week: 0.0 standard drinks    Comment: 05/08/2014 "he's been in [place] for ~!35 yrs"    CODE STATUS:  Full Code  ADVANCED DIRECTIVES: N MOST FORM COMPLETE:  N HOSPICE EDUCATION PROVIDED: N PPS:  Patient's appetite is normal per staff.  He propels himself in his w/c. Duration of visit and documentation:  40 minutes.      Creola Corn Luvada Salamone, LCSW

## 2017-10-21 ENCOUNTER — Non-Acute Institutional Stay: Payer: Medicare Other | Admitting: Licensed Clinical Social Worker

## 2017-10-21 DIAGNOSIS — Z515 Encounter for palliative care: Secondary | ICD-10-CM

## 2017-10-22 DIAGNOSIS — I480 Paroxysmal atrial fibrillation: Secondary | ICD-10-CM | POA: Diagnosis not present

## 2017-10-22 DIAGNOSIS — J449 Chronic obstructive pulmonary disease, unspecified: Secondary | ICD-10-CM | POA: Diagnosis not present

## 2017-10-22 DIAGNOSIS — I5030 Unspecified diastolic (congestive) heart failure: Secondary | ICD-10-CM | POA: Diagnosis not present

## 2017-10-22 DIAGNOSIS — I2510 Atherosclerotic heart disease of native coronary artery without angina pectoris: Secondary | ICD-10-CM | POA: Diagnosis not present

## 2017-10-22 NOTE — Progress Notes (Signed)
COMMUNITY PALLIATIVE CARE SW NOTE  PATIENT NAME: [name] [name] DOB: 12/17/1925 MRN: [id number]  PRIMARY CARE PROVIDER: Tejan-Sie, S Ahmed, MD  RESPONSIBLE PARTY:  Acct ID - Guarantor Home Phone Work Phone Relationship Acct Type  [id number] - Liebler,Lani [phone]  Self P/F     [address], [place], [place] [address]     PLAN OF CARE and INTERVENTIONS:             1. GOALS OF CARE/ ADVANCE CARE PLANNING:  [facility].  Patient is a full code. 2. SOCIAL/EMOTIONAL/SPIRITUAL ASSESSMENT/ INTERVENTIONS:  SW met with patient in his room at [facility].  He appeared to be sleeping comfortably, but aroused to verbal tactile.  He was oriented x3.  He stated he went to the MD last Tuesday and there were no issues.  He said he vomited this morning and that the RN was aware.  SW consulted RN Supervisor, Ashley, who stated patient appears to have adjusted well.  She no other concerns. 3. PATIENT/CAREGIVER EDUCATION/ COPING:  Patient copes by expressing his needs and feelings. 4. PERSONAL EMERGENCY PLAN:  Per facility protocol. 5. COMMUNITY RESOURCES COORDINATION/ HEALTH CARE NAVIGATION:  None 6. FINANCIAL/LEGAL CONCERNS/INTERVENTIONS:  None     SOCIAL HX:  Social History   Tobacco Use  . Smoking status: Former Smoker    Packs/day: 1.00    Years: 20.00    Pack years: 20.00    Types: Cigarettes    Last attempt to quit: 01/12/1962    Years since quitting: 55.8  . Smokeless tobacco: Never Used  Substance Use Topics  . Alcohol use: No    Alcohol/week: 0.0 standard drinks    Comment: 05/08/2014 "he's been in [facility] for ~!35 yrs"    CODE STATUS:  Full Code ADVANCED DIRECTIVES: N MOST FORM COMPLETE:  N HOSPICE EDUCATION PROVIDED:   N PPS:  Patient's appetite is normal per staff.  He propels himself around the unit. Duration of visit and documentation:  60 minutes.       Z , LCSW 

## 2017-10-27 DIAGNOSIS — I5030 Unspecified diastolic (congestive) heart failure: Secondary | ICD-10-CM | POA: Diagnosis not present

## 2017-10-27 DIAGNOSIS — R05 Cough: Secondary | ICD-10-CM | POA: Diagnosis not present

## 2017-10-27 DIAGNOSIS — F329 Major depressive disorder, single episode, unspecified: Secondary | ICD-10-CM | POA: Diagnosis not present

## 2017-11-10 DIAGNOSIS — B029 Zoster without complications: Secondary | ICD-10-CM | POA: Diagnosis not present

## 2017-11-12 DIAGNOSIS — B029 Zoster without complications: Secondary | ICD-10-CM | POA: Diagnosis not present

## 2017-11-17 ENCOUNTER — Non-Acute Institutional Stay: Payer: Medicare Other | Admitting: Licensed Clinical Social Worker

## 2017-11-17 DIAGNOSIS — Z515 Encounter for palliative care: Secondary | ICD-10-CM

## 2017-11-17 NOTE — Progress Notes (Signed)
COMMUNITY PALLIATIVE CARE SW NOTE  PATIENT NAME: Sergei Delo DOB: 03-20-1925 MRN: [id number]  PRIMARY CARE PROVIDER: Jodi Marble, MD  RESPONSIBLE PARTY:  Acct ID - Guarantor Home Phone Work Phone Relationship Acct Type  [id number] KIRON, OSMUN [phone]  Self P/F     [place], [place], [place] [address]     PLAN OF CARE and INTERVENTIONS:             1. GOALS OF CARE/ ADVANCE CARE PLANNING:  Goal is for patient to remain at the facility.  He is a full code. 2. SOCIAL/EMOTIONAL/SPIRITUAL ASSESSMENT/ INTERVENTIONS:  SW met with patient on the skilled unit at [facility].  He was in his w/c outside of his room.  He stated he was getting ready to go back to bed, but needed staff assistance.  He denied pain.  He did not initiate conversation, but answered SW questions.  He talked about his daughters.  SW provided active listening and supportive counseling.  Consulted facility CNA, [place], who expressed no needs at this time. 3. PATIENT/CAREGIVER EDUCATION/ COPING:  Patient copes by expressing his feelings openly. 4. PERSONAL EMERGENCY PLAN:  Per facility protocol. 5. COMMUNITY RESOURCES COORDINATION/ HEALTH CARE NAVIGATION:  None 6. FINANCIAL/LEGAL CONCERNS/INTERVENTIONS:  None     SOCIAL HX:  Social History   Tobacco Use  . Smoking status: Former Smoker    Packs/day: 1.00    Years: 20.00    Pack years: 20.00    Types: Cigarettes    Last attempt to quit: 01/12/1962    Years since quitting: 55.8  . Smokeless tobacco: Never Used  Substance Use Topics  . Alcohol use: No    Alcohol/week: 0.0 standard drinks    Comment: 05/08/2014 "he's been in [place] for ~!35 yrs"    CODE STATUS:  Full code.  ADVANCED DIRECTIVES: N MOST FORM COMPLETE:  N HOSPICE EDUCATION PROVIDED:  N PPS: Patient's appetite is normal.  He propels himself around the unit. Duration of visit and documentation:  60 minutes.      Creola Corn Nyoka Alcoser, LCSW

## 2017-11-24 DIAGNOSIS — D649 Anemia, unspecified: Secondary | ICD-10-CM | POA: Diagnosis not present

## 2017-11-24 DIAGNOSIS — I5032 Chronic diastolic (congestive) heart failure: Secondary | ICD-10-CM | POA: Diagnosis not present

## 2017-11-25 ENCOUNTER — Encounter: Payer: Self-pay | Admitting: Cardiovascular Disease

## 2017-11-25 ENCOUNTER — Ambulatory Visit (INDEPENDENT_AMBULATORY_CARE_PROVIDER_SITE_OTHER): Payer: Medicare Other | Admitting: Cardiovascular Disease

## 2017-11-25 VITALS — BP 124/72 | HR 44 | Ht 71.0 in | Wt 194.0 lb

## 2017-11-25 DIAGNOSIS — E7800 Pure hypercholesterolemia, unspecified: Secondary | ICD-10-CM

## 2017-11-25 DIAGNOSIS — Z7901 Long term (current) use of anticoagulants: Secondary | ICD-10-CM

## 2017-11-25 DIAGNOSIS — I5032 Chronic diastolic (congestive) heart failure: Secondary | ICD-10-CM

## 2017-11-25 DIAGNOSIS — T50905A Adverse effect of unspecified drugs, medicaments and biological substances, initial encounter: Secondary | ICD-10-CM

## 2017-11-25 DIAGNOSIS — I2510 Atherosclerotic heart disease of native coronary artery without angina pectoris: Secondary | ICD-10-CM

## 2017-11-25 DIAGNOSIS — R001 Bradycardia, unspecified: Secondary | ICD-10-CM | POA: Diagnosis not present

## 2017-11-25 DIAGNOSIS — I25118 Atherosclerotic heart disease of native coronary artery with other forms of angina pectoris: Secondary | ICD-10-CM | POA: Diagnosis not present

## 2017-11-25 DIAGNOSIS — Z9861 Coronary angioplasty status: Secondary | ICD-10-CM | POA: Diagnosis not present

## 2017-11-25 DIAGNOSIS — I480 Paroxysmal atrial fibrillation: Secondary | ICD-10-CM | POA: Diagnosis not present

## 2017-11-25 DIAGNOSIS — I10 Essential (primary) hypertension: Secondary | ICD-10-CM

## 2017-11-25 NOTE — Patient Instructions (Signed)
Medication Instructions:  Dr [name] has recommended making the following medication changes: 1. STOP Diltiazem  Your physician has requested that you regularly monitor your blood pressure at home. Please use the same machine to check your blood pressure daily. Keep a record of your blood pressures using the log sheet provided. In 1 week, please report your readings back to Dr C. You may use our online patient portal 'MyChart' or you can call the office to speak with a nurse.  If you need a refill on your cardiac medications before your next appointment, please call your pharmacy.   Follow-Up: Dr [name] recommends that you schedule a follow-up appointment in 3 months.

## 2017-11-25 NOTE — Progress Notes (Signed)
Patient ID: [name] [name], male   DOB: March 15, 1925, 84 y.o.   MRN: [id number]    Cardiology Office Note    Date:  11/25/2017   ID:  [name] [name], DOB [birth year], MRN [id number]  PCP:  Jodi Marble, MD  Cardiologist:   [name], MD   Chief Complaint  Patient presents with  . Follow-up CAD, 18 months s/p PCI-DES    No complaints    History of Present Illness:  [name] [name] is a 84 y.o. male with CAD, returning for follow up after 3 consecutive hospitalizations in July and August 2019, started with COPD exacerbation/heart failure/atrial fibrillation with RVR/urinary retention with acute renal insufficiency/encephalopathy; most recent hospitalization for acute on chronic diastolic heart failure in August 2019.   He is now at [facility] facility.  Things have stabilized over the last 3 months.  He is on a weight adjusted dose of diuretics and this seems to be working quite well.  He has not had any more problems with heart failure exacerbation, renal parameters have been within normal range (creatinine 0.8 in September, 1.21 on November 13, potassium 4.2).  His weight has been very steady in 188-192 pounds range on furosemide 80 mg daily, with an extra dose of afternoon furosemide 80 mg being administered if his weight is over 190 pounds (happens about 3-4 times a week).  Today he is not in atrial fibrillation, but has marked sinus bradycardia, 44-48 bpm,  asymptomatic with it.  She gets vital signs checked about once a week at [facility] and usually his ventricular rate is around 50-55, occasionally in the 70s.  He has not had tachycardia.  His blood pressure today is excellent at 124/72, rarely elevated to the 140s at the nursing facility.  As before his activities primarily limited by severe neuropathy and unsteady gait. The patient specifically denies any chest pain at rest or with exertion, dyspnea at rest or with exertion, orthopnea, paroxysmal nocturnal dyspnea,  syncope, palpitations, focal neurological deficits, intermittent claudication, lower extremity edema, unexplained weight gain, cough, hemoptysis or wheezing.  Cardiac cath on May 08, 2014 showed normal left ventricle systolic function and mild distal anterolateral hypokinesis. He had chronic total occlusion of the proximal LAD with distal filling via left to left and right-to-left collaterals. Mild to moderate lesions were seen in the circumflex and right coronary artery system and a new 95% stenosis in the distal right coronary artery just proximal to the ostium of the PDA with jeopardy to the LAD collaterals. He received a drug-eluting stent (3.518 mm Xience Alpine) to the right coronary artery.  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  Attempts to wean off isosorbide led to recurrence of chest discomfort.    Past Medical History:  Diagnosis Date  . Anginal pain ([place])   . Arthritis    "all over"  . Cancer ([place])   . CHF (congestive heart failure) ([place])   . Coronary artery disease   . Dementia ([place])   . Depression ~ 12; ~ 1990   PTSD/[place]ia   . GERD (gastroesophageal reflux disease)   . High cholesterol   . Hypertension   . Hyperthyroidism   . Neuropathy   . Osteoporosis     Past Surgical History:  Procedure Laterality Date  . BACK SURGERY    . CARDIAC CATHETERIZATION  09/2007  . CARDIAC CATHETERIZATION N/A 05/08/2014   Procedure: CORONARY STENT INTERVENTION;  Surgeon:  [name], MD; 3.518 mm Xience Alpine DES to the RCA   . LEFT HEART CATHETERIZATION WITH CORONARY ANGIOGRAM N/A 05/08/2014   Procedure: LEFT HEART CATHETERIZATION WITH CORONARY ANGIOGRAM;  Surgeon: [name], MD; LAD 100% (old), CFX 20%, OM1 30%, pRCA 30%, dRCA 95%, EF 55%     . LUMBAR LAMINECTOMY/DECOMPRESSION MICRODISCECTOMY  2011    Outpatient Medications Prior to Visit  Medication Sig Dispense Refill  . acetaminophen  (TYLENOL) 500 MG tablet Take 500-1,000 mg by mouth every 6 (six) hours as needed for mild pain.     [name] acyclovir (ZOVIRAX) 800 MG tablet Take 800 mg by mouth 4 (four) times daily.    [name] apixaban (ELIQUIS) 5 MG TABS tablet Take 1 tablet (5 mg total) by mouth 2 (two) times daily. 180 tablet 3  . Carboxymethylcellulose Sod PF 1 % GEL Place 1 drop into both eyes 2 (two) times daily.     . Cyanocobalamin (B-12 COMPLIANCE INJECTION) 1000 MCG/ML KIT Inject 1,000 mcg as directed See admin instructions. On the 20th of every month    . dextromethorphan-guaiFENesin (MUCINEX DM) 30-600 MG 12hr tablet Take 2 tablets by mouth 2 (two) times daily. 20 tablet 0  . diltiazem (CARDIZEM CD) 240 MG 24 hr capsule Take 1 capsule (240 mg total) by mouth daily. 30 capsule 0  . Emollient [facility]) OINT Apply 1 application topically 3 (three) times daily. To penis    . finasteride (PROSCAR) 5 MG tablet Take 5 mg by mouth daily.    . furosemide (LASIX) 80 MG tablet Take 1 tablet (80 mg total) by mouth daily. 60 tablet 1  . isosorbide mononitrate (IMDUR) 30 MG 24 hr tablet Take 30 mg by mouth daily.    [name] levalbuterol (XOPENEX) 1.25 MG/0.5ML nebulizer solution Take 1.25 mg by nebulization 3 (three) times daily. (Patient taking differently: Take 1.25 mg by nebulization every 4 (four) hours. ) 1 each 12  . lisinopril (PRINIVIL,ZESTRIL) 20 MG tablet Take 1 tablet (20 mg total) by mouth daily.    [name] loratadine (CLARITIN) 10 MG tablet Take 1 tablet (10 mg total) by mouth daily. 30 tablet 3  . meclizine (ANTIVERT) 12.5 MG tablet Take 12.5 mg by mouth every 4 (four) hours as needed for dizziness.    . metoprolol (LOPRESSOR) 100 MG tablet Take 100 mg by mouth 2 (two) times daily.    . Multiple Vitamins-Minerals (PRESERVISION AREDS 2+MULTI VIT) CAPS Take 1 capsule by mouth 2 (two) times daily.     [name] nystatin (NYSTATIN) powder Apply topically 3 (three) times daily. Affected areas on groin and buttocks    . pantoprazole (PROTONIX) 40 MG  tablet Take 40 mg by mouth daily.    . polyethylene glycol (MIRALAX / GLYCOLAX) packet Take 17 g by mouth 2 (two) times daily. 14 each 0  . ranitidine (ZANTAC) 300 MG tablet Take 300 mg by mouth at bedtime.    . tamsulosin (FLOMAX) 0.4 MG CAPS capsule Take 1 capsule (0.4 mg total) by mouth daily after supper. 30 capsule 0  . tiotropium (SPIRIVA HANDIHALER) 18 MCG inhalation capsule Place 1 capsule (18 mcg total) into inhaler and inhale daily. 30 capsule 2  . traZODone (DESYREL) 50 MG tablet Take 50 mg by mouth at bedtime.     . Vitamin D, Cholecalciferol, 1000 units TABS Take 1,000 Units by mouth at bedtime.     No facility-administered medications prior to visit.      Allergies:   Cortisone   Social History  Socioeconomic History  . Marital status: Divorced    Spouse name: Not on file  . Number of children: 3  . Years of education: unknown  . Highest education level: Not on file  Occupational History  . Not on file  Social Needs  . Financial resource strain: Not on file  . Food insecurity:    Worry: Not on file    Inability: Not on file  . Transportation needs:    Medical: Not on file    Non-medical: Not on file  Tobacco Use  . Smoking status: Former Smoker    Packs/day: 1.00    Years: 20.00    Pack years: 20.00    Types: Cigarettes    Last attempt to quit: 01/12/1962    Years since quitting: 55.9  . Smokeless tobacco: Never Used  Substance and Sexual Activity  . Alcohol use: No    Alcohol/week: 0.0 standard drinks    Comment: 05/08/2014 "he's been in [place] for ~!35 yrs"  . Drug use: No  . Sexual activity: Not on file  Lifestyle  . Physical activity:    Days per week: Not on file    Minutes per session: Not on file  . Stress: Not on file  Relationships  . Social connections:    Talks on phone: Not on file    Gets together: Not on file    Attends religious service: Not on file    Active member of club or organization: Not on file    Attends meetings of clubs or  organizations: Not on file    Relationship status: Not on file  Other Topics Concern  . Not on file  Social History Narrative   He was in [name] youth camps as a child. He emigrated to the [place] after World War II. He was staying with his host family in [place] when he got drafted for [place].      09/23/17 He now lives in [place], [place] and his daughter helps in his care.       ROS:   Please see the history of present illness.    ROS All other systems reviewed and are negative.   PHYSICAL EXAM:   VS:  BP 124/72   Pulse (!) 44   Ht '5\' 11"'$  (1.803 m)   Wt 194 lb (88 kg)   BMI 27.06 kg/m     General: Alert, oriented x3, no distress, in a wheelchair, appears well, Head: no evidence of trauma, PERRL, EOMI, no exophtalmos or lid lag, no myxedema, no xanthelasma; normal ears, nose and oropharynx Neck: normal jugular venous pulsations and no hepatojugular reflux; brisk carotid pulses without delay and no carotid bruits Chest: clear to auscultation, no signs of consolidation by percussion or palpation, normal fremitus, symmetrical and full respiratory excursions Cardiovascular: normal position and quality of the apical impulse, slow regular rhythm, normal first and second heart sounds, no murmurs, rubs or gallops Abdomen: no tenderness or distention, no masses by palpation, no abnormal pulsatility or arterial bruits, normal bowel sounds, no hepatosplenomegaly Extremities: no clubbing, cyanosis or edema; 2+ radial, ulnar and brachial pulses bilaterally; 2+ right femoral, posterior tibial and dorsalis pedis pulses; 2+ left femoral, posterior tibial and dorsalis pedis pulses; no subclavian or femoral bruits Neurological: grossly nonfocal, very hard of hearing Psych: Normal mood and affect  Wt Readings from Last 3 Encounters:  11/25/17 194 lb (88 kg)  09/23/17 188 lb (85.3 kg)  09/16/17 191 lb (86.6 kg)  Studies/Labs Reviewed:   EKG:  EKG is ordered today.   This shows sinus bradycardia, incomplete right bundle branch block (QRS 114 ms), no repolarization changes that would suggest ischemia, QTC 434 ms   [facility] on March 28 show  hemoglobin A1c 6%, creatinine 0.9, potassium 3.9, normal liver function tests total cholesterol 119, triglycerides 64, HDL 45, LDL 61  Lipid Panel    Component Value Date/Time   CHOL 111 05/10/2014 1015   TRIG 60 05/10/2014 1015   HDL 30 (L) 05/10/2014 1015   CHOLHDL 3.7 05/10/2014 1015   VLDL 12 05/10/2014 1015   LDLCALC 69 05/10/2014 1015     ASSESSMENT:    1. Bradycardia, drug induced   2. Paroxysmal atrial fibrillation (HCC)   3. Long term current use of anticoagulant   4. Chronic diastolic heart failure ([place])   5. Coronary artery disease of native artery of native heart with stable angina pectoris ([place])   6. Essential hypertension   7. Hypercholesterolemia      PLAN:  In order of problems listed above:  1. Sinus bradycardia: Stop the diltiazem, continue metoprolol, reevaluate blood pressure and heart rate next week. 2. AFib: He does not have a history of stroke or TIA but has high embolic risk.  CHADSVasc 5 (age 49, HTN, CAD, CHF).   He is mostly sedentary or in a wheelchair and has not experienced falls, bleeding, injuries.  3. Eliquis: So far well-tolerated. 4. CHF: Clinically appears to be euvolemic.  His target dry weight 185-190 pounds.  I think we will continue the current weight-based furosemide dosing strategy.  We might be able to simplify it to a regimen of 80 mg most days of the week and 80 mg twice daily on 3 days a week if weighing him becomes cumbersome more possible. 5. CAD: Currently angina is well controlled on beta-blocker and long-acting nitrate.  Hopefully he will not have recurrent angina when we discontinue his diltiazem.[name] Has chronic occlusion of the LAD artery and a stent placed in the right coronary artery 2 years ago.  6. HTN: Chronic problems with neuropathy, low  diastolic blood pressure symptoms of orthostatic hypotension.  I would avoid "perfect" control of his systolic blood pressure to avoid issues with orthostatic hypotension.  I think we can tolerate systolic blood pressure up to about 150-160 occasionally. 7. HLP: Lipid profile earlier this year was excellent.    Medication Adjustments/Labs and Tests Ordered: Current medicines are reviewed at length with the patient today.  Concerns regarding medicines are outlined above.  Medication changes, Labs and Tests ordered today are listed in the Patient Instructions below. Patient Instructions  Medication Instructions:  Dr [name] has recommended making the following medication changes: 1. STOP Diltiazem  Your physician has requested that you regularly monitor your blood pressure at home. Please use the same machine to check your blood pressure daily. Keep a record of your blood pressures using the log sheet provided. In 1 week, please report your readings back to Dr C. You may use our online patient portal 'MyChart' or you can call the office to speak with a nurse.  If you need a refill on your cardiac medications before your next appointment, please call your pharmacy.   Follow-Up: Dr [name] recommends that you schedule a follow-up appointment in 3 months.      Signed, [name], MD  11/25/2017 6:35 PM    [address], [place], [place]  [address] Phone: [phone]; Fax: [phone]

## 2017-11-26 NOTE — Addendum Note (Signed)
Addended by: Diana Eves on: 11/26/2017 06:01 PM   Modules accepted: Orders

## 2017-12-02 DIAGNOSIS — J449 Chronic obstructive pulmonary disease, unspecified: Secondary | ICD-10-CM | POA: Diagnosis not present

## 2017-12-02 DIAGNOSIS — I5030 Unspecified diastolic (congestive) heart failure: Secondary | ICD-10-CM | POA: Diagnosis not present

## 2017-12-02 DIAGNOSIS — I2510 Atherosclerotic heart disease of native coronary artery without angina pectoris: Secondary | ICD-10-CM | POA: Diagnosis not present

## 2017-12-02 DIAGNOSIS — I480 Paroxysmal atrial fibrillation: Secondary | ICD-10-CM | POA: Diagnosis not present

## 2017-12-07 ENCOUNTER — Telehealth: Payer: Self-pay | Admitting: Cardiovascular Disease

## 2017-12-07 NOTE — Telephone Encounter (Signed)
Daughter- [name] [name]  is calling- to report  Her fathers weight at  snf [facility] [place] has been ranging 191 - 194 lbs since last vist on 11/25/17  per daughter patient has been receiving lasix 80 mg twice a day.  1) she wanted to know the parameter on  How many lasix a day . 2) what time a day should mediction be given.     [name] [name]  aware will defer to dr croitoru   Please contact - daughter - [name] [name] and call verbal order to [facility]  [phone])  ASK TO Ruso RN   - PATIENT IS in  [place].

## 2017-12-07 NOTE — Telephone Encounter (Signed)
New message    Pt daughter is calling stating that pt weight is over 190 for 2 weeks.    Pt c/o swelling: STAT is pt has developed SOB within 24 hours  1) How much weight have you gained and in what time span?   2) If swelling, where is the swelling located? No swelling in legs. Pt daughter states his fluid collects around his heart and lungs  3) Are you currently taking a fluid pill? Yes lasix, and pt has been given extra lasix.   4) Are you currently SOB? No per daugther   5) Do you have a log of your daily weights (if so, list)?   6) Have you gained 3 pounds in a day or 5 pounds in a week? Pt has been over 190 lbs for 2 weeks.  7) Have you traveled recently? No

## 2017-12-08 NOTE — Telephone Encounter (Signed)
Pt's daughter updated with Dr. Lurline Del recommendation. Daughter verbalized understanding and requesting facility (n[facility]) be updated.  Attempted to contact facility x 2. Was left on hold the first time and line disconnected the second.

## 2017-12-08 NOTE — Telephone Encounter (Signed)
It sounds like he needs to stay on furosemide 80 mg twice daily and it should be administered in the morning (8-10 AM) and mid afternoon (2-4 PM), never later than 6 hours before bedtime. Please call us if weight exceeds 195 lb. MCr

## 2017-12-08 NOTE — Telephone Encounter (Signed)
  Patient's daughter [name] is calling back to see if Dr [name] had answered her question from yesterday regarding Mr Hunt medication.

## 2017-12-14 MED ORDER — METOLAZONE 2.5 MG PO TABS: 2.5000 mg | ORAL_TABLET | Freq: Once | ORAL | 0 refills | Status: DC

## 2017-12-14 MED ORDER — FUROSEMIDE 80 MG PO TABS: 160.0000 mg | ORAL_TABLET | Freq: Every day | ORAL | 3 refills | Status: DC

## 2017-12-14 NOTE — Telephone Encounter (Signed)
Spoke to [employer]-   Information given per Dr [employer] instructions, Caryl Asp states patient has not been  Below  190 lbs  In about one month - weight is arrange  192 - 195 lbs daily and been receiving  160 mg every morning   ( 80 mg plus another 80 mg due to the patient over his dry weight.)     information was given to Dr. [name] order received and changed. Per Dr. [name]- change to 160 mg lasix daily  One time dose for tomorrow 12/15/17-- 2.5 mg metalozone 30 min prior to lasix , BMP on Thursday send results ( fax number [email or web address]. Contact office if weight is 195 lbsor higher  JOY, RN VERBALIZED UNDERSTANDING VERBAL ORDER.

## 2017-12-14 NOTE — Addendum Note (Signed)
Addended by: Raiford Simmonds on: 12/14/2017 11:41 AM   Modules accepted: Orders

## 2017-12-16 ENCOUNTER — Encounter: Payer: Self-pay | Admitting: Cardiovascular Disease

## 2017-12-16 DIAGNOSIS — D649 Anemia, unspecified: Secondary | ICD-10-CM | POA: Diagnosis not present

## 2017-12-16 DIAGNOSIS — I5032 Chronic diastolic (congestive) heart failure: Secondary | ICD-10-CM | POA: Diagnosis not present

## 2017-12-24 DIAGNOSIS — I10 Essential (primary) hypertension: Secondary | ICD-10-CM | POA: Diagnosis not present

## 2017-12-24 DIAGNOSIS — I2510 Atherosclerotic heart disease of native coronary artery without angina pectoris: Secondary | ICD-10-CM | POA: Diagnosis not present

## 2017-12-24 DIAGNOSIS — J449 Chronic obstructive pulmonary disease, unspecified: Secondary | ICD-10-CM | POA: Diagnosis not present

## 2017-12-24 DIAGNOSIS — I5032 Chronic diastolic (congestive) heart failure: Secondary | ICD-10-CM | POA: Diagnosis not present

## 2017-12-24 DIAGNOSIS — R1312 Dysphagia, oropharyngeal phase: Secondary | ICD-10-CM | POA: Diagnosis not present

## 2017-12-24 DIAGNOSIS — I5030 Unspecified diastolic (congestive) heart failure: Secondary | ICD-10-CM | POA: Diagnosis not present

## 2017-12-24 DIAGNOSIS — R41841 Cognitive communication deficit: Secondary | ICD-10-CM | POA: Diagnosis not present

## 2017-12-27 DIAGNOSIS — R1312 Dysphagia, oropharyngeal phase: Secondary | ICD-10-CM | POA: Diagnosis not present

## 2017-12-27 DIAGNOSIS — R41841 Cognitive communication deficit: Secondary | ICD-10-CM | POA: Diagnosis not present

## 2017-12-27 DIAGNOSIS — I5032 Chronic diastolic (congestive) heart failure: Secondary | ICD-10-CM | POA: Diagnosis not present

## 2017-12-28 DIAGNOSIS — I5032 Chronic diastolic (congestive) heart failure: Secondary | ICD-10-CM | POA: Diagnosis not present

## 2017-12-28 DIAGNOSIS — R41841 Cognitive communication deficit: Secondary | ICD-10-CM | POA: Diagnosis not present

## 2017-12-28 DIAGNOSIS — R1312 Dysphagia, oropharyngeal phase: Secondary | ICD-10-CM | POA: Diagnosis not present

## 2017-12-30 DIAGNOSIS — I5030 Unspecified diastolic (congestive) heart failure: Secondary | ICD-10-CM | POA: Diagnosis not present

## 2017-12-30 DIAGNOSIS — J449 Chronic obstructive pulmonary disease, unspecified: Secondary | ICD-10-CM | POA: Diagnosis not present

## 2017-12-30 DIAGNOSIS — I5032 Chronic diastolic (congestive) heart failure: Secondary | ICD-10-CM | POA: Diagnosis not present

## 2017-12-30 DIAGNOSIS — I2510 Atherosclerotic heart disease of native coronary artery without angina pectoris: Secondary | ICD-10-CM | POA: Diagnosis not present

## 2017-12-30 DIAGNOSIS — I10 Essential (primary) hypertension: Secondary | ICD-10-CM | POA: Diagnosis not present

## 2017-12-30 DIAGNOSIS — R41841 Cognitive communication deficit: Secondary | ICD-10-CM | POA: Diagnosis not present

## 2017-12-30 DIAGNOSIS — R1312 Dysphagia, oropharyngeal phase: Secondary | ICD-10-CM | POA: Diagnosis not present

## 2017-12-31 DIAGNOSIS — R1312 Dysphagia, oropharyngeal phase: Secondary | ICD-10-CM | POA: Diagnosis not present

## 2017-12-31 DIAGNOSIS — I5032 Chronic diastolic (congestive) heart failure: Secondary | ICD-10-CM | POA: Diagnosis not present

## 2017-12-31 DIAGNOSIS — R41841 Cognitive communication deficit: Secondary | ICD-10-CM | POA: Diagnosis not present

## 2018-01-01 DIAGNOSIS — R1312 Dysphagia, oropharyngeal phase: Secondary | ICD-10-CM | POA: Diagnosis not present

## 2018-01-01 DIAGNOSIS — R41841 Cognitive communication deficit: Secondary | ICD-10-CM | POA: Diagnosis not present

## 2018-01-01 DIAGNOSIS — I5032 Chronic diastolic (congestive) heart failure: Secondary | ICD-10-CM | POA: Diagnosis not present

## 2018-01-02 DIAGNOSIS — I5032 Chronic diastolic (congestive) heart failure: Secondary | ICD-10-CM | POA: Diagnosis not present

## 2018-01-02 DIAGNOSIS — R41841 Cognitive communication deficit: Secondary | ICD-10-CM | POA: Diagnosis not present

## 2018-01-02 DIAGNOSIS — R1312 Dysphagia, oropharyngeal phase: Secondary | ICD-10-CM | POA: Diagnosis not present

## 2018-01-03 ENCOUNTER — Non-Acute Institutional Stay: Payer: Medicare Other | Admitting: Licensed Clinical Social Worker

## 2018-01-03 DIAGNOSIS — I5032 Chronic diastolic (congestive) heart failure: Secondary | ICD-10-CM | POA: Diagnosis not present

## 2018-01-03 DIAGNOSIS — R1312 Dysphagia, oropharyngeal phase: Secondary | ICD-10-CM | POA: Diagnosis not present

## 2018-01-03 DIAGNOSIS — R41841 Cognitive communication deficit: Secondary | ICD-10-CM | POA: Diagnosis not present

## 2018-01-03 DIAGNOSIS — Z515 Encounter for palliative care: Secondary | ICD-10-CM

## 2018-01-04 DIAGNOSIS — R1312 Dysphagia, oropharyngeal phase: Secondary | ICD-10-CM | POA: Diagnosis not present

## 2018-01-04 DIAGNOSIS — R41841 Cognitive communication deficit: Secondary | ICD-10-CM | POA: Diagnosis not present

## 2018-01-04 DIAGNOSIS — I5032 Chronic diastolic (congestive) heart failure: Secondary | ICD-10-CM | POA: Diagnosis not present

## 2018-01-07 DIAGNOSIS — I5032 Chronic diastolic (congestive) heart failure: Secondary | ICD-10-CM | POA: Diagnosis not present

## 2018-01-07 DIAGNOSIS — R41841 Cognitive communication deficit: Secondary | ICD-10-CM | POA: Diagnosis not present

## 2018-01-07 DIAGNOSIS — R1312 Dysphagia, oropharyngeal phase: Secondary | ICD-10-CM | POA: Diagnosis not present

## 2018-01-07 NOTE — Progress Notes (Signed)
COMMUNITY PALLIATIVE CARE SW NOTE  PATIENT NAME: [name] [name] DOB: 02/20/1925 MRN: [id number]  PRIMARY CARE PROVIDER: Jodi Marble, MD  RESPONSIBLE PARTY:  Acct ID - Guarantor Home Phone Work Phone Relationship Acct Type  [id number] [name], [name] [phone]  Self P/F     [address], [place], [place] [address]    PLAN OF CARE and INTERVENTIONS:             1. GOALS OF CARE/ ADVANCE CARE PLANNING:  Patient's goal is to remain in [name].  He is a full code. 2. SOCIAL/EMOTIONAL/SPIRITUAL ASSESSMENT/ INTERVENTIONS:  SW met with patient at [facility].  He was in his w/c attending an art activity.  He did not display any nonverbal indicators of pain.  He appeared to be following directions appropriately.  SW consulted the [employer], Caryl Asp.  Provided education regarding the Palliative Care program and she stated she understood. 3. PATIENT/CAREGIVER EDUCATION/ COPING:  Patient copes by expressing his feelings. 4. PERSONAL EMERGENCY PLAN:  Per facility protocol. 5. COMMUNITY RESOURCES COORDINATION/ HEALTH CARE NAVIGATION:  None 6. FINANCIAL/LEGAL CONCERNS/INTERVENTIONS:  None  SOCIAL HX:  Social History   Tobacco Use  . Smoking status: Former Smoker    Packs/day: 1.00    Years: 20.00    Pack years: 20.00    Types: Cigarettes    Last attempt to quit: 01/12/1962    Years since quitting: 56.0  . Smokeless tobacco: Never Used  Substance Use Topics  . Alcohol use: No    Alcohol/week: 0.0 standard drinks    Comment: 05/08/2014 "he's been in [place] for ~!35 yrs"   CODE STATUS:  Full code.  ADVANCED DIRECTIVES: N MOST FORM COMPLETE:  N HOSPICE EDUCATION PROVIDED:  N PPS: Patient's appetite is normal.  He propels himself in his w/c. Duration of visit and documentation:  50 minutes.      Creola Corn Carloyn Lahue, LCSW

## 2018-01-09 DIAGNOSIS — R41841 Cognitive communication deficit: Secondary | ICD-10-CM | POA: Diagnosis not present

## 2018-01-09 DIAGNOSIS — R1312 Dysphagia, oropharyngeal phase: Secondary | ICD-10-CM | POA: Diagnosis not present

## 2018-01-09 DIAGNOSIS — I5032 Chronic diastolic (congestive) heart failure: Secondary | ICD-10-CM | POA: Diagnosis not present

## 2018-01-10 DIAGNOSIS — R41841 Cognitive communication deficit: Secondary | ICD-10-CM | POA: Diagnosis not present

## 2018-01-10 DIAGNOSIS — I5032 Chronic diastolic (congestive) heart failure: Secondary | ICD-10-CM | POA: Diagnosis not present

## 2018-01-10 DIAGNOSIS — R1312 Dysphagia, oropharyngeal phase: Secondary | ICD-10-CM | POA: Diagnosis not present

## 2018-01-11 DIAGNOSIS — I5032 Chronic diastolic (congestive) heart failure: Secondary | ICD-10-CM | POA: Diagnosis not present

## 2018-01-11 DIAGNOSIS — R41841 Cognitive communication deficit: Secondary | ICD-10-CM | POA: Diagnosis not present

## 2018-01-11 DIAGNOSIS — R1312 Dysphagia, oropharyngeal phase: Secondary | ICD-10-CM | POA: Diagnosis not present

## 2018-01-13 DIAGNOSIS — R1312 Dysphagia, oropharyngeal phase: Secondary | ICD-10-CM | POA: Diagnosis not present

## 2018-01-13 DIAGNOSIS — I5032 Chronic diastolic (congestive) heart failure: Secondary | ICD-10-CM | POA: Diagnosis not present

## 2018-01-14 DIAGNOSIS — R1312 Dysphagia, oropharyngeal phase: Secondary | ICD-10-CM | POA: Diagnosis not present

## 2018-01-14 DIAGNOSIS — I5032 Chronic diastolic (congestive) heart failure: Secondary | ICD-10-CM | POA: Diagnosis not present

## 2018-01-15 DIAGNOSIS — I5032 Chronic diastolic (congestive) heart failure: Secondary | ICD-10-CM | POA: Diagnosis not present

## 2018-01-15 DIAGNOSIS — R1312 Dysphagia, oropharyngeal phase: Secondary | ICD-10-CM | POA: Diagnosis not present

## 2018-01-18 DIAGNOSIS — I5032 Chronic diastolic (congestive) heart failure: Secondary | ICD-10-CM | POA: Diagnosis not present

## 2018-01-18 DIAGNOSIS — R1312 Dysphagia, oropharyngeal phase: Secondary | ICD-10-CM | POA: Diagnosis not present

## 2018-01-19 DIAGNOSIS — I5032 Chronic diastolic (congestive) heart failure: Secondary | ICD-10-CM | POA: Diagnosis not present

## 2018-01-19 DIAGNOSIS — R1312 Dysphagia, oropharyngeal phase: Secondary | ICD-10-CM | POA: Diagnosis not present

## 2018-01-21 DIAGNOSIS — I5032 Chronic diastolic (congestive) heart failure: Secondary | ICD-10-CM | POA: Diagnosis not present

## 2018-01-21 DIAGNOSIS — R1312 Dysphagia, oropharyngeal phase: Secondary | ICD-10-CM | POA: Diagnosis not present

## 2018-01-27 DIAGNOSIS — I10 Essential (primary) hypertension: Secondary | ICD-10-CM | POA: Diagnosis not present

## 2018-01-27 DIAGNOSIS — I2510 Atherosclerotic heart disease of native coronary artery without angina pectoris: Secondary | ICD-10-CM | POA: Diagnosis not present

## 2018-01-27 DIAGNOSIS — I5030 Unspecified diastolic (congestive) heart failure: Secondary | ICD-10-CM | POA: Diagnosis not present

## 2018-01-27 DIAGNOSIS — J449 Chronic obstructive pulmonary disease, unspecified: Secondary | ICD-10-CM | POA: Diagnosis not present

## 2018-01-31 ENCOUNTER — Non-Acute Institutional Stay: Payer: Medicare Other | Admitting: Licensed Clinical Social Worker

## 2018-01-31 ENCOUNTER — Telehealth: Payer: Self-pay | Admitting: Licensed Clinical Social Worker

## 2018-01-31 DIAGNOSIS — Z515 Encounter for palliative care: Secondary | ICD-10-CM

## 2018-01-31 NOTE — Telephone Encounter (Signed)
SW left a vm with patient's daughter, Lorn Junes, informing her of Palliative Care services for her father.

## 2018-02-01 DIAGNOSIS — I10 Essential (primary) hypertension: Secondary | ICD-10-CM | POA: Diagnosis not present

## 2018-02-01 DIAGNOSIS — I480 Paroxysmal atrial fibrillation: Secondary | ICD-10-CM | POA: Diagnosis not present

## 2018-02-01 DIAGNOSIS — I2510 Atherosclerotic heart disease of native coronary artery without angina pectoris: Secondary | ICD-10-CM | POA: Diagnosis not present

## 2018-02-01 DIAGNOSIS — J449 Chronic obstructive pulmonary disease, unspecified: Secondary | ICD-10-CM | POA: Diagnosis not present

## 2018-02-01 NOTE — Progress Notes (Signed)
COMMUNITY PALLIATIVE CARE SW NOTE  PATIENT NAME: [name] [name] DOB: Jul 14, 1925 MRN: [id number]  PRIMARY CARE PROVIDER: Jodi Marble, MD  RESPONSIBLE PARTY:  Acct ID - Guarantor Home Phone Work Phone Relationship Acct Type  [id number] [name], [name] [phone]  Self P/F     [address], [place], [place] [address]     PLAN OF CARE and INTERVENTIONS:             1. GOALS OF CARE/ ADVANCE CARE PLANNING:  Goal is for patient to remain at the facility.  Patient is a full code. 2. SOCIAL/EMOTIONAL/SPIRITUAL ASSESSMENT/ INTERVENTIONS:  SW met with patient at [facility].  He was propelling himself in his w/c down the hall.  He was minimally engaged.  He did not recall SW from previous visits.  He said he was going to the dining room and propelled himself there.  SW consulted facility SW, Judson Roch, who stated patient is very active at the facility.  He does not spend much time in his room and keeps to a regimented schedule. 3. PATIENT/CAREGIVER EDUCATION/ COPING:  Patient expresses his feelings openly.  If he does not wish to engage in communication, he propels himself away in his w/c. 4. PERSONAL EMERGENCY PLAN:  Per facility protocol. 5. COMMUNITY RESOURCES COORDINATION/ HEALTH CARE NAVIGATION:  None. 6. FINANCIAL/LEGAL CONCERNS/INTERVENTIONS:  None.     SOCIAL HX:  Social History   Tobacco Use  . Smoking status: Former Smoker    Packs/day: 1.00    Years: 20.00    Pack years: 20.00    Types: Cigarettes    Last attempt to quit: 01/12/1962    Years since quitting: 56.0  . Smokeless tobacco: Never Used  Substance Use Topics  . Alcohol use: No    Alcohol/week: 0.0 standard drinks    Comment: 05/08/2014 "he's been in [place] for ~!35 yrs"    CODE STATUS:  Full Code  ADVANCED DIRECTIVES: N MOST FORM COMPLETE:  N HOSPICE EDUCATION PROVIDED:  Patient's daughter works for [employer]. PPS:  Patient's appetite is normal.  He is able to propel himself around the unit in his w/c. Duration of  visit and documentation:  45 minutes. Plan:  Follow up with patient's daughters to discuss code status.      Creola Corn Petros Ahart, LCSW

## 2018-02-24 DIAGNOSIS — I10 Essential (primary) hypertension: Secondary | ICD-10-CM | POA: Diagnosis not present

## 2018-02-24 DIAGNOSIS — I2510 Atherosclerotic heart disease of native coronary artery without angina pectoris: Secondary | ICD-10-CM | POA: Diagnosis not present

## 2018-02-24 DIAGNOSIS — I480 Paroxysmal atrial fibrillation: Secondary | ICD-10-CM | POA: Diagnosis not present

## 2018-02-24 DIAGNOSIS — J449 Chronic obstructive pulmonary disease, unspecified: Secondary | ICD-10-CM | POA: Diagnosis not present

## 2018-02-25 ENCOUNTER — Ambulatory Visit: Payer: Self-pay | Admitting: Cardiovascular Disease

## 2018-03-01 ENCOUNTER — Ambulatory Visit: Payer: Self-pay | Admitting: Cardiology

## 2018-03-04 ENCOUNTER — Non-Acute Institutional Stay: Payer: Medicare Other | Admitting: Licensed Clinical Social Worker

## 2018-03-04 DIAGNOSIS — I5030 Unspecified diastolic (congestive) heart failure: Secondary | ICD-10-CM | POA: Diagnosis not present

## 2018-03-04 DIAGNOSIS — I10 Essential (primary) hypertension: Secondary | ICD-10-CM | POA: Diagnosis not present

## 2018-03-04 DIAGNOSIS — J449 Chronic obstructive pulmonary disease, unspecified: Secondary | ICD-10-CM | POA: Diagnosis not present

## 2018-03-04 DIAGNOSIS — I480 Paroxysmal atrial fibrillation: Secondary | ICD-10-CM | POA: Diagnosis not present

## 2018-03-07 NOTE — Progress Notes (Signed)
COMMUNITY PALLIATIVE CARE SW NOTE  PATIENT NAME: [name] [name] DOB: 07-13-1925 MRN: [id number]  PRIMARY CARE PROVIDER: Jodi Marble, MD  RESPONSIBLE PARTY:  Acct ID - Guarantor Home Phone Work Phone Relationship Acct Type  [id number] BENTLEY, FISSEL [phone]  Self P/F     [place], [place], [place] [address]     PLAN OF CARE and INTERVENTIONS:             1. GOALS OF CARE/ ADVANCE CARE PLANNING:  Goal for family is for patient to remain at the facility.  He is a full code. 2. SOCIAL/EMOTIONAL/SPIRITUAL ASSESSMENT/ INTERVENTIONS:  SW met with patient and his daughter, [name], in the [place].  They were playing a game.  Patient did not recognize SW from previous visits.  Daughter requested a MOST form which SW provided.  She stated she wished to further discuss the document with her sisters. 3. PATIENT/CAREGIVER EDUCATION/ COPING:  Patient copes by expressing his feelings openly.  SW provided education to patient's daughter regarding the Palliative Care program and she stated she understood. 4. PERSONAL EMERGENCY PLAN:  Per facility protocol. 5. COMMUNITY RESOURCES COORDINATION/ HEALTH CARE NAVIGATION:  None. 6. FINANCIAL/LEGAL CONCERNS/INTERVENTIONS:  None.   SOCIAL HX:  Social History   Tobacco Use  . Smoking status: Former Smoker    Packs/day: 1.00    Years: 20.00    Pack years: 20.00    Types: Cigarettes    Last attempt to quit: 01/12/1962    Years since quitting: 56.1  . Smokeless tobacco: Never Used  Substance Use Topics  . Alcohol use: No    Alcohol/week: 0.0 standard drinks    Comment: 05/08/2014 "he's been in [place] for ~!35 yrs"    CODE STATUS:  Full Code  ADVANCED DIRECTIVES: N MOST FORM COMPLETE:  N HOSPICE EDUCATION PROVIDED:  Patient's daughter works for [employer]. PPS:  Patient's appetite is normal.  He propels himself in his w/c. Duration of visit and documentation:  60 minutes.      Creola Corn Jenell Dobransky, LCSW

## 2018-03-08 DIAGNOSIS — R0602 Shortness of breath: Secondary | ICD-10-CM | POA: Diagnosis not present

## 2018-03-09 ENCOUNTER — Other Ambulatory Visit: Payer: Self-pay | Admitting: *Deleted

## 2018-03-09 ENCOUNTER — Telehealth: Payer: Self-pay | Admitting: Cardiovascular Disease

## 2018-03-09 DIAGNOSIS — I5033 Acute on chronic diastolic (congestive) heart failure: Secondary | ICD-10-CM

## 2018-03-09 DIAGNOSIS — I25118 Atherosclerotic heart disease of native coronary artery with other forms of angina pectoris: Secondary | ICD-10-CM

## 2018-03-09 NOTE — Telephone Encounter (Signed)
Pt's daughter aware order faxed to nursing home for bmet ./cy

## 2018-03-09 NOTE — Telephone Encounter (Signed)
New Message:    Patient daughter calling about what blood work needs to be done at the nursing home. Please call sdaughter.

## 2018-03-09 NOTE — Telephone Encounter (Signed)
It would be good if he can have a BMET drawn today so we can have the result by Friday. If this is not possible, we can draw it on Friday in the office. MCr

## 2018-03-09 NOTE — Telephone Encounter (Signed)
Pt has appt with Kerin Ransom PA on Friday.Daughter checking to see if  pt needs  BMET before appt or may have done after visit  FAX number to nursing home is [phone].Will forward to dr [name] for review ./cy

## 2018-03-10 DIAGNOSIS — D649 Anemia, unspecified: Secondary | ICD-10-CM | POA: Diagnosis not present

## 2018-03-10 DIAGNOSIS — I5032 Chronic diastolic (congestive) heart failure: Secondary | ICD-10-CM | POA: Diagnosis not present

## 2018-03-11 ENCOUNTER — Encounter: Payer: Self-pay | Admitting: Cardiology

## 2018-03-11 ENCOUNTER — Ambulatory Visit (INDEPENDENT_AMBULATORY_CARE_PROVIDER_SITE_OTHER): Payer: Medicare Other | Admitting: Cardiology

## 2018-03-11 VITALS — BP 119/67 | HR 59 | Ht 71.0 in | Wt 192.0 lb

## 2018-03-11 DIAGNOSIS — I25118 Atherosclerotic heart disease of native coronary artery with other forms of angina pectoris: Secondary | ICD-10-CM

## 2018-03-11 DIAGNOSIS — I5033 Acute on chronic diastolic (congestive) heart failure: Secondary | ICD-10-CM

## 2018-03-11 NOTE — Patient Instructions (Signed)
Medication Instructions:  Your physician recommends that you continue on your current medications as directed. Please refer to the Current Medication list given to you today.  If you need a refill on your cardiac medications before your next appointment, please call your pharmacy.   Lab work: None  If you have labs (blood work) drawn today and your tests are completely normal, you will receive your results only by: [name] MyChart Message (if you have MyChart) OR . A paper copy in the mail If you have any lab test that is abnormal or we need to change your treatment, we will call you to review the results.  Testing/Procedures: None   Follow-Up: At [facility], you and your health needs are our priority.  As part of our continuing mission to provide you with exceptional heart care, we have created designated Provider Care Teams.  These Care Teams include your primary Cardiologist (physician) and Advanced Practice Providers (APPs -  Physician Assistants and Nurse Practitioners) who all work together to provide you with the care you need, when you need it. . Your physician recommends that you schedule a follow-up appointment in: 3 months with [name], PA-C or Dr [name].  Any Other Special Instructions Will Be Listed Below (If Applicable).

## 2018-03-11 NOTE — Progress Notes (Signed)
03/11/2018 [name] [name]   20-Jan-1925  [id number]  Primary Physician Jodi Marble, MD Primary Cardiologist: dr [name]  HPI:  85 y/o [place] male, resident of [facility] here with his daughter [name] for follow up.  He has a history of chronic diastolic CHF. The pt has know CAD with a CTO of his LAD and s/p RCA PCI with DES in 2016. His last echo was 09/03/17 and showed his EF to be 60-65% with mild LAE, and a PA [pressure of 33 mmHg. He has PAF and is CHADS VASC=5, on Eliquis.  His goal weight previously had been mentioned to be 185-190 lbs but in reviewing his weights he has been less then 192 lbs since Dec 2019.  The attending physician at [facility] recently gave his a few days of increased Lasix for an increase in his weight to 197lbs and vascular congestion noted on CXR.  His weigh has come down to 192 lbs.  Symptomatically he denies increased dyspnea or orthopnea.  2   Current Outpatient Medications  Medication Sig Dispense Refill  . acetaminophen (TYLENOL) 500 MG tablet Take 500-1,000 mg by mouth every 6 (six) hours as needed for mild pain.     [name] acyclovir (ZOVIRAX) 800 MG tablet Take 800 mg by mouth 4 (four) times daily.    [name] apixaban (ELIQUIS) 5 MG TABS tablet Take 1 tablet (5 mg total) by mouth 2 (two) times daily. 180 tablet 3  . Carboxymethylcellulose Sod PF 1 % GEL Place 1 drop into both eyes 2 (two) times daily.     . Cyanocobalamin (B-12 COMPLIANCE INJECTION) 1000 MCG/ML KIT Inject 1,000 mcg as directed See admin instructions. On the 20th of every month    . dextromethorphan-guaiFENesin (MUCINEX DM) 30-600 MG 12hr tablet Take 2 tablets by mouth 2 (two) times daily. 20 tablet 0  . Emollient [facility]) OINT Apply 1 application topically 3 (three) times daily. To penis    . finasteride (PROSCAR) 5 MG tablet Take 5 mg by mouth daily.    . furosemide (LASIX) 80 MG tablet Take 2 tablets (160 mg total) by mouth daily. 90 tablet 3  . isosorbide mononitrate (IMDUR) 30 MG  24 hr tablet Take 30 mg by mouth daily.    [name] levalbuterol (XOPENEX) 1.25 MG/0.5ML nebulizer solution Take 1.25 mg by nebulization 3 (three) times daily. (Patient taking differently: Take 1.25 mg by nebulization every 4 (four) hours. ) 1 each 12  . lisinopril (PRINIVIL,ZESTRIL) 20 MG tablet Take 1 tablet (20 mg total) by mouth daily.    [name] loratadine (CLARITIN) 10 MG tablet Take 1 tablet (10 mg total) by mouth daily. 30 tablet 3  . meclizine (ANTIVERT) 12.5 MG tablet Take 12.5 mg by mouth every 4 (four) hours as needed for dizziness.    . metoprolol (LOPRESSOR) 100 MG tablet Take 100 mg by mouth 2 (two) times daily.    . Multiple Vitamins-Minerals (PRESERVISION AREDS 2+MULTI VIT) CAPS Take 1 capsule by mouth 2 (two) times daily.     [name] nystatin (NYSTATIN) powder Apply topically 3 (three) times daily. Affected areas on groin and buttocks    . pantoprazole (PROTONIX) 40 MG tablet Take 40 mg by mouth daily.    . polyethylene glycol (MIRALAX / GLYCOLAX) packet Take 17 g by mouth 2 (two) times daily. 14 each 0  . tamsulosin (FLOMAX) 0.4 MG CAPS capsule Take 1 capsule (0.4 mg total) by mouth daily after supper. 30 capsule 0  . traZODone (DESYREL) 50 MG tablet  Take 50 mg by mouth at bedtime.     . Vitamin D, Cholecalciferol, 1000 units TABS Take 1,000 Units by mouth at bedtime.    [name] tiotropium (SPIRIVA HANDIHALER) 18 MCG inhalation capsule Place 1 capsule (18 mcg total) into inhaler and inhale daily. 30 capsule 2   No current facility-administered medications for this visit.     Allergies  Allergen Reactions  . Cortisone Other (See Comments)    Causes emotional problems     Past Medical History:  Diagnosis Date  . Anginal pain ([place])   . Arthritis    "all over"  . Cancer ([place])   . CHF (congestive heart failure) ([place])   . Coronary artery disease   . Dementia ([place])   . Depression ~ 78; ~ 1990   PTSD/Mania   . GERD (gastroesophageal reflux disease)   . High cholesterol   . Hypertension   .  Hyperthyroidism   . Neuropathy   . Osteoporosis     Social History   Socioeconomic History  . Marital status: Divorced    Spouse name: Not on file  . Number of children: 3  . Years of education: unknown  . Highest education level: Not on file  Occupational History  . Not on file  Social Needs  . Financial resource strain: Not on file  . Food insecurity:    Worry: Not on file    Inability: Not on file  . Transportation needs:    Medical: Not on file    Non-medical: Not on file  Tobacco Use  . Smoking status: Former Smoker    Packs/day: 1.00    Years: 20.00    Pack years: 20.00    Types: Cigarettes    Last attempt to quit: 01/12/1962    Years since quitting: 56.1  . Smokeless tobacco: Never Used  Substance and Sexual Activity  . Alcohol use: No    Alcohol/week: 0.0 standard drinks    Comment: 05/08/2014 "he's been in [place] for ~!35 yrs"  . Drug use: No  . Sexual activity: Not on file  Lifestyle  . Physical activity:    Days per week: Not on file    Minutes per session: Not on file  . Stress: Not on file  Relationships  . Social connections:    Talks on phone: Not on file    Gets together: Not on file    Attends religious service: Not on file    Active member of club or organization: Not on file    Attends meetings of clubs or organizations: Not on file    Relationship status: Not on file  . Intimate partner violence:    Fear of current or ex partner: Not on file    Emotionally abused: Not on file    Physically abused: Not on file    Forced sexual activity: Not on file  Other Topics Concern  . Not on file  Social History Narrative   He was in [name] youth camps as a child. He emigrated to the [place] after World War II. He was staying with his host family in [place] when he got drafted for [place].      09/23/17 He now lives in [place], [place] and his daughter helps in his care.     Family History  Problem Relation Age of Onset  . CAD Mother    . Asthma Mother      Review of Systems: General: negative for chills, fever, night sweats or weight  changes.  Cardiovascular: negative for chest pain, dyspnea on exertion, edema, orthopnea, palpitations, paroxysmal nocturnal dyspnea or shortness of breath Dermatological: negative for rash Respiratory: negative for cough or wheezing Urologic: negative for hematuria Abdominal: negative for nausea, vomiting, diarrhea, bright red blood per rectum, melena, or hematemesis Neurologic: negative for visual changes, syncope, or dizziness All other systems reviewed and are otherwise negative except as noted above.    Blood pressure 119/67, pulse (!) 59, height 5' 11"  (1.803 m), weight 192 lb (87.1 kg).  General appearance: alert, cooperative and no distress, poor dentition Neck: no JVD Lungs: decreased breath sounds at bases Heart: regular rate and rhythm Extremities: no edema Skin: pale, dry Neurologic: Grossly normal  EKG NSR, HR 60, RBBB, septal Qs  Lab 03/10/2018- BUN 23.5 SCr 1.07  ASSESSMENT AND PLAN:   Chronic diastolic heart failure ([place]) I believe his goal weight is actually 190-195 lbs. He is being managed expertly at [facility].  CAD S/P percutaneous coronary angioplasty Known CTO of his LAD, s/p RCA PCI with DES 2016  Paroxysmal atrial fibrillation (H[facility]) Holding NSR. HR 60- off Diltiazem secondary to bradycardia.  Chronic anticoagulation CHADS VASC= 5. He is on Eliquis  Essential hypertension Stable  Bladder outflow obstruction He has had recurrent bladder outlet obstruction- he denies any trouble voiding today.   PLAN  Same Rx- extra Lasix for a few doses for weight > 195 lbs.   Kerin Ransom PA-C 03/11/2018 10:33 AM

## 2018-03-11 NOTE — Progress Notes (Signed)
Ty! MCr 

## 2018-03-12 DIAGNOSIS — D649 Anemia, unspecified: Secondary | ICD-10-CM | POA: Diagnosis not present

## 2018-03-12 DIAGNOSIS — I5033 Acute on chronic diastolic (congestive) heart failure: Secondary | ICD-10-CM | POA: Diagnosis not present

## 2018-03-15 ENCOUNTER — Non-Acute Institutional Stay: Payer: Medicare Other | Admitting: Licensed Clinical Social Worker

## 2018-03-15 DIAGNOSIS — M6281 Muscle weakness (generalized): Secondary | ICD-10-CM | POA: Diagnosis not present

## 2018-03-15 DIAGNOSIS — R278 Other lack of coordination: Secondary | ICD-10-CM | POA: Diagnosis not present

## 2018-03-15 DIAGNOSIS — Z515 Encounter for palliative care: Secondary | ICD-10-CM

## 2018-03-15 DIAGNOSIS — I5032 Chronic diastolic (congestive) heart failure: Secondary | ICD-10-CM | POA: Diagnosis not present

## 2018-03-16 DIAGNOSIS — M6281 Muscle weakness (generalized): Secondary | ICD-10-CM | POA: Diagnosis not present

## 2018-03-16 DIAGNOSIS — R278 Other lack of coordination: Secondary | ICD-10-CM | POA: Diagnosis not present

## 2018-03-16 DIAGNOSIS — I5032 Chronic diastolic (congestive) heart failure: Secondary | ICD-10-CM | POA: Diagnosis not present

## 2018-03-16 NOTE — Progress Notes (Signed)
COMMUNITY PALLIATIVE CARE SW NOTE  PATIENT NAME: [name] [name] DOB: 09/06/1925 MRN: [id number]  PRIMARY CARE PROVIDER: Jodi Marble, MD  RESPONSIBLE PARTY:  Acct ID - Guarantor Home Phone Work Phone Relationship Acct Type  [id number] QUANAH, MAJKA [phone]  Self P/F     [address], [place], [place] [address]     PLAN OF CARE and INTERVENTIONS:             1. GOALS OF CARE/ ADVANCE CARE PLANNING:  Patient's goal is to remain at the facility.  He remains a full code. 2. SOCIAL/EMOTIONAL/SPIRITUAL ASSESSMENT/ INTERVENTIONS:  SW met with patient in his room at [facility].  He was in his w/c on the phone.  Patient did not display any nonverbal indicators of pain.  He was able to express his needs and requested a visit at a later time.  CNA reported no changes. 3. PATIENT/CAREGIVER EDUCATION/ COPING:  Patient copes by expressing himself.  He remains active in the facility. 4. PERSONAL EMERGENCY PLAN:  Per facility protocol. 5. COMMUNITY RESOURCES COORDINATION/ HEALTH CARE NAVIGATION:  None. 6. FINANCIAL/LEGAL CONCERNS/INTERVENTIONS:  None.     SOCIAL HX:  Social History   Tobacco Use  . Smoking status: Former Smoker    Packs/day: 1.00    Years: 20.00    Pack years: 20.00    Types: Cigarettes    Last attempt to quit: 01/12/1962    Years since quitting: 56.2  . Smokeless tobacco: Never Used  Substance Use Topics  . Alcohol use: No    Alcohol/week: 0.0 standard drinks    Comment: 05/08/2014 "he's been in [place] for ~!35 yrs"    CODE STATUS:  Full Code ADVANCED DIRECTIVES: N MOST FORM COMPLETE:  N HOSPICE EDUCATION PROVIDED: Patient's daughter works for [employer]. PPS:  Patient's appetite is normal.  He is able to propel in his w/c. Duration of visit and documentation:  30 minutes.      Creola Corn Hannie Shoe, LCSW

## 2018-03-17 DIAGNOSIS — I5032 Chronic diastolic (congestive) heart failure: Secondary | ICD-10-CM | POA: Diagnosis not present

## 2018-03-17 DIAGNOSIS — M6281 Muscle weakness (generalized): Secondary | ICD-10-CM | POA: Diagnosis not present

## 2018-03-17 DIAGNOSIS — R278 Other lack of coordination: Secondary | ICD-10-CM | POA: Diagnosis not present

## 2018-03-18 DIAGNOSIS — M6281 Muscle weakness (generalized): Secondary | ICD-10-CM | POA: Diagnosis not present

## 2018-03-18 DIAGNOSIS — I5032 Chronic diastolic (congestive) heart failure: Secondary | ICD-10-CM | POA: Diagnosis not present

## 2018-03-18 DIAGNOSIS — R278 Other lack of coordination: Secondary | ICD-10-CM | POA: Diagnosis not present

## 2018-03-19 DIAGNOSIS — M6281 Muscle weakness (generalized): Secondary | ICD-10-CM | POA: Diagnosis not present

## 2018-03-19 DIAGNOSIS — R278 Other lack of coordination: Secondary | ICD-10-CM | POA: Diagnosis not present

## 2018-03-19 DIAGNOSIS — I5032 Chronic diastolic (congestive) heart failure: Secondary | ICD-10-CM | POA: Diagnosis not present

## 2018-03-20 DIAGNOSIS — M6281 Muscle weakness (generalized): Secondary | ICD-10-CM | POA: Diagnosis not present

## 2018-03-20 DIAGNOSIS — I5032 Chronic diastolic (congestive) heart failure: Secondary | ICD-10-CM | POA: Diagnosis not present

## 2018-03-20 DIAGNOSIS — R278 Other lack of coordination: Secondary | ICD-10-CM | POA: Diagnosis not present

## 2018-03-21 DIAGNOSIS — I5032 Chronic diastolic (congestive) heart failure: Secondary | ICD-10-CM | POA: Diagnosis not present

## 2018-03-21 DIAGNOSIS — R278 Other lack of coordination: Secondary | ICD-10-CM | POA: Diagnosis not present

## 2018-03-21 DIAGNOSIS — M6281 Muscle weakness (generalized): Secondary | ICD-10-CM | POA: Diagnosis not present

## 2018-03-22 DIAGNOSIS — M6281 Muscle weakness (generalized): Secondary | ICD-10-CM | POA: Diagnosis not present

## 2018-03-22 DIAGNOSIS — I5032 Chronic diastolic (congestive) heart failure: Secondary | ICD-10-CM | POA: Diagnosis not present

## 2018-03-22 DIAGNOSIS — R278 Other lack of coordination: Secondary | ICD-10-CM | POA: Diagnosis not present

## 2018-03-23 DIAGNOSIS — I5032 Chronic diastolic (congestive) heart failure: Secondary | ICD-10-CM | POA: Diagnosis not present

## 2018-03-23 DIAGNOSIS — R278 Other lack of coordination: Secondary | ICD-10-CM | POA: Diagnosis not present

## 2018-03-23 DIAGNOSIS — M6281 Muscle weakness (generalized): Secondary | ICD-10-CM | POA: Diagnosis not present

## 2018-03-24 DIAGNOSIS — I10 Essential (primary) hypertension: Secondary | ICD-10-CM | POA: Diagnosis not present

## 2018-03-24 DIAGNOSIS — I5032 Chronic diastolic (congestive) heart failure: Secondary | ICD-10-CM | POA: Diagnosis not present

## 2018-03-24 DIAGNOSIS — M6281 Muscle weakness (generalized): Secondary | ICD-10-CM | POA: Diagnosis not present

## 2018-03-24 DIAGNOSIS — R278 Other lack of coordination: Secondary | ICD-10-CM | POA: Diagnosis not present

## 2018-03-24 DIAGNOSIS — I480 Paroxysmal atrial fibrillation: Secondary | ICD-10-CM | POA: Diagnosis not present

## 2018-03-24 DIAGNOSIS — I2510 Atherosclerotic heart disease of native coronary artery without angina pectoris: Secondary | ICD-10-CM | POA: Diagnosis not present

## 2018-03-24 DIAGNOSIS — J449 Chronic obstructive pulmonary disease, unspecified: Secondary | ICD-10-CM | POA: Diagnosis not present

## 2018-03-25 DIAGNOSIS — M6281 Muscle weakness (generalized): Secondary | ICD-10-CM | POA: Diagnosis not present

## 2018-03-25 DIAGNOSIS — I5032 Chronic diastolic (congestive) heart failure: Secondary | ICD-10-CM | POA: Diagnosis not present

## 2018-03-25 DIAGNOSIS — R278 Other lack of coordination: Secondary | ICD-10-CM | POA: Diagnosis not present

## 2018-03-26 DIAGNOSIS — M6281 Muscle weakness (generalized): Secondary | ICD-10-CM | POA: Diagnosis not present

## 2018-03-26 DIAGNOSIS — R278 Other lack of coordination: Secondary | ICD-10-CM | POA: Diagnosis not present

## 2018-03-26 DIAGNOSIS — I5032 Chronic diastolic (congestive) heart failure: Secondary | ICD-10-CM | POA: Diagnosis not present

## 2018-03-28 DIAGNOSIS — I5032 Chronic diastolic (congestive) heart failure: Secondary | ICD-10-CM | POA: Diagnosis not present

## 2018-03-28 DIAGNOSIS — R278 Other lack of coordination: Secondary | ICD-10-CM | POA: Diagnosis not present

## 2018-03-28 DIAGNOSIS — M6281 Muscle weakness (generalized): Secondary | ICD-10-CM | POA: Diagnosis not present

## 2018-03-29 DIAGNOSIS — R278 Other lack of coordination: Secondary | ICD-10-CM | POA: Diagnosis not present

## 2018-03-29 DIAGNOSIS — I5032 Chronic diastolic (congestive) heart failure: Secondary | ICD-10-CM | POA: Diagnosis not present

## 2018-03-29 DIAGNOSIS — M6281 Muscle weakness (generalized): Secondary | ICD-10-CM | POA: Diagnosis not present

## 2018-03-30 DIAGNOSIS — R278 Other lack of coordination: Secondary | ICD-10-CM | POA: Diagnosis not present

## 2018-03-30 DIAGNOSIS — M6281 Muscle weakness (generalized): Secondary | ICD-10-CM | POA: Diagnosis not present

## 2018-03-30 DIAGNOSIS — I5032 Chronic diastolic (congestive) heart failure: Secondary | ICD-10-CM | POA: Diagnosis not present

## 2018-03-31 DIAGNOSIS — M6281 Muscle weakness (generalized): Secondary | ICD-10-CM | POA: Diagnosis not present

## 2018-03-31 DIAGNOSIS — I5032 Chronic diastolic (congestive) heart failure: Secondary | ICD-10-CM | POA: Diagnosis not present

## 2018-03-31 DIAGNOSIS — R278 Other lack of coordination: Secondary | ICD-10-CM | POA: Diagnosis not present

## 2018-04-01 DIAGNOSIS — R278 Other lack of coordination: Secondary | ICD-10-CM | POA: Diagnosis not present

## 2018-04-01 DIAGNOSIS — I480 Paroxysmal atrial fibrillation: Secondary | ICD-10-CM | POA: Diagnosis not present

## 2018-04-01 DIAGNOSIS — I2510 Atherosclerotic heart disease of native coronary artery without angina pectoris: Secondary | ICD-10-CM | POA: Diagnosis not present

## 2018-04-01 DIAGNOSIS — I10 Essential (primary) hypertension: Secondary | ICD-10-CM | POA: Diagnosis not present

## 2018-04-01 DIAGNOSIS — J449 Chronic obstructive pulmonary disease, unspecified: Secondary | ICD-10-CM | POA: Diagnosis not present

## 2018-04-01 DIAGNOSIS — I5032 Chronic diastolic (congestive) heart failure: Secondary | ICD-10-CM | POA: Diagnosis not present

## 2018-04-01 DIAGNOSIS — M6281 Muscle weakness (generalized): Secondary | ICD-10-CM | POA: Diagnosis not present

## 2018-04-04 DIAGNOSIS — R278 Other lack of coordination: Secondary | ICD-10-CM | POA: Diagnosis not present

## 2018-04-04 DIAGNOSIS — M6281 Muscle weakness (generalized): Secondary | ICD-10-CM | POA: Diagnosis not present

## 2018-04-04 DIAGNOSIS — I5032 Chronic diastolic (congestive) heart failure: Secondary | ICD-10-CM | POA: Diagnosis not present

## 2018-04-05 DIAGNOSIS — R278 Other lack of coordination: Secondary | ICD-10-CM | POA: Diagnosis not present

## 2018-04-05 DIAGNOSIS — M6281 Muscle weakness (generalized): Secondary | ICD-10-CM | POA: Diagnosis not present

## 2018-04-05 DIAGNOSIS — I5032 Chronic diastolic (congestive) heart failure: Secondary | ICD-10-CM | POA: Diagnosis not present

## 2018-04-06 DIAGNOSIS — R278 Other lack of coordination: Secondary | ICD-10-CM | POA: Diagnosis not present

## 2018-04-06 DIAGNOSIS — M6281 Muscle weakness (generalized): Secondary | ICD-10-CM | POA: Diagnosis not present

## 2018-04-06 DIAGNOSIS — I5032 Chronic diastolic (congestive) heart failure: Secondary | ICD-10-CM | POA: Diagnosis not present

## 2018-04-07 DIAGNOSIS — R278 Other lack of coordination: Secondary | ICD-10-CM | POA: Diagnosis not present

## 2018-04-07 DIAGNOSIS — M6281 Muscle weakness (generalized): Secondary | ICD-10-CM | POA: Diagnosis not present

## 2018-04-07 DIAGNOSIS — I5032 Chronic diastolic (congestive) heart failure: Secondary | ICD-10-CM | POA: Diagnosis not present

## 2018-04-09 DIAGNOSIS — R278 Other lack of coordination: Secondary | ICD-10-CM | POA: Diagnosis not present

## 2018-04-09 DIAGNOSIS — I5032 Chronic diastolic (congestive) heart failure: Secondary | ICD-10-CM | POA: Diagnosis not present

## 2018-04-09 DIAGNOSIS — M6281 Muscle weakness (generalized): Secondary | ICD-10-CM | POA: Diagnosis not present

## 2018-04-10 DIAGNOSIS — I5032 Chronic diastolic (congestive) heart failure: Secondary | ICD-10-CM | POA: Diagnosis not present

## 2018-04-10 DIAGNOSIS — R278 Other lack of coordination: Secondary | ICD-10-CM | POA: Diagnosis not present

## 2018-04-10 DIAGNOSIS — M6281 Muscle weakness (generalized): Secondary | ICD-10-CM | POA: Diagnosis not present

## 2018-04-11 DIAGNOSIS — R278 Other lack of coordination: Secondary | ICD-10-CM | POA: Diagnosis not present

## 2018-04-11 DIAGNOSIS — M6281 Muscle weakness (generalized): Secondary | ICD-10-CM | POA: Diagnosis not present

## 2018-04-11 DIAGNOSIS — I5032 Chronic diastolic (congestive) heart failure: Secondary | ICD-10-CM | POA: Diagnosis not present

## 2018-04-12 DIAGNOSIS — I5032 Chronic diastolic (congestive) heart failure: Secondary | ICD-10-CM | POA: Diagnosis not present

## 2018-04-12 DIAGNOSIS — M6281 Muscle weakness (generalized): Secondary | ICD-10-CM | POA: Diagnosis not present

## 2018-04-12 DIAGNOSIS — R278 Other lack of coordination: Secondary | ICD-10-CM | POA: Diagnosis not present

## 2018-04-13 DIAGNOSIS — R278 Other lack of coordination: Secondary | ICD-10-CM | POA: Diagnosis not present

## 2018-04-13 DIAGNOSIS — I5032 Chronic diastolic (congestive) heart failure: Secondary | ICD-10-CM | POA: Diagnosis not present

## 2018-04-13 DIAGNOSIS — M6281 Muscle weakness (generalized): Secondary | ICD-10-CM | POA: Diagnosis not present

## 2018-04-14 DIAGNOSIS — I5032 Chronic diastolic (congestive) heart failure: Secondary | ICD-10-CM | POA: Diagnosis not present

## 2018-04-14 DIAGNOSIS — M6281 Muscle weakness (generalized): Secondary | ICD-10-CM | POA: Diagnosis not present

## 2018-04-14 DIAGNOSIS — R278 Other lack of coordination: Secondary | ICD-10-CM | POA: Diagnosis not present

## 2018-04-15 DIAGNOSIS — M6281 Muscle weakness (generalized): Secondary | ICD-10-CM | POA: Diagnosis not present

## 2018-04-15 DIAGNOSIS — R278 Other lack of coordination: Secondary | ICD-10-CM | POA: Diagnosis not present

## 2018-04-15 DIAGNOSIS — I5032 Chronic diastolic (congestive) heart failure: Secondary | ICD-10-CM | POA: Diagnosis not present

## 2018-04-16 DIAGNOSIS — M6281 Muscle weakness (generalized): Secondary | ICD-10-CM | POA: Diagnosis not present

## 2018-04-16 DIAGNOSIS — I5032 Chronic diastolic (congestive) heart failure: Secondary | ICD-10-CM | POA: Diagnosis not present

## 2018-04-16 DIAGNOSIS — R278 Other lack of coordination: Secondary | ICD-10-CM | POA: Diagnosis not present

## 2018-04-17 DIAGNOSIS — M6281 Muscle weakness (generalized): Secondary | ICD-10-CM | POA: Diagnosis not present

## 2018-04-17 DIAGNOSIS — I5032 Chronic diastolic (congestive) heart failure: Secondary | ICD-10-CM | POA: Diagnosis not present

## 2018-04-17 DIAGNOSIS — R278 Other lack of coordination: Secondary | ICD-10-CM | POA: Diagnosis not present

## 2018-04-18 DIAGNOSIS — R278 Other lack of coordination: Secondary | ICD-10-CM | POA: Diagnosis not present

## 2018-04-18 DIAGNOSIS — I5032 Chronic diastolic (congestive) heart failure: Secondary | ICD-10-CM | POA: Diagnosis not present

## 2018-04-18 DIAGNOSIS — M6281 Muscle weakness (generalized): Secondary | ICD-10-CM | POA: Diagnosis not present

## 2018-04-19 DIAGNOSIS — R278 Other lack of coordination: Secondary | ICD-10-CM | POA: Diagnosis not present

## 2018-04-19 DIAGNOSIS — M6281 Muscle weakness (generalized): Secondary | ICD-10-CM | POA: Diagnosis not present

## 2018-04-19 DIAGNOSIS — I5032 Chronic diastolic (congestive) heart failure: Secondary | ICD-10-CM | POA: Diagnosis not present

## 2018-04-20 DIAGNOSIS — I5032 Chronic diastolic (congestive) heart failure: Secondary | ICD-10-CM | POA: Diagnosis not present

## 2018-04-20 DIAGNOSIS — M6281 Muscle weakness (generalized): Secondary | ICD-10-CM | POA: Diagnosis not present

## 2018-04-20 DIAGNOSIS — R278 Other lack of coordination: Secondary | ICD-10-CM | POA: Diagnosis not present

## 2018-04-21 DIAGNOSIS — I2510 Atherosclerotic heart disease of native coronary artery without angina pectoris: Secondary | ICD-10-CM | POA: Diagnosis not present

## 2018-04-21 DIAGNOSIS — R278 Other lack of coordination: Secondary | ICD-10-CM | POA: Diagnosis not present

## 2018-04-21 DIAGNOSIS — J449 Chronic obstructive pulmonary disease, unspecified: Secondary | ICD-10-CM | POA: Diagnosis not present

## 2018-04-21 DIAGNOSIS — I480 Paroxysmal atrial fibrillation: Secondary | ICD-10-CM | POA: Diagnosis not present

## 2018-04-21 DIAGNOSIS — M6281 Muscle weakness (generalized): Secondary | ICD-10-CM | POA: Diagnosis not present

## 2018-04-21 DIAGNOSIS — I10 Essential (primary) hypertension: Secondary | ICD-10-CM | POA: Diagnosis not present

## 2018-04-21 DIAGNOSIS — I5032 Chronic diastolic (congestive) heart failure: Secondary | ICD-10-CM | POA: Diagnosis not present

## 2018-04-22 DIAGNOSIS — I5032 Chronic diastolic (congestive) heart failure: Secondary | ICD-10-CM | POA: Diagnosis not present

## 2018-04-22 DIAGNOSIS — M6281 Muscle weakness (generalized): Secondary | ICD-10-CM | POA: Diagnosis not present

## 2018-04-22 DIAGNOSIS — R278 Other lack of coordination: Secondary | ICD-10-CM | POA: Diagnosis not present

## 2018-04-24 DIAGNOSIS — I5032 Chronic diastolic (congestive) heart failure: Secondary | ICD-10-CM | POA: Diagnosis not present

## 2018-04-24 DIAGNOSIS — M6281 Muscle weakness (generalized): Secondary | ICD-10-CM | POA: Diagnosis not present

## 2018-04-24 DIAGNOSIS — R278 Other lack of coordination: Secondary | ICD-10-CM | POA: Diagnosis not present

## 2018-04-25 DIAGNOSIS — I5032 Chronic diastolic (congestive) heart failure: Secondary | ICD-10-CM | POA: Diagnosis not present

## 2018-04-25 DIAGNOSIS — M6281 Muscle weakness (generalized): Secondary | ICD-10-CM | POA: Diagnosis not present

## 2018-04-25 DIAGNOSIS — R278 Other lack of coordination: Secondary | ICD-10-CM | POA: Diagnosis not present

## 2018-04-26 DIAGNOSIS — R278 Other lack of coordination: Secondary | ICD-10-CM | POA: Diagnosis not present

## 2018-04-26 DIAGNOSIS — M6281 Muscle weakness (generalized): Secondary | ICD-10-CM | POA: Diagnosis not present

## 2018-04-26 DIAGNOSIS — I5032 Chronic diastolic (congestive) heart failure: Secondary | ICD-10-CM | POA: Diagnosis not present

## 2018-04-27 DIAGNOSIS — R278 Other lack of coordination: Secondary | ICD-10-CM | POA: Diagnosis not present

## 2018-04-27 DIAGNOSIS — M6281 Muscle weakness (generalized): Secondary | ICD-10-CM | POA: Diagnosis not present

## 2018-04-27 DIAGNOSIS — I5032 Chronic diastolic (congestive) heart failure: Secondary | ICD-10-CM | POA: Diagnosis not present

## 2018-04-28 DIAGNOSIS — R278 Other lack of coordination: Secondary | ICD-10-CM | POA: Diagnosis not present

## 2018-04-28 DIAGNOSIS — M6281 Muscle weakness (generalized): Secondary | ICD-10-CM | POA: Diagnosis not present

## 2018-04-28 DIAGNOSIS — I5032 Chronic diastolic (congestive) heart failure: Secondary | ICD-10-CM | POA: Diagnosis not present

## 2018-04-29 DIAGNOSIS — I2510 Atherosclerotic heart disease of native coronary artery without angina pectoris: Secondary | ICD-10-CM | POA: Diagnosis not present

## 2018-04-29 DIAGNOSIS — J449 Chronic obstructive pulmonary disease, unspecified: Secondary | ICD-10-CM | POA: Diagnosis not present

## 2018-04-29 DIAGNOSIS — I480 Paroxysmal atrial fibrillation: Secondary | ICD-10-CM | POA: Diagnosis not present

## 2018-04-29 DIAGNOSIS — R278 Other lack of coordination: Secondary | ICD-10-CM | POA: Diagnosis not present

## 2018-04-29 DIAGNOSIS — M6281 Muscle weakness (generalized): Secondary | ICD-10-CM | POA: Diagnosis not present

## 2018-04-29 DIAGNOSIS — I5032 Chronic diastolic (congestive) heart failure: Secondary | ICD-10-CM | POA: Diagnosis not present

## 2018-04-29 DIAGNOSIS — I10 Essential (primary) hypertension: Secondary | ICD-10-CM | POA: Diagnosis not present

## 2018-05-01 DIAGNOSIS — I5032 Chronic diastolic (congestive) heart failure: Secondary | ICD-10-CM | POA: Diagnosis not present

## 2018-05-01 DIAGNOSIS — M6281 Muscle weakness (generalized): Secondary | ICD-10-CM | POA: Diagnosis not present

## 2018-05-01 DIAGNOSIS — R278 Other lack of coordination: Secondary | ICD-10-CM | POA: Diagnosis not present

## 2018-05-02 DIAGNOSIS — M6281 Muscle weakness (generalized): Secondary | ICD-10-CM | POA: Diagnosis not present

## 2018-05-02 DIAGNOSIS — I5032 Chronic diastolic (congestive) heart failure: Secondary | ICD-10-CM | POA: Diagnosis not present

## 2018-05-02 DIAGNOSIS — R278 Other lack of coordination: Secondary | ICD-10-CM | POA: Diagnosis not present

## 2018-05-03 DIAGNOSIS — M6281 Muscle weakness (generalized): Secondary | ICD-10-CM | POA: Diagnosis not present

## 2018-05-03 DIAGNOSIS — I480 Paroxysmal atrial fibrillation: Secondary | ICD-10-CM | POA: Diagnosis not present

## 2018-05-03 DIAGNOSIS — I2510 Atherosclerotic heart disease of native coronary artery without angina pectoris: Secondary | ICD-10-CM | POA: Diagnosis not present

## 2018-05-03 DIAGNOSIS — J449 Chronic obstructive pulmonary disease, unspecified: Secondary | ICD-10-CM | POA: Diagnosis not present

## 2018-05-03 DIAGNOSIS — R278 Other lack of coordination: Secondary | ICD-10-CM | POA: Diagnosis not present

## 2018-05-03 DIAGNOSIS — I5030 Unspecified diastolic (congestive) heart failure: Secondary | ICD-10-CM | POA: Diagnosis not present

## 2018-05-03 DIAGNOSIS — I5032 Chronic diastolic (congestive) heart failure: Secondary | ICD-10-CM | POA: Diagnosis not present

## 2018-05-04 DIAGNOSIS — I5032 Chronic diastolic (congestive) heart failure: Secondary | ICD-10-CM | POA: Diagnosis not present

## 2018-05-04 DIAGNOSIS — R278 Other lack of coordination: Secondary | ICD-10-CM | POA: Diagnosis not present

## 2018-05-04 DIAGNOSIS — M6281 Muscle weakness (generalized): Secondary | ICD-10-CM | POA: Diagnosis not present

## 2018-05-05 DIAGNOSIS — R278 Other lack of coordination: Secondary | ICD-10-CM | POA: Diagnosis not present

## 2018-05-05 DIAGNOSIS — M6281 Muscle weakness (generalized): Secondary | ICD-10-CM | POA: Diagnosis not present

## 2018-05-05 DIAGNOSIS — I5032 Chronic diastolic (congestive) heart failure: Secondary | ICD-10-CM | POA: Diagnosis not present

## 2018-05-06 ENCOUNTER — Telehealth: Payer: Self-pay | Admitting: Physician Assistant

## 2018-05-06 DIAGNOSIS — R278 Other lack of coordination: Secondary | ICD-10-CM | POA: Diagnosis not present

## 2018-05-06 DIAGNOSIS — M6281 Muscle weakness (generalized): Secondary | ICD-10-CM | POA: Diagnosis not present

## 2018-05-06 DIAGNOSIS — I5032 Chronic diastolic (congestive) heart failure: Secondary | ICD-10-CM | POA: Diagnosis not present

## 2018-05-06 NOTE — Telephone Encounter (Signed)
[name], patient's daughter returned your call.

## 2018-05-06 NOTE — Telephone Encounter (Signed)
Spoke with pt daughter, [name], concerning pt appointment on 5/1 per [name], PA. She stated the nursing home is is in [place] lockdown and she is not sure if they want him going out and coming back in and may want to postpone visit. She stated that her sister, [name], is the one that usually takes him to his visits and she will contact her. I let [name] know that we are sheduling in-office visits in August as another option if they would rather do that.   Per [name], PA--wanted to ask if pt could come in for an in-office visit with Dr. [name] on 4/30 because it would be very difficult to evaluate pt virtually.  [name] stated she will call the office back to let us know how they would like to proceed. She stated the facility has been taking very good care of him.  Routing to [place] as FYI.

## 2018-05-06 NOTE — Telephone Encounter (Signed)
Thx

## 2018-05-09 NOTE — Telephone Encounter (Signed)
Returned pt daughter, [name], call.   She stated she does not feel comfortable bringing him out of the nursing home. She stated she spoke with Caryl Asp, RN at [facility] who is pt main nurse taking care of him and the Mudlogger of nursing there, Levada Dy. They said they would be happy to send any information we would need--blood work, updated meds, vitals--for review. She stated they are not doing EKGs right now because the company that was coming into the facility had been in other facilities with COVID patients and needed to update protocols to keep patients safe. So far, no COVID patients at [facility]. [name] stated the nurse said pt is over 195 lbs and they do not have an order for PRN extra diuretic that was previously discussed about the possibility of doing with Kerin Ransom, PA. Marland Mcalpine also stated she thinks the facility has faxed over an order form to be filled out for this. She satted she will be talking to them again tomorrow.  Rolanda Lundborg I would send this information to [place], [place] to advise and call her back with recommendations. [name] verbalized thanks and understanding.

## 2018-05-10 NOTE — Telephone Encounter (Signed)
I understand their concerns.   If we could get a BMET, weight sheets faxed over, Med list, last 4 VS from them. Perhaps his daughter could use an IPAD or smartphone for a video visit.  Since they have no COVID patients there, keep him there.   Ok to take an extra Lasix tab 3 x week for weight gain or increased LE edema. Take extra K+ with extra Lasix.

## 2018-05-12 ENCOUNTER — Non-Acute Institutional Stay: Payer: Medicare Other | Admitting: Licensed Clinical Social Worker

## 2018-05-12 ENCOUNTER — Other Ambulatory Visit: Payer: Self-pay

## 2018-05-12 DIAGNOSIS — Z515 Encounter for palliative care: Secondary | ICD-10-CM

## 2018-05-13 ENCOUNTER — Telehealth (INDEPENDENT_AMBULATORY_CARE_PROVIDER_SITE_OTHER): Payer: Medicare Other | Admitting: Physician Assistant

## 2018-05-13 ENCOUNTER — Telehealth: Payer: Self-pay | Admitting: Physician Assistant

## 2018-05-13 ENCOUNTER — Other Ambulatory Visit: Payer: Self-pay

## 2018-05-13 VITALS — Wt 198.0 lb

## 2018-05-13 DIAGNOSIS — I5032 Chronic diastolic (congestive) heart failure: Secondary | ICD-10-CM | POA: Diagnosis not present

## 2018-05-13 DIAGNOSIS — E876 Hypokalemia: Secondary | ICD-10-CM

## 2018-05-13 NOTE — Telephone Encounter (Addendum)
Spoke to pt daughter, [name], to give Rhonda's recommendations. Pt daughter verbalized understanding. She stated we can try to call [facility] to see if they could do the video visit there. Also to ask them for information and see if we need written order.  Spoke to [employer] Caryl Pina, Therapist, sports) who agreed to be able to do video visit for pt. She gave her personal smartphone number [phone]. She stated she may be rounding with an NP at that time, but asked that we send the link so they can get it set up. She stated she does not think rounding will take more than an hour, so 10AM should be fine. If not, she will have Production assistant, radio do the video (Joy,RN).  Gave orders to [place], Therapist, sports from [facility]. She wrote down orders to get a BMET and have information available and faxed. Also wrote down orders for ok to take extra Lasix with extra potassium.  Routing to [place] as FYI.

## 2018-05-13 NOTE — Telephone Encounter (Signed)
F/U Message           Juliann Pulse @ [facility] is calling to give another # [phone] for the telephone visit. Pls call to advise.

## 2018-05-13 NOTE — Progress Notes (Signed)
Virtual Visit via Video Note   This visit type was conducted due to national recommendations for restrictions regarding the COVID-19 Pandemic (e.g. social distancing) in an effort to limit this patient's exposure and mitigate transmission in our community.  Due to his co-morbid illnesses, this patient is at least at moderate risk for complications without adequate follow up.  This format is felt to be most appropriate for this patient at this time.  All issues noted in this document were discussed and addressed.  A limited physical exam was performed with this format.  Please refer to the patient's chart for his consent to telehealth for [facility].  Evaluation Performed:  Follow-up visit  This visit type was conducted due to national recommendations for restrictions regarding the COVID-19 Pandemic (e.g. social distancing).  This format is felt to be most appropriate for this patient at this time.  All issues noted in this document were discussed and addressed.  No physical exam was performed (except for noted visual exam findings with Video Visits).  Please refer to the patient's chart (MyChart message for video visits and phone note for telephone visits) for the patient's consent to telehealth for [facility].  Date:  05/13/2018   ID:  [name] [name], DOB Apr 19, 1925, MRN [id number]  Patient Location:  [facility]  Provider location:   Off-Site  PCP:  Jodi Marble, MD  Cardiologist:  [name], MD 11/25/2017 Kerin Ransom, PA-C, 03/11/2018 Electrophysiologist:  None   Chief Complaint:  CHF   History of Present Illness:    [name] [name] is a 85 y.o. male who presents via audio/video conferencing for a telehealth visit today.    85 y.o. yo male who has a hx of chronic LAD occlusion w/ DES RCA 2016 by Dr Claiborne Billings, GERD, HTN, HLD, hyperthyroid, peripheral neuropathy (feet). EF 50%. Imdur used periodically for angina. HOH (both he and hisolderdaughter). RBBB, on Eliquis  with CHA2DS2=VASc = 5 (age x 2, CHF, CAD, HTN)  At 2/28 office visit, his weight was 192 pounds, goal weight felt 190-195 pounds.  He was in sinus rhythm, diltiazem had been stopped secondary to bradycardia, tolerating Eliquis.  Plan was to take extra Lasix as needed for weights over 195 pounds.  The visit today was conducted with the assistance of Joy, the nurse supervisor.  He is very hard of hearing, but she assisted by providing information and repeating his questions and answers.  Mr. Coker feels like he is at his baseline.  He did his exercise today and did well with this.  He is resting a little bit after that but got up and sat in the wheelchair in a little while.  According to Mr. Burtch, he is not any more short of breath than usual when he exerts himself.  He is not having resting shortness of breath.  He denies lower extremity edema, which the nurse confirmed.  He is not having chest pain.  His weight is trending up, his weight on 4/17 was 195, on 4/24 his weight was 197 and today it is 198.  He has not gotten any extra Lasix, the order for as needed Lasix did not come through.  He has not had any recent lab work.  The nurse states that he will get snacks at times, she caught him eating popcorn the other day.  He also drinks from the water fountain up on occasion and was noted to have a bottle of water in his room that  was not given to him by the staff.  The patient does not have symptoms concerning for COVID-19 infection (fever, chills, cough, or new shortness of breath).    Prior CV studies:   The following studies were reviewed today:  ECHO: 04/19/2017 - Left ventricle: The cavity size was normal. Wall thickness was increased in a pattern of mild LVH. There was mild focal basal hypertrophy of the septum. Systolic function was normal. The estimated ejection fraction was in the range of 50% to 55%. Wall motion was normal; there were no regional wall motion  abnormalities. - Aortic valve: There was trivial regurgitation. - Ascending aorta: The ascending aorta was mildly dilated. - Mitral valve: There was mild regurgitation. - Left atrium: The atrium was severely dilated. - Pulmonary arteries: PA peak pressure: 33 mm Hg (S).  CARDIAC CATH: 03/05/2009 L main ok LAD - prox occlusion w/ grade 2 collaterals CFX - 20% RCA - 50%  Past Medical History:  Diagnosis Date  . Anginal pain ([place])   . Arthritis    "all over"  . Cancer ([place])   . CHF (congestive heart failure) ([place])   . Coronary artery disease   . Dementia ([place])   . Depression ~ 49; ~ 1990   PTSD/[place]   . GERD (gastroesophageal reflux disease)   . High cholesterol   . Hypertension   . Hyperthyroidism   . Neuropathy   . Osteoporosis    Past Surgical History:  Procedure Laterality Date  . BACK SURGERY    . CARDIAC CATHETERIZATION  09/2007  . CARDIAC CATHETERIZATION N/A 05/08/2014   Procedure: CORONARY STENT INTERVENTION;  Surgeon: [name], MD; 3.518 mm Xience Alpine DES to the RCA   . LEFT HEART CATHETERIZATION WITH CORONARY ANGIOGRAM N/A 05/08/2014   Procedure: LEFT HEART CATHETERIZATION WITH CORONARY ANGIOGRAM;  Surgeon: [name], MD; LAD 100% (old), CFX 20%, OM1 30%, pRCA 30%, dRCA 95%, EF 55%     . LUMBAR LAMINECTOMY/DECOMPRESSION MICRODISCECTOMY  2011     Current Meds  Medication Sig  . acetaminophen (TYLENOL) 500 MG tablet Take 500-1,000 mg by mouth every 6 (six) hours as needed for mild pain.   [name] acyclovir (ZOVIRAX) 800 MG tablet Take 800 mg by mouth 4 (four) times daily.  [name] apixaban (ELIQUIS) 5 MG TABS tablet Take 1 tablet (5 mg total) by mouth 2 (two) times daily.  . Carboxymethylcellulose Sod PF 1 % GEL Place 1 drop into both eyes 2 (two) times daily.   . Cyanocobalamin (B-12 COMPLIANCE INJECTION) 1000 MCG/ML KIT Inject 1,000 mcg as directed See admin instructions. On the 20th of every month  . dextromethorphan-guaiFENesin (MUCINEX DM) 30-600 MG  12hr tablet Take 2 tablets by mouth 2 (two) times daily.  Blanca Friend [facility]) OINT Apply 1 application topically 3 (three) times daily. To penis  . finasteride (PROSCAR) 5 MG tablet Take 5 mg by mouth daily.  . isosorbide mononitrate (IMDUR) 30 MG 24 hr tablet Take 30 mg by mouth daily.  [name] levalbuterol (XOPENEX) 1.25 MG/0.5ML nebulizer solution Take 1.25 mg by nebulization 3 (three) times daily. (Patient taking differently: Take 1.25 mg by nebulization every 4 (four) hours. )  . lisinopril (PRINIVIL,ZESTRIL) 20 MG tablet Take 1 tablet (20 mg total) by mouth daily.  [name] loratadine (CLARITIN) 10 MG tablet Take 1 tablet (10 mg total) by mouth daily.  . meclizine (ANTIVERT) 12.5 MG tablet Take 12.5 mg by mouth every 4 (four) hours as needed for dizziness.  . metoprolol (LOPRESSOR) 100  MG tablet Take 100 mg by mouth 2 (two) times daily.  . Multiple Vitamins-Minerals (PRESERVISION AREDS 2+MULTI VIT) CAPS Take 1 capsule by mouth 2 (two) times daily.   [name] nystatin (NYSTATIN) powder Apply topically 3 (three) times daily. Affected areas on groin and buttocks  . pantoprazole (PROTONIX) 40 MG tablet Take 40 mg by mouth daily.  . polyethylene glycol (MIRALAX / GLYCOLAX) packet Take 17 g by mouth 2 (two) times daily.  . tamsulosin (FLOMAX) 0.4 MG CAPS capsule Take 1 capsule (0.4 mg total) by mouth daily after supper.  . traZODone (DESYREL) 50 MG tablet Take 50 mg by mouth at bedtime.   . Vitamin D, Cholecalciferol, 1000 units TABS Take 1,000 Units by mouth at bedtime.     Allergies:   Cortisone   Social History   Tobacco Use  . Smoking status: Former Smoker    Packs/day: 1.00    Years: 20.00    Pack years: 20.00    Types: Cigarettes    Last attempt to quit: 01/12/1962    Years since quitting: 56.3  . Smokeless tobacco: Never Used  Substance Use Topics  . Alcohol use: No    Alcohol/week: 0.0 standard drinks    Comment: 05/08/2014 "he's been in [place] for ~!35 yrs"  . Drug use: No     Family Hx: The  patient's family history includes Asthma in his mother; CAD in his mother.  ROS:   Please see the history of present illness.    All other systems reviewed and are negative.   Labs/Other Tests and Data Reviewed:    Recent Labs: 07/29/2017: ALT 31 09/02/2017: B Natriuretic Peptide 553.4; TSH 0.626 09/04/2017: Hemoglobin 13.8; Magnesium 2.0; Platelets 239 09/05/2017: BUN 33; Creatinine, Ser 1.26; Potassium 3.7; Sodium 143   Recent Lipid Panel Lab Results  Component Value Date/Time   CHOL 111 05/10/2014 10:15 AM   TRIG 60 05/10/2014 10:15 AM   HDL 30 (L) 05/10/2014 10:15 AM   CHOLHDL 3.7 05/10/2014 10:15 AM   LDLCALC 69 05/10/2014 10:15 AM    Wt Readings from Last 3 Encounters:  05/13/18 198 lb (89.8 kg)  03/11/18 192 lb (87.1 kg)  11/25/17 194 lb (88 kg)     Objective:    Vital Signs:  Wt 198 lb (89.8 kg)   BMI 27.62 kg/m    Well nourished, well developed male in no acute distress.  He was in no respiratory distress.  He had no lower extremity edema. With the nurse's assistance, I looked for JVD and did not see any.  ASSESSMENT & PLAN:    1.  Chronic diastolic CHF: - his weight has increased by about 5 lbs.  - Unclear if this is fluid weight, he may have just gained some - he is compliant with meds, but likes to drink more water than he should and has occasional dietary indiscretions as well. - high Lasix dose, but he probably does better w/ once daily dosing. - discuss w/ Dr [name] changing to torsemide at 80 mg qd and give a second dose of 40 mg prn for wt gain 3 lbs in a day or 5 lbs in a week. - for now, continue current Lasix dose, give  2nd dose 80 mg at 4 pm prn w/ same parameters. - check a BMET today or tomorrow. - follow up w/ Dr [name] in 2 months, in-person visit recommended if safe for patient.  COVID-19 Education: The signs and symptoms of COVID-19 were discussed with the patient and  how to seek care for testing (follow up with PCP or arrange E-visit).   The importance of social distancing was discussed today.  Patient Risk:   After full review of this patient's clinical status, I feel that they are at least moderate risk at this time.  Time:   Today, I have spent 18 minutes with the patient with telehealth technology discussing cardiac issues.     Medication Adjustments/Labs and Tests Ordered: Current medicines are reviewed at length with the patient today.  Concerns regarding medicines are outlined above.  Tests Ordered: No orders of the defined types were placed in this encounter.  Medication Changes: No orders of the defined types were placed in this encounter.   Disposition:  Follow up 2 months with Dr [name]  Signed, [name], PA-C  05/13/2018 11:42 AM    [place]

## 2018-05-13 NOTE — Telephone Encounter (Signed)
Telehealth visit completed by [name], PA with number listed below.

## 2018-05-13 NOTE — Progress Notes (Signed)
COMMUNITY PALLIATIVE CARE SW NOTE  PATIENT NAME: [name] [name] DOB: 1925-04-25 MRN: [id number]  PRIMARY CARE PROVIDER: Jodi Marble, MD  RESPONSIBLE PARTY:  Acct ID - Guarantor Home Phone Work Phone Relationship Acct Type  [id number] [name] [name] [phone]  Self P/F     [place], [place],  [address]   Due to the COVID-19 crisis, this virtual check-in visit was done via telephone from my office and it was initiated and consent given by this patient and or family.  PLAN OF CARE and INTERVENTIONS:             1. GOALS OF CARE/ ADVANCE CARE PLANNING:  Goal is for patient to remain at [facility].  Patient is a full code. 2. SOCIAL/EMOTIONAL/SPIRITUAL ASSESSMENT/ INTERVENTIONS:  SW conducted a Sales executive visit with patient's daughter, [name] [name].  She reported that patient is bored and lonely.  Patient can go outside.  [name] [name] spoke with the Activity Director in hopes that patient could assist with the gardening at the facility.  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  She also stated the facility is monitoring patient's fluid gain.  They would like his kidney function assessed.  [name] [name] is assessing the availability of an EKG. 3. PATIENT/CAREGIVER EDUCATION/ COPING:  SW provided education regarding the virtual check-in visit. 4. PERSONAL EMERGENCY PLAN:  Per facility protocol. 5. COMMUNITY RESOURCES COORDINATION/ HEALTH CARE NAVIGATION:  None. 6. FINANCIAL/LEGAL CONCERNS/INTERVENTIONS:  None.     SOCIAL HX:  Social History   Tobacco Use  . Smoking status: Former Smoker    Packs/day: 1.00    Years: 20.00    Pack years: 20.00    Types: Cigarettes    Last attempt to quit: 01/12/1962    Years since quitting: 56.3  . Smokeless tobacco: Never Used  Substance Use Topics  . Alcohol use: No    Alcohol/week: 0.0 standard drinks    Comment: 05/08/2014 "he's  been in [place] for ~!35 yrs"    CODE STATUS:  Full Code ADVANCED DIRECTIVES: N MOST FORM COMPLETE:  N HOSPICE EDUCATION PROVIDED:  Patient's daughter, [name] [name], works for [employer]. PPS:  Patient's appetite is normal.  He propels himself in his w/c. Duration of visit and documentation:  40 minutes.      [name] Corn Aislynn Cifelli, LCSW

## 2018-05-13 NOTE — Patient Instructions (Addendum)
Medication Instructions:  For now, continue current Lasix dose, give  2nd dose 80 mg at 4 pm prn w/ same parameters.  Lab work: - check a Art gallery manager today or tomorrow.  Follow-Up: At [facility], you and your health needs are our priority.  As part of our continuing mission to provide you with exceptional heart care, we have created designated Provider Care Teams.  These Care Teams include your primary Cardiologist (physician) and Advanced Practice Providers (APPs -  Physician Assistants and Nurse Practitioners) who all work together to provide you with the care you need, when you need it. . You have been scheduled for an in-office visit with Dr. [name] on 07/27/18 at 3:00 PM. (OFFICE VISIT RECOMMENDED IF SAFE)

## 2018-05-13 NOTE — Progress Notes (Signed)
Not sure how he ended up on furosemide 160 mg once daily. I agree it is possible that he has gained some true weight. Torsemide is a good idea. I think we're asking for a lot for them to make decisions on diuretic based on day to day and week to week weight changes, maybe we should just give them a flat weight of 200 pounds for the diuretic dose adjustment. So: Torsemide 80 mg daily if weight under 200 lb. Torsemide 120 mg daily if weight over 200 lb. Call MD if weight reaches 205 lb. What do you think? MCr

## 2018-05-13 NOTE — Progress Notes (Signed)
I like it  Chelley, Colette Ribas, can you fax the orders to [facility]? Thanks

## 2018-05-16 ENCOUNTER — Telehealth: Payer: Self-pay | Admitting: Physician Assistant

## 2018-05-16 NOTE — Telephone Encounter (Signed)
Spoke with nurse taking care of [name] [name] and pt is to be taking Furosemide 80 mg am and if weight is above 195 to take additional 80 mg at 4:00 pm Also pt to have BMET tomorrow unable to do today as lab comes in am Will fax results of labs to our office once complete ./cy  Will fax televisit on Wednesday when in office ./cy

## 2018-05-16 NOTE — Telephone Encounter (Signed)
New Message   [name] [name] from [facility] [place] said  The nurse from her office, during the tele visit said that [name] [name] is going to send orders  And they also need any notes from the virtual visit   Fax number [phone]

## 2018-05-17 DIAGNOSIS — I5032 Chronic diastolic (congestive) heart failure: Secondary | ICD-10-CM | POA: Diagnosis not present

## 2018-05-17 DIAGNOSIS — D649 Anemia, unspecified: Secondary | ICD-10-CM | POA: Diagnosis not present

## 2018-05-18 NOTE — Telephone Encounter (Signed)
Tele visit faxed as requested ./cy

## 2018-05-18 NOTE — Telephone Encounter (Deleted)
Spoke with pt and for about 4 weeks has had heartburn Pt has been taking tums and has not changed so has started taking Nexium for about  2weeks pt has noted SOB , belching, tingling ,and also more discomfort at night when lying flat.Will discuss with Dr Kelly./cy

## 2018-05-19 DIAGNOSIS — I480 Paroxysmal atrial fibrillation: Secondary | ICD-10-CM | POA: Diagnosis not present

## 2018-05-19 DIAGNOSIS — I2510 Atherosclerotic heart disease of native coronary artery without angina pectoris: Secondary | ICD-10-CM | POA: Diagnosis not present

## 2018-05-19 DIAGNOSIS — J449 Chronic obstructive pulmonary disease, unspecified: Secondary | ICD-10-CM | POA: Diagnosis not present

## 2018-05-19 DIAGNOSIS — I10 Essential (primary) hypertension: Secondary | ICD-10-CM | POA: Diagnosis not present

## 2018-05-27 DIAGNOSIS — I5030 Unspecified diastolic (congestive) heart failure: Secondary | ICD-10-CM | POA: Diagnosis not present

## 2018-05-27 DIAGNOSIS — I480 Paroxysmal atrial fibrillation: Secondary | ICD-10-CM | POA: Diagnosis not present

## 2018-05-27 DIAGNOSIS — I10 Essential (primary) hypertension: Secondary | ICD-10-CM | POA: Diagnosis not present

## 2018-05-27 DIAGNOSIS — I2510 Atherosclerotic heart disease of native coronary artery without angina pectoris: Secondary | ICD-10-CM | POA: Diagnosis not present

## 2018-05-31 DIAGNOSIS — D649 Anemia, unspecified: Secondary | ICD-10-CM | POA: Diagnosis not present

## 2018-05-31 DIAGNOSIS — I110 Hypertensive heart disease with heart failure: Secondary | ICD-10-CM | POA: Diagnosis not present

## 2018-06-03 DIAGNOSIS — R41841 Cognitive communication deficit: Secondary | ICD-10-CM | POA: Diagnosis not present

## 2018-06-03 DIAGNOSIS — R278 Other lack of coordination: Secondary | ICD-10-CM | POA: Diagnosis not present

## 2018-06-06 DIAGNOSIS — R278 Other lack of coordination: Secondary | ICD-10-CM | POA: Diagnosis not present

## 2018-06-06 DIAGNOSIS — D649 Anemia, unspecified: Secondary | ICD-10-CM | POA: Diagnosis not present

## 2018-06-06 DIAGNOSIS — R41841 Cognitive communication deficit: Secondary | ICD-10-CM | POA: Diagnosis not present

## 2018-06-06 DIAGNOSIS — I5032 Chronic diastolic (congestive) heart failure: Secondary | ICD-10-CM | POA: Diagnosis not present

## 2018-06-07 DIAGNOSIS — I2510 Atherosclerotic heart disease of native coronary artery without angina pectoris: Secondary | ICD-10-CM | POA: Diagnosis not present

## 2018-06-07 DIAGNOSIS — R278 Other lack of coordination: Secondary | ICD-10-CM | POA: Diagnosis not present

## 2018-06-07 DIAGNOSIS — R410 Disorientation, unspecified: Secondary | ICD-10-CM | POA: Diagnosis not present

## 2018-06-07 DIAGNOSIS — F329 Major depressive disorder, single episode, unspecified: Secondary | ICD-10-CM | POA: Diagnosis not present

## 2018-06-07 DIAGNOSIS — I5030 Unspecified diastolic (congestive) heart failure: Secondary | ICD-10-CM | POA: Diagnosis not present

## 2018-06-07 DIAGNOSIS — R41841 Cognitive communication deficit: Secondary | ICD-10-CM | POA: Diagnosis not present

## 2018-06-08 DIAGNOSIS — E039 Hypothyroidism, unspecified: Secondary | ICD-10-CM | POA: Diagnosis not present

## 2018-06-08 DIAGNOSIS — D519 Vitamin B12 deficiency anemia, unspecified: Secondary | ICD-10-CM | POA: Diagnosis not present

## 2018-06-08 DIAGNOSIS — R278 Other lack of coordination: Secondary | ICD-10-CM | POA: Diagnosis not present

## 2018-06-08 DIAGNOSIS — D649 Anemia, unspecified: Secondary | ICD-10-CM | POA: Diagnosis not present

## 2018-06-08 DIAGNOSIS — E559 Vitamin D deficiency, unspecified: Secondary | ICD-10-CM | POA: Diagnosis not present

## 2018-06-08 DIAGNOSIS — I10 Essential (primary) hypertension: Secondary | ICD-10-CM | POA: Diagnosis not present

## 2018-06-08 DIAGNOSIS — R41841 Cognitive communication deficit: Secondary | ICD-10-CM | POA: Diagnosis not present

## 2018-06-09 DIAGNOSIS — R41841 Cognitive communication deficit: Secondary | ICD-10-CM | POA: Diagnosis not present

## 2018-06-09 DIAGNOSIS — R278 Other lack of coordination: Secondary | ICD-10-CM | POA: Diagnosis not present

## 2018-06-10 DIAGNOSIS — R41841 Cognitive communication deficit: Secondary | ICD-10-CM | POA: Diagnosis not present

## 2018-06-10 DIAGNOSIS — R278 Other lack of coordination: Secondary | ICD-10-CM | POA: Diagnosis not present

## 2018-06-13 DIAGNOSIS — R41841 Cognitive communication deficit: Secondary | ICD-10-CM | POA: Diagnosis not present

## 2018-06-13 DIAGNOSIS — R278 Other lack of coordination: Secondary | ICD-10-CM | POA: Diagnosis not present

## 2018-06-13 DIAGNOSIS — Z9181 History of falling: Secondary | ICD-10-CM | POA: Diagnosis not present

## 2018-06-13 DIAGNOSIS — F0281 Dementia in other diseases classified elsewhere with behavioral disturbance: Secondary | ICD-10-CM | POA: Diagnosis not present

## 2018-06-14 DIAGNOSIS — R278 Other lack of coordination: Secondary | ICD-10-CM | POA: Diagnosis not present

## 2018-06-14 DIAGNOSIS — R41841 Cognitive communication deficit: Secondary | ICD-10-CM | POA: Diagnosis not present

## 2018-06-14 DIAGNOSIS — Z9181 History of falling: Secondary | ICD-10-CM | POA: Diagnosis not present

## 2018-06-14 DIAGNOSIS — F0281 Dementia in other diseases classified elsewhere with behavioral disturbance: Secondary | ICD-10-CM | POA: Diagnosis not present

## 2018-06-15 DIAGNOSIS — Z9181 History of falling: Secondary | ICD-10-CM | POA: Diagnosis not present

## 2018-06-15 DIAGNOSIS — R278 Other lack of coordination: Secondary | ICD-10-CM | POA: Diagnosis not present

## 2018-06-15 DIAGNOSIS — F0281 Dementia in other diseases classified elsewhere with behavioral disturbance: Secondary | ICD-10-CM | POA: Diagnosis not present

## 2018-06-15 DIAGNOSIS — R41841 Cognitive communication deficit: Secondary | ICD-10-CM | POA: Diagnosis not present

## 2018-06-16 DIAGNOSIS — F329 Major depressive disorder, single episode, unspecified: Secondary | ICD-10-CM | POA: Diagnosis not present

## 2018-06-16 DIAGNOSIS — I10 Essential (primary) hypertension: Secondary | ICD-10-CM | POA: Diagnosis not present

## 2018-06-16 DIAGNOSIS — I2510 Atherosclerotic heart disease of native coronary artery without angina pectoris: Secondary | ICD-10-CM | POA: Diagnosis not present

## 2018-06-16 DIAGNOSIS — F0281 Dementia in other diseases classified elsewhere with behavioral disturbance: Secondary | ICD-10-CM | POA: Diagnosis not present

## 2018-06-16 DIAGNOSIS — Z9181 History of falling: Secondary | ICD-10-CM | POA: Diagnosis not present

## 2018-06-16 DIAGNOSIS — R278 Other lack of coordination: Secondary | ICD-10-CM | POA: Diagnosis not present

## 2018-06-16 DIAGNOSIS — R41841 Cognitive communication deficit: Secondary | ICD-10-CM | POA: Diagnosis not present

## 2018-06-16 DIAGNOSIS — I480 Paroxysmal atrial fibrillation: Secondary | ICD-10-CM | POA: Diagnosis not present

## 2018-06-17 DIAGNOSIS — R278 Other lack of coordination: Secondary | ICD-10-CM | POA: Diagnosis not present

## 2018-06-17 DIAGNOSIS — R41841 Cognitive communication deficit: Secondary | ICD-10-CM | POA: Diagnosis not present

## 2018-06-17 DIAGNOSIS — Z9181 History of falling: Secondary | ICD-10-CM | POA: Diagnosis not present

## 2018-06-17 DIAGNOSIS — F0281 Dementia in other diseases classified elsewhere with behavioral disturbance: Secondary | ICD-10-CM | POA: Diagnosis not present

## 2018-06-18 DIAGNOSIS — Z9181 History of falling: Secondary | ICD-10-CM | POA: Diagnosis not present

## 2018-06-18 DIAGNOSIS — R278 Other lack of coordination: Secondary | ICD-10-CM | POA: Diagnosis not present

## 2018-06-18 DIAGNOSIS — R41841 Cognitive communication deficit: Secondary | ICD-10-CM | POA: Diagnosis not present

## 2018-06-18 DIAGNOSIS — F0281 Dementia in other diseases classified elsewhere with behavioral disturbance: Secondary | ICD-10-CM | POA: Diagnosis not present

## 2018-06-20 DIAGNOSIS — R41841 Cognitive communication deficit: Secondary | ICD-10-CM | POA: Diagnosis not present

## 2018-06-20 DIAGNOSIS — F0281 Dementia in other diseases classified elsewhere with behavioral disturbance: Secondary | ICD-10-CM | POA: Diagnosis not present

## 2018-06-20 DIAGNOSIS — Z9181 History of falling: Secondary | ICD-10-CM | POA: Diagnosis not present

## 2018-06-20 DIAGNOSIS — R278 Other lack of coordination: Secondary | ICD-10-CM | POA: Diagnosis not present

## 2018-06-21 ENCOUNTER — Encounter: Payer: Self-pay | Admitting: Cardiovascular Disease

## 2018-06-21 DIAGNOSIS — Z9181 History of falling: Secondary | ICD-10-CM | POA: Diagnosis not present

## 2018-06-21 DIAGNOSIS — R41841 Cognitive communication deficit: Secondary | ICD-10-CM | POA: Diagnosis not present

## 2018-06-21 DIAGNOSIS — I10 Essential (primary) hypertension: Secondary | ICD-10-CM | POA: Diagnosis not present

## 2018-06-21 DIAGNOSIS — F0281 Dementia in other diseases classified elsewhere with behavioral disturbance: Secondary | ICD-10-CM | POA: Diagnosis not present

## 2018-06-21 DIAGNOSIS — D649 Anemia, unspecified: Secondary | ICD-10-CM | POA: Diagnosis not present

## 2018-06-21 DIAGNOSIS — R278 Other lack of coordination: Secondary | ICD-10-CM | POA: Diagnosis not present

## 2018-06-22 DIAGNOSIS — R41841 Cognitive communication deficit: Secondary | ICD-10-CM | POA: Diagnosis not present

## 2018-06-22 DIAGNOSIS — Z9181 History of falling: Secondary | ICD-10-CM | POA: Diagnosis not present

## 2018-06-22 DIAGNOSIS — F0281 Dementia in other diseases classified elsewhere with behavioral disturbance: Secondary | ICD-10-CM | POA: Diagnosis not present

## 2018-06-22 DIAGNOSIS — R278 Other lack of coordination: Secondary | ICD-10-CM | POA: Diagnosis not present

## 2018-06-23 DIAGNOSIS — R41841 Cognitive communication deficit: Secondary | ICD-10-CM | POA: Diagnosis not present

## 2018-06-23 DIAGNOSIS — R278 Other lack of coordination: Secondary | ICD-10-CM | POA: Diagnosis not present

## 2018-06-23 DIAGNOSIS — F0281 Dementia in other diseases classified elsewhere with behavioral disturbance: Secondary | ICD-10-CM | POA: Diagnosis not present

## 2018-06-23 DIAGNOSIS — Z9181 History of falling: Secondary | ICD-10-CM | POA: Diagnosis not present

## 2018-06-24 DIAGNOSIS — F0281 Dementia in other diseases classified elsewhere with behavioral disturbance: Secondary | ICD-10-CM | POA: Diagnosis not present

## 2018-06-24 DIAGNOSIS — I2510 Atherosclerotic heart disease of native coronary artery without angina pectoris: Secondary | ICD-10-CM | POA: Diagnosis not present

## 2018-06-24 DIAGNOSIS — R41841 Cognitive communication deficit: Secondary | ICD-10-CM | POA: Diagnosis not present

## 2018-06-24 DIAGNOSIS — R278 Other lack of coordination: Secondary | ICD-10-CM | POA: Diagnosis not present

## 2018-06-24 DIAGNOSIS — Z9181 History of falling: Secondary | ICD-10-CM | POA: Diagnosis not present

## 2018-06-24 DIAGNOSIS — F329 Major depressive disorder, single episode, unspecified: Secondary | ICD-10-CM | POA: Diagnosis not present

## 2018-06-24 DIAGNOSIS — I10 Essential (primary) hypertension: Secondary | ICD-10-CM | POA: Diagnosis not present

## 2018-06-24 DIAGNOSIS — I480 Paroxysmal atrial fibrillation: Secondary | ICD-10-CM | POA: Diagnosis not present

## 2018-06-27 DIAGNOSIS — Z9181 History of falling: Secondary | ICD-10-CM | POA: Diagnosis not present

## 2018-06-27 DIAGNOSIS — F0281 Dementia in other diseases classified elsewhere with behavioral disturbance: Secondary | ICD-10-CM | POA: Diagnosis not present

## 2018-06-27 DIAGNOSIS — R278 Other lack of coordination: Secondary | ICD-10-CM | POA: Diagnosis not present

## 2018-06-27 DIAGNOSIS — R41841 Cognitive communication deficit: Secondary | ICD-10-CM | POA: Diagnosis not present

## 2018-06-28 DIAGNOSIS — F0281 Dementia in other diseases classified elsewhere with behavioral disturbance: Secondary | ICD-10-CM | POA: Diagnosis not present

## 2018-06-28 DIAGNOSIS — Z9181 History of falling: Secondary | ICD-10-CM | POA: Diagnosis not present

## 2018-06-28 DIAGNOSIS — R278 Other lack of coordination: Secondary | ICD-10-CM | POA: Diagnosis not present

## 2018-06-28 DIAGNOSIS — R41841 Cognitive communication deficit: Secondary | ICD-10-CM | POA: Diagnosis not present

## 2018-06-29 DIAGNOSIS — R41841 Cognitive communication deficit: Secondary | ICD-10-CM | POA: Diagnosis not present

## 2018-06-29 DIAGNOSIS — R278 Other lack of coordination: Secondary | ICD-10-CM | POA: Diagnosis not present

## 2018-06-29 DIAGNOSIS — F0281 Dementia in other diseases classified elsewhere with behavioral disturbance: Secondary | ICD-10-CM | POA: Diagnosis not present

## 2018-06-29 DIAGNOSIS — Z9181 History of falling: Secondary | ICD-10-CM | POA: Diagnosis not present

## 2018-06-30 DIAGNOSIS — F0281 Dementia in other diseases classified elsewhere with behavioral disturbance: Secondary | ICD-10-CM | POA: Diagnosis not present

## 2018-06-30 DIAGNOSIS — R278 Other lack of coordination: Secondary | ICD-10-CM | POA: Diagnosis not present

## 2018-06-30 DIAGNOSIS — Z9181 History of falling: Secondary | ICD-10-CM | POA: Diagnosis not present

## 2018-06-30 DIAGNOSIS — R41841 Cognitive communication deficit: Secondary | ICD-10-CM | POA: Diagnosis not present

## 2018-07-01 DIAGNOSIS — Z9181 History of falling: Secondary | ICD-10-CM | POA: Diagnosis not present

## 2018-07-01 DIAGNOSIS — R41841 Cognitive communication deficit: Secondary | ICD-10-CM | POA: Diagnosis not present

## 2018-07-01 DIAGNOSIS — F0281 Dementia in other diseases classified elsewhere with behavioral disturbance: Secondary | ICD-10-CM | POA: Diagnosis not present

## 2018-07-01 DIAGNOSIS — R278 Other lack of coordination: Secondary | ICD-10-CM | POA: Diagnosis not present

## 2018-07-02 DIAGNOSIS — F0281 Dementia in other diseases classified elsewhere with behavioral disturbance: Secondary | ICD-10-CM | POA: Diagnosis not present

## 2018-07-02 DIAGNOSIS — R278 Other lack of coordination: Secondary | ICD-10-CM | POA: Diagnosis not present

## 2018-07-02 DIAGNOSIS — R41841 Cognitive communication deficit: Secondary | ICD-10-CM | POA: Diagnosis not present

## 2018-07-02 DIAGNOSIS — Z9181 History of falling: Secondary | ICD-10-CM | POA: Diagnosis not present

## 2018-07-03 DIAGNOSIS — Z9181 History of falling: Secondary | ICD-10-CM | POA: Diagnosis not present

## 2018-07-03 DIAGNOSIS — R278 Other lack of coordination: Secondary | ICD-10-CM | POA: Diagnosis not present

## 2018-07-03 DIAGNOSIS — F0281 Dementia in other diseases classified elsewhere with behavioral disturbance: Secondary | ICD-10-CM | POA: Diagnosis not present

## 2018-07-03 DIAGNOSIS — R41841 Cognitive communication deficit: Secondary | ICD-10-CM | POA: Diagnosis not present

## 2018-07-04 DIAGNOSIS — Z9181 History of falling: Secondary | ICD-10-CM | POA: Diagnosis not present

## 2018-07-04 DIAGNOSIS — R278 Other lack of coordination: Secondary | ICD-10-CM | POA: Diagnosis not present

## 2018-07-04 DIAGNOSIS — R41841 Cognitive communication deficit: Secondary | ICD-10-CM | POA: Diagnosis not present

## 2018-07-04 DIAGNOSIS — F0281 Dementia in other diseases classified elsewhere with behavioral disturbance: Secondary | ICD-10-CM | POA: Diagnosis not present

## 2018-07-05 DIAGNOSIS — R41841 Cognitive communication deficit: Secondary | ICD-10-CM | POA: Diagnosis not present

## 2018-07-05 DIAGNOSIS — R278 Other lack of coordination: Secondary | ICD-10-CM | POA: Diagnosis not present

## 2018-07-05 DIAGNOSIS — F0281 Dementia in other diseases classified elsewhere with behavioral disturbance: Secondary | ICD-10-CM | POA: Diagnosis not present

## 2018-07-05 DIAGNOSIS — Z9181 History of falling: Secondary | ICD-10-CM | POA: Diagnosis not present

## 2018-07-06 DIAGNOSIS — F0281 Dementia in other diseases classified elsewhere with behavioral disturbance: Secondary | ICD-10-CM | POA: Diagnosis not present

## 2018-07-06 DIAGNOSIS — R278 Other lack of coordination: Secondary | ICD-10-CM | POA: Diagnosis not present

## 2018-07-06 DIAGNOSIS — R41841 Cognitive communication deficit: Secondary | ICD-10-CM | POA: Diagnosis not present

## 2018-07-06 DIAGNOSIS — Z9181 History of falling: Secondary | ICD-10-CM | POA: Diagnosis not present

## 2018-07-07 DIAGNOSIS — Z9181 History of falling: Secondary | ICD-10-CM | POA: Diagnosis not present

## 2018-07-07 DIAGNOSIS — R41841 Cognitive communication deficit: Secondary | ICD-10-CM | POA: Diagnosis not present

## 2018-07-07 DIAGNOSIS — F0281 Dementia in other diseases classified elsewhere with behavioral disturbance: Secondary | ICD-10-CM | POA: Diagnosis not present

## 2018-07-07 DIAGNOSIS — R278 Other lack of coordination: Secondary | ICD-10-CM | POA: Diagnosis not present

## 2018-07-08 DIAGNOSIS — Z9181 History of falling: Secondary | ICD-10-CM | POA: Diagnosis not present

## 2018-07-08 DIAGNOSIS — R41841 Cognitive communication deficit: Secondary | ICD-10-CM | POA: Diagnosis not present

## 2018-07-08 DIAGNOSIS — R278 Other lack of coordination: Secondary | ICD-10-CM | POA: Diagnosis not present

## 2018-07-08 DIAGNOSIS — F0281 Dementia in other diseases classified elsewhere with behavioral disturbance: Secondary | ICD-10-CM | POA: Diagnosis not present

## 2018-07-11 DIAGNOSIS — R278 Other lack of coordination: Secondary | ICD-10-CM | POA: Diagnosis not present

## 2018-07-11 DIAGNOSIS — Z9181 History of falling: Secondary | ICD-10-CM | POA: Diagnosis not present

## 2018-07-11 DIAGNOSIS — R41841 Cognitive communication deficit: Secondary | ICD-10-CM | POA: Diagnosis not present

## 2018-07-11 DIAGNOSIS — F0281 Dementia in other diseases classified elsewhere with behavioral disturbance: Secondary | ICD-10-CM | POA: Diagnosis not present

## 2018-07-12 DIAGNOSIS — Z9181 History of falling: Secondary | ICD-10-CM | POA: Diagnosis not present

## 2018-07-12 DIAGNOSIS — R41841 Cognitive communication deficit: Secondary | ICD-10-CM | POA: Diagnosis not present

## 2018-07-12 DIAGNOSIS — I480 Paroxysmal atrial fibrillation: Secondary | ICD-10-CM | POA: Diagnosis not present

## 2018-07-12 DIAGNOSIS — I10 Essential (primary) hypertension: Secondary | ICD-10-CM | POA: Diagnosis not present

## 2018-07-12 DIAGNOSIS — R278 Other lack of coordination: Secondary | ICD-10-CM | POA: Diagnosis not present

## 2018-07-12 DIAGNOSIS — I2510 Atherosclerotic heart disease of native coronary artery without angina pectoris: Secondary | ICD-10-CM | POA: Diagnosis not present

## 2018-07-12 DIAGNOSIS — F0281 Dementia in other diseases classified elsewhere with behavioral disturbance: Secondary | ICD-10-CM | POA: Diagnosis not present

## 2018-07-12 DIAGNOSIS — F329 Major depressive disorder, single episode, unspecified: Secondary | ICD-10-CM | POA: Diagnosis not present

## 2018-07-13 DIAGNOSIS — R278 Other lack of coordination: Secondary | ICD-10-CM | POA: Diagnosis not present

## 2018-07-13 DIAGNOSIS — R41841 Cognitive communication deficit: Secondary | ICD-10-CM | POA: Diagnosis not present

## 2018-07-14 DIAGNOSIS — R278 Other lack of coordination: Secondary | ICD-10-CM | POA: Diagnosis not present

## 2018-07-14 DIAGNOSIS — R41841 Cognitive communication deficit: Secondary | ICD-10-CM | POA: Diagnosis not present

## 2018-07-15 DIAGNOSIS — R278 Other lack of coordination: Secondary | ICD-10-CM | POA: Diagnosis not present

## 2018-07-15 DIAGNOSIS — R41841 Cognitive communication deficit: Secondary | ICD-10-CM | POA: Diagnosis not present

## 2018-07-17 DIAGNOSIS — R278 Other lack of coordination: Secondary | ICD-10-CM | POA: Diagnosis not present

## 2018-07-17 DIAGNOSIS — R41841 Cognitive communication deficit: Secondary | ICD-10-CM | POA: Diagnosis not present

## 2018-07-18 DIAGNOSIS — R41841 Cognitive communication deficit: Secondary | ICD-10-CM | POA: Diagnosis not present

## 2018-07-18 DIAGNOSIS — R278 Other lack of coordination: Secondary | ICD-10-CM | POA: Diagnosis not present

## 2018-07-19 DIAGNOSIS — R278 Other lack of coordination: Secondary | ICD-10-CM | POA: Diagnosis not present

## 2018-07-19 DIAGNOSIS — R41841 Cognitive communication deficit: Secondary | ICD-10-CM | POA: Diagnosis not present

## 2018-07-20 DIAGNOSIS — I480 Paroxysmal atrial fibrillation: Secondary | ICD-10-CM | POA: Diagnosis not present

## 2018-07-20 DIAGNOSIS — R41841 Cognitive communication deficit: Secondary | ICD-10-CM | POA: Diagnosis not present

## 2018-07-20 DIAGNOSIS — I2510 Atherosclerotic heart disease of native coronary artery without angina pectoris: Secondary | ICD-10-CM | POA: Diagnosis not present

## 2018-07-20 DIAGNOSIS — R278 Other lack of coordination: Secondary | ICD-10-CM | POA: Diagnosis not present

## 2018-07-20 DIAGNOSIS — I10 Essential (primary) hypertension: Secondary | ICD-10-CM | POA: Diagnosis not present

## 2018-07-20 DIAGNOSIS — F329 Major depressive disorder, single episode, unspecified: Secondary | ICD-10-CM | POA: Diagnosis not present

## 2018-07-21 DIAGNOSIS — R41841 Cognitive communication deficit: Secondary | ICD-10-CM | POA: Diagnosis not present

## 2018-07-21 DIAGNOSIS — R278 Other lack of coordination: Secondary | ICD-10-CM | POA: Diagnosis not present

## 2018-07-22 DIAGNOSIS — R278 Other lack of coordination: Secondary | ICD-10-CM | POA: Diagnosis not present

## 2018-07-22 DIAGNOSIS — R41841 Cognitive communication deficit: Secondary | ICD-10-CM | POA: Diagnosis not present

## 2018-07-25 DIAGNOSIS — R41841 Cognitive communication deficit: Secondary | ICD-10-CM | POA: Diagnosis not present

## 2018-07-25 DIAGNOSIS — R278 Other lack of coordination: Secondary | ICD-10-CM | POA: Diagnosis not present

## 2018-07-26 ENCOUNTER — Telehealth: Payer: Self-pay | Admitting: Cardiovascular Disease

## 2018-07-26 DIAGNOSIS — R278 Other lack of coordination: Secondary | ICD-10-CM | POA: Diagnosis not present

## 2018-07-26 DIAGNOSIS — R41841 Cognitive communication deficit: Secondary | ICD-10-CM | POA: Diagnosis not present

## 2018-07-26 NOTE — Telephone Encounter (Signed)
LVM, reminding pt of his appt for 07-27-18 with Dr C. I also ask pt if he would like a Virtual Visit or Office Visit?

## 2018-07-27 ENCOUNTER — Telehealth: Payer: Self-pay | Admitting: Cardiovascular Disease

## 2018-07-27 ENCOUNTER — Ambulatory Visit: Payer: Medicare Other | Admitting: Cardiovascular Disease

## 2018-07-27 ENCOUNTER — Telehealth: Payer: Self-pay | Admitting: *Deleted

## 2018-07-27 DIAGNOSIS — R41841 Cognitive communication deficit: Secondary | ICD-10-CM | POA: Diagnosis not present

## 2018-07-27 DIAGNOSIS — R278 Other lack of coordination: Secondary | ICD-10-CM | POA: Diagnosis not present

## 2018-07-27 NOTE — Telephone Encounter (Addendum)
Left a message for the patient's family to call back concerning his appointment for today with Dr. [name].

## 2018-07-27 NOTE — Telephone Encounter (Signed)
° °  Daughter called in regards to the patient's missed virtual appt on 07/15.  Daughter states that the patient is in a Nursing Home, and the NH is not allowing family members in to help with the visit.  If necessary for future virtual visits, they will need to be arranged directly with the facility. The NH is [facility], and the [place] number is [phone]. Please reach out to [place], she is the head nurse at the [facility]. You can also ask for Levada Dy, she is the Retail buyer for the facility.    If Dr. Loletha Grayer would like the patient to see a PA in office, please let the daughter know. There are restrictions as to why the patients are allowed to leave the facility.

## 2018-07-28 DIAGNOSIS — R41841 Cognitive communication deficit: Secondary | ICD-10-CM | POA: Diagnosis not present

## 2018-07-28 DIAGNOSIS — R278 Other lack of coordination: Secondary | ICD-10-CM | POA: Diagnosis not present

## 2018-07-28 NOTE — Telephone Encounter (Signed)
Noted in chart for next appointment. Thank you

## 2018-07-29 DIAGNOSIS — R41841 Cognitive communication deficit: Secondary | ICD-10-CM | POA: Diagnosis not present

## 2018-07-29 DIAGNOSIS — R278 Other lack of coordination: Secondary | ICD-10-CM | POA: Diagnosis not present

## 2018-07-30 DIAGNOSIS — R278 Other lack of coordination: Secondary | ICD-10-CM | POA: Diagnosis not present

## 2018-07-30 DIAGNOSIS — R41841 Cognitive communication deficit: Secondary | ICD-10-CM | POA: Diagnosis not present

## 2018-08-02 DIAGNOSIS — R41841 Cognitive communication deficit: Secondary | ICD-10-CM | POA: Diagnosis not present

## 2018-08-02 DIAGNOSIS — R278 Other lack of coordination: Secondary | ICD-10-CM | POA: Diagnosis not present

## 2018-08-03 DIAGNOSIS — R41841 Cognitive communication deficit: Secondary | ICD-10-CM | POA: Diagnosis not present

## 2018-08-03 DIAGNOSIS — R278 Other lack of coordination: Secondary | ICD-10-CM | POA: Diagnosis not present

## 2018-08-04 DIAGNOSIS — R41841 Cognitive communication deficit: Secondary | ICD-10-CM | POA: Diagnosis not present

## 2018-08-04 DIAGNOSIS — R278 Other lack of coordination: Secondary | ICD-10-CM | POA: Diagnosis not present

## 2018-08-05 DIAGNOSIS — R278 Other lack of coordination: Secondary | ICD-10-CM | POA: Diagnosis not present

## 2018-08-05 DIAGNOSIS — R41841 Cognitive communication deficit: Secondary | ICD-10-CM | POA: Diagnosis not present

## 2018-08-08 DIAGNOSIS — I10 Essential (primary) hypertension: Secondary | ICD-10-CM | POA: Diagnosis not present

## 2018-08-08 DIAGNOSIS — E782 Mixed hyperlipidemia: Secondary | ICD-10-CM | POA: Diagnosis not present

## 2018-08-09 DIAGNOSIS — R41841 Cognitive communication deficit: Secondary | ICD-10-CM | POA: Diagnosis not present

## 2018-08-09 DIAGNOSIS — R278 Other lack of coordination: Secondary | ICD-10-CM | POA: Diagnosis not present

## 2018-08-11 DIAGNOSIS — F329 Major depressive disorder, single episode, unspecified: Secondary | ICD-10-CM | POA: Diagnosis not present

## 2018-08-11 DIAGNOSIS — I480 Paroxysmal atrial fibrillation: Secondary | ICD-10-CM | POA: Diagnosis not present

## 2018-08-11 DIAGNOSIS — I2510 Atherosclerotic heart disease of native coronary artery without angina pectoris: Secondary | ICD-10-CM | POA: Diagnosis not present

## 2018-08-11 DIAGNOSIS — R41841 Cognitive communication deficit: Secondary | ICD-10-CM | POA: Diagnosis not present

## 2018-08-11 DIAGNOSIS — I10 Essential (primary) hypertension: Secondary | ICD-10-CM | POA: Diagnosis not present

## 2018-08-11 DIAGNOSIS — R278 Other lack of coordination: Secondary | ICD-10-CM | POA: Diagnosis not present

## 2018-08-12 DIAGNOSIS — R41841 Cognitive communication deficit: Secondary | ICD-10-CM | POA: Diagnosis not present

## 2018-08-12 DIAGNOSIS — R278 Other lack of coordination: Secondary | ICD-10-CM | POA: Diagnosis not present

## 2018-08-15 DIAGNOSIS — I10 Essential (primary) hypertension: Secondary | ICD-10-CM | POA: Diagnosis not present

## 2018-08-15 DIAGNOSIS — J449 Chronic obstructive pulmonary disease, unspecified: Secondary | ICD-10-CM | POA: Diagnosis not present

## 2018-08-15 DIAGNOSIS — I480 Paroxysmal atrial fibrillation: Secondary | ICD-10-CM | POA: Diagnosis not present

## 2018-08-15 DIAGNOSIS — I2510 Atherosclerotic heart disease of native coronary artery without angina pectoris: Secondary | ICD-10-CM | POA: Diagnosis not present

## 2018-08-16 DIAGNOSIS — R278 Other lack of coordination: Secondary | ICD-10-CM | POA: Diagnosis not present

## 2018-08-18 DIAGNOSIS — R278 Other lack of coordination: Secondary | ICD-10-CM | POA: Diagnosis not present

## 2018-08-19 DIAGNOSIS — R278 Other lack of coordination: Secondary | ICD-10-CM | POA: Diagnosis not present

## 2018-08-21 DIAGNOSIS — R278 Other lack of coordination: Secondary | ICD-10-CM | POA: Diagnosis not present

## 2018-08-22 DIAGNOSIS — R278 Other lack of coordination: Secondary | ICD-10-CM | POA: Diagnosis not present

## 2018-08-26 ENCOUNTER — Non-Acute Institutional Stay: Payer: Medicare Other | Admitting: Internal Medicine

## 2018-08-29 ENCOUNTER — Other Ambulatory Visit: Payer: Self-pay

## 2018-08-30 DIAGNOSIS — R278 Other lack of coordination: Secondary | ICD-10-CM | POA: Diagnosis not present

## 2018-08-31 DIAGNOSIS — R278 Other lack of coordination: Secondary | ICD-10-CM | POA: Diagnosis not present

## 2018-09-01 DIAGNOSIS — R278 Other lack of coordination: Secondary | ICD-10-CM | POA: Diagnosis not present

## 2018-09-02 DIAGNOSIS — R278 Other lack of coordination: Secondary | ICD-10-CM | POA: Diagnosis not present

## 2018-09-03 DIAGNOSIS — R278 Other lack of coordination: Secondary | ICD-10-CM | POA: Diagnosis not present

## 2018-09-05 DIAGNOSIS — R278 Other lack of coordination: Secondary | ICD-10-CM | POA: Diagnosis not present

## 2018-09-06 DIAGNOSIS — I480 Paroxysmal atrial fibrillation: Secondary | ICD-10-CM | POA: Diagnosis not present

## 2018-09-06 DIAGNOSIS — N400 Enlarged prostate without lower urinary tract symptoms: Secondary | ICD-10-CM | POA: Diagnosis not present

## 2018-09-06 DIAGNOSIS — F329 Major depressive disorder, single episode, unspecified: Secondary | ICD-10-CM | POA: Diagnosis not present

## 2018-09-06 DIAGNOSIS — I2510 Atherosclerotic heart disease of native coronary artery without angina pectoris: Secondary | ICD-10-CM | POA: Diagnosis not present

## 2018-09-09 DIAGNOSIS — R278 Other lack of coordination: Secondary | ICD-10-CM | POA: Diagnosis not present

## 2018-09-12 DIAGNOSIS — I10 Essential (primary) hypertension: Secondary | ICD-10-CM | POA: Diagnosis not present

## 2018-09-12 DIAGNOSIS — E782 Mixed hyperlipidemia: Secondary | ICD-10-CM | POA: Diagnosis not present

## 2018-09-13 DIAGNOSIS — R278 Other lack of coordination: Secondary | ICD-10-CM | POA: Diagnosis not present

## 2018-09-14 DIAGNOSIS — J449 Chronic obstructive pulmonary disease, unspecified: Secondary | ICD-10-CM | POA: Diagnosis not present

## 2018-09-14 DIAGNOSIS — I10 Essential (primary) hypertension: Secondary | ICD-10-CM | POA: Diagnosis not present

## 2018-09-14 DIAGNOSIS — I480 Paroxysmal atrial fibrillation: Secondary | ICD-10-CM | POA: Diagnosis not present

## 2018-09-14 DIAGNOSIS — I2510 Atherosclerotic heart disease of native coronary artery without angina pectoris: Secondary | ICD-10-CM | POA: Diagnosis not present

## 2018-09-14 DIAGNOSIS — R278 Other lack of coordination: Secondary | ICD-10-CM | POA: Diagnosis not present

## 2018-09-16 DIAGNOSIS — R278 Other lack of coordination: Secondary | ICD-10-CM | POA: Diagnosis not present

## 2018-09-18 DIAGNOSIS — R278 Other lack of coordination: Secondary | ICD-10-CM | POA: Diagnosis not present

## 2018-09-20 DIAGNOSIS — R278 Other lack of coordination: Secondary | ICD-10-CM | POA: Diagnosis not present

## 2018-09-21 ENCOUNTER — Telehealth: Payer: Self-pay | Admitting: Cardiovascular Disease

## 2018-09-21 NOTE — Telephone Encounter (Signed)
New Message:    Please call [name]. Pt has an appointment on Friday. She wants to know what the Nursing Home will need for his Virtual Visit please?

## 2018-09-22 NOTE — Telephone Encounter (Signed)
The call has been placed to the daughter who asked that we call Joy at the facility to give them an update on what is needed for the appointment tomorrow.  Joy has been asked to provide updated labs, a blood pressure and a weight. She asked that we call Heather tomorrow at [phone]

## 2018-09-22 NOTE — Telephone Encounter (Signed)
Virtual Visit Pre-Appointment Phone Call  "(Name), I am calling you today to discuss your upcoming appointment. We are currently trying to limit exposure to the virus that causes COVID-19 by seeing patients at home rather than in the office."  1. "What is the BEST phone number to call the day of the visit?" - include this in appointment notes  2. "Do you have or have access to (through a family member/friend) a smartphone with video capability that we can use for your visit?" a. If yes - list this number in appt notes as "cell" (if different from BEST phone #) and list the appointment type as a VIDEO visit in appointment notes b. If no - list the appointment type as a PHONE visit in appointment notes  3. Confirm consent - "In the setting of the current Covid19 crisis, you are scheduled for a (phone or video) visit with your provider on (date) at (time).  Just as we do with many in-office visits, in order for you to participate in this visit, we must obtain consent.  If you'd like, I can send this to your mychart (if signed up) or email for you to review.  Otherwise, I can obtain your verbal consent now.  All virtual visits are billed to your insurance company just like a normal visit would be.  By agreeing to a virtual visit, we'd like you to understand that the technology does not allow for your provider to perform an examination, and thus may limit your provider's ability to fully assess your condition. If your provider identifies any concerns that need to be evaluated in person, we will make arrangements to do so.  Finally, though the technology is pretty good, we cannot assure that it will always work on either your or our end, and in the setting of a video visit, we may have to convert it to a phone-only visit.  In either situation, we cannot ensure that we have a secure connection.  Are you willing to proceed?" Yes  4. Advise patient to be prepared - "Two hours prior to your appointment, go  ahead and check your blood pressure, pulse, oxygen saturation, and your weight (if you have the equipment to check those) and write them all down. When your visit starts, your provider will ask you for this information. If you have an Apple Watch or Kardia device, please plan to have heart rate information ready on the day of your appointment. Please have a pen and paper handy nearby the day of the visit as well."  5. Give patient instructions for MyChart download to smartphone OR Doximity/Doxy.me as below if video visit (depending on what platform provider is using)  6. Inform patient they will receive a phone call 15 minutes prior to their appointment time (may be from unknown caller ID) so they should be prepared to answer    TELEPHONE CALL NOTE  [name] [name] has been deemed a candidate for a follow-up tele-health visit to limit community exposure during the Covid-19 pandemic. I spoke with the patient via phone to ensure availability of phone/video source, confirm preferred email & phone number, and discuss instructions and expectations.  I reminded [name] [name] to be prepared with any vital sign and/or heart rhythm information that could potentially be obtained via home monitoring, at the time of his visit. I reminded [name] [name] to expect a phone call prior to his visit.  [name] Barker, RN 09/22/2018 1:42 PM   INSTRUCTIONS FOR DOWNLOADING THE  MYCHART APP TO SMARTPHONE  - The patient must first make sure to have activated MyChart and know their login information - If Apple, go to [employer] and type in MyChart in the search bar and download the app. If Android, ask patient to go to [employer] and type in [place] in the search bar and download the app. The app is free but as with any other app downloads, their phone may require them to verify saved payment information or Apple/Android password.  - The patient will need to then log into the app with their MyChart username and password, and  select [facility] as their healthcare provider to link the account. When it is time for your visit, go to the MyChart app, find appointments, and click Begin Video Visit. Be sure to Select Allow for your device to access the Microphone and Camera for your visit. You will then be connected, and your provider will be with you shortly.  **If they have any issues connecting, or need assistance please contact MyChart service desk [phone])**  **If using a computer, in order to ensure the best quality for their visit they will need to use either of the following Internet Browsers: [employer], or Google Chrome**  IF USING DOXIMITY or DOXY.ME - The patient will receive a link just prior to their visit by text.     FULL LENGTH CONSENT FOR TELE-HEALTH VISIT   I hereby voluntarily request, consent and authorize [facility] and its employed or contracted physicians, physician assistants, nurse practitioners or other licensed health care professionals (the Practitioner), to provide me with telemedicine health care services (the "Services") as deemed necessary by the treating Practitioner. I acknowledge and consent to receive the Services by the Practitioner via telemedicine. I understand that the telemedicine visit will involve communicating with the Practitioner through live audiovisual communication technology and the disclosure of certain medical information by electronic transmission. I acknowledge that I have been given the opportunity to request an in-person assessment or other available alternative prior to the telemedicine visit and am voluntarily participating in the telemedicine visit.  I understand that I have the right to withhold or withdraw my consent to the use of telemedicine in the course of my care at any time, without affecting my right to future care or treatment, and that the Practitioner or I may terminate the telemedicine visit at any time. I understand that I have  the right to inspect all information obtained and/or recorded in the course of the telemedicine visit and may receive copies of available information for a reasonable fee.  I understand that some of the potential risks of receiving the Services via telemedicine include:  [name] Delay or interruption in medical evaluation due to technological equipment failure or disruption; . Information transmitted may not be sufficient (e.g. poor resolution of images) to allow for appropriate medical decision making by the Practitioner; and/or  . In rare instances, security protocols could fail, causing a breach of personal health information.  Furthermore, I acknowledge that it is my responsibility to provide information about my medical history, conditions and care that is complete and accurate to the best of my ability. I acknowledge that Practitioner's advice, recommendations, and/or decision may be based on factors not within their control, such as incomplete or inaccurate data provided by me or distortions of diagnostic images or specimens that may result from electronic transmissions. I understand that the practice of medicine is not an exact science and that Practitioner makes  no warranties or guarantees regarding treatment outcomes. I acknowledge that I will receive a copy of this consent concurrently upon execution via email to the email address I last provided but may also request a printed copy by calling the office of [facility].    I understand that my insurance will be billed for this visit.   I have read or had this consent read to me. . I understand the contents of this consent, which adequately explains the benefits and risks of the Services being provided via telemedicine.  . I have been provided ample opportunity to ask questions regarding this consent and the Services and have had my questions answered to my satisfaction. . I give my informed consent for the services to be provided through the use  of telemedicine in my medical care  By participating in this telemedicine visit I agree to the above.

## 2018-09-23 ENCOUNTER — Encounter: Payer: Self-pay | Admitting: Cardiovascular Disease

## 2018-09-23 ENCOUNTER — Telehealth (INDEPENDENT_AMBULATORY_CARE_PROVIDER_SITE_OTHER): Payer: Medicare Other | Admitting: Cardiovascular Disease

## 2018-09-23 DIAGNOSIS — I10 Essential (primary) hypertension: Secondary | ICD-10-CM

## 2018-09-23 DIAGNOSIS — Z9861 Coronary angioplasty status: Secondary | ICD-10-CM

## 2018-09-23 DIAGNOSIS — I714 Abdominal aortic aneurysm, without rupture, unspecified: Secondary | ICD-10-CM

## 2018-09-23 DIAGNOSIS — R278 Other lack of coordination: Secondary | ICD-10-CM | POA: Diagnosis not present

## 2018-09-23 DIAGNOSIS — I4891 Unspecified atrial fibrillation: Secondary | ICD-10-CM

## 2018-09-23 DIAGNOSIS — I5032 Chronic diastolic (congestive) heart failure: Secondary | ICD-10-CM | POA: Diagnosis not present

## 2018-09-23 DIAGNOSIS — I2510 Atherosclerotic heart disease of native coronary artery without angina pectoris: Secondary | ICD-10-CM

## 2018-09-23 DIAGNOSIS — I480 Paroxysmal atrial fibrillation: Secondary | ICD-10-CM | POA: Diagnosis not present

## 2018-09-23 DIAGNOSIS — I25118 Atherosclerotic heart disease of native coronary artery with other forms of angina pectoris: Secondary | ICD-10-CM

## 2018-09-23 DIAGNOSIS — E7800 Pure hypercholesterolemia, unspecified: Secondary | ICD-10-CM

## 2018-09-23 DIAGNOSIS — J449 Chronic obstructive pulmonary disease, unspecified: Secondary | ICD-10-CM

## 2018-09-23 DIAGNOSIS — Z7901 Long term (current) use of anticoagulants: Secondary | ICD-10-CM

## 2018-09-23 NOTE — Progress Notes (Signed)
Virtual Visit via Telephone Note   This visit type was conducted due to national recommendations for restrictions regarding the COVID-19 Pandemic (e.g. social distancing) in an effort to limit this patient's exposure and mitigate transmission in our community.  Due to his co-morbid illnesses, this patient is at least at moderate risk for complications without adequate follow up.  This format is felt to be most appropriate for this patient at this time.  The patient did not have access to video technology/had technical difficulties with video requiring transitioning to audio format only (telephone).  All issues noted in this document were discussed and addressed.  No physical exam could be performed with this format.  Please refer to the patient's chart for his  consent to telehealth for [facility].   Date:  09/23/2018   ID:  [name], DOB 1925/07/08, MRN [id number]  Patient Location: [facility] Provider Location: Home  PCP:  Jodi Marble, MD  Cardiologist:  [name], MD  Electrophysiologist:  None   Evaluation Performed:  Follow-Up Visit  Chief Complaint:  CHF, CAD  History of Present Illness:    [name] [name] is a 85 y.o. male with CAD,COPD, chronic diastolic heart failure, history of paroxysmal atrial fibrillation with RVR, urinary retention with acute renal insufficiency/encephalopathy; most recent hospitalization for acute on chronic diastolic heart failure in August 2019.   He is a permanent resident at [facility] facility.  After a rather rocky course in 2019 (3 consecutive hospitalizations in July and August 2019), he has done better this year, without need for hospital visits or frequent diuretic dose adjustments. He is very sedentary due to balance problems. He can pivot from the scooter to the commode, otherwise is always seated.  The patient specifically denies any chest pain at rest exertion, dyspnea at rest or with exertion,  orthopnea, paroxysmal nocturnal dyspnea, syncope, palpitations, focal neurological deficits, intermittent claudication, lower extremity edema, unexplained weight gain, cough, hemoptysis or wheezing.   Cardiac cath on May 08, 2014 showed normal left ventricle systolic function and mild distal anterolateral hypokinesis. He had chronic total occlusion of the proximal LAD with distal filling via left to left and right-to-left collaterals. Mild to moderate lesions were seen in the circumflex and right coronary artery system and a new 95% stenosis in the distal right coronary artery just proximal to the ostium of the PDA with jeopardy to the LAD collaterals. He received a drug-eluting stent (3.518 mm Xience Alpine) to the right coronary artery.  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  Attempts to wean off isosorbide led to recurrence of chest discomfort.  The patient does not have symptoms concerning for COVID-19 infection (fever, chills, cough, or new shortness of breath).    Past Medical History:  Diagnosis Date  . Anginal pain ([place])   . Arthritis    "all over"  . Cancer ([place])   . CHF (congestive heart failure) ([place])   . Coronary artery disease   . Dementia ([place])   . Depression ~ 84; ~ 1990   PTSD/[place]   . GERD (gastroesophageal reflux disease)   . High cholesterol   . Hypertension   . Hyperthyroidism   . Neuropathy   . Osteoporosis    Past Surgical History:  Procedure Laterality Date  . BACK SURGERY    . CARDIAC CATHETERIZATION  09/2007  . CARDIAC CATHETERIZATION N/A 05/08/2014   Procedure: CORONARY STENT INTERVENTION;  Surgeon: [name], MD; 3.518 mm Xience Alpine DES to the RCA   . LEFT HEART CATHETERIZATION WITH CORONARY ANGIOGRAM N/A 05/08/2014   Procedure: LEFT HEART CATHETERIZATION WITH CORONARY ANGIOGRAM;  Surgeon: [name], MD; LAD 100% (old), CFX 20%, OM1 30%, pRCA 30%, dRCA 95%, EF  55%     . LUMBAR LAMINECTOMY/DECOMPRESSION MICRODISCECTOMY  2011     Current Meds  Medication Sig  . acetaminophen (TYLENOL) 500 MG tablet Take 500-1,000 mg by mouth every 6 (six) hours as needed for mild pain.   [name] acyclovir (ZOVIRAX) 800 MG tablet Take 800 mg by mouth 4 (four) times daily.  [name] apixaban (ELIQUIS) 5 MG TABS tablet Take 1 tablet (5 mg total) by mouth 2 (two) times daily.  . Carboxymethylcellulose Sod PF 1 % GEL Place 1 drop into both eyes 2 (two) times daily.   . Cyanocobalamin (B-12 COMPLIANCE INJECTION) 1000 MCG/ML KIT Inject 1,000 mcg as directed See admin instructions. On the 20th of every month  . dextromethorphan-guaiFENesin (MUCINEX DM) 30-600 MG 12hr tablet Take 2 tablets by mouth 2 (two) times daily.  . finasteride (PROSCAR) 5 MG tablet Take 5 mg by mouth daily.  . isosorbide mononitrate (IMDUR) 30 MG 24 hr tablet Take 30 mg by mouth daily.  [name] levalbuterol (XOPENEX) 1.25 MG/0.5ML nebulizer solution Take 1.25 mg by nebulization 3 (three) times daily. (Patient taking differently: Take 1.25 mg by nebulization every 4 (four) hours. )  . lisinopril (PRINIVIL,ZESTRIL) 20 MG tablet Take 1 tablet (20 mg total) by mouth daily.  [name] loratadine (CLARITIN) 10 MG tablet Take 1 tablet (10 mg total) by mouth daily.  . metoprolol (LOPRESSOR) 100 MG tablet Take 100 mg by mouth 2 (two) times daily.  . Multiple Vitamins-Minerals (PRESERVISION AREDS 2+MULTI VIT) CAPS Take 1 capsule by mouth 2 (two) times daily.   [name] nystatin (NYSTATIN) powder Apply topically 3 (three) times daily. Affected areas on groin and buttocks  . pantoprazole (PROTONIX) 40 MG tablet Take 40 mg by mouth daily.  . sertraline (ZOLOFT) 25 MG tablet Take 25 mg by mouth daily.  . tamsulosin (FLOMAX) 0.4 MG CAPS capsule Take 1 capsule (0.4 mg total) by mouth daily after supper.  . torsemide (DEMADEX) 20 MG tablet Take 80 mg by mouth daily.  . traZODone (DESYREL) 50 MG tablet Take 75 mg by mouth at bedtime.   . Vitamin D,  Cholecalciferol, 1000 units TABS Take 1,000 Units by mouth at bedtime.     Allergies:   Cortisone   Social History   Tobacco Use  . Smoking status: Former Smoker    Packs/day: 1.00    Years: 20.00    Pack years: 20.00    Types: Cigarettes    Quit date: 01/12/1962    Years since quitting: 56.7  . Smokeless tobacco: Never Used  Substance Use Topics  . Alcohol use: No    Alcohol/week: 0.0 standard drinks    Comment: 05/08/2014 "he's been in [place] for ~!35 yrs"  . Drug use: No     Family Hx: The patient's family history includes Asthma in his mother; CAD in his mother.  ROS:   Please see the history of present illness.     All other systems reviewed and are negative.   Prior CV studies:   The following studies were reviewed today:  ECHO 08/2017 - Left ventricle: Systolic function was normal. The estimated   ejection fraction was in the range of 60% to 65%. Wall motion was  normal; there were no regional wall motion abnormalities. Doppler   parameters are consistent with indeterminate mean left atrial   filling pressure. - Aortic valve: There was trivial regurgitation. - Mitral valve: Valve area by pressure half-time: 2.1 cm^2. - Left atrium: The atrium was mildly dilated. - Pulmonary arteries: PA peak pressure: 33 mm Hg (S). - Pericardium, extracardiac: A trivial pericardial effusion was   identified.  Labs/Other Tests and Data Reviewed:    EKG:  An ECG dated 03/14/2018 was personally reviewed today and demonstrated:  sinus rhythm with PACs, old RBBB, septal infarction  Recent Labs: No results found for requested labs within last 8760 hours.   Recent Lipid Panel Lab Results  Component Value Date/Time   CHOL 111 05/10/2014 10:15 AM   TRIG 60 05/10/2014 10:15 AM   HDL 30 (L) 05/10/2014 10:15 AM   CHOLHDL 3.7 05/10/2014 10:15 AM   LDLCALC 69 05/10/2014 10:15 AM    Wt Readings from Last 3 Encounters:  09/23/18 196 lb 6.4 oz (89.1 kg)  05/13/18 198 lb (89.8 kg)   03/11/18 192 lb (87.1 kg)     Objective:    Vital Signs:  BP 128/78   Pulse 76   Ht 5' 11"  (1.803 m)   Wt 196 lb 6.4 oz (89.1 kg)   BMI 27.39 kg/m    VITAL SIGNS:  reviewed unable to examine  ASSESSMENT & PLAN:    1. AFib: No symptomatic events. He does not have a history of stroke or TIA but has high embolic risk.  CHADSVasc 5 (age 77, HTN, CAD, CHF).     2. Eliquis: No bleeding, falls/injuries. 3. CHF: He may have gained some true weight (unable to examine for signs of hypervolemia), appears to be well compensated. 4. CAD: No angina on current meds (and very sedentary status). Has chronic occlusion of the LAD artery and a stent placed in the right coronary artery in 2016. No angina and no further bradycardia after we stopped diltiazem. 5. HTN: since he rarely stands up, orthostatic hypotension has not been a problem recently. 6. HLP: It appears his atorvastatin was stopped. Will get a copy of the [place] MAR to make sure.  COVID-19 Education: The signs and symptoms of COVID-19 were discussed with the patient and how to seek care for testing (follow up with PCP or arrange E-visit).  The importance of social distancing was discussed today.  Time:   Today, I have spent 16 minutes with the patient with telehealth technology discussing the above problems.     Medication Adjustments/Labs and Tests Ordered: Current medicines are reviewed at length with the patient today.  Concerns regarding medicines are outlined above.   Tests Ordered: No orders of the defined types were placed in this encounter.   Medication Changes: No orders of the defined types were placed in this encounter.  Patient Instructions  Medication Instructions:  Your physician recommends that you continue on your current medications as directed. Please refer to the Current Medication list given to you today.  If you need a refill on your cardiac medications before your next appointment, please call your pharmacy.    Lab work: None ordered  If you have labs (blood work) drawn today and your tests are completely normal, you will receive your results only by: [place] (if you have MyChart) OR A paper copy in the mail If you have any lab test that is abnormal or we need to change your treatment, we will call you to review  the results.  Testing/Procedures: None ordered  Follow-Up: At [facility], you and your health needs are our priority.  As part of our continuing mission to provide you with exceptional heart care, we have created designated Provider Care Teams.  These Care Teams include your primary Cardiologist (physician) and Advanced Practice Providers (APPs -  Physician Assistants and Nurse Practitioners) who all work together to provide you with the care you need, when you need it. You will need a follow up appointment in 12 months.  Please call our office 2 months in advance to schedule this appointment.  You may see [name], MD or one of the following Advanced Practice Providers on your designated Care Team: Almyra Deforest, [place] Fabian Sharp, [place]  Any Other Special Instructions Will Be Listed Below (If Applicable). Please fax current medication list (MMR) to [phone] attention Lattie Haw         Follow Up:  Virtual Visit or In Person 1 year  Signed, [name], MD  09/23/2018 9:38 AM    [place]

## 2018-09-23 NOTE — Patient Instructions (Signed)
Medication Instructions:  Your physician recommends that you continue on your current medications as directed. Please refer to the Current Medication list given to you today.  If you need a refill on your cardiac medications before your next appointment, please call your pharmacy.   Lab work: None ordered  If you have labs (blood work) drawn today and your tests are completely normal, you will receive your results only by: [place] (if you have MyChart) OR A paper copy in the mail If you have any lab test that is abnormal or we need to change your treatment, we will call you to review the results.  Testing/Procedures: None ordered  Follow-Up: At [facility], you and your health needs are our priority.  As part of our continuing mission to provide you with exceptional heart care, we have created designated Provider Care Teams.  These Care Teams include your primary Cardiologist (physician) and Advanced Practice Providers (APPs -  Physician Assistants and Nurse Practitioners) who all work together to provide you with the care you need, when you need it. You will need a follow up appointment in 12 months.  Please call our office 2 months in advance to schedule this appointment.  You may see [name], MD or one of the following Advanced Practice Providers on your designated Care Team: Almyra Deforest, [place] Fabian Sharp, [place]  Any Other Special Instructions Will Be Listed Below (If Applicable). Please fax current medication list (MMR) to [phone] attention Lattie Haw

## 2018-10-03 DIAGNOSIS — U071 COVID-19: Secondary | ICD-10-CM | POA: Diagnosis not present

## 2018-10-04 DIAGNOSIS — I10 Essential (primary) hypertension: Secondary | ICD-10-CM | POA: Diagnosis not present

## 2018-10-04 DIAGNOSIS — I2510 Atherosclerotic heart disease of native coronary artery without angina pectoris: Secondary | ICD-10-CM | POA: Diagnosis not present

## 2018-10-04 DIAGNOSIS — I480 Paroxysmal atrial fibrillation: Secondary | ICD-10-CM | POA: Diagnosis not present

## 2018-10-04 DIAGNOSIS — J449 Chronic obstructive pulmonary disease, unspecified: Secondary | ICD-10-CM | POA: Diagnosis not present

## 2018-10-07 DIAGNOSIS — E782 Mixed hyperlipidemia: Secondary | ICD-10-CM | POA: Diagnosis not present

## 2018-10-07 DIAGNOSIS — I10 Essential (primary) hypertension: Secondary | ICD-10-CM | POA: Diagnosis not present

## 2018-10-11 DIAGNOSIS — R278 Other lack of coordination: Secondary | ICD-10-CM | POA: Diagnosis not present

## 2018-10-12 DIAGNOSIS — I10 Essential (primary) hypertension: Secondary | ICD-10-CM | POA: Diagnosis not present

## 2018-10-12 DIAGNOSIS — J449 Chronic obstructive pulmonary disease, unspecified: Secondary | ICD-10-CM | POA: Diagnosis not present

## 2018-10-12 DIAGNOSIS — I2510 Atherosclerotic heart disease of native coronary artery without angina pectoris: Secondary | ICD-10-CM | POA: Diagnosis not present

## 2018-10-12 DIAGNOSIS — R278 Other lack of coordination: Secondary | ICD-10-CM | POA: Diagnosis not present

## 2018-10-12 DIAGNOSIS — I480 Paroxysmal atrial fibrillation: Secondary | ICD-10-CM | POA: Diagnosis not present

## 2018-10-13 DIAGNOSIS — R278 Other lack of coordination: Secondary | ICD-10-CM | POA: Diagnosis not present

## 2018-10-13 DIAGNOSIS — U071 COVID-19: Secondary | ICD-10-CM | POA: Diagnosis not present

## 2018-10-16 DIAGNOSIS — R278 Other lack of coordination: Secondary | ICD-10-CM | POA: Diagnosis not present

## 2018-10-17 DIAGNOSIS — R278 Other lack of coordination: Secondary | ICD-10-CM | POA: Diagnosis not present

## 2018-10-18 DIAGNOSIS — R278 Other lack of coordination: Secondary | ICD-10-CM | POA: Diagnosis not present

## 2018-10-19 DIAGNOSIS — R278 Other lack of coordination: Secondary | ICD-10-CM | POA: Diagnosis not present

## 2018-10-20 DIAGNOSIS — R278 Other lack of coordination: Secondary | ICD-10-CM | POA: Diagnosis not present

## 2018-10-23 DIAGNOSIS — R278 Other lack of coordination: Secondary | ICD-10-CM | POA: Diagnosis not present

## 2018-10-24 DIAGNOSIS — R278 Other lack of coordination: Secondary | ICD-10-CM | POA: Diagnosis not present

## 2018-10-24 DIAGNOSIS — Z23 Encounter for immunization: Secondary | ICD-10-CM | POA: Diagnosis not present

## 2018-10-25 DIAGNOSIS — R278 Other lack of coordination: Secondary | ICD-10-CM | POA: Diagnosis not present

## 2018-10-26 DIAGNOSIS — R278 Other lack of coordination: Secondary | ICD-10-CM | POA: Diagnosis not present

## 2018-10-27 DIAGNOSIS — R278 Other lack of coordination: Secondary | ICD-10-CM | POA: Diagnosis not present

## 2018-10-30 DIAGNOSIS — R278 Other lack of coordination: Secondary | ICD-10-CM | POA: Diagnosis not present

## 2018-10-31 DIAGNOSIS — R278 Other lack of coordination: Secondary | ICD-10-CM | POA: Diagnosis not present

## 2018-11-01 DIAGNOSIS — R278 Other lack of coordination: Secondary | ICD-10-CM | POA: Diagnosis not present

## 2018-11-02 DIAGNOSIS — R278 Other lack of coordination: Secondary | ICD-10-CM | POA: Diagnosis not present

## 2018-11-02 DIAGNOSIS — U071 COVID-19: Secondary | ICD-10-CM | POA: Diagnosis not present

## 2018-11-03 ENCOUNTER — Other Ambulatory Visit: Payer: Self-pay

## 2018-11-03 ENCOUNTER — Non-Acute Institutional Stay: Payer: Medicare Other | Admitting: Internal Medicine

## 2018-11-03 DIAGNOSIS — I2510 Atherosclerotic heart disease of native coronary artery without angina pectoris: Secondary | ICD-10-CM | POA: Diagnosis not present

## 2018-11-03 DIAGNOSIS — L603 Nail dystrophy: Secondary | ICD-10-CM | POA: Diagnosis not present

## 2018-11-03 DIAGNOSIS — I480 Paroxysmal atrial fibrillation: Secondary | ICD-10-CM | POA: Diagnosis not present

## 2018-11-03 DIAGNOSIS — I10 Essential (primary) hypertension: Secondary | ICD-10-CM | POA: Diagnosis not present

## 2018-11-03 DIAGNOSIS — R278 Other lack of coordination: Secondary | ICD-10-CM | POA: Diagnosis not present

## 2018-11-04 DIAGNOSIS — I10 Essential (primary) hypertension: Secondary | ICD-10-CM | POA: Diagnosis not present

## 2018-11-04 DIAGNOSIS — E782 Mixed hyperlipidemia: Secondary | ICD-10-CM | POA: Diagnosis not present

## 2018-11-06 DIAGNOSIS — R278 Other lack of coordination: Secondary | ICD-10-CM | POA: Diagnosis not present

## 2018-11-07 DIAGNOSIS — R278 Other lack of coordination: Secondary | ICD-10-CM | POA: Diagnosis not present

## 2018-11-08 DIAGNOSIS — R278 Other lack of coordination: Secondary | ICD-10-CM | POA: Diagnosis not present

## 2018-11-09 DIAGNOSIS — I480 Paroxysmal atrial fibrillation: Secondary | ICD-10-CM | POA: Diagnosis not present

## 2018-11-09 DIAGNOSIS — F329 Major depressive disorder, single episode, unspecified: Secondary | ICD-10-CM | POA: Diagnosis not present

## 2018-11-09 DIAGNOSIS — I2510 Atherosclerotic heart disease of native coronary artery without angina pectoris: Secondary | ICD-10-CM | POA: Diagnosis not present

## 2018-11-09 DIAGNOSIS — I10 Essential (primary) hypertension: Secondary | ICD-10-CM | POA: Diagnosis not present

## 2018-11-13 DIAGNOSIS — R41841 Cognitive communication deficit: Secondary | ICD-10-CM | POA: Diagnosis not present

## 2018-11-13 DIAGNOSIS — R278 Other lack of coordination: Secondary | ICD-10-CM | POA: Diagnosis not present

## 2018-11-14 DIAGNOSIS — R278 Other lack of coordination: Secondary | ICD-10-CM | POA: Diagnosis not present

## 2018-11-14 DIAGNOSIS — R41841 Cognitive communication deficit: Secondary | ICD-10-CM | POA: Diagnosis not present

## 2018-11-15 DIAGNOSIS — R278 Other lack of coordination: Secondary | ICD-10-CM | POA: Diagnosis not present

## 2018-11-15 DIAGNOSIS — R41841 Cognitive communication deficit: Secondary | ICD-10-CM | POA: Diagnosis not present

## 2018-11-15 DIAGNOSIS — U071 COVID-19: Secondary | ICD-10-CM | POA: Diagnosis not present

## 2018-11-17 DIAGNOSIS — R41841 Cognitive communication deficit: Secondary | ICD-10-CM | POA: Diagnosis not present

## 2018-11-17 DIAGNOSIS — R278 Other lack of coordination: Secondary | ICD-10-CM | POA: Diagnosis not present

## 2018-11-18 DIAGNOSIS — U071 COVID-19: Secondary | ICD-10-CM | POA: Diagnosis not present

## 2018-11-20 DIAGNOSIS — R41841 Cognitive communication deficit: Secondary | ICD-10-CM | POA: Diagnosis not present

## 2018-11-20 DIAGNOSIS — R278 Other lack of coordination: Secondary | ICD-10-CM | POA: Diagnosis not present

## 2018-11-22 DIAGNOSIS — R41841 Cognitive communication deficit: Secondary | ICD-10-CM | POA: Diagnosis not present

## 2018-11-22 DIAGNOSIS — R278 Other lack of coordination: Secondary | ICD-10-CM | POA: Diagnosis not present

## 2018-11-23 DIAGNOSIS — R41841 Cognitive communication deficit: Secondary | ICD-10-CM | POA: Diagnosis not present

## 2018-11-23 DIAGNOSIS — R278 Other lack of coordination: Secondary | ICD-10-CM | POA: Diagnosis not present

## 2018-11-23 DIAGNOSIS — U071 COVID-19: Secondary | ICD-10-CM | POA: Diagnosis not present

## 2018-11-27 DIAGNOSIS — R278 Other lack of coordination: Secondary | ICD-10-CM | POA: Diagnosis not present

## 2018-11-27 DIAGNOSIS — R41841 Cognitive communication deficit: Secondary | ICD-10-CM | POA: Diagnosis not present

## 2018-11-28 DIAGNOSIS — R278 Other lack of coordination: Secondary | ICD-10-CM | POA: Diagnosis not present

## 2018-11-28 DIAGNOSIS — R41841 Cognitive communication deficit: Secondary | ICD-10-CM | POA: Diagnosis not present

## 2018-11-29 DIAGNOSIS — R41841 Cognitive communication deficit: Secondary | ICD-10-CM | POA: Diagnosis not present

## 2018-11-29 DIAGNOSIS — R278 Other lack of coordination: Secondary | ICD-10-CM | POA: Diagnosis not present

## 2018-11-30 DIAGNOSIS — R41841 Cognitive communication deficit: Secondary | ICD-10-CM | POA: Diagnosis not present

## 2018-11-30 DIAGNOSIS — U071 COVID-19: Secondary | ICD-10-CM | POA: Diagnosis not present

## 2018-11-30 DIAGNOSIS — R278 Other lack of coordination: Secondary | ICD-10-CM | POA: Diagnosis not present

## 2018-12-01 DIAGNOSIS — L814 Other melanin hyperpigmentation: Secondary | ICD-10-CM | POA: Diagnosis not present

## 2018-12-01 DIAGNOSIS — L821 Other seborrheic keratosis: Secondary | ICD-10-CM | POA: Diagnosis not present

## 2018-12-01 DIAGNOSIS — C44311 Basal cell carcinoma of skin of nose: Secondary | ICD-10-CM | POA: Diagnosis not present

## 2018-12-01 DIAGNOSIS — D1801 Hemangioma of skin and subcutaneous tissue: Secondary | ICD-10-CM | POA: Diagnosis not present

## 2018-12-01 DIAGNOSIS — R41841 Cognitive communication deficit: Secondary | ICD-10-CM | POA: Diagnosis not present

## 2018-12-01 DIAGNOSIS — R278 Other lack of coordination: Secondary | ICD-10-CM | POA: Diagnosis not present

## 2018-12-01 DIAGNOSIS — D485 Neoplasm of uncertain behavior of skin: Secondary | ICD-10-CM | POA: Diagnosis not present

## 2018-12-01 DIAGNOSIS — Z85828 Personal history of other malignant neoplasm of skin: Secondary | ICD-10-CM | POA: Diagnosis not present

## 2018-12-01 DIAGNOSIS — B351 Tinea unguium: Secondary | ICD-10-CM | POA: Diagnosis not present

## 2018-12-01 DIAGNOSIS — L570 Actinic keratosis: Secondary | ICD-10-CM | POA: Diagnosis not present

## 2018-12-05 DIAGNOSIS — R41841 Cognitive communication deficit: Secondary | ICD-10-CM | POA: Diagnosis not present

## 2018-12-05 DIAGNOSIS — R278 Other lack of coordination: Secondary | ICD-10-CM | POA: Diagnosis not present

## 2018-12-06 DIAGNOSIS — F329 Major depressive disorder, single episode, unspecified: Secondary | ICD-10-CM | POA: Diagnosis not present

## 2018-12-06 DIAGNOSIS — I10 Essential (primary) hypertension: Secondary | ICD-10-CM | POA: Diagnosis not present

## 2018-12-06 DIAGNOSIS — I2510 Atherosclerotic heart disease of native coronary artery without angina pectoris: Secondary | ICD-10-CM | POA: Diagnosis not present

## 2018-12-06 DIAGNOSIS — R41841 Cognitive communication deficit: Secondary | ICD-10-CM | POA: Diagnosis not present

## 2018-12-06 DIAGNOSIS — R278 Other lack of coordination: Secondary | ICD-10-CM | POA: Diagnosis not present

## 2018-12-06 DIAGNOSIS — I480 Paroxysmal atrial fibrillation: Secondary | ICD-10-CM | POA: Diagnosis not present

## 2018-12-06 DIAGNOSIS — U071 COVID-19: Secondary | ICD-10-CM | POA: Diagnosis not present

## 2018-12-07 DIAGNOSIS — R278 Other lack of coordination: Secondary | ICD-10-CM | POA: Diagnosis not present

## 2018-12-07 DIAGNOSIS — I10 Essential (primary) hypertension: Secondary | ICD-10-CM | POA: Diagnosis not present

## 2018-12-07 DIAGNOSIS — R41841 Cognitive communication deficit: Secondary | ICD-10-CM | POA: Diagnosis not present

## 2018-12-07 DIAGNOSIS — E782 Mixed hyperlipidemia: Secondary | ICD-10-CM | POA: Diagnosis not present

## 2018-12-12 DIAGNOSIS — R278 Other lack of coordination: Secondary | ICD-10-CM | POA: Diagnosis not present

## 2018-12-12 DIAGNOSIS — R41841 Cognitive communication deficit: Secondary | ICD-10-CM | POA: Diagnosis not present

## 2018-12-13 DIAGNOSIS — U071 COVID-19: Secondary | ICD-10-CM | POA: Diagnosis not present

## 2018-12-14 DIAGNOSIS — K219 Gastro-esophageal reflux disease without esophagitis: Secondary | ICD-10-CM | POA: Diagnosis not present

## 2018-12-14 DIAGNOSIS — I480 Paroxysmal atrial fibrillation: Secondary | ICD-10-CM | POA: Diagnosis not present

## 2018-12-14 DIAGNOSIS — I10 Essential (primary) hypertension: Secondary | ICD-10-CM | POA: Diagnosis not present

## 2018-12-14 DIAGNOSIS — J449 Chronic obstructive pulmonary disease, unspecified: Secondary | ICD-10-CM | POA: Diagnosis not present

## 2018-12-20 DIAGNOSIS — U071 COVID-19: Secondary | ICD-10-CM | POA: Diagnosis not present

## 2018-12-21 DIAGNOSIS — C44311 Basal cell carcinoma of skin of nose: Secondary | ICD-10-CM | POA: Diagnosis not present

## 2018-12-21 DIAGNOSIS — B351 Tinea unguium: Secondary | ICD-10-CM | POA: Diagnosis not present

## 2018-12-21 DIAGNOSIS — Z85828 Personal history of other malignant neoplasm of skin: Secondary | ICD-10-CM | POA: Diagnosis not present

## 2018-12-26 DIAGNOSIS — U071 COVID-19: Secondary | ICD-10-CM | POA: Diagnosis not present

## 2019-01-13 DIAGNOSIS — M6281 Muscle weakness (generalized): Secondary | ICD-10-CM | POA: Diagnosis not present

## 2019-01-13 DIAGNOSIS — R41841 Cognitive communication deficit: Secondary | ICD-10-CM | POA: Diagnosis not present

## 2019-01-14 DIAGNOSIS — M6281 Muscle weakness (generalized): Secondary | ICD-10-CM | POA: Diagnosis not present

## 2019-01-14 DIAGNOSIS — R41841 Cognitive communication deficit: Secondary | ICD-10-CM | POA: Diagnosis not present

## 2019-01-16 DIAGNOSIS — M6281 Muscle weakness (generalized): Secondary | ICD-10-CM | POA: Diagnosis not present

## 2019-01-16 DIAGNOSIS — R41841 Cognitive communication deficit: Secondary | ICD-10-CM | POA: Diagnosis not present

## 2019-01-16 DIAGNOSIS — U071 COVID-19: Secondary | ICD-10-CM | POA: Diagnosis not present

## 2019-01-17 DIAGNOSIS — R41841 Cognitive communication deficit: Secondary | ICD-10-CM | POA: Diagnosis not present

## 2019-01-17 DIAGNOSIS — M6281 Muscle weakness (generalized): Secondary | ICD-10-CM | POA: Diagnosis not present

## 2019-01-18 DIAGNOSIS — R41841 Cognitive communication deficit: Secondary | ICD-10-CM | POA: Diagnosis not present

## 2019-01-18 DIAGNOSIS — M6281 Muscle weakness (generalized): Secondary | ICD-10-CM | POA: Diagnosis not present

## 2019-01-19 DIAGNOSIS — R41841 Cognitive communication deficit: Secondary | ICD-10-CM | POA: Diagnosis not present

## 2019-01-19 DIAGNOSIS — M6281 Muscle weakness (generalized): Secondary | ICD-10-CM | POA: Diagnosis not present

## 2019-01-19 DIAGNOSIS — Z23 Encounter for immunization: Secondary | ICD-10-CM | POA: Diagnosis not present

## 2019-01-24 DIAGNOSIS — U071 COVID-19: Secondary | ICD-10-CM | POA: Diagnosis not present

## 2019-01-26 DIAGNOSIS — J449 Chronic obstructive pulmonary disease, unspecified: Secondary | ICD-10-CM | POA: Diagnosis not present

## 2019-01-26 DIAGNOSIS — I2510 Atherosclerotic heart disease of native coronary artery without angina pectoris: Secondary | ICD-10-CM | POA: Diagnosis not present

## 2019-01-26 DIAGNOSIS — U071 COVID-19: Secondary | ICD-10-CM | POA: Diagnosis not present

## 2019-01-26 DIAGNOSIS — J9601 Acute respiratory failure with hypoxia: Secondary | ICD-10-CM | POA: Diagnosis not present

## 2019-01-27 DIAGNOSIS — Z79899 Other long term (current) drug therapy: Secondary | ICD-10-CM | POA: Diagnosis not present

## 2019-01-27 DIAGNOSIS — D649 Anemia, unspecified: Secondary | ICD-10-CM | POA: Diagnosis not present

## 2019-01-28 DIAGNOSIS — E039 Hypothyroidism, unspecified: Secondary | ICD-10-CM | POA: Diagnosis not present

## 2019-01-28 DIAGNOSIS — D649 Anemia, unspecified: Secondary | ICD-10-CM | POA: Diagnosis not present

## 2019-01-28 DIAGNOSIS — I5032 Chronic diastolic (congestive) heart failure: Secondary | ICD-10-CM | POA: Diagnosis not present

## 2019-01-28 DIAGNOSIS — E785 Hyperlipidemia, unspecified: Secondary | ICD-10-CM | POA: Diagnosis not present

## 2019-02-01 DIAGNOSIS — I10 Essential (primary) hypertension: Secondary | ICD-10-CM | POA: Diagnosis not present

## 2019-02-01 DIAGNOSIS — E782 Mixed hyperlipidemia: Secondary | ICD-10-CM | POA: Diagnosis not present

## 2019-02-01 DIAGNOSIS — E559 Vitamin D deficiency, unspecified: Secondary | ICD-10-CM | POA: Diagnosis not present

## 2019-02-08 DIAGNOSIS — R2681 Unsteadiness on feet: Secondary | ICD-10-CM | POA: Diagnosis not present

## 2019-02-14 DIAGNOSIS — I480 Paroxysmal atrial fibrillation: Secondary | ICD-10-CM | POA: Diagnosis not present

## 2019-02-14 DIAGNOSIS — I10 Essential (primary) hypertension: Secondary | ICD-10-CM | POA: Diagnosis not present

## 2019-02-14 DIAGNOSIS — I5030 Unspecified diastolic (congestive) heart failure: Secondary | ICD-10-CM | POA: Diagnosis not present

## 2019-02-14 DIAGNOSIS — M6281 Muscle weakness (generalized): Secondary | ICD-10-CM | POA: Diagnosis not present

## 2019-02-14 DIAGNOSIS — U071 COVID-19: Secondary | ICD-10-CM | POA: Diagnosis not present

## 2019-02-15 DIAGNOSIS — M6281 Muscle weakness (generalized): Secondary | ICD-10-CM | POA: Diagnosis not present

## 2019-02-16 DIAGNOSIS — M6281 Muscle weakness (generalized): Secondary | ICD-10-CM | POA: Diagnosis not present

## 2019-02-17 DIAGNOSIS — M6281 Muscle weakness (generalized): Secondary | ICD-10-CM | POA: Diagnosis not present

## 2019-02-20 DIAGNOSIS — M6281 Muscle weakness (generalized): Secondary | ICD-10-CM | POA: Diagnosis not present

## 2019-02-21 DIAGNOSIS — M6281 Muscle weakness (generalized): Secondary | ICD-10-CM | POA: Diagnosis not present

## 2019-02-22 DIAGNOSIS — E782 Mixed hyperlipidemia: Secondary | ICD-10-CM | POA: Diagnosis not present

## 2019-02-22 DIAGNOSIS — E559 Vitamin D deficiency, unspecified: Secondary | ICD-10-CM | POA: Diagnosis not present

## 2019-02-22 DIAGNOSIS — M6281 Muscle weakness (generalized): Secondary | ICD-10-CM | POA: Diagnosis not present

## 2019-02-22 DIAGNOSIS — I10 Essential (primary) hypertension: Secondary | ICD-10-CM | POA: Diagnosis not present

## 2019-02-23 DIAGNOSIS — U071 COVID-19: Secondary | ICD-10-CM | POA: Diagnosis not present

## 2019-02-23 DIAGNOSIS — J449 Chronic obstructive pulmonary disease, unspecified: Secondary | ICD-10-CM | POA: Diagnosis not present

## 2019-02-23 DIAGNOSIS — I2510 Atherosclerotic heart disease of native coronary artery without angina pectoris: Secondary | ICD-10-CM | POA: Diagnosis not present

## 2019-02-23 DIAGNOSIS — I5030 Unspecified diastolic (congestive) heart failure: Secondary | ICD-10-CM | POA: Diagnosis not present

## 2019-02-23 DIAGNOSIS — M6281 Muscle weakness (generalized): Secondary | ICD-10-CM | POA: Diagnosis not present

## 2019-02-24 DIAGNOSIS — M6281 Muscle weakness (generalized): Secondary | ICD-10-CM | POA: Diagnosis not present

## 2019-02-27 DIAGNOSIS — M6281 Muscle weakness (generalized): Secondary | ICD-10-CM | POA: Diagnosis not present

## 2019-02-28 DIAGNOSIS — M6281 Muscle weakness (generalized): Secondary | ICD-10-CM | POA: Diagnosis not present

## 2019-03-01 DIAGNOSIS — M6281 Muscle weakness (generalized): Secondary | ICD-10-CM | POA: Diagnosis not present

## 2019-03-03 DIAGNOSIS — M6281 Muscle weakness (generalized): Secondary | ICD-10-CM | POA: Diagnosis not present

## 2019-03-04 DIAGNOSIS — M6281 Muscle weakness (generalized): Secondary | ICD-10-CM | POA: Diagnosis not present

## 2019-03-06 DIAGNOSIS — M6281 Muscle weakness (generalized): Secondary | ICD-10-CM | POA: Diagnosis not present

## 2019-03-07 DIAGNOSIS — M6281 Muscle weakness (generalized): Secondary | ICD-10-CM | POA: Diagnosis not present

## 2019-03-08 DIAGNOSIS — D649 Anemia, unspecified: Secondary | ICD-10-CM | POA: Diagnosis not present

## 2019-03-08 DIAGNOSIS — I5032 Chronic diastolic (congestive) heart failure: Secondary | ICD-10-CM | POA: Diagnosis not present

## 2019-03-08 DIAGNOSIS — I10 Essential (primary) hypertension: Secondary | ICD-10-CM | POA: Diagnosis not present

## 2019-03-08 DIAGNOSIS — M6281 Muscle weakness (generalized): Secondary | ICD-10-CM | POA: Diagnosis not present

## 2019-03-09 DIAGNOSIS — M6281 Muscle weakness (generalized): Secondary | ICD-10-CM | POA: Diagnosis not present

## 2019-03-10 DIAGNOSIS — M6281 Muscle weakness (generalized): Secondary | ICD-10-CM | POA: Diagnosis not present

## 2019-03-11 DIAGNOSIS — M6281 Muscle weakness (generalized): Secondary | ICD-10-CM | POA: Diagnosis not present

## 2019-03-14 DIAGNOSIS — M6281 Muscle weakness (generalized): Secondary | ICD-10-CM | POA: Diagnosis not present

## 2019-03-15 DIAGNOSIS — R2681 Unsteadiness on feet: Secondary | ICD-10-CM | POA: Diagnosis not present

## 2019-03-15 DIAGNOSIS — R269 Unspecified abnormalities of gait and mobility: Secondary | ICD-10-CM | POA: Diagnosis not present

## 2019-03-15 DIAGNOSIS — M6281 Muscle weakness (generalized): Secondary | ICD-10-CM | POA: Diagnosis not present

## 2019-03-15 DIAGNOSIS — I5031 Acute diastolic (congestive) heart failure: Secondary | ICD-10-CM | POA: Diagnosis not present

## 2019-03-15 DIAGNOSIS — I2510 Atherosclerotic heart disease of native coronary artery without angina pectoris: Secondary | ICD-10-CM | POA: Diagnosis not present

## 2019-03-16 DIAGNOSIS — M6281 Muscle weakness (generalized): Secondary | ICD-10-CM | POA: Diagnosis not present

## 2019-03-17 DIAGNOSIS — M6281 Muscle weakness (generalized): Secondary | ICD-10-CM | POA: Diagnosis not present

## 2019-03-18 DIAGNOSIS — M6281 Muscle weakness (generalized): Secondary | ICD-10-CM | POA: Diagnosis not present

## 2019-03-21 DIAGNOSIS — F329 Major depressive disorder, single episode, unspecified: Secondary | ICD-10-CM | POA: Diagnosis not present

## 2019-03-22 DIAGNOSIS — M6281 Muscle weakness (generalized): Secondary | ICD-10-CM | POA: Diagnosis not present

## 2019-03-23 DIAGNOSIS — I2510 Atherosclerotic heart disease of native coronary artery without angina pectoris: Secondary | ICD-10-CM | POA: Diagnosis not present

## 2019-03-23 DIAGNOSIS — I10 Essential (primary) hypertension: Secondary | ICD-10-CM | POA: Diagnosis not present

## 2019-03-23 DIAGNOSIS — I480 Paroxysmal atrial fibrillation: Secondary | ICD-10-CM | POA: Diagnosis not present

## 2019-03-23 DIAGNOSIS — J449 Chronic obstructive pulmonary disease, unspecified: Secondary | ICD-10-CM | POA: Diagnosis not present

## 2019-03-23 DIAGNOSIS — M6281 Muscle weakness (generalized): Secondary | ICD-10-CM | POA: Diagnosis not present

## 2019-03-24 DIAGNOSIS — M6281 Muscle weakness (generalized): Secondary | ICD-10-CM | POA: Diagnosis not present

## 2019-03-25 DIAGNOSIS — M6281 Muscle weakness (generalized): Secondary | ICD-10-CM | POA: Diagnosis not present

## 2019-03-27 DIAGNOSIS — M6281 Muscle weakness (generalized): Secondary | ICD-10-CM | POA: Diagnosis not present

## 2019-03-28 DIAGNOSIS — M6281 Muscle weakness (generalized): Secondary | ICD-10-CM | POA: Diagnosis not present

## 2019-03-29 DIAGNOSIS — M6281 Muscle weakness (generalized): Secondary | ICD-10-CM | POA: Diagnosis not present

## 2019-03-30 DIAGNOSIS — I10 Essential (primary) hypertension: Secondary | ICD-10-CM | POA: Diagnosis not present

## 2019-03-30 DIAGNOSIS — I480 Paroxysmal atrial fibrillation: Secondary | ICD-10-CM | POA: Diagnosis not present

## 2019-03-30 DIAGNOSIS — M6281 Muscle weakness (generalized): Secondary | ICD-10-CM | POA: Diagnosis not present

## 2019-03-30 DIAGNOSIS — Z23 Encounter for immunization: Secondary | ICD-10-CM | POA: Diagnosis not present

## 2019-03-30 DIAGNOSIS — J449 Chronic obstructive pulmonary disease, unspecified: Secondary | ICD-10-CM | POA: Diagnosis not present

## 2019-03-30 DIAGNOSIS — I2510 Atherosclerotic heart disease of native coronary artery without angina pectoris: Secondary | ICD-10-CM | POA: Diagnosis not present

## 2019-03-31 DIAGNOSIS — M6281 Muscle weakness (generalized): Secondary | ICD-10-CM | POA: Diagnosis not present

## 2019-04-01 DIAGNOSIS — M6281 Muscle weakness (generalized): Secondary | ICD-10-CM | POA: Diagnosis not present

## 2019-04-04 DIAGNOSIS — E559 Vitamin D deficiency, unspecified: Secondary | ICD-10-CM | POA: Diagnosis not present

## 2019-04-04 DIAGNOSIS — E782 Mixed hyperlipidemia: Secondary | ICD-10-CM | POA: Diagnosis not present

## 2019-04-04 DIAGNOSIS — I2510 Atherosclerotic heart disease of native coronary artery without angina pectoris: Secondary | ICD-10-CM | POA: Diagnosis not present

## 2019-04-04 DIAGNOSIS — I10 Essential (primary) hypertension: Secondary | ICD-10-CM | POA: Diagnosis not present

## 2019-04-04 DIAGNOSIS — M6281 Muscle weakness (generalized): Secondary | ICD-10-CM | POA: Diagnosis not present

## 2019-04-05 DIAGNOSIS — M6281 Muscle weakness (generalized): Secondary | ICD-10-CM | POA: Diagnosis not present

## 2019-04-06 DIAGNOSIS — M6281 Muscle weakness (generalized): Secondary | ICD-10-CM | POA: Diagnosis not present

## 2019-04-07 DIAGNOSIS — M6281 Muscle weakness (generalized): Secondary | ICD-10-CM | POA: Diagnosis not present

## 2019-04-08 DIAGNOSIS — M6281 Muscle weakness (generalized): Secondary | ICD-10-CM | POA: Diagnosis not present

## 2019-04-11 DIAGNOSIS — M6281 Muscle weakness (generalized): Secondary | ICD-10-CM | POA: Diagnosis not present

## 2019-04-12 DIAGNOSIS — R296 Repeated falls: Secondary | ICD-10-CM | POA: Diagnosis not present

## 2019-04-12 DIAGNOSIS — M6281 Muscle weakness (generalized): Secondary | ICD-10-CM | POA: Diagnosis not present

## 2019-04-12 DIAGNOSIS — I5030 Unspecified diastolic (congestive) heart failure: Secondary | ICD-10-CM | POA: Diagnosis not present

## 2019-04-12 DIAGNOSIS — I10 Essential (primary) hypertension: Secondary | ICD-10-CM | POA: Diagnosis not present

## 2019-04-12 DIAGNOSIS — I480 Paroxysmal atrial fibrillation: Secondary | ICD-10-CM | POA: Diagnosis not present

## 2019-04-13 DIAGNOSIS — M6281 Muscle weakness (generalized): Secondary | ICD-10-CM | POA: Diagnosis not present

## 2019-04-14 DIAGNOSIS — M6281 Muscle weakness (generalized): Secondary | ICD-10-CM | POA: Diagnosis not present

## 2019-04-17 DIAGNOSIS — M6281 Muscle weakness (generalized): Secondary | ICD-10-CM | POA: Diagnosis not present

## 2019-04-18 DIAGNOSIS — M6281 Muscle weakness (generalized): Secondary | ICD-10-CM | POA: Diagnosis not present

## 2019-04-19 DIAGNOSIS — M6281 Muscle weakness (generalized): Secondary | ICD-10-CM | POA: Diagnosis not present

## 2019-04-20 DIAGNOSIS — M6281 Muscle weakness (generalized): Secondary | ICD-10-CM | POA: Diagnosis not present

## 2019-04-21 DIAGNOSIS — M6281 Muscle weakness (generalized): Secondary | ICD-10-CM | POA: Diagnosis not present

## 2019-04-21 DIAGNOSIS — L821 Other seborrheic keratosis: Secondary | ICD-10-CM | POA: Diagnosis not present

## 2019-04-21 DIAGNOSIS — Z85828 Personal history of other malignant neoplasm of skin: Secondary | ICD-10-CM | POA: Diagnosis not present

## 2019-04-21 DIAGNOSIS — L814 Other melanin hyperpigmentation: Secondary | ICD-10-CM | POA: Diagnosis not present

## 2019-04-22 DIAGNOSIS — M6281 Muscle weakness (generalized): Secondary | ICD-10-CM | POA: Diagnosis not present

## 2019-04-25 DIAGNOSIS — M6281 Muscle weakness (generalized): Secondary | ICD-10-CM | POA: Diagnosis not present

## 2019-04-26 DIAGNOSIS — M6281 Muscle weakness (generalized): Secondary | ICD-10-CM | POA: Diagnosis not present

## 2019-04-29 DIAGNOSIS — M6281 Muscle weakness (generalized): Secondary | ICD-10-CM | POA: Diagnosis not present

## 2019-05-02 DIAGNOSIS — M6281 Muscle weakness (generalized): Secondary | ICD-10-CM | POA: Diagnosis not present

## 2019-05-04 DIAGNOSIS — R296 Repeated falls: Secondary | ICD-10-CM | POA: Diagnosis not present

## 2019-05-04 DIAGNOSIS — I10 Essential (primary) hypertension: Secondary | ICD-10-CM | POA: Diagnosis not present

## 2019-05-04 DIAGNOSIS — M6281 Muscle weakness (generalized): Secondary | ICD-10-CM | POA: Diagnosis not present

## 2019-05-04 DIAGNOSIS — I5030 Unspecified diastolic (congestive) heart failure: Secondary | ICD-10-CM | POA: Diagnosis not present

## 2019-05-04 DIAGNOSIS — J449 Chronic obstructive pulmonary disease, unspecified: Secondary | ICD-10-CM | POA: Diagnosis not present

## 2019-05-05 DIAGNOSIS — E782 Mixed hyperlipidemia: Secondary | ICD-10-CM | POA: Diagnosis not present

## 2019-05-05 DIAGNOSIS — I10 Essential (primary) hypertension: Secondary | ICD-10-CM | POA: Diagnosis not present

## 2019-05-05 DIAGNOSIS — E559 Vitamin D deficiency, unspecified: Secondary | ICD-10-CM | POA: Diagnosis not present

## 2019-05-05 DIAGNOSIS — I2510 Atherosclerotic heart disease of native coronary artery without angina pectoris: Secondary | ICD-10-CM | POA: Diagnosis not present

## 2019-05-06 DIAGNOSIS — M6281 Muscle weakness (generalized): Secondary | ICD-10-CM | POA: Diagnosis not present

## 2019-05-09 DIAGNOSIS — M6281 Muscle weakness (generalized): Secondary | ICD-10-CM | POA: Diagnosis not present

## 2019-05-10 DIAGNOSIS — I509 Heart failure, unspecified: Secondary | ICD-10-CM | POA: Diagnosis not present

## 2019-05-10 DIAGNOSIS — I10 Essential (primary) hypertension: Secondary | ICD-10-CM | POA: Diagnosis not present

## 2019-05-10 DIAGNOSIS — I480 Paroxysmal atrial fibrillation: Secondary | ICD-10-CM | POA: Diagnosis not present

## 2019-05-10 DIAGNOSIS — J449 Chronic obstructive pulmonary disease, unspecified: Secondary | ICD-10-CM | POA: Diagnosis not present

## 2019-05-11 DIAGNOSIS — M6281 Muscle weakness (generalized): Secondary | ICD-10-CM | POA: Diagnosis not present

## 2019-05-13 DIAGNOSIS — I110 Hypertensive heart disease with heart failure: Secondary | ICD-10-CM | POA: Diagnosis not present

## 2019-05-13 DIAGNOSIS — D649 Anemia, unspecified: Secondary | ICD-10-CM | POA: Diagnosis not present

## 2019-05-15 DIAGNOSIS — M6281 Muscle weakness (generalized): Secondary | ICD-10-CM | POA: Diagnosis not present

## 2019-05-16 DIAGNOSIS — M6281 Muscle weakness (generalized): Secondary | ICD-10-CM | POA: Diagnosis not present

## 2019-05-18 DIAGNOSIS — D649 Anemia, unspecified: Secondary | ICD-10-CM | POA: Diagnosis not present

## 2019-05-18 DIAGNOSIS — M6281 Muscle weakness (generalized): Secondary | ICD-10-CM | POA: Diagnosis not present

## 2019-05-18 DIAGNOSIS — I110 Hypertensive heart disease with heart failure: Secondary | ICD-10-CM | POA: Diagnosis not present

## 2019-05-19 DIAGNOSIS — M6281 Muscle weakness (generalized): Secondary | ICD-10-CM | POA: Diagnosis not present

## 2019-05-22 DIAGNOSIS — D649 Anemia, unspecified: Secondary | ICD-10-CM | POA: Diagnosis not present

## 2019-05-22 DIAGNOSIS — I5032 Chronic diastolic (congestive) heart failure: Secondary | ICD-10-CM | POA: Diagnosis not present

## 2019-05-23 DIAGNOSIS — M6281 Muscle weakness (generalized): Secondary | ICD-10-CM | POA: Diagnosis not present

## 2019-05-27 DIAGNOSIS — M6281 Muscle weakness (generalized): Secondary | ICD-10-CM | POA: Diagnosis not present

## 2019-05-31 DIAGNOSIS — M6281 Muscle weakness (generalized): Secondary | ICD-10-CM | POA: Diagnosis not present

## 2019-06-01 DIAGNOSIS — I2510 Atherosclerotic heart disease of native coronary artery without angina pectoris: Secondary | ICD-10-CM | POA: Diagnosis not present

## 2019-06-01 DIAGNOSIS — N400 Enlarged prostate without lower urinary tract symptoms: Secondary | ICD-10-CM | POA: Diagnosis not present

## 2019-06-01 DIAGNOSIS — M6281 Muscle weakness (generalized): Secondary | ICD-10-CM | POA: Diagnosis not present

## 2019-06-01 DIAGNOSIS — I480 Paroxysmal atrial fibrillation: Secondary | ICD-10-CM | POA: Diagnosis not present

## 2019-06-01 DIAGNOSIS — K219 Gastro-esophageal reflux disease without esophagitis: Secondary | ICD-10-CM | POA: Diagnosis not present

## 2019-06-02 DIAGNOSIS — M6281 Muscle weakness (generalized): Secondary | ICD-10-CM | POA: Diagnosis not present

## 2019-06-06 DIAGNOSIS — M6281 Muscle weakness (generalized): Secondary | ICD-10-CM | POA: Diagnosis not present

## 2019-06-07 DIAGNOSIS — I480 Paroxysmal atrial fibrillation: Secondary | ICD-10-CM | POA: Diagnosis not present

## 2019-06-07 DIAGNOSIS — I5032 Chronic diastolic (congestive) heart failure: Secondary | ICD-10-CM | POA: Diagnosis not present

## 2019-06-07 DIAGNOSIS — J449 Chronic obstructive pulmonary disease, unspecified: Secondary | ICD-10-CM | POA: Diagnosis not present

## 2019-06-07 DIAGNOSIS — I10 Essential (primary) hypertension: Secondary | ICD-10-CM | POA: Diagnosis not present

## 2019-06-09 DIAGNOSIS — M6281 Muscle weakness (generalized): Secondary | ICD-10-CM | POA: Diagnosis not present

## 2019-06-12 DIAGNOSIS — M6281 Muscle weakness (generalized): Secondary | ICD-10-CM | POA: Diagnosis not present

## 2019-06-26 DIAGNOSIS — R4182 Altered mental status, unspecified: Secondary | ICD-10-CM | POA: Diagnosis not present

## 2019-06-26 DIAGNOSIS — R400 Somnolence: Secondary | ICD-10-CM | POA: Diagnosis not present

## 2019-06-26 DIAGNOSIS — I480 Paroxysmal atrial fibrillation: Secondary | ICD-10-CM | POA: Diagnosis not present

## 2019-06-26 DIAGNOSIS — I5032 Chronic diastolic (congestive) heart failure: Secondary | ICD-10-CM | POA: Diagnosis not present

## 2019-07-06 ENCOUNTER — Telehealth: Payer: Self-pay | Admitting: Cardiovascular Disease

## 2019-07-06 NOTE — Telephone Encounter (Signed)
Routed to MD to advise on request for nephrologist

## 2019-07-06 NOTE — Telephone Encounter (Signed)
New Message:     Daughter called and said pt now need a Nephrologist. She would like for Dr C to refer or recommend one for the pt please.

## 2019-07-07 NOTE — Telephone Encounter (Signed)
Really anyone at [facility]. Dr. Joelyn Oms, Dr. Hollie Salk may have more openings, but all are good.

## 2019-07-07 NOTE — Telephone Encounter (Addendum)
Daughter of the patient had some follow-up questions for [place].She wanted to know whether the Nursing home needs to do any labs before the patient comes in.  Please call

## 2019-07-07 NOTE — Telephone Encounter (Signed)
Daughter aware of MD recommendation on nephrology providers. Patient has OV with Curt Bears NP on Tuesday 6/29 - advised to discuss then also. She will have lab work done at [facility] sent over. She is concerned about his HF and if he has PO fluid intake restrictions. Advised will be reviewed at upcoming appt

## 2019-07-07 NOTE — Telephone Encounter (Signed)
Spoke with daughter who is asking if nursing home needs to check labs. Advised that Curt Bears NP will order labs if needed, based on assessment. Suggested that any recent testing and labs be sent to our office

## 2019-07-10 NOTE — Progress Notes (Signed)
Cardiology Office Note   Date:  07/11/2019   ID:  [name] [name], DOB 04/28/1925, MRN [id number]  PCP:  Jodi Marble, MD  Cardiologist:  Dr.Croitoru  CC: Follow Up Daughter with multiple questions.   History of Present Illness: [name] [name] is a 86 y.o. male who presents for ongoing assessment and management of coronary artery disease, chronic diastolic heart failure, PAF with RVR, with other history to include COPD, urinary retention with acute renal insufficiency and encephalopathy.  On last office visit 09/23/2018, the patient was found to be very sedentary and not active due to balance problems.  He uses a wheelchair for ambulation.  Copied from Dr. Victorino December office.  Cardiac cath on May 08, 2014 showed normal left ventricle systolic function and mild distal anterolateral hypokinesis. He had chronic total occlusion of the proximal LAD with distal filling via left to left and right-to-left collaterals. Mild to moderate lesions were seen in the circumflex and right coronary artery system and a new 95% stenosis in the distal right coronary artery just proximal to the ostium of the PDA with jeopardy to the LAD collaterals. He received a drug-eluting stent (3.518 mm Xience Alpine) to the right coronary artery.  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  Attempts to wean off isosorbide led to recurrence of chest discomfort.  He is here today with his daughter. He remains a resident of [facility].  She has multiple questions concerning recent labs and X-ray completed at the SNF. She reports that he his having fluid retention per CXR completed at SNF.These records are not sent to [place] at the time of this appointment.   He complains of thirst and abdominal pain with trouble urinating. He also complains of dyspnea with minimal exertion. He is very sedentary.   I do have a copy of recent labs completed on 06/27/2019 The  patient's daughter requests that I go over this with her.   Na+ 141, Potassium 3.8, Cl-108 Creatinine 2.40 (increased from 2.0).   Past Medical History:  Diagnosis Date  . Anginal pain ([place])   . Arthritis    "all over"  . Cancer ([place])   . CHF (congestive heart failure) ([place])   . Coronary artery disease   . Dementia ([place])   . Depression ~ 9; ~ 1990   PTSD/[place]ia   . GERD (gastroesophageal reflux disease)   . High cholesterol   . Hypertension   . Hyperthyroidism   . Neuropathy   . Osteoporosis     Past Surgical History:  Procedure Laterality Date  . BACK SURGERY    . CARDIAC CATHETERIZATION  09/2007  . CARDIAC CATHETERIZATION N/A 05/08/2014   Procedure: CORONARY STENT INTERVENTION;  Surgeon: [name], MD; 3.518 mm Xience Alpine DES to the RCA   . LEFT HEART CATHETERIZATION WITH CORONARY ANGIOGRAM N/A 05/08/2014   Procedure: LEFT HEART CATHETERIZATION WITH CORONARY ANGIOGRAM;  Surgeon: [name], MD; LAD 100% (old), CFX 20%, OM1 30%, pRCA 30%, dRCA 95%, EF 55%     . LUMBAR LAMINECTOMY/DECOMPRESSION MICRODISCECTOMY  2011     Current Outpatient Medications  Medication Sig Dispense Refill  . acetaminophen (TYLENOL) 500 MG tablet Take 500-1,000 mg by mouth every 6 (six) hours as needed for mild pain.     [name] acyclovir (ZOVIRAX) 800 MG tablet Take 800 mg by mouth 4 (four) times daily.    [name] albuterol (VENTOLIN HFA) 108 (90 Base) MCG/ACT  inhaler Inhale 1 puff into the lungs daily as needed.    [name] amLODipine (NORVASC) 10 MG tablet Take 10 mg by mouth daily.    [name] apixaban (ELIQUIS) 2.5 MG TABS tablet Take 1 tablet (2.5 mg total) by mouth 2 (two) times daily. 180 tablet 3  . Carboxymethylcellulose Sod PF 1 % GEL Place 1 drop into both eyes 2 (two) times daily.     . Cyanocobalamin (B-12 COMPLIANCE INJECTION) 1000 MCG/ML KIT Inject 1,000 mcg as directed See admin instructions. On the 20th of every month    . finasteride (PROSCAR) 5 MG tablet Take 5 mg by mouth daily.    .  isosorbide mononitrate (IMDUR) 30 MG 24 hr tablet Take 30 mg by mouth daily.    [name] levalbuterol (XOPENEX) 1.25 MG/0.5ML nebulizer solution Take 1.25 mg by nebulization 3 (three) times daily. (Patient taking differently: Take 1.25 mg by nebulization every 4 (four) hours. ) 1 each 12  . lisinopril (PRINIVIL,ZESTRIL) 20 MG tablet Take 1 tablet (20 mg total) by mouth daily.    [name] loratadine (CLARITIN) 10 MG tablet Take 1 tablet (10 mg total) by mouth daily. 30 tablet 3  . meclizine (ANTIVERT) 12.5 MG tablet Take 12.5 mg by mouth every 4 (four) hours as needed for dizziness.    . metoprolol (LOPRESSOR) 100 MG tablet Take 100 mg by mouth 2 (two) times daily.    . Multiple Vitamins-Minerals (PRESERVISION AREDS 2+MULTI VIT) CAPS Take 1 capsule by mouth 2 (two) times daily.     [name] omeprazole (PRILOSEC) 20 MG capsule Take 20 mg by mouth daily.    . pantoprazole (PROTONIX) 40 MG tablet Take 40 mg by mouth daily.    . sertraline (ZOLOFT) 25 MG tablet Take 25 mg by mouth daily.    . tamsulosin (FLOMAX) 0.4 MG CAPS capsule Take 1 capsule (0.4 mg total) by mouth daily after supper. 30 capsule 0  . tiotropium (SPIRIVA HANDIHALER) 18 MCG inhalation capsule Place 1 capsule (18 mcg total) into inhaler and inhale daily. 30 capsule 2  . torsemide (DEMADEX) 20 MG tablet Take 80 mg by mouth daily.    . traZODone (DESYREL) 50 MG tablet Take 75 mg by mouth at bedtime.     . Vitamin D, Cholecalciferol, 1000 units TABS Take 1,000 Units by mouth at bedtime.     No current facility-administered medications for this visit.    Allergies:   Cortisone    Social History:  The patient  reports that he quit smoking about 57 years ago. His smoking use included cigarettes. He has a 20.00 pack-year smoking history. He has never used smokeless tobacco. He reports that he does not drink alcohol and does not use drugs.   Family History:  The patient's family history includes Asthma in his mother; CAD in his mother.    ROS: All other  systems are reviewed and negative. Unless otherwise mentioned in H&P    PHYSICAL EXAM: VS:  BP 124/82   Pulse (!) 113   Temp 98.1 F (36.7 C)   Ht _0  (1.803 m)   Wt 199 lb (90.3 kg)   SpO2 94%   BMI 27.75 kg/m  , BMI Body mass index is 27.75 kg/m. GEN: Well nourished, well developed, in no acute distress HEENT: normal Neck: no JVD, carotid bruits, or masses Cardiac: IRRR; 2/6 systolic murmurs, rubs, or gallops,no edema  Respiratory: Poor respiratory effort, diminished in the bases. No wheezes or crackles.  GI: soft, mild tenderness on palpation,  nondistended, + BS MS: no deformity or atrophy. Generalized deconditioning.  Skin: warm and dry, no rash, pale. Neuro:  Strength and sensation are intact, HOH Psych: euthymic mood, full affect   EKG: Sinus tachycardia rate of 113 bpm. RBBB, infero/anterior infarct, age undetermined. (Personally reviewed).  Recent Labs: No results found for requested labs within last 8760 hours.    Lipid Panel    Component Value Date/Time   CHOL 111 05/10/2014 1015   TRIG 60 05/10/2014 1015   HDL 30 (L) 05/10/2014 1015   CHOLHDL 3.7 05/10/2014 1015   VLDL 12 05/10/2014 1015   LDLCALC 69 05/10/2014 1015      Wt Readings from Last 3 Encounters:  07/11/19 199 lb (90.3 kg)  09/23/18 196 lb 6.4 oz (89.1 kg)  05/13/18 198 lb (89.8 kg)      Other studies Reviewed: ECHO 08/2017 - Left ventricle: Systolic function was normal. The estimated ejection fraction was in the range of 60% to 65%. Wall motion was normal; there were no regional wall motion abnormalities. Doppler parameters are consistent with indeterminate mean left atrial filling pressure. - Aortic valve: There was trivial regurgitation. - Mitral valve: Valve area by pressure half-time: 2.1 cm^2. - Left atrium: The atrium was mildly dilated. - Pulmonary arteries: PA peak pressure: 33 mm Hg (S). - Pericardium, extracardiac: A trivial pericardial effusion  was identified.   ASSESSMENT AND PLAN:  1. Atrial fib: Heart rate is elevated today. Lower on ascultation. I have recalculated his Eliquis dose due to his changes in Creatinine. Will reduce to 2.5 mg BID.heHe will continue on metoprolol 100 mg BID. I will recheck BMET today,   2. Chronic Diastolic CHF: He does not appear to be volume overloaded today. Daughter reports that he has fluid in his chest on recent CXR.  I am requesting the report.  Will check BNP with labs today. He is on torsemide 80 mg daily. We may need to decrease this dose depending on results of CXR and labs. Tachycardia may be related to mild dehydration vs pain in abdomen.   3.Hypertension: BP is well controlled today. He is not active and therefore cannot tell me if he gets dizzy standing up or walking.Low normal for someone of his age. Consider decreasing ACE if kidney function remains elevated. His daughter would like a repeat echocardiogram completed. I did explain to her that it is unlikely that the results will provide any new information. Last echo in 2019. Will repeat echo.  4. Abdominal Pain:  He had had issues with constipation and urinary retention. His daughter states that he has had to have urinary catheter placed in the past.  She will report this to SNF. He is to see nephrology, in upcoming appointment.  .   Current medicines are reviewed at length with the patient today.  I have spent 45 minutes with this patient and his daughter dedicated to the care of this patient on the date of this encounter to include pre-visit review of records, assessment, management and diagnostic testing,with shared decision making. Multiple questions answered and explanations are given.   Labs/ tests ordered today include: BMET, BNP, Echocardiogram  Phill Myron. [place] Pugh, ANP, AACC   07/11/2019 12:40 PM    [facility] [place] [address] Office [phone] Fax [phone]  Notice: This  dictation was prepared with Dragon dictation along with smaller phrase technology. Any transcriptional errors that result from this process are unintentional and may not be corrected upon review.

## 2019-07-11 ENCOUNTER — Other Ambulatory Visit: Payer: Self-pay

## 2019-07-11 ENCOUNTER — Encounter: Payer: Self-pay | Admitting: Adult Health

## 2019-07-11 ENCOUNTER — Emergency Department (HOSPITAL_COMMUNITY): Payer: Medicare Other

## 2019-07-11 ENCOUNTER — Ambulatory Visit (INDEPENDENT_AMBULATORY_CARE_PROVIDER_SITE_OTHER): Payer: Medicare Other | Admitting: Adult Health

## 2019-07-11 ENCOUNTER — Telehealth: Payer: Self-pay | Admitting: Cardiovascular Disease

## 2019-07-11 ENCOUNTER — Inpatient Hospital Stay (HOSPITAL_COMMUNITY)
Admission: EM | Admit: 2019-07-11 | Discharge: 2019-07-14 | DRG: 299 | Disposition: A | Discharge status: Skilled Nursing Facility | Payer: Medicare Other | Attending: Internal Medicine | Admitting: Internal Medicine

## 2019-07-11 VITALS — BP 124/82 | HR 113 | Temp 98.1°F | Ht 71.0 in | Wt 199.0 lb

## 2019-07-11 DIAGNOSIS — I712 Thoracic aortic aneurysm, without rupture: Secondary | ICD-10-CM | POA: Diagnosis not present

## 2019-07-11 DIAGNOSIS — I711 Thoracic aortic aneurysm, ruptured, unspecified: Secondary | ICD-10-CM | POA: Diagnosis present

## 2019-07-11 DIAGNOSIS — I10 Essential (primary) hypertension: Secondary | ICD-10-CM

## 2019-07-11 DIAGNOSIS — R1310 Dysphagia, unspecified: Secondary | ICD-10-CM | POA: Diagnosis present

## 2019-07-11 DIAGNOSIS — J90 Pleural effusion, not elsewhere classified: Secondary | ICD-10-CM | POA: Diagnosis not present

## 2019-07-11 DIAGNOSIS — Z888 Allergy status to other drugs, medicaments and biological substances status: Secondary | ICD-10-CM

## 2019-07-11 DIAGNOSIS — R0602 Shortness of breath: Secondary | ICD-10-CM

## 2019-07-11 DIAGNOSIS — F329 Major depressive disorder, single episode, unspecified: Secondary | ICD-10-CM | POA: Diagnosis present

## 2019-07-11 DIAGNOSIS — I4819 Other persistent atrial fibrillation: Secondary | ICD-10-CM | POA: Diagnosis present

## 2019-07-11 DIAGNOSIS — Z993 Dependence on wheelchair: Secondary | ICD-10-CM

## 2019-07-11 DIAGNOSIS — Z20822 Contact with and (suspected) exposure to covid-19: Secondary | ICD-10-CM | POA: Diagnosis present

## 2019-07-11 DIAGNOSIS — E876 Hypokalemia: Secondary | ICD-10-CM | POA: Diagnosis not present

## 2019-07-11 DIAGNOSIS — N139 Obstructive and reflux uropathy, unspecified: Secondary | ICD-10-CM

## 2019-07-11 DIAGNOSIS — K219 Gastro-esophageal reflux disease without esophagitis: Secondary | ICD-10-CM | POA: Diagnosis present

## 2019-07-11 DIAGNOSIS — K5641 Fecal impaction: Secondary | ICD-10-CM | POA: Diagnosis present

## 2019-07-11 DIAGNOSIS — D689 Coagulation defect, unspecified: Secondary | ICD-10-CM

## 2019-07-11 DIAGNOSIS — F4310 Post-traumatic stress disorder, unspecified: Secondary | ICD-10-CM | POA: Diagnosis present

## 2019-07-11 DIAGNOSIS — I519 Heart disease, unspecified: Secondary | ICD-10-CM | POA: Diagnosis not present

## 2019-07-11 DIAGNOSIS — Z9861 Coronary angioplasty status: Secondary | ICD-10-CM

## 2019-07-11 DIAGNOSIS — I480 Paroxysmal atrial fibrillation: Secondary | ICD-10-CM | POA: Diagnosis present

## 2019-07-11 DIAGNOSIS — Z515 Encounter for palliative care: Secondary | ICD-10-CM | POA: Diagnosis not present

## 2019-07-11 DIAGNOSIS — N184 Chronic kidney disease, stage 4 (severe): Secondary | ICD-10-CM | POA: Diagnosis present

## 2019-07-11 DIAGNOSIS — I25118 Atherosclerotic heart disease of native coronary artery with other forms of angina pectoris: Secondary | ICD-10-CM

## 2019-07-11 DIAGNOSIS — I130 Hypertensive heart and chronic kidney disease with heart failure and stage 1 through stage 4 chronic kidney disease, or unspecified chronic kidney disease: Secondary | ICD-10-CM | POA: Diagnosis present

## 2019-07-11 DIAGNOSIS — M961 Postlaminectomy syndrome, not elsewhere classified: Secondary | ICD-10-CM | POA: Diagnosis present

## 2019-07-11 DIAGNOSIS — J449 Chronic obstructive pulmonary disease, unspecified: Secondary | ICD-10-CM | POA: Diagnosis present

## 2019-07-11 DIAGNOSIS — N132 Hydronephrosis with renal and ureteral calculous obstruction: Secondary | ICD-10-CM | POA: Diagnosis present

## 2019-07-11 DIAGNOSIS — N179 Acute kidney failure, unspecified: Secondary | ICD-10-CM | POA: Diagnosis present

## 2019-07-11 DIAGNOSIS — Z79899 Other long term (current) drug therapy: Secondary | ICD-10-CM | POA: Diagnosis not present

## 2019-07-11 DIAGNOSIS — I5032 Chronic diastolic (congestive) heart failure: Secondary | ICD-10-CM

## 2019-07-11 DIAGNOSIS — I2510 Atherosclerotic heart disease of native coronary artery without angina pectoris: Secondary | ICD-10-CM | POA: Diagnosis present

## 2019-07-11 DIAGNOSIS — I509 Heart failure, unspecified: Secondary | ICD-10-CM | POA: Diagnosis not present

## 2019-07-11 DIAGNOSIS — M810 Age-related osteoporosis without current pathological fracture: Secondary | ICD-10-CM | POA: Diagnosis present

## 2019-07-11 DIAGNOSIS — Z87891 Personal history of nicotine dependence: Secondary | ICD-10-CM

## 2019-07-11 DIAGNOSIS — I716 Thoracoabdominal aortic aneurysm, without rupture: Principal | ICD-10-CM | POA: Diagnosis present

## 2019-07-11 DIAGNOSIS — F0390 Unspecified dementia without behavioral disturbance: Secondary | ICD-10-CM | POA: Diagnosis present

## 2019-07-11 DIAGNOSIS — E0590 Thyrotoxicosis, unspecified without thyrotoxic crisis or storm: Secondary | ICD-10-CM | POA: Diagnosis present

## 2019-07-11 DIAGNOSIS — E7800 Pure hypercholesterolemia, unspecified: Secondary | ICD-10-CM | POA: Diagnosis present

## 2019-07-11 DIAGNOSIS — N320 Bladder-neck obstruction: Secondary | ICD-10-CM | POA: Diagnosis present

## 2019-07-11 DIAGNOSIS — Z7189 Other specified counseling: Secondary | ICD-10-CM

## 2019-07-11 DIAGNOSIS — R0902 Hypoxemia: Secondary | ICD-10-CM | POA: Diagnosis present

## 2019-07-11 DIAGNOSIS — I482 Chronic atrial fibrillation, unspecified: Secondary | ICD-10-CM | POA: Diagnosis present

## 2019-07-11 DIAGNOSIS — Z8249 Family history of ischemic heart disease and other diseases of the circulatory system: Secondary | ICD-10-CM

## 2019-07-11 DIAGNOSIS — I5033 Acute on chronic diastolic (congestive) heart failure: Secondary | ICD-10-CM | POA: Diagnosis present

## 2019-07-11 DIAGNOSIS — N401 Enlarged prostate with lower urinary tract symptoms: Secondary | ICD-10-CM | POA: Diagnosis present

## 2019-07-11 DIAGNOSIS — Z66 Do not resuscitate: Secondary | ICD-10-CM | POA: Diagnosis not present

## 2019-07-11 DIAGNOSIS — Z825 Family history of asthma and other chronic lower respiratory diseases: Secondary | ICD-10-CM

## 2019-07-11 DIAGNOSIS — I714 Abdominal aortic aneurysm, without rupture: Secondary | ICD-10-CM | POA: Diagnosis not present

## 2019-07-11 DIAGNOSIS — N289 Disorder of kidney and ureter, unspecified: Secondary | ICD-10-CM

## 2019-07-11 DIAGNOSIS — G629 Polyneuropathy, unspecified: Secondary | ICD-10-CM | POA: Diagnosis present

## 2019-07-11 DIAGNOSIS — Z7901 Long term (current) use of anticoagulants: Secondary | ICD-10-CM

## 2019-07-11 LAB — BASIC METABOLIC PANEL
Anion gap: 16 — ABNORMAL HIGH (ref 5–15)
BUN: 50 mg/dL — ABNORMAL HIGH (ref 8–23)
CO2: 21 mmol/L — ABNORMAL LOW (ref 22–32)
Calcium: 8.9 mg/dL (ref 8.9–10.3)
Chloride: 102 mmol/L (ref 98–111)
Creatinine, Ser: 2.75 mg/dL — ABNORMAL HIGH (ref 0.61–1.24)
GFR calc Af Amer: 22 mL/min — ABNORMAL LOW (ref >60)
GFR calc non Af Amer: 19 mL/min — ABNORMAL LOW (ref >60)
Glucose, Bld: 161 mg/dL — ABNORMAL HIGH (ref 70–99)
Potassium: 3.4 mmol/L — ABNORMAL LOW (ref 3.5–5.1)
Sodium: 139 mmol/L (ref 135–145)

## 2019-07-11 LAB — TROPONIN I (HIGH SENSITIVITY)
Troponin I (High Sensitivity): 23 ng/L — ABNORMAL HIGH (ref <18)
Troponin I (High Sensitivity): 22 ng/L — ABNORMAL HIGH (ref <18)

## 2019-07-11 LAB — CBC
HCT: 34.4 % — ABNORMAL LOW (ref 39.0–52.0)
Hemoglobin: 11.3 g/dL — ABNORMAL LOW (ref 13.0–17.0)
MCH: 31.2 pg (ref 26.0–34.0)
MCHC: 32.8 g/dL (ref 30.0–36.0)
MCV: 95 fL (ref 80.0–100.0)
Platelets: 268 10*3/uL (ref 150–400)
RBC: 3.62 MIL/uL — ABNORMAL LOW (ref 4.22–5.81)
RDW: 13.2 % (ref 11.5–15.5)
WBC: 13.8 10*3/uL — ABNORMAL HIGH (ref 4.0–10.5)
nRBC: 0 % (ref 0.0–0.2)

## 2019-07-11 LAB — BRAIN NATRIURETIC PEPTIDE: B Natriuretic Peptide: 370.6 pg/mL — ABNORMAL HIGH (ref 0.0–100.0)

## 2019-07-11 LAB — CBG MONITORING, ED: Glucose-Capillary: 154 mg/dL — ABNORMAL HIGH (ref 70–99)

## 2019-07-11 MED ORDER — APIXABAN 2.5 MG PO TABS: 2.5000 mg | ORAL_TABLET | Freq: Two times a day (BID) | ORAL | 3 refills | Status: DC

## 2019-07-11 MED ORDER — SODIUM CHLORIDE 0.9% FLUSH: 3.0000 mL | Freq: Once | INTRAVENOUS | Status: DC

## 2019-07-11 NOTE — Telephone Encounter (Signed)
Returned the call to [place]. She stated that the patient had labs drawn this morning at his facility and the Creatinine was 2.34. The NP at the facility wanted to make sure that no new medications were added that would effect his creatnine.  She has been advised that the patient had labs drawn here today at his appointment as well and that no new medications were added.  Last week the patient's creatinine was 2.4. The NP had tried to decrease the Torsemide to 60 mg daily and the creatinine only went down to 2.14 so they increased back to 80 mg daily. She stated that the patient did not do well with the decrease.  She has been advised that we will call back if any changes need to be made.

## 2019-07-11 NOTE — ED Triage Notes (Signed)
Pt BIB [facility] from [place] home c/o generalized weakness.   EMS reports per facility, x1 emesis, noted 85-89% on room air, and weakness. Currently, denies nausea, denies pain, 94% on room air, and states generally not feeling well.   HX dementia. Per EMS, facility states A&Ox3 at baseline.

## 2019-07-11 NOTE — ED Notes (Signed)
Pt transported to XR.  

## 2019-07-11 NOTE — Progress Notes (Signed)
Thanks, Curt Bears. I agree with reducing the ACEi, rather than the diuretic. Symptom control is the priority in his case. I understand that "he did not do well" when they decreased his diuretic dose at [facility].

## 2019-07-11 NOTE — Telephone Encounter (Signed)
He has bad renal dysfunction, but stable. The dose of torsemide should be tailored to address his dyspnea and edema and the rnal function is of secondary concern. Continue torsemide 80 mg dose.

## 2019-07-11 NOTE — Patient Instructions (Signed)
Medication Instructions:  DECREASE- Eliquis 2.5 mg by mouth twice a day  *If you need a refill on your cardiac medications before your next appointment, please call your pharmacy*   Lab Work: BNP, BMP, CBC Today  If you have labs (blood work) drawn today and your tests are completely normal, you will receive your results only by: [name] MyChart Message (if you have MyChart) OR . A paper copy in the mail If you have any lab test that is abnormal or we need to change your treatment, we will call you to review the results.   Testing/Procedures: Your physician has requested that you have an echocardiogram. Echocardiography is a painless test that uses sound waves to create images of your heart. It provides your doctor with information about the size and shape of your heart and how well your heart's chambers and valves are working. This procedure takes approximately one hour. There are no restrictions for this procedure.   Follow-Up: At [facility], you and your health needs are our priority.  As part of our continuing mission to provide you with exceptional heart care, we have created designated Provider Care Teams.  These Care Teams include your primary Cardiologist (physician) and Advanced Practice Providers (APPs -  Physician Assistants and Nurse Practitioners) who all work together to provide you with the care you need, when you need it.  We recommend signing up for the patient portal called "MyChart".  Sign up information is provided on this After Visit Summary.  MyChart is used to connect with patients for Virtual Visits (Telemedicine).  Patients are able to view lab/test results, encounter notes, upcoming appointments, etc.  Non-urgent messages can be sent to your provider as well.   To learn more about what you can do with MyChart, go to [email or web address].    Your next appointment:   6 week(s)  The format for your next appointment:   In Person  Provider:   [name],  MD

## 2019-07-11 NOTE — Telephone Encounter (Signed)
New Message  Lynn Ito from [facility] is calling in to report patient's creatine level is at 2.34. Nurse practitioner would like to know if it is safe for it to be at this level with the medication that he is on. Please advise.

## 2019-07-11 NOTE — ED Provider Notes (Signed)
[facility] EMERGENCY DEPARTMENT Provider Note   CSN: [id number] Arrival date & time: 07/11/19  1957     History Chief Complaint  Patient presents with  . Weakness    [name] [name] is a 86 y.o. male.  Presents to ER with concern for generalized weakness.  History limited due to dementia, level 5 caveat.  History obtained via EMS report, concern for increased generalized weakness, patient had one episode of emesis, vital signs were checked and noted to be mildly hypoxic.  Patient denies any complaints with EMS.  Additional history obtained via discussion with daughter.  She reports that she is with him quite frequently.  States that seems to have decreased energy level today, more fatigued.  Has not noted any change in mental status, has not noted any increased work of breathing.  Had mentioned having some lower abdominal pain.  Concerned he may be constipated.  Additional history obtained via chart review, reviewed recent notes from cardiology.  Patient has CKD, has been referred to nephrology but patient has not been evaluated yet.  Recent creatinine 2.4.  NP at facility had decreased torsemide to 60 mg daily but recently went up to 80 mg daily.  Patient seen at cardiology office today.  They recommended decreasing ACE.  HPI     Past Medical History:  Diagnosis Date  . Anginal pain ([place])   . Arthritis    "all over"  . Cancer ([place])   . CHF (congestive heart failure) ([place])   . Coronary artery disease   . Dementia ([place])   . Depression ~ 22; ~ 1990   PTSD/Mania   . GERD (gastroesophageal reflux disease)   . High cholesterol   . Hypertension   . Hyperthyroidism   . Neuropathy   . Osteoporosis     Patient Active Problem List   Diagnosis Date Noted  . Coronary artery disease of native artery of native heart with stable angina pectoris ([place]) 11/25/2017  . Paroxysmal atrial fibrillation (H[facility])   . Pulmonary edema 09/02/2017  . Respiratory failure ([place])  09/02/2017  . Major neurocognitive disorder due to another medical condition with behavioral disturbance ([place]) 07/31/2017  . Long term current use of anticoagulant 07/30/2017  . COPD (chronic obstructive pulmonary disease) ([place]) 07/30/2017  . Acute encephalopathy 07/29/2017  . Pressure injury of skin 07/13/2017  . Atrial fibrillation with RVR ([place]) 07/09/2017  . Chronic diastolic heart failure ([place]) 07/09/2017  . AAA (abdominal aortic aneurysm) ([place]) 07/09/2017  . Acute on chronic diastolic congestive heart failure ([place]) 04/11/2017  . Atrial fibrillation, chronic ([place]) 04/11/2017  . Acute respiratory failure with hypoxia ([place]) 02/23/2017  . CAP (community acquired pneumonia) 02/23/2017  . Nausea & vomiting 02/23/2017  . PTSD (post-traumatic stress disorder)   . Vomiting and diarrhea 11/09/2014  . Unstable angina ([place]) 05/10/2014  . CAD S/P percutaneous coronary angioplasty   . Angina pectoris, crescendo ([place]) 04/28/2014  . Contusion of left hip 07/04/2013  . Fall 07/04/2013  . Benign prostatic hyperplasia 05/31/2013  . UTI (urinary tract infection) 05/31/2013  . Hypercholesterolemia 05/31/2013  . Dysphagia, pharyngoesophageal phase 04/10/2013  . Frequent falls 04/10/2013  . Weakness 04/10/2013  . Hyperthyroidism, subclinical 04/10/2013  . GERD (gastroesophageal reflux disease) 04/06/2013  . Bladder outflow obstruction 04/06/2013  . Dehydration 04/05/2013  . Essential hypertension 04/05/2013  . Arthritis 04/05/2013  . Peripheral neuropathy 04/05/2013  . Gastroenteritis, acute 04/05/2013  . Gastroenteritis 04/05/2013    Past Surgical History:  Procedure Laterality Date  . BACK  SURGERY    . CARDIAC CATHETERIZATION  09/2007  . CARDIAC CATHETERIZATION N/A 05/08/2014   Procedure: CORONARY STENT INTERVENTION;  Surgeon: [name], MD; 3.518 mm Xience Alpine DES to the RCA   . LEFT HEART CATHETERIZATION WITH CORONARY ANGIOGRAM N/A 05/08/2014   Procedure: LEFT HEART  CATHETERIZATION WITH CORONARY ANGIOGRAM;  Surgeon: [name], MD; LAD 100% (old), CFX 20%, OM1 30%, pRCA 30%, dRCA 95%, EF 55%     . LUMBAR LAMINECTOMY/DECOMPRESSION MICRODISCECTOMY  2011       Family History  Problem Relation Age of Onset  . CAD Mother   . Asthma Mother     Social History   Tobacco Use  . Smoking status: Former Smoker    Packs/day: 1.00    Years: 20.00    Pack years: 20.00    Types: Cigarettes    Quit date: 01/12/1962    Years since quitting: 57.5  . Smokeless tobacco: Never Used  Substance Use Topics  . Alcohol use: No    Alcohol/week: 0.0 standard drinks    Comment: 05/08/2014 "he's been in [place] for ~!35 yrs"  . Drug use: No    Home Medications Prior to Admission medications   Medication Sig Start Date End Date Taking? Authorizing Provider  acetaminophen (TYLENOL) 500 MG tablet Take 500-1,000 mg by mouth every 6 (six) hours as needed for mild pain.     [provider]  acyclovir (ZOVIRAX) 800 MG tablet Take 800 mg by mouth 4 (four) times daily.    [provider]  albuterol (VENTOLIN HFA) 108 (90 Base) MCG/ACT inhaler Inhale 1 puff into the lungs daily as needed. 03/15/19   [provider]  amLODipine (NORVASC) 10 MG tablet Take 10 mg by mouth daily. 07/04/19   [provider]  apixaban (ELIQUIS) 2.5 MG TABS tablet Take 1 tablet (2.5 mg total) by mouth 2 (two) times daily. 07/11/19   Lendon Colonel, NP  Carboxymethylcellulose Sod PF 1 % GEL Place 1 drop into both eyes 2 (two) times daily.     [provider]  Cyanocobalamin (B-12 COMPLIANCE INJECTION) 1000 MCG/ML KIT Inject 1,000 mcg as directed See admin instructions. On the 20th of every month    [provider]  finasteride (PROSCAR) 5 MG tablet Take 5 mg by mouth daily.    [provider]  isosorbide mononitrate (IMDUR) 30 MG 24 hr tablet Take 30 mg by mouth daily.    [provider]  levalbuterol (XOPENEX) 1.25 MG/0.5ML  nebulizer solution Take 1.25 mg by nebulization 3 (three) times daily. Patient taking differently: Take 1.25 mg by nebulization every 4 (four) hours.  07/16/17   Raiford Noble Latif, DO  lisinopril (PRINIVIL,ZESTRIL) 20 MG tablet Take 1 tablet (20 mg total) by mouth daily. 07/17/17   Raiford Noble Latif, DO  loratadine (CLARITIN) 10 MG tablet Take 1 tablet (10 mg total) by mouth daily. 02/28/17   Roxan Hockey, MD  meclizine (ANTIVERT) 12.5 MG tablet Take 12.5 mg by mouth every 4 (four) hours as needed for dizziness.    [provider]  metoprolol (LOPRESSOR) 100 MG tablet Take 100 mg by mouth 2 (two) times daily.    [provider]  Multiple Vitamins-Minerals (PRESERVISION AREDS 2+MULTI VIT) CAPS Take 1 capsule by mouth 2 (two) times daily.     [provider]  omeprazole (PRILOSEC) 20 MG capsule Take 20 mg by mouth daily. 06/27/19   [provider]  pantoprazole (PROTONIX) 40 MG tablet Take 40  mg by mouth daily.    [provider]  sertraline (ZOLOFT) 25 MG tablet Take 25 mg by mouth daily.    [provider]  tamsulosin (FLOMAX) 0.4 MG CAPS capsule Take 1 capsule (0.4 mg total) by mouth daily after supper. 12/02/15   Barrett, Evelene Croon, PA-C  tiotropium (SPIRIVA HANDIHALER) 18 MCG inhalation capsule Place 1 capsule (18 mcg total) into inhaler and inhale daily. 02/27/17 07/11/19  Roxan Hockey, MD  torsemide (DEMADEX) 20 MG tablet Take 80 mg by mouth daily.    [provider]  traZODone (DESYREL) 50 MG tablet Take 75 mg by mouth at bedtime.     [provider]  Vitamin D, Cholecalciferol, 1000 units TABS Take 1,000 Units by mouth at bedtime.    [provider]    Allergies    Cortisone  Review of Systems   Review of Systems  Constitutional: Negative for chills and fever.  HENT: Negative for ear pain and sore throat.   Eyes: Negative for pain and visual disturbance.  Respiratory: Positive for shortness of breath.  Negative for cough.   Cardiovascular: Negative for chest pain and palpitations.  Gastrointestinal: Positive for abdominal pain. Negative for vomiting.  Genitourinary: Negative for dysuria and hematuria.  Musculoskeletal: Negative for arthralgias and back pain.  Skin: Negative for color change and rash.  Neurological: Negative for seizures and syncope.  All other systems reviewed and are negative.   Physical Exam Updated Vital Signs BP 129/88   Pulse 63   Temp 97.9 F (36.6 C) (Oral)   Resp (!) 23   Ht 5' 11"  (1.803 m)   Wt 90.3 kg   SpO2 94%   BMI 27.75 kg/m   Physical Exam Vitals and nursing note reviewed.  Constitutional:      Appearance: He is well-developed.  HENT:     Head: Normocephalic and atraumatic.  Eyes:     Conjunctiva/sclera: Conjunctivae normal.  Cardiovascular:     Rate and Rhythm: Normal rate and regular rhythm.     Heart sounds: No murmur heard.   Pulmonary:     Effort: Pulmonary effort is normal. No respiratory distress.     Breath sounds: Normal breath sounds.  Abdominal:     Palpations: Abdomen is soft.     Tenderness: There is no abdominal tenderness.  Musculoskeletal:        General: No deformity or signs of injury.     Cervical back: Neck supple.  Skin:    General: Skin is warm and dry.  Neurological:     Mental Status: He is alert.     Comments: Oriented to person, place but not time or situation     ED Results / Procedures / Treatments   Labs (all labs ordered are listed, but only abnormal results are displayed) Labs Reviewed  BASIC METABOLIC PANEL - Abnormal; Notable for the following components:      Result Value   Potassium 3.4 (*)    CO2 21 (*)    Glucose, Bld 161 (*)    BUN 50 (*)    Creatinine, Ser 2.75 (*)    GFR calc non Af Amer 19 (*)    GFR calc Af Amer 22 (*)    Anion gap 16 (*)    All other components within normal limits  CBC - Abnormal; Notable for the following components:   WBC 13.8 (*)    RBC 3.62 (*)     Hemoglobin 11.3 (*)    HCT 34.4 (*)  All other components within normal limits  BRAIN NATRIURETIC PEPTIDE - Abnormal; Notable for the following components:   B Natriuretic Peptide 370.6 (*)    All other components within normal limits  CBG MONITORING, ED - Abnormal; Notable for the following components:   Glucose-Capillary 154 (*)    All other components within normal limits  TROPONIN I (HIGH SENSITIVITY) - Abnormal; Notable for the following components:   Troponin I (High Sensitivity) 23 (*)    All other components within normal limits  URINALYSIS, ROUTINE W REFLEX MICROSCOPIC  TROPONIN I (HIGH SENSITIVITY)    EKG EKG Interpretation  Date/Time:  Tuesday July 11 2019 20:08:58 EDT Ventricular Rate:  105 PR Interval:    QRS Duration: 138 QT Interval:  357 QTC Calculation: 472 R Axis:   -6 Text Interpretation: Sinus tachycardia with irregular rate Right bundle branch block Confirmed by Madalyn Rob [phone]) on 07/11/2019 8:16:51 PM   Radiology DG Chest 2 View  Result Date: 07/11/2019 CLINICAL DATA:  Shortness of breath EXAM: CHEST - 2 VIEW COMPARISON:  09/05/2017 FINDINGS: Frontal and lateral views of the chest demonstrate an enlarged cardiac silhouette. There is central vascular congestion with patchy bibasilar consolidation and small bilateral pleural effusions. No acute bony abnormalities. IMPRESSION: 1. Mild congestive heart failure. Electronically Signed   By: Randa Ngo M.D.   On: 07/11/2019 22:12    Procedures Procedures (including critical care time)  Medications Ordered in ED Medications  sodium chloride flush (NS) 0.9 % injection 3 mL (3 mLs Intravenous Not Given 07/11/19 2005)    ED Course  I have reviewed the triage vital signs and the nursing notes.  Pertinent labs & imaging results that were available during my care of the patient were reviewed by me and considered in my medical decision making (see chart for details).    MDM Rules/Calculators/A&P                           86 y/o male presented to ER with concern for generalized weakness. Patient was well appearing with normal vitals in ER, he had no complaints but does have some dementia. EMS had reported mild hypoxia at facility. Initial work up, labs showed elevated Cr though when reviewing notes by cardiology, this does not seem far from his more recent baseline, patient has been referred to outpt nephrology. CXR showed mild vascular congestion though patient clinically looks well - no edema in legs, no tachypnea or hypoxia here. On further discussion with daughter, she reported patient has had some abdominal pain and constipation. CT abd/pelv was ordered to further investigate. UA still pending.  Patient signed out to PA Charlann Lange, [place] MD. Final plan pending CT results, UA.    Final Clinical Impression(s) / ED Diagnoses Final diagnoses:  Thoracic aortic aneurysm, ruptured ([place])  Coagulopathy ([place])  Bladder outlet obstruction    Rx / DC Orders ED Discharge Orders    None       Lucrezia Starch, MD 07/12/19 1337

## 2019-07-12 ENCOUNTER — Encounter (HOSPITAL_COMMUNITY): Payer: Self-pay | Admitting: Family Medicine

## 2019-07-12 ENCOUNTER — Emergency Department (HOSPITAL_COMMUNITY): Payer: Medicare Other

## 2019-07-12 DIAGNOSIS — I5033 Acute on chronic diastolic (congestive) heart failure: Secondary | ICD-10-CM | POA: Diagnosis present

## 2019-07-12 DIAGNOSIS — N179 Acute kidney failure, unspecified: Secondary | ICD-10-CM | POA: Diagnosis present

## 2019-07-12 DIAGNOSIS — J449 Chronic obstructive pulmonary disease, unspecified: Secondary | ICD-10-CM | POA: Diagnosis present

## 2019-07-12 DIAGNOSIS — Z888 Allergy status to other drugs, medicaments and biological substances status: Secondary | ICD-10-CM | POA: Diagnosis not present

## 2019-07-12 DIAGNOSIS — I711 Thoracic aortic aneurysm, ruptured, unspecified: Secondary | ICD-10-CM | POA: Diagnosis present

## 2019-07-12 DIAGNOSIS — E0590 Thyrotoxicosis, unspecified without thyrotoxic crisis or storm: Secondary | ICD-10-CM | POA: Diagnosis present

## 2019-07-12 DIAGNOSIS — I482 Chronic atrial fibrillation, unspecified: Secondary | ICD-10-CM | POA: Diagnosis not present

## 2019-07-12 DIAGNOSIS — Z9861 Coronary angioplasty status: Secondary | ICD-10-CM

## 2019-07-12 DIAGNOSIS — E7800 Pure hypercholesterolemia, unspecified: Secondary | ICD-10-CM | POA: Diagnosis present

## 2019-07-12 DIAGNOSIS — I201 Angina pectoris with documented spasm: Secondary | ICD-10-CM | POA: Diagnosis not present

## 2019-07-12 DIAGNOSIS — N320 Bladder-neck obstruction: Secondary | ICD-10-CM

## 2019-07-12 DIAGNOSIS — N132 Hydronephrosis with renal and ureteral calculous obstruction: Secondary | ICD-10-CM | POA: Diagnosis present

## 2019-07-12 DIAGNOSIS — I130 Hypertensive heart and chronic kidney disease with heart failure and stage 1 through stage 4 chronic kidney disease, or unspecified chronic kidney disease: Secondary | ICD-10-CM | POA: Diagnosis present

## 2019-07-12 DIAGNOSIS — I200 Unstable angina: Secondary | ICD-10-CM | POA: Diagnosis not present

## 2019-07-12 DIAGNOSIS — R4182 Altered mental status, unspecified: Secondary | ICD-10-CM | POA: Diagnosis not present

## 2019-07-12 DIAGNOSIS — I4819 Other persistent atrial fibrillation: Secondary | ICD-10-CM | POA: Diagnosis present

## 2019-07-12 DIAGNOSIS — Z7189 Other specified counseling: Secondary | ICD-10-CM | POA: Diagnosis not present

## 2019-07-12 DIAGNOSIS — I2510 Atherosclerotic heart disease of native coronary artery without angina pectoris: Secondary | ICD-10-CM

## 2019-07-12 DIAGNOSIS — Z7401 Bed confinement status: Secondary | ICD-10-CM | POA: Diagnosis not present

## 2019-07-12 DIAGNOSIS — I714 Abdominal aortic aneurysm, without rupture: Secondary | ICD-10-CM | POA: Diagnosis not present

## 2019-07-12 DIAGNOSIS — F0390 Unspecified dementia without behavioral disturbance: Secondary | ICD-10-CM

## 2019-07-12 DIAGNOSIS — Z20822 Contact with and (suspected) exposure to covid-19: Secondary | ICD-10-CM | POA: Diagnosis present

## 2019-07-12 DIAGNOSIS — Z66 Do not resuscitate: Secondary | ICD-10-CM | POA: Diagnosis not present

## 2019-07-12 DIAGNOSIS — F329 Major depressive disorder, single episode, unspecified: Secondary | ICD-10-CM | POA: Diagnosis present

## 2019-07-12 DIAGNOSIS — E876 Hypokalemia: Secondary | ICD-10-CM | POA: Diagnosis not present

## 2019-07-12 DIAGNOSIS — K219 Gastro-esophageal reflux disease without esophagitis: Secondary | ICD-10-CM | POA: Diagnosis present

## 2019-07-12 DIAGNOSIS — Z515 Encounter for palliative care: Secondary | ICD-10-CM | POA: Diagnosis not present

## 2019-07-12 DIAGNOSIS — R1310 Dysphagia, unspecified: Secondary | ICD-10-CM | POA: Diagnosis present

## 2019-07-12 DIAGNOSIS — F4310 Post-traumatic stress disorder, unspecified: Secondary | ICD-10-CM | POA: Diagnosis present

## 2019-07-12 DIAGNOSIS — I712 Thoracic aortic aneurysm, without rupture: Secondary | ICD-10-CM | POA: Diagnosis not present

## 2019-07-12 DIAGNOSIS — I350 Nonrheumatic aortic (valve) stenosis: Secondary | ICD-10-CM | POA: Diagnosis not present

## 2019-07-12 DIAGNOSIS — N184 Chronic kidney disease, stage 4 (severe): Secondary | ICD-10-CM | POA: Diagnosis present

## 2019-07-12 DIAGNOSIS — M2550 Pain in unspecified joint: Secondary | ICD-10-CM | POA: Diagnosis not present

## 2019-07-12 DIAGNOSIS — D689 Coagulation defect, unspecified: Secondary | ICD-10-CM | POA: Diagnosis present

## 2019-07-12 DIAGNOSIS — I716 Thoracoabdominal aortic aneurysm, without rupture: Secondary | ICD-10-CM | POA: Diagnosis present

## 2019-07-12 LAB — SARS CORONAVIRUS 2 BY RT PCR (HOSPITAL ORDER, PERFORMED IN ~~LOC~~ HOSPITAL LAB): SARS Coronavirus 2: NEGATIVE

## 2019-07-12 LAB — URINALYSIS, ROUTINE W REFLEX MICROSCOPIC
Bilirubin Urine: NEGATIVE
Glucose, UA: NEGATIVE mg/dL
Ketones, ur: NEGATIVE mg/dL
Nitrite: NEGATIVE
Protein, ur: NEGATIVE mg/dL
Specific Gravity, Urine: 1.008 (ref 1.005–1.030)
WBC, UA: >50 WBC/hpf — ABNORMAL HIGH (ref 0–5)
pH: 6 (ref 5.0–8.0)

## 2019-07-12 LAB — BASIC METABOLIC PANEL
BUN/Creatinine Ratio: 17 (ref 10–24)
BUN: 44 mg/dL — ABNORMAL HIGH (ref 10–36)
CO2: 24 mmol/L (ref 20–29)
Calcium: 9.4 mg/dL (ref 8.6–10.2)
Chloride: 102 mmol/L (ref 96–106)
Creatinine, Ser: 2.52 mg/dL — ABNORMAL HIGH (ref 0.76–1.27)
GFR calc Af Amer: 25 mL/min/{1.73_m2} — ABNORMAL LOW (ref >59)
GFR calc non Af Amer: 21 mL/min/{1.73_m2} — ABNORMAL LOW (ref >59)
Glucose: 113 mg/dL — ABNORMAL HIGH (ref 65–99)
Potassium: 4 mmol/L (ref 3.5–5.2)
Sodium: 145 mmol/L — ABNORMAL HIGH (ref 134–144)

## 2019-07-12 LAB — CBC
Hematocrit: 37.3 % — ABNORMAL LOW (ref 37.5–51.0)
Hemoglobin: 12.2 g/dL — ABNORMAL LOW (ref 13.0–17.7)
MCH: 31 pg (ref 26.6–33.0)
MCHC: 32.7 g/dL (ref 31.5–35.7)
MCV: 95 fL (ref 79–97)
Platelets: 308 10*3/uL (ref 150–450)
RBC: 3.93 x10E6/uL — ABNORMAL LOW (ref 4.14–5.80)
RDW: 13 % (ref 11.6–15.4)
WBC: 14.3 10*3/uL — ABNORMAL HIGH (ref 3.4–10.8)

## 2019-07-12 LAB — MAGNESIUM: Magnesium: 2 mg/dL (ref 1.7–2.4)

## 2019-07-12 LAB — BRAIN NATRIURETIC PEPTIDE: BNP: 289.9 pg/mL — ABNORMAL HIGH (ref 0.0–100.0)

## 2019-07-12 MED ORDER — LABETALOL HCL 5 MG/ML IV SOLN
5.0000 mg | Freq: Four times a day (QID) | INTRAVENOUS | Status: DC | PRN
  Administered 2019-07-12: 5 mg via INTRAVENOUS

## 2019-07-12 MED ORDER — SODIUM CHLORIDE 0.9 % IV SOLN: 250.0000 mL | INTRAVENOUS | Status: DC | PRN

## 2019-07-12 MED ORDER — POTASSIUM CHLORIDE CRYS ER 20 MEQ PO TBCR: 30.0000 meq | EXTENDED_RELEASE_TABLET | Freq: Two times a day (BID) | ORAL | Status: DC

## 2019-07-12 MED ORDER — SODIUM CHLORIDE 0.9% FLUSH: 3.0000 mL | INTRAVENOUS | Status: DC | PRN

## 2019-07-12 MED ORDER — SODIUM CHLORIDE 0.9% FLUSH
3.0000 mL | Freq: Two times a day (BID) | INTRAVENOUS | Status: DC
  Administered 2019-07-12 – 2019-07-14 (×3): 3 mL via INTRAVENOUS

## 2019-07-12 MED ORDER — METOPROLOL TARTRATE 100 MG PO TABS
100.0000 mg | ORAL_TABLET | Freq: Two times a day (BID) | ORAL | Status: DC
  Administered 2019-07-12 – 2019-07-14 (×5): 100 mg via ORAL
  Filled 2019-07-12 (×3): qty 1
  Filled 2019-07-12: qty 4
  Filled 2019-07-12: qty 1

## 2019-07-12 MED ORDER — AMLODIPINE BESYLATE 5 MG PO TABS
5.0000 mg | ORAL_TABLET | Freq: Every day | ORAL | Status: DC
  Administered 2019-07-12: 5 mg via ORAL
  Filled 2019-07-12: qty 1

## 2019-07-12 MED ORDER — POTASSIUM CHLORIDE CRYS ER 20 MEQ PO TBCR
20.0000 meq | EXTENDED_RELEASE_TABLET | Freq: Two times a day (BID) | ORAL | Status: AC
  Administered 2019-07-12 (×2): 20 meq via ORAL
  Filled 2019-07-12 (×2): qty 1

## 2019-07-12 MED ORDER — RESOURCE THICKENUP CLEAR PO POWD
ORAL | Status: DC | PRN
  Filled 2019-07-12: qty 125

## 2019-07-12 MED ORDER — ACETAMINOPHEN 325 MG PO TABS: 650.0000 mg | ORAL_TABLET | ORAL | Status: DC | PRN

## 2019-07-12 MED ORDER — TAMSULOSIN HCL 0.4 MG PO CAPS
0.4000 mg | ORAL_CAPSULE | Freq: Every day | ORAL | Status: DC
  Administered 2019-07-12 – 2019-07-13 (×2): 0.4 mg via ORAL
  Filled 2019-07-12 (×2): qty 1

## 2019-07-12 MED ORDER — ONDANSETRON HCL 4 MG/2ML IJ SOLN: 4.0000 mg | Freq: Four times a day (QID) | INTRAMUSCULAR | Status: DC | PRN

## 2019-07-12 MED ORDER — PROTHROMBIN COMPLEX CONC HUMAN 500 UNITS IV KIT
4212.0000 [IU] | PACK | Status: AC
  Administered 2019-07-12: 4212 [IU] via INTRAVENOUS
  Filled 2019-07-12: qty 4212

## 2019-07-12 MED ORDER — LABETALOL HCL 5 MG/ML IV SOLN
5.0000 mg | Freq: Four times a day (QID) | INTRAVENOUS | Status: DC | PRN
  Filled 2019-07-12: qty 4

## 2019-07-12 MED ORDER — FINASTERIDE 5 MG PO TABS
5.0000 mg | ORAL_TABLET | Freq: Every day | ORAL | Status: DC
  Administered 2019-07-12 – 2019-07-14 (×3): 5 mg via ORAL
  Filled 2019-07-12 (×3): qty 1

## 2019-07-12 MED ORDER — UMECLIDINIUM BROMIDE 62.5 MCG/INH IN AEPB
1.0000 | INHALATION_SPRAY | Freq: Every day | RESPIRATORY_TRACT | Status: DC
  Administered 2019-07-12: 1 via RESPIRATORY_TRACT
  Filled 2019-07-12: qty 7

## 2019-07-12 MED ORDER — FUROSEMIDE 10 MG/ML IJ SOLN
40.0000 mg | Freq: Two times a day (BID) | INTRAMUSCULAR | Status: DC
  Administered 2019-07-12 – 2019-07-14 (×5): 40 mg via INTRAVENOUS
  Filled 2019-07-12 (×5): qty 4

## 2019-07-12 MED ORDER — AMLODIPINE BESYLATE 5 MG PO TABS
5.0000 mg | ORAL_TABLET | Freq: Once | ORAL | Status: AC
  Administered 2019-07-12: 5 mg via ORAL
  Filled 2019-07-12: qty 1

## 2019-07-12 MED ORDER — ALBUTEROL SULFATE (2.5 MG/3ML) 0.083% IN NEBU
3.0000 mL | INHALATION_SOLUTION | RESPIRATORY_TRACT | Status: DC | PRN
  Administered 2019-07-12: 3 mL via RESPIRATORY_TRACT
  Filled 2019-07-12: qty 3

## 2019-07-12 MED ORDER — AMLODIPINE BESYLATE 10 MG PO TABS
10.0000 mg | ORAL_TABLET | Freq: Every day | ORAL | Status: DC
  Administered 2019-07-13 – 2019-07-14 (×2): 10 mg via ORAL
  Filled 2019-07-12 (×2): qty 1

## 2019-07-12 MED ORDER — CHLORHEXIDINE GLUCONATE CLOTH 2 % EX PADS
6.0000 | MEDICATED_PAD | Freq: Every day | CUTANEOUS | Status: DC
  Administered 2019-07-12 – 2019-07-14 (×3): 6 via TOPICAL

## 2019-07-12 MED ORDER — SODIUM CHLORIDE 0.9 % IV SOLN
1.0000 g | INTRAVENOUS | Status: DC
  Administered 2019-07-12 – 2019-07-13 (×2): 1 g via INTRAVENOUS
  Filled 2019-07-12: qty 0.05
  Filled 2019-07-12 (×2): qty 10

## 2019-07-12 MED ORDER — HYDROMORPHONE HCL 1 MG/ML IJ SOLN: 0.5000 mg | INTRAMUSCULAR | Status: DC | PRN

## 2019-07-12 MED ORDER — LABETALOL HCL 5 MG/ML IV SOLN
5.0000 mg | INTRAVENOUS | Status: DC | PRN
  Administered 2019-07-12: 5 mg via INTRAVENOUS
  Filled 2019-07-12: qty 4

## 2019-07-12 NOTE — Progress Notes (Signed)
[facility])  [name] [name] is our current palliative care patient in the community.  Please do not hesitate to reach out if we can be of assistance with d/c planning.  Venia Carbon RN, BSN, [facility] Liaison (in [place], under hospice)

## 2019-07-12 NOTE — ED Provider Notes (Signed)
H/o CKD, CHFd, lives in [facility]) Lasix dosing has been being regulated Today, c/o weakness, found to have O2 sats of 80's Per daughter, is at baseline He had c/o abdominal pain (h/o dementia) Cr 2.7 today, baseline 2.4 per [facility]  Plan CT neg - d/ch home  CT abdomen obtained with findings of a 7.2 x 6.5 cm distal thoracic aneurysm with features indicating leakage. Also shows bladder outlet obstruction with hydronephrosis. Also a fecal impaction.   Recheck of the patient performed with Dr. Dina Rich:  Daughter still at bedside. VS, no hypotension or tachycardia. O2 saturations 90-93% RM. The patient appears comfortable and in NAD. He denies CP. He denies abdominal pain (h/o dementia). Per daughter, he did have vomiting today x 1.   CT Findings discussed with daughter. She reports he is full code.   Dr. Dina Rich discussed patient's condition with Dr. Lawson Fiscal (CT surgery) who advises patient is not a surgical candidate. No further CT imaging required to verify or confirm diagnosis. Would recommend reversing Eliquis. This was discussed with pharmacy who will place appropriate order. Patient will be medically managed and admitted to hospitalist service. CT surgery to follow in am.    Dr. Dina Rich to update daughter and patient.    Charlann Lange, PA-C 07/12/19 [id number]    Merryl Hacker, MD 07/12/19 [phone]

## 2019-07-12 NOTE — ED Notes (Signed)
Pt transported to CT ?

## 2019-07-12 NOTE — Progress Notes (Signed)
PROGRESS NOTE    [name] [name]  LKT:[id number] DOB: Nov 25, 1925 DOA: 07/11/2019 PCP: Jodi Marble, MD   Brief Narrative: 86 year old with past medical history significant for chronic diastolic heart failure, hypertension, BPH, CKD presented from his skilled nursing facility for evaluation of shortness of breath and abdominal pain.  History was obtained primarily by daughter who was at bedside.  Patient reportedly had his diuretic decreased recently due to worsening renal function, he then developed short of breath and increasing swelling which prompted the diuretic to be increased again.  Patient did not feel any improvement.  Patient presented with worsening shortness of breath, abdominal pain, difficulty urinating and one episode of vomiting.  Evaluation in the ED patient was found to be afebrile oxygen sats in the upper 80s, mild tachypneic, chest x-ray consistent with CHF, CT abdomen and pelvis with notable for 7.2 cm aortic aneurysm with suspected leak as well as bladder outlet obstruction with bilateral hydroureteronephrosis.  Creatinine 2.7, BUN 50.  CT surgery was consulted by the ED, who recommended Kcentra and hospice care.    Assessment & Plan:   Principal Problem:   Leaking thoracic aortic aneurysm (TAA) (HCC) Active Problems:   Essential hypertension   Bladder outlet obstruction   CAD S/P percutaneous coronary angioplasty   Acute on chronic diastolic congestive heart failure (HCC)   Atrial fibrillation, chronic (HCC)   COPD (chronic obstructive pulmonary disease) (HCC)   CKD (chronic kidney disease), stage IV (HCC)  1-Thoracoabdominal aneurysm with leak ; Patient evaluated by Dr. Darcey Nora, patient is not a candidate for surgery.  Recommendation was for reversal of Eliquis.  Patient received Kcentra. Dr. Darcey Nora recommended comfort care. Blood pressure control.  I have order IV labetalol for systolic blood pressure more than 130.  I have added Norvasc, increase to 10 mg.   Resume metoprolol twice daily Palliative care consulted for goals of care Dilaudid as needed for pain  2-Acute on chronic diastolic heart failure: Continue with IV Lasix 40 mg IV twice daily.  Hold ACE due to increased creatinine. Cardiology consulted  3-AKI Creatinine 2.7 on admission up from 2. Monitor on IV Lasix 4-bladder outlet obstruction: UA with more than 50 white blood cell.  Concern for UTI. Continue with Foley catheter.  Started IV ceftriaxone follow urine culture  5-A. fib: Continue with metoprolol Discontinue  Eliquis 2 thoracoabdominal aneurysmal. Received Kcentra  6-hypertension: Added IV labetalol.  Continue with metoprolol.  Added Norvasc 7-dysphagia: Speech swallow evaluation.  Add thickening to liquids   Kalemia: Replete with oral potassium Estimated body mass index is 27.75 kg/m as calculated from the following:   Height as of this encounter: 5\' 11"  (1.803 m).   Weight as of this encounter: 90.3 kg.   DVT prophylaxis: SCDs Code Status: Discussed with both daughter at bedside, patient is DNR now Family Communication: Daughters at bedside Disposition Plan:  Status is: Inpatient  Remains inpatient appropriate because:IV treatments appropriate due to intensity of illness or inability to take PO   Dispo: The patient is from: SNF              Anticipated d/c is to: To be determined, by benefit from residential hospice facility placement              Anticipated d/c date is: 1 day              Patient currently is not medically stable to d/c.        Consultants:   CVTS  Cardiology  Procedures:   none  Antimicrobials:  Ceftriaxone  Subjective: Patient is alert, he wants to eat.  He has been coughing every time that he drinks or eats.  Objective: Vitals:   07/12/19 0545 07/12/19 0600 07/12/19 0615 07/12/19 0800  BP: 138/84 135/87 (!) 154/79 (!) 169/94  Pulse: 96 89 90 87  Resp: 20 19 19  (!) 23  Temp:      TempSrc:      SpO2: 95% 95% 94%  96%  Weight:      Height:        Intake/Output Summary (Last 24 hours) at 07/12/2019 0837 Last data filed at 07/12/2019 0342 Gross per 24 hour  Intake --  Output 1100 ml  Net -1100 ml   Filed Weights   07/11/19 2000  Weight: 90.3 kg    Examination:  General exam: Appears calm and comfortable  Respiratory system: Clear to auscultation. Respiratory effort normal. Cardiovascular system: S1 & S2 heard, RRR. No JVD, murmurs, rubs, gallops or clicks. No pedal edema. Gastrointestinal system: Abdomen is nondistended, soft and nontender. No organomegaly or masses felt. Normal bowel sounds heard. Central nervous system: Alert Extremities: Trace edema   Data Reviewed: I have personally reviewed following labs and imaging studies  CBC: Recent Labs  Lab 07/11/19 1220 07/11/19 2002  WBC 14.3* 13.8*  HGB 12.2* 11.3*  HCT 37.3* 34.4*  MCV 95 95.0  PLT 308 106   Basic Metabolic Panel: Recent Labs  Lab 07/11/19 1220 07/11/19 2002 07/12/19 0523  NA 145* 139  --   K 4.0 3.4*  --   CL 102 102  --   CO2 24 21*  --   GLUCOSE 113* 161*  --   BUN 44* 50*  --   CREATININE 2.52* 2.75*  --   CALCIUM 9.4 8.9  --   MG  --   --  2.0   GFR: Estimated Creatinine Clearance: 18.3 mL/min (A) (by C-G formula based on SCr of 2.75 mg/dL (H)). Liver Function Tests: No results for input(s): AST, ALT, ALKPHOS, BILITOT, PROT, ALBUMIN in the last 168 hours. No results for input(s): LIPASE, AMYLASE in the last 168 hours. No results for input(s): AMMONIA in the last 168 hours. Coagulation Profile: No results for input(s): INR, PROTIME in the last 168 hours. Cardiac Enzymes: No results for input(s): CKTOTAL, CKMB, CKMBINDEX, TROPONINI in the last 168 hours. BNP (last 3 results) No results for input(s): PROBNP in the last 8760 hours. HbA1C: No results for input(s): HGBA1C in the last 72 hours. CBG: Recent Labs  Lab 07/11/19 2025  GLUCAP 154*   Lipid Profile: No results for input(s): CHOL,  HDL, LDLCALC, TRIG, CHOLHDL, LDLDIRECT in the last 72 hours. Thyroid Function Tests: No results for input(s): TSH, T4TOTAL, FREET4, T3FREE, THYROIDAB in the last 72 hours. Anemia Panel: No results for input(s): VITAMINB12, FOLATE, FERRITIN, TIBC, IRON, RETICCTPCT in the last 72 hours. Sepsis Labs: No results for input(s): PROCALCITON, LATICACIDVEN in the last 168 hours.  Recent Results (from the past 240 hour(s))  SARS Coronavirus 2 by RT PCR (hospital order, performed in [facility] hospital lab) Nasopharyngeal Nasopharyngeal Swab     Status: None   Collection Time: 07/12/19  3:22 AM   Specimen: Nasopharyngeal Swab  Result Value Ref Range Status   SARS Coronavirus 2 NEGATIVE NEGATIVE Final    Comment: (NOTE) SARS-CoV-2 target nucleic acids are NOT DETECTED.  The SARS-CoV-2 RNA is generally detectable in upper and lower respiratory specimens during the acute phase of infection.  The lowest concentration of SARS-CoV-2 viral copies this assay can detect is 250 copies / mL. A negative result does not preclude SARS-CoV-2 infection and should not be used as the sole basis for treatment or other patient management decisions.  A negative result may occur with improper specimen collection / handling, submission of specimen other than nasopharyngeal swab, presence of viral mutation(s) within the areas targeted by this assay, and inadequate number of viral copies (<250 copies / mL). A negative result must be combined with clinical observations, patient history, and epidemiological information.  Fact Sheet for Patients:   StrictlyIdeas.no  Fact Sheet for Healthcare Providers: BankingDealers.co.za  This test is not yet approved or  cleared by the [place] FDA and has been authorized for detection and/or diagnosis of SARS-CoV-2 by FDA under an Emergency Use Authorization (EUA).  This EUA will remain in effect (meaning this test can be used)  for the duration of the COVID-19 declaration under Section 564(b)(1) of the Act, 21 U.S.C. section 360bbb-3(b)(1), unless the authorization is terminated or revoked sooner.  Performed at [facility], [place] [address], [place], [place] [address]          Radiology Studies: CT ABDOMEN PELVIS WO CONTRAST  Result Date: 07/12/2019 CLINICAL DATA:  Bilateral lower quadrant abdominal pain, generalized weakness, emesis EXAM: CT ABDOMEN AND PELVIS WITHOUT CONTRAST TECHNIQUE: Multidetector CT imaging of the abdomen and pelvis was performed following the standard protocol without IV contrast. COMPARISON:  04/05/2013 FINDINGS: Lower chest: Dependent lower lobe consolidation consistent with atelectasis. Trace left pleural effusion. No pneumothorax. Hepatobiliary: Numerous hepatic cysts are unchanged. The gallbladder is grossly normal. Pancreas: Unremarkable. No pancreatic ductal dilatation or surrounding inflammatory changes. Spleen: Normal in size without focal abnormality. Adrenals/Urinary Tract: There is bilateral hydronephrosis and hydroureter, likely due to marked distension of the urinary bladder. Evidence of bladder outlet obstruction with bladder wall thickening and diverticula. There is a nonobstructing 5 mm calculus within the dilated distal right ureter. Multiple right renal cysts are seen. Adrenals are normal. Stomach/Bowel: No bowel obstruction or ileus. Scattered diverticulosis of the distal colon without diverticulitis. No bowel wall thickening or inflammatory change. Vascular/Lymphatic: 7.2 x 6.5 cm aortic aneurysm seen at the level of the diaphragmatic hiatus. There is discontinuity of the mural calcifications, with periaortic fat stranding and high attenuation is seen in the right retrocrural tissues. Appearance is consistent with leaking aneurysm. Evaluation is limited without IV contrast. There is also a 3.9 cm infrarenal abdominal aortic aneurysm. No pathologic adenopathy within the  abdomen or pelvis. Reproductive: Prostate is not enlarged. Other: There is no free intraperitoneal fluid or free gas. No abdominal wall hernia. Musculoskeletal: No acute or destructive bony lesions. Reconstructed images demonstrate no additional findings. IMPRESSION: 1. Leaking 7.2 x 6.5 cm aortic aneurysm at the level of the diaphragmatic hiatus. Surgical consultation recommended. 2. Bladder outlet obstruction, with bilateral hydronephrosis and hydroureter as above. 3. Nonobstructing 5 mm distal right ureteral calculus. 4. 3.9 cm infrarenal abdominal aortic aneurysm without evidence of complication. These results were called by telephone at the time of interpretation on 07/12/2019 at 2:39 am to provider PA Charlann Lange , who verbally acknowledged these results. Electronically Signed   By: Randa Ngo M.D.   On: 07/12/2019 02:40   DG Chest 2 View  Result Date: 07/11/2019 CLINICAL DATA:  Shortness of breath EXAM: CHEST - 2 VIEW COMPARISON:  09/05/2017 FINDINGS: Frontal and lateral views of the chest demonstrate an enlarged cardiac silhouette. There is central vascular congestion  with patchy bibasilar consolidation and small bilateral pleural effusions. No acute bony abnormalities. IMPRESSION: 1. Mild congestive heart failure. Electronically Signed   By: Randa Ngo M.D.   On: 07/11/2019 22:12        Scheduled Meds: . finasteride  5 mg Oral Daily  . furosemide  40 mg Intravenous Q12H  . metoprolol tartrate  100 mg Oral BID  . potassium chloride  20 mEq Oral BID  . sodium chloride flush  3 mL Intravenous Once  . sodium chloride flush  3 mL Intravenous Q12H  . tamsulosin  0.4 mg Oral QPC supper  . umeclidinium bromide  1 puff Inhalation Daily   Continuous Infusions: . sodium chloride       LOS: 0 days    Time spent: 35 minutes.    Elmarie Shiley, MD [facility]   If 7PM-7AM, please contact night-coverage [email or web address]  07/12/2019, 8:37 AM

## 2019-07-12 NOTE — ED Provider Notes (Signed)
Patient originally seen by my colleague, Dr. Roslynn Amble.  Pending CT scan.  Patient with hypoxia and abdominal discomfort.  CT scan concerning for a leaking distal thoracic aortic aneurysm.  Discussed with patient and daughter goals of care.  They would like to explore any options at this time.  He is currently full code.  I did discuss the patient with Dr. Lucianne Lei trigt, cardiothoracic surgery.  Patient not a surgical candidate.  He agrees with reversal of Eliquis and does not recommend further imaging at this time as he is not a surgical candidate.  I relayed this to the patient's daughter.  I also discussed with her that depending on the natural course of this aortic aneurysm, this may be a life ending event for him.  We discussed goals of care which she will further discuss with the admitting team and Dr. Matthew Folks who will evaluate the patient later this morning.  Patient is hemodynamically stable on repeat evaluation.  Given other CT finding of bladder outlet obstruction with resulting hydronephrosis and AKI, will place a Foley catheter.  CRITICAL CARE Performed by: Merryl Hacker   Total critical care time: 40 minutes  Critical care time was exclusive of separately billable procedures and treating other patients.  Critical care was necessary to treat or prevent imminent or life-threatening deterioration.  Critical care was time spent personally by me on the following activities: development of treatment plan with patient and/or surrogate as well as nursing, discussions with consultants, evaluation of patient's response to treatment, examination of patient, obtaining history from patient or surrogate, ordering and performing treatments and interventions, ordering and review of laboratory studies, ordering and review of radiographic studies, pulse oximetry and re-evaluation of patient's condition.   Results for orders placed or performed during the hospital encounter of 62/70/35  Basic metabolic  panel  Result Value Ref Range   Sodium 139 135 - 145 mmol/L   Potassium 3.4 (L) 3.5 - 5.1 mmol/L   Chloride 102 98 - 111 mmol/L   CO2 21 (L) 22 - 32 mmol/L   Glucose, Bld 161 (H) 70 - 99 mg/dL   BUN 50 (H) 8 - 23 mg/dL   Creatinine, Ser 2.75 (H) 0.61 - 1.24 mg/dL   Calcium 8.9 8.9 - 10.3 mg/dL   GFR calc non Af Amer 19 (L) >60 mL/min   GFR calc Af Amer 22 (L) >60 mL/min   Anion gap 16 (H) 5 - 15  CBC  Result Value Ref Range   WBC 13.8 (H) 4.0 - 10.5 K/uL   RBC 3.62 (L) 4.22 - 5.81 MIL/uL   Hemoglobin 11.3 (L) 13.0 - 17.0 g/dL   HCT 34.4 (L) 39 - 52 %   MCV 95.0 80.0 - 100.0 fL   MCH 31.2 26.0 - 34.0 pg   MCHC 32.8 30.0 - 36.0 g/dL   RDW 13.2 11.5 - 15.5 %   Platelets 268 150 - 400 K/uL   nRBC 0.0 0.0 - 0.2 %  Urinalysis, Routine w reflex microscopic  Result Value Ref Range   Color, Urine YELLOW YELLOW   APPearance CLOUDY (A) CLEAR   Specific Gravity, Urine 1.008 1.005 - 1.030   pH 6.0 5.0 - 8.0   Glucose, UA NEGATIVE NEGATIVE mg/dL   Hgb urine dipstick SMALL (A) NEGATIVE   Bilirubin Urine NEGATIVE NEGATIVE   Ketones, ur NEGATIVE NEGATIVE mg/dL   Protein, ur NEGATIVE NEGATIVE mg/dL   Nitrite NEGATIVE NEGATIVE   Leukocytes,Ua LARGE (A) NEGATIVE   RBC / HPF  0-5 0 - 5 RBC/hpf   WBC, UA >50 (H) 0 - 5 WBC/hpf   Bacteria, UA FEW (A) NONE SEEN   WBC Clumps PRESENT   Brain natriuretic peptide  Result Value Ref Range   B Natriuretic Peptide 370.6 (H) 0.0 - 100.0 pg/mL  CBG monitoring, ED  Result Value Ref Range   Glucose-Capillary 154 (H) 70 - 99 mg/dL  Troponin I (High Sensitivity)  Result Value Ref Range   Troponin I (High Sensitivity) 23 (H) <18 ng/L  Troponin I (High Sensitivity)  Result Value Ref Range   Troponin I (High Sensitivity) 22 (H) <18 ng/L   CT ABDOMEN PELVIS WO CONTRAST  Result Date: 07/12/2019 CLINICAL DATA:  Bilateral lower quadrant abdominal pain, generalized weakness, emesis EXAM: CT ABDOMEN AND PELVIS WITHOUT CONTRAST TECHNIQUE: Multidetector CT  imaging of the abdomen and pelvis was performed following the standard protocol without IV contrast. COMPARISON:  04/05/2013 FINDINGS: Lower chest: Dependent lower lobe consolidation consistent with atelectasis. Trace left pleural effusion. No pneumothorax. Hepatobiliary: Numerous hepatic cysts are unchanged. The gallbladder is grossly normal. Pancreas: Unremarkable. No pancreatic ductal dilatation or surrounding inflammatory changes. Spleen: Normal in size without focal abnormality. Adrenals/Urinary Tract: There is bilateral hydronephrosis and hydroureter, likely due to marked distension of the urinary bladder. Evidence of bladder outlet obstruction with bladder wall thickening and diverticula. There is a nonobstructing 5 mm calculus within the dilated distal right ureter. Multiple right renal cysts are seen. Adrenals are normal. Stomach/Bowel: No bowel obstruction or ileus. Scattered diverticulosis of the distal colon without diverticulitis. No bowel wall thickening or inflammatory change. Vascular/Lymphatic: 7.2 x 6.5 cm aortic aneurysm seen at the level of the diaphragmatic hiatus. There is discontinuity of the mural calcifications, with periaortic fat stranding and high attenuation is seen in the right retrocrural tissues. Appearance is consistent with leaking aneurysm. Evaluation is limited without IV contrast. There is also a 3.9 cm infrarenal abdominal aortic aneurysm. No pathologic adenopathy within the abdomen or pelvis. Reproductive: Prostate is not enlarged. Other: There is no free intraperitoneal fluid or free gas. No abdominal wall hernia. Musculoskeletal: No acute or destructive bony lesions. Reconstructed images demonstrate no additional findings. IMPRESSION: 1. Leaking 7.2 x 6.5 cm aortic aneurysm at the level of the diaphragmatic hiatus. Surgical consultation recommended. 2. Bladder outlet obstruction, with bilateral hydronephrosis and hydroureter as above. 3. Nonobstructing 5 mm distal right  ureteral calculus. 4. 3.9 cm infrarenal abdominal aortic aneurysm without evidence of complication. These results were called by telephone at the time of interpretation on 07/12/2019 at 2:39 am to provider PA Charlann Lange , who verbally acknowledged these results. Electronically Signed   By: Randa Ngo M.D.   On: 07/12/2019 02:40   DG Chest 2 View  Result Date: 07/11/2019 CLINICAL DATA:  Shortness of breath EXAM: CHEST - 2 VIEW COMPARISON:  09/05/2017 FINDINGS: Frontal and lateral views of the chest demonstrate an enlarged cardiac silhouette. There is central vascular congestion with patchy bibasilar consolidation and small bilateral pleural effusions. No acute bony abnormalities. IMPRESSION: 1. Mild congestive heart failure. Electronically Signed   By: Randa Ngo M.D.   On: 07/11/2019 22:12     Physical Exam  BP (!) 141/91   Pulse 95   Temp 97.9 F (36.6 C) (Oral)   Resp (!) 23   Ht 1.803 m (5\' 11" )   Wt 90.3 kg   SpO2 93%   BMI 27.75 kg/m   Physical Exam  ED Course/Procedures     Procedures  MDM  Problem List Items Addressed This Visit      Genitourinary   Bladder outlet obstruction    Other Visit Diagnoses    Thoracic aortic aneurysm, ruptured ([place])    -  Primary   Relevant Medications   furosemide (LASIX) injection 40 mg   metoprolol tartrate (LOPRESSOR) tablet 100 mg (Start on 07/12/2019 10:00 AM)   Coagulopathy (HCC)                Merryl Hacker, MD 07/12/19 [id number]

## 2019-07-12 NOTE — Progress Notes (Signed)
Pt received from ED to 4e13. Oriented to room and call bell. CHG bath complete. Tele box connected to pt and CCMD called. VSS. Call bell in reach. Will continue to monitor.  Arletta Bale, RN

## 2019-07-12 NOTE — ED Notes (Signed)
Pt and family updated in regards to delay on CT scan. Per CT tech, earliest scan at 3 am. PA informed. Pt comfortable, repositioned, and pillow provided.

## 2019-07-12 NOTE — ED Notes (Signed)
Report given to 4E RN. All questions answered 

## 2019-07-12 NOTE — ED Notes (Signed)
Pt placed on 2 lpm O2 via [place] for O2 saturation 88-89%

## 2019-07-12 NOTE — Consult Note (Addendum)
Cardiology Consultation:   Patient ID: [name] [name]; [id number]; 1925-02-02   Admit date: 07/11/2019 Date of Consult: 07/12/2019  Primary Care Provider: Jodi Marble, MD Primary Cardiologist: [name], MD 09/23/2018 televisit K. Purcell Nails, Bureau 07/11/2019 Primary Electrophysiologist:  None   Patient Profile:   [name] [name] is a 86 y.o. male with a hx of CAD with known occlusion of proximal LAD and collaterals, DES RCA 2016, D-CHF, PAF RVR, COPD, Cancer, dementia, urinary retention, who is being seen today for preop evaluation of repairing a leaking thoracic aortic aneurysm at the request of Dr Tyrell Antonio.  History of Present Illness:   [name] [name] was seen in the office on 6/29.  He is very sedentary, not active.  He ambulates with a wheelchair at baseline.  The nurse practitioner at the facility had recently decreased the torsemide from 80 mg down to 60 mg daily because his creatinine had increased to 2.4.  However, he did not do well with a decrease and his creatinine only went down to 2.14, so they increase the torsemide back to 80 mg daily.  His Eliquis was decreased to 2.5 mg twice daily.  He did not appear volume overloaded, weight was 199 pounds.  He came to the emergency room last p.m. with generalized weakness, hypoxia with O2 saturation 85% on room air, nausea and vomiting.  Creatinine up to 2.75.  BNP 371.  A CT of his abdomen showed a 7.2 x 6.5 cm distal thoracic aortic aneurysm with features indicating leakage.  He also had hydronephrosis with bladder outlet obstruction and fecal impaction.  He has had a Foley inserted and urine is draining well.  He was seen by Dr. Darcey Nora who advised he is not a surgical candidate.  He recommended reversing the Eliquis and comfort care.  He was given [place].  He has CHF on his chest x-ray, was started on Lasix 40 mg IV every 12 hours, ACE inhibitor was held.  Cardiology was asked to see at family request to confirm that patient is not  a surgical candidate and to help manage CHF and A. Fib.  [name] [name] is resting comfortably on oxygen.  He denies shortness of breath.  His O2 saturation is now 97%.  He is oriented to name and place.    He does not wish to answer questions.  He does not wish to have anything to eat or drink.  He is not having chest pain.  He does not wish to participate in the exam.   Past Medical History:  Diagnosis Date  . Anginal pain ([place])   . Arthritis    "all over"  . Cancer ([place])   . CHF (congestive heart failure) ([place])   . Coronary artery disease   . Dementia ([place])   . Depression ~ 65; ~ 1990   PTSD/Mania   . GERD (gastroesophageal reflux disease)   . High cholesterol   . Hypertension   . Hyperthyroidism   . Neuropathy   . Osteoporosis     Past Surgical History:  Procedure Laterality Date  . BACK SURGERY    . CARDIAC CATHETERIZATION  09/2007  . CARDIAC CATHETERIZATION N/A 05/08/2014   Procedure: CORONARY STENT INTERVENTION;  Surgeon: [name], MD; 3.518 mm Xience Alpine DES to the RCA   . LEFT HEART CATHETERIZATION WITH CORONARY ANGIOGRAM N/A 05/08/2014   Procedure: LEFT HEART CATHETERIZATION WITH CORONARY ANGIOGRAM;  Surgeon: [name], MD; LAD 100% (old), CFX 20%, OM1 30%, pRCA 30%, dRCA 95%, EF 55%     .  LUMBAR LAMINECTOMY/DECOMPRESSION MICRODISCECTOMY  2011     Prior to Admission medications   Medication Sig Start Date End Date Taking? Authorizing Provider  acetaminophen (TYLENOL) 500 MG tablet Take 500-1,000 mg by mouth every 6 (six) hours as needed for mild pain.    Yes [provider]  albuterol (VENTOLIN HFA) 108 (90 Base) MCG/ACT inhaler Inhale 2 puffs into the lungs daily as needed for wheezing or shortness of breath.  03/15/19  Yes [provider]  amLODipine (NORVASC) 10 MG tablet Take 10 mg by mouth daily. 07/04/19  Yes [provider]  apixaban (ELIQUIS) 2.5 MG TABS tablet Take 1 tablet (2.5 mg total) by mouth 2 (two) times daily.  07/11/19  Yes Lendon Colonel, NP  Carboxymethylcellul-Glycerin (REFRESH OPTIVE) 1-0.9 % GEL Apply 1 drop to eye 4 (four) times daily.   Yes [provider]  cyanocobalamin 1000 MCG tablet Take 1,000 mcg by mouth daily.   Yes [provider]  Docusate Sodium 100 MG capsule Take 100 mg by mouth daily.   Yes [provider]  Ferrous Sulfate (IRON) 325 (65 Fe) MG TABS Take 325 mg by mouth daily.   Yes [provider]  finasteride (PROSCAR) 5 MG tablet Take 5 mg by mouth daily.   Yes [provider]  ipratropium-albuterol (DUONEB) 0.5-2.5 (3) MG/3ML SOLN Take 3 mLs by nebulization 3 (three) times daily as needed (wheezing, shortness of breath).   Yes [provider]  isosorbide mononitrate (IMDUR) 30 MG 24 hr tablet Take 30 mg by mouth daily.   Yes [provider]  loratadine (CLARITIN) 10 MG tablet Take 1 tablet (10 mg total) by mouth daily. 02/28/17  Yes Emokpae, Courage, MD  metoprolol (LOPRESSOR) 100 MG tablet Take 100 mg by mouth 2 (two) times daily.   Yes [provider]  Multiple Vitamins-Minerals (PRESERVISION AREDS 2+MULTI VIT) CAPS Take 1 capsule by mouth 2 (two) times daily.    Yes [provider]  nystatin (NYSTATIN) powder Apply 1 application topically as needed (groin andn buttocks).   Yes [provider]  omeprazole (PRILOSEC) 20 MG capsule Take 20 mg by mouth at bedtime.  06/27/19  Yes [provider]  polyethylene glycol (MIRALAX / GLYCOLAX) 17 g packet Take 17 g by mouth daily as needed for mild constipation.   Yes [provider]  sertraline (ZOLOFT) 25 MG tablet Take 25 mg by mouth daily.   Yes [provider]  Skin Protectants, Misc. (EUCERIN) cream Apply 1 application topically 2 (two) times daily. Apply to back, upper back, and behind left ear, and upper right chest.   Yes [provider]  tamsulosin (FLOMAX) 0.4 MG CAPS capsule Take 1 capsule (0.4 mg  total) by mouth daily after supper. 12/02/15  Yes Amairany Schumpert, Evelene Croon, PA-C  tiotropium (SPIRIVA HANDIHALER) 18 MCG inhalation capsule Place 1 capsule (18 mcg total) into inhaler and inhale daily. 02/27/17 07/12/19 Yes Emokpae, Courage, MD  torsemide (DEMADEX) 20 MG tablet Take 80 mg by mouth daily.   Yes [provider]  traZODone (DESYREL) 50 MG tablet Take 50 mg by mouth at bedtime.    Yes [provider]  levalbuterol (XOPENEX) 1.25 MG/0.5ML nebulizer solution Take 1.25 mg by nebulization 3 (three) times daily. Patient not taking: Reported on 07/12/2019 07/16/17   Raiford Noble Latif, DO  lisinopril (PRINIVIL,ZESTRIL) 20 MG tablet Take 1 tablet (20 mg total) by mouth daily. Patient not taking: Reported on 07/12/2019 07/17/17   Kerney Elbe, DO  Inpatient Medications: Scheduled Meds: . amLODipine  5 mg Oral Daily  . finasteride  5 mg Oral Daily  . furosemide  40 mg Intravenous Q12H  . metoprolol tartrate  100 mg Oral BID  . potassium chloride  20 mEq Oral BID  . sodium chloride flush  3 mL Intravenous Once  . sodium chloride flush  3 mL Intravenous Q12H  . tamsulosin  0.4 mg Oral QPC supper  . umeclidinium bromide  1 puff Inhalation Daily   Continuous Infusions: . sodium chloride     PRN Meds: sodium chloride, acetaminophen, albuterol, HYDROmorphone, labetalol, ondansetron (ZOFRAN) IV, Resource ThickenUp Clear, sodium chloride flush  Allergies:    Allergies  Allergen Reactions  . Cortisone Other (See Comments)    Causes emotional problems     Social History:   Social History   Socioeconomic History  . Marital status: Divorced    Spouse name: Not on file  . Number of children: 3  . Years of education: unknown  . Highest education level: Not on file  Occupational History  . Not on file  Tobacco Use  . Smoking status: Former Smoker    Packs/day: 1.00    Years: 20.00    Pack years: 20.00    Types: Cigarettes    Quit date: 01/12/1962    Years since  quitting: 57.5  . Smokeless tobacco: Never Used  Substance and Sexual Activity  . Alcohol use: No    Alcohol/week: 0.0 standard drinks    Comment: 05/08/2014 "he's been in [place] for ~!35 yrs"  . Drug use: No  . Sexual activity: Not on file  Other Topics Concern  . Not on file  Social History Narrative   He was in [name] youth camps as a child. He emigrated to the [place] after World War II. He was staying with his host family in [place] when he got drafted for [place].      09/23/17 He now lives in [place], [place] and his daughter helps in his care.   Social Determinants of Health   Financial Resource Strain:   . Difficulty of Paying Living Expenses:   Food Insecurity:   . Worried About Charity fundraiser in the Last Year:   . Arboriculturist in the Last Year:   Transportation Needs:   . Film/video editor (Medical):   [name] Lack of Transportation (Non-Medical):   Physical Activity:   . Days of Exercise per Week:   . Minutes of Exercise per Session:   Stress:   . Feeling of Stress :   Social Connections:   . Frequency of Communication with Friends and Family:   . Frequency of Social Gatherings with Friends and Family:   . Attends Religious Services:   . Active Member of Clubs or Organizations:   . Attends Archivist Meetings:   [name] Marital Status:   Intimate Partner Violence:   . Fear of Current or Ex-Partner:   . Emotionally Abused:   [name] Physically Abused:   . Sexually Abused:     Family History:   Family History  Problem Relation Age of Onset  . CAD Mother   . Asthma Mother    Family Status:  Family Status  Relation Name Status  . Mother  Deceased  . Father  Deceased  . MGM  Deceased  . MGF  Deceased  . PGM  Deceased  . PGF  Deceased    ROS:  Please see the history of  present illness.  All other ROS reviewed and negative.     Physical Exam/Data:   Vitals:   07/12/19 0600 07/12/19 0615 07/12/19 0800 07/12/19 0913  BP: 135/87  (!) 154/79 (!) 169/94 (!) 155/90  Pulse: 89 90 87 (!) 107  Resp: 19 19 (!) 23   Temp:      TempSrc:      SpO2: 95% 94% 96%   Weight:      Height:        Intake/Output Summary (Last 24 hours) at 07/12/2019 0917 Last data filed at 07/12/2019 0342 Gross per 24 hour  Intake --  Output 1100 ml  Net -1100 ml    Last 3 Weights 07/11/2019 07/11/2019 09/23/2018  Weight (lbs) 199 lb 199 lb 196 lb 6.4 oz  Weight (kg) 90.266 kg 90.266 kg 89.086 kg     Body mass index is 27.75 kg/m.   General:  Well nourished, well developed, male in no acute distress HEENT: normal Lymph: no adenopathy Neck: JVD -approximately 9 cm Endocrine:  No thryomegaly Vascular: No carotid bruits; 4/4 extremity pulses 2+  Cardiac:  normal S1, S2; RRR; no murmur Lungs: Rales bases bilaterally, no wheezing, rhonchi  Abd: soft, nontender, no hepatomegaly  Ext: no edema Musculoskeletal:  No deformities, BUE and BLE strength weak but equal Skin: warm and dry  Neuro:  CNs 2-12 intact, no focal abnormalities noted  EKG:  The EKG was personally reviewed and demonstrates: Sinus tachycardia, heart rate 105, right bundle is old, PACs, sometimes bigeminy Telemetry:  Telemetry was personally reviewed and demonstrates: Sinus rhythm, frequent PACs   CV studies:   ECHO: Ordered  ECHO: 08/2017 - Left ventricle: Systolic function was normal. The estimated ejection fraction was in the range of 60% to 65%. Wall motion was normal; there were no regional wall motion abnormalities. Doppler parameters are consistent with indeterminate mean left atrial filling pressure. - Aortic valve: There was trivial regurgitation. - Mitral valve: Valve area by pressure half-time: 2.1 cm^2. - Left atrium: The atrium was mildly dilated. - Pulmonary arteries: PA peak pressure: 33 mm Hg (S). - Pericardium, extracardiac: A trivial pericardial effusion was identified.  CATH: 05/08/2014 ANGIOGRAPHY:   The left main coronary artery  was angiographically normal and bifurcated into the LAD and left circumflex coronary artery.   The LAD  Was occluded proximally after the takeoff of the very proximal septal perforating artery.  There was collateralization to the diagonal vessel in the mid distal LAD system both from right to left and mild left to left collaterals.  The left circumflex coronary artery was  A moderate size vessel that gave rise to one major bifurcating obtuse marginal branch.  There was mild 20% proximal circumflex stenosis and 30% mid OM stenosis.  The RCA was a large dominant vessel that gave rise to a large PDA and PLA vessel.  There was a focal 95% stenosis after the acute margin and proximal to the takeoff of a large PDA system.  There were collaterals supplying the LAD which were jeopardized by this stenosis.  Left ventriculography revealed normal global LV contractility with mild distal anterolateral hypocontractility.  Ejection fraction is at least 55%. There was no evidence for mitral regurgitation.    Following PCI to the large dominant RCA the 95% stenosis was reduced to 0% with ultimate insertion of a science Alpine 3.518 mm DES stent postdilated to 3.7 mm. There was brisk TIMI-3 flow.  There was no for dissection.  There were significantly improved collaterals  supplying the RCA following the PCI procedure.   Total contrast used: 160 cc  Omnipaque  IMPRESSION:  Preserved global LV contractility with mild distal anterolateral hypocontractility an ejection fraction of at least 55%.  Multivessel CAD with old total occlusion of the proximal LAD with left to left and right to left collaterals, mild 20% proximal circumflex and 30% mid OM stenosis; and 30% proximal RCA stenosis with a 95% distal RCA stenosis proximal to the PDA takeoff with jeopardized collaterals to the LAD.  Successful PCI with PTCA/DES stenting with insertion of a 3.518 mm Xience Alpine DES stent postdilated 3.7 mm the 95%  RCA stenosis being reduced to 0%.   RECOMMENDATION:  The patient will continue with dual antiplatelet therapy for minimum of one-year.  Medical therapy will be continued for his concomitant CAD with previously documented old total LAD occlusion with collateralization.  The collaterals are now improved following PCI to the RCA.   Laboratory Data:   Chemistry Recent Labs  Lab 07/11/19 1220 07/11/19 2002  NA 145* 139  K 4.0 3.4*  CL 102 102  CO2 24 21*  GLUCOSE 113* 161*  BUN 44* 50*  CREATININE 2.52* 2.75*  CALCIUM 9.4 8.9  GFRNONAA 21* 19*  GFRAA 25* 22*  ANIONGAP  --  16*    Lab Results  Component Value Date   ALT 31 07/29/2017   AST 31 07/29/2017   ALKPHOS 116 07/29/2017   BILITOT 0.9 07/29/2017   Hematology Recent Labs  Lab 07/11/19 1220 07/11/19 2002  WBC 14.3* 13.8*  RBC 3.93* 3.62*  HGB 12.2* 11.3*  HCT 37.3* 34.4*  MCV 95 95.0  MCH 31.0 31.2  MCHC 32.7 32.8  RDW 13.0 13.2  PLT 308 268   Cardiac Enzymes High Sensitivity Troponin:   Recent Labs  Lab 07/11/19 2028 07/11/19 2228  TROPONINIHS 23* 22*      BNP Recent Labs  Lab 07/11/19 1220 07/11/19 2028  BNP WILL FOLLOW 370.6*    DDimer No results for input(s): DDIMER in the last 168 hours. TSH:  Lab Results  Component Value Date   TSH 0.626 09/02/2017   Lipids: Lab Results  Component Value Date   CHOL 111 05/10/2014   HDL 30 (L) 05/10/2014   LDLCALC 69 05/10/2014   TRIG 60 05/10/2014   CHOLHDL 3.7 05/10/2014   HgbA1c:No results found for: HGBA1C Magnesium:  Magnesium  Date Value Ref Range Status  07/12/2019 2.0 1.7 - 2.4 mg/dL Final    Comment:    Performed at [facility], [place] [address], [place],  [address]     Radiology/Studies:  CT ABDOMEN PELVIS WO CONTRAST  Result Date: 07/12/2019 CLINICAL DATA:  Bilateral lower quadrant abdominal pain, generalized weakness, emesis EXAM: CT ABDOMEN AND PELVIS WITHOUT CONTRAST TECHNIQUE: Multidetector CT imaging of the  abdomen and pelvis was performed following the standard protocol without IV contrast. COMPARISON:  04/05/2013 FINDINGS: Lower chest: Dependent lower lobe consolidation consistent with atelectasis. Trace left pleural effusion. No pneumothorax. Hepatobiliary: Numerous hepatic cysts are unchanged. The gallbladder is grossly normal. Pancreas: Unremarkable. No pancreatic ductal dilatation or surrounding inflammatory changes. Spleen: Normal in size without focal abnormality. Adrenals/Urinary Tract: There is bilateral hydronephrosis and hydroureter, likely due to marked distension of the urinary bladder. Evidence of bladder outlet obstruction with bladder wall thickening and diverticula. There is a nonobstructing 5 mm calculus within the dilated distal right ureter. Multiple right renal cysts are seen. Adrenals are normal. Stomach/Bowel: No bowel obstruction or ileus. Scattered diverticulosis  of the distal colon without diverticulitis. No bowel wall thickening or inflammatory change. Vascular/Lymphatic: 7.2 x 6.5 cm aortic aneurysm seen at the level of the diaphragmatic hiatus. There is discontinuity of the mural calcifications, with periaortic fat stranding and high attenuation is seen in the right retrocrural tissues. Appearance is consistent with leaking aneurysm. Evaluation is limited without IV contrast. There is also a 3.9 cm infrarenal abdominal aortic aneurysm. No pathologic adenopathy within the abdomen or pelvis. Reproductive: Prostate is not enlarged. Other: There is no free intraperitoneal fluid or free gas. No abdominal wall hernia. Musculoskeletal: No acute or destructive bony lesions. Reconstructed images demonstrate no additional findings. IMPRESSION: 1. Leaking 7.2 x 6.5 cm aortic aneurysm at the level of the diaphragmatic hiatus. Surgical consultation recommended. 2. Bladder outlet obstruction, with bilateral hydronephrosis and hydroureter as above. 3. Nonobstructing 5 mm distal right ureteral calculus.  4. 3.9 cm infrarenal abdominal aortic aneurysm without evidence of complication. These results were called by telephone at the time of interpretation on 07/12/2019 at 2:39 am to provider PA Charlann Lange , who verbally acknowledged these results. Electronically Signed   By: Randa Ngo M.D.   On: 07/12/2019 02:40   DG Chest 2 View  Result Date: 07/11/2019 CLINICAL DATA:  Shortness of breath EXAM: CHEST - 2 VIEW COMPARISON:  09/05/2017 FINDINGS: Frontal and lateral views of the chest demonstrate an enlarged cardiac silhouette. There is central vascular congestion with patchy bibasilar consolidation and small bilateral pleural effusions. No acute bony abnormalities. IMPRESSION: 1. Mild congestive heart failure. Electronically Signed   By: Randa Ngo M.D.   On: 07/11/2019 22:12    Assessment and Plan:   1.  Preoperative cardiovascular exam -Mr. Umscheid is very frail, poor activity status. -His RCRI score is four giving him an 11% perioperative risk of major cardiac event. -His Duke Activity Status Index score is zero, this gives him a functional capacity in METS of 2.74. -He has a significantly increased mortality risk because of his poor functional status. -Agree with Dr. Darcey Nora that surgery is not an option for him. -Agree with comfort care  2.  Acute on chronic CHF: -He has good urine output on the IV Lasix, continue this -Follow daily renal function, daily weights and I/O  3. 7.2 x 6.5 cm leaking thoracic aortic aneurysm -Not a surgical candidate, Palliative Care has been called  4. Bladder outlet obstruction - improved after foley placed - good UOP now  5. CKD III-IV -Cr may improve w/ better UOP - follow   6. Chronic anticoagulation - pta on Eliquis 2.5 mg bid - because of leaking aneurysm, Eliquis discontinued and Kcentra given  Otherwise, per IM Principal Problem:   Leaking thoracic aortic aneurysm (TAA) ([place]) Active Problems:   Essential hypertension   Bladder  outlet obstruction   CAD S/P percutaneous coronary angioplasty   Acute on chronic diastolic congestive heart failure (HCC)   Atrial fibrillation, chronic (HCC)   COPD (chronic obstructive pulmonary disease) (HCC)   CKD (chronic kidney disease), stage IV ([place])   For questions or updates, please contact [facility] Please consult [email or web address] for contact info under Cardiology/STEMI.   SignedRosaria Ferries, PA-C  07/12/2019 9:17 AM

## 2019-07-12 NOTE — H&P (Signed)
History and Physical    Phares Zaccone RXY:[id number] DOB: 10/25/1925 DOA: 07/11/2019  PCP: Jodi Marble, MD   Patient coming from: SNF   Chief Complaint: Abdominal pain, SOB   HPI: [name] [name] is a 86 y.o. male with medical history significant for chronic diastolic CHF, hypertension, BPH, chronic kidney disease, now presenting from his SNF for evaluation of shortness of breath and abdominal pain.  Patient is accompanied by his daughter who assists with the history.  Patient reportedly had his diuretics decreased recently due to worsening renal function, reportedly became short of breath and had increased swelling which prompted the diuretic to be increased again but the patient has not had any improvement in his dyspnea and had a chest x-ray at his facility that was concerning for CHF per report of his daughter.  Patient also began to complain of some abdominal pain, had difficulty urinating, and had one episode of vomiting.  ED Course: Upon arrival to the ED, patient is found to be afebrile, saturating upper 80s on room air, mildly tachypneic, and with stable blood pressure.  EKG features undetermined rhythm with RBBB.  Chest x-ray consistent with CHF.  CT the abdomen and pelvis is notable for 7.2 cm aortic aneurysm with suspected leak, as well as bladder outlet obstruction with bilateral hydroureteronephrosis.  Chemistry panel features a potassium of 3.4, BUN 50, and creatinine 2.75.  CBC notable for leukocytosis to 13,800 and mild normocytic anemia.  Troponin slightly elevated.  BNP elevated to 371.  CT surgery was consulted by the ED physician.  Eppie Gibson was administered.  COVID-19 screening test not yet resulted.  Review of Systems:  ROS limited by the patient's clinical condition.  Past Medical History:  Diagnosis Date  . Anginal pain ([place])   . Arthritis    "all over"  . Cancer ([place])   . CHF (congestive heart failure) ([place])   . Coronary artery disease   . Dementia ([place])   .  Depression ~ 65; ~ 1990   PTSD/Mania   . GERD (gastroesophageal reflux disease)   . High cholesterol   . Hypertension   . Hyperthyroidism   . Neuropathy   . Osteoporosis     Past Surgical History:  Procedure Laterality Date  . BACK SURGERY    . CARDIAC CATHETERIZATION  09/2007  . CARDIAC CATHETERIZATION N/A 05/08/2014   Procedure: CORONARY STENT INTERVENTION;  Surgeon: [name], MD; 3.518 mm Xience Alpine DES to the RCA   . LEFT HEART CATHETERIZATION WITH CORONARY ANGIOGRAM N/A 05/08/2014   Procedure: LEFT HEART CATHETERIZATION WITH CORONARY ANGIOGRAM;  Surgeon: [name], MD; LAD 100% (old), CFX 20%, OM1 30%, pRCA 30%, dRCA 95%, EF 55%     . LUMBAR LAMINECTOMY/DECOMPRESSION MICRODISCECTOMY  2011     reports that he quit smoking about 57 years ago. His smoking use included cigarettes. He has a 20.00 pack-year smoking history. He has never used smokeless tobacco. He reports that he does not drink alcohol and does not use drugs.  Allergies  Allergen Reactions  . Cortisone Other (See Comments)    Causes emotional problems     Family History  Problem Relation Age of Onset  . CAD Mother   . Asthma Mother      Prior to Admission medications   Medication Sig Start Date End Date Taking? Authorizing Provider  acetaminophen (TYLENOL) 500 MG tablet Take 500-1,000 mg by mouth every 6 (six) hours as needed for mild pain.     [provider]  acyclovir (ZOVIRAX) 800 MG tablet Take 800 mg by mouth 4 (four) times daily.    [provider]  albuterol (VENTOLIN HFA) 108 (90 Base) MCG/ACT inhaler Inhale 1 puff into the lungs daily as needed. 03/15/19   [provider]  amLODipine (NORVASC) 10 MG tablet Take 10 mg by mouth daily. 07/04/19   [provider]  apixaban (ELIQUIS) 2.5 MG TABS tablet Take 1 tablet (2.5 mg total) by mouth 2 (two) times daily. 07/11/19   Lendon Colonel, NP  Carboxymethylcellulose Sod PF 1 % GEL Place 1 drop into both  eyes 2 (two) times daily.     [provider]  Cyanocobalamin (B-12 COMPLIANCE INJECTION) 1000 MCG/ML KIT Inject 1,000 mcg as directed See admin instructions. On the 20th of every month    [provider]  finasteride (PROSCAR) 5 MG tablet Take 5 mg by mouth daily.    [provider]  isosorbide mononitrate (IMDUR) 30 MG 24 hr tablet Take 30 mg by mouth daily.    [provider]  levalbuterol (XOPENEX) 1.25 MG/0.5ML nebulizer solution Take 1.25 mg by nebulization 3 (three) times daily. Patient taking differently: Take 1.25 mg by nebulization every 4 (four) hours.  07/16/17   Raiford Noble Latif, DO  lisinopril (PRINIVIL,ZESTRIL) 20 MG tablet Take 1 tablet (20 mg total) by mouth daily. 07/17/17   Raiford Noble Latif, DO  loratadine (CLARITIN) 10 MG tablet Take 1 tablet (10 mg total) by mouth daily. 02/28/17   Roxan Hockey, MD  meclizine (ANTIVERT) 12.5 MG tablet Take 12.5 mg by mouth every 4 (four) hours as needed for dizziness.    [provider]  metoprolol (LOPRESSOR) 100 MG tablet Take 100 mg by mouth 2 (two) times daily.    [provider]  Multiple Vitamins-Minerals (PRESERVISION AREDS 2+MULTI VIT) CAPS Take 1 capsule by mouth 2 (two) times daily.     [provider]  omeprazole (PRILOSEC) 20 MG capsule Take 20 mg by mouth daily. 06/27/19   [provider]  pantoprazole (PROTONIX) 40 MG tablet Take 40 mg by mouth daily.    [provider]  sertraline (ZOLOFT) 25 MG tablet Take 25 mg by mouth daily.    [provider]  tamsulosin (FLOMAX) 0.4 MG CAPS capsule Take 1 capsule (0.4 mg total) by mouth daily after supper. 12/02/15   Barrett, Evelene Croon, PA-C  tiotropium (SPIRIVA HANDIHALER) 18 MCG inhalation capsule Place 1 capsule (18 mcg total) into inhaler and inhale daily. 02/27/17 07/11/19  Roxan Hockey, MD  torsemide (DEMADEX) 20 MG tablet Take 80 mg by mouth daily.    [provider]  traZODone  (DESYREL) 50 MG tablet Take 75 mg by mouth at bedtime.     [provider]  Vitamin D, Cholecalciferol, 1000 units TABS Take 1,000 Units by mouth at bedtime.    [provider]    Physical Exam: Vitals:   07/12/19 0315 07/12/19 0330 07/12/19 0345 07/12/19 0400  BP: (!) 146/84 (!) 143/101 (!) 147/83 (!) 141/91  Pulse: (!) 154 90 94 95  Resp: (!) 21 (!) 24 (!) 22 (!) 23  Temp:      TempSrc:      SpO2: 90% 95% 95% 93%  Weight:      Height:        Constitutional: NAD, calm  Eyes: PERTLA, lids and conjunctivae normal ENMT: Mucous membranes are moist. Posterior pharynx clear of any exudate or lesions.   Neck: normal, supple, no masses, no thyromegaly  Respiratory: Mild tachypnea, rales bilaterally. No pallor or cyanosis.  Cardiovascular: S1 & S2 heard, regular rate and rhythm. Mild pitting edema involving b/l LEs.  Abdomen: Soft, no tenderness, distended bladder. Bowel sounds active.  Musculoskeletal: no clubbing / cyanosis. No joint deformity upper and lower extremities.   Skin: no significant rashes, lesions, ulcers. Warm, dry, well-perfused. Neurologic: No gross facial asymmetry. Gross hearing deficit. Sensation intact. Moving all extremities.  Psychiatric: Alert and oriented to person and place only. Pleasant and cooperative.    Labs and Imaging on Admission: I have personally reviewed following labs and imaging studies  CBC: Recent Labs  Lab 07/11/19 2002  WBC 13.8*  HGB 11.3*  HCT 34.4*  MCV 95.0  PLT 268   Basic Metabolic Panel: Recent Labs  Lab 07/11/19 2002  NA 139  K 3.4*  CL 102  CO2 21*  GLUCOSE 161*  BUN 50*  CREATININE 2.75*  CALCIUM 8.9   GFR: Estimated Creatinine Clearance: 18.3 mL/min (A) (by C-G formula based on SCr of 2.75 mg/dL (H)). Liver Function Tests: No results for input(s): AST, ALT, ALKPHOS, BILITOT, PROT, ALBUMIN in the last 168 hours. No results for input(s): LIPASE, AMYLASE in the last 168 hours. No results for  input(s): AMMONIA in the last 168 hours. Coagulation Profile: No results for input(s): INR, PROTIME in the last 168 hours. Cardiac Enzymes: No results for input(s): CKTOTAL, CKMB, CKMBINDEX, TROPONINI in the last 168 hours. BNP (last 3 results) No results for input(s): PROBNP in the last 8760 hours. HbA1C: No results for input(s): HGBA1C in the last 72 hours. CBG: Recent Labs  Lab 07/11/19 2025  GLUCAP 154*   Lipid Profile: No results for input(s): CHOL, HDL, LDLCALC, TRIG, CHOLHDL, LDLDIRECT in the last 72 hours. Thyroid Function Tests: No results for input(s): TSH, T4TOTAL, FREET4, T3FREE, THYROIDAB in the last 72 hours. Anemia Panel: No results for input(s): VITAMINB12, FOLATE, FERRITIN, TIBC, IRON, RETICCTPCT in the last 72 hours. Urine analysis:    Component Value Date/Time   COLORURINE YELLOW 07/12/2019 0047   APPEARANCEUR CLOUDY (A) 07/12/2019 0047   LABSPEC 1.008 07/12/2019 0047   PHURINE 6.0 07/12/2019 0047   GLUCOSEU NEGATIVE 07/12/2019 0047   HGBUR SMALL (A) 07/12/2019 0047   BILIRUBINUR NEGATIVE 07/12/2019 0047   KETONESUR NEGATIVE 07/12/2019 0047   PROTEINUR NEGATIVE 07/12/2019 0047   UROBILINOGEN 0.2 11/09/2014 1532   NITRITE NEGATIVE 07/12/2019 0047   LEUKOCYTESUR LARGE (A) 07/12/2019 0047   Sepsis Labs: _0 (procalcitonin:4,lacticidven:4) )No results found for this or any previous visit (from the past 240 hour(s)).   Radiological Exams on Admission: CT ABDOMEN PELVIS WO CONTRAST  Result Date: 07/12/2019 CLINICAL DATA:  Bilateral lower quadrant abdominal pain, generalized weakness, emesis EXAM: CT ABDOMEN AND PELVIS WITHOUT CONTRAST TECHNIQUE: Multidetector CT imaging of the abdomen and pelvis was performed following the standard protocol without IV contrast. COMPARISON:  04/05/2013 FINDINGS: Lower chest: Dependent lower lobe consolidation consistent with atelectasis. Trace left pleural effusion. No pneumothorax. Hepatobiliary: Numerous hepatic cysts  are unchanged. The gallbladder is grossly normal. Pancreas: Unremarkable. No pancreatic ductal dilatation or surrounding inflammatory changes. Spleen: Normal in size without focal abnormality. Adrenals/Urinary Tract: There is bilateral hydronephrosis and hydroureter, likely due to marked distension of the urinary bladder. Evidence of bladder outlet obstruction with bladder wall thickening and diverticula. There is a nonobstructing 5 mm calculus within the dilated distal right ureter. Multiple right renal cysts are seen. Adrenals are normal. Stomach/Bowel: No bowel obstruction or ileus. Scattered diverticulosis of the distal colon without diverticulitis.  No bowel wall thickening or inflammatory change. Vascular/Lymphatic: 7.2 x 6.5 cm aortic aneurysm seen at the level of the diaphragmatic hiatus. There is discontinuity of the mural calcifications, with periaortic fat stranding and high attenuation is seen in the right retrocrural tissues. Appearance is consistent with leaking aneurysm. Evaluation is limited without IV contrast. There is also a 3.9 cm infrarenal abdominal aortic aneurysm. No pathologic adenopathy within the abdomen or pelvis. Reproductive: Prostate is not enlarged. Other: There is no free intraperitoneal fluid or free gas. No abdominal wall hernia. Musculoskeletal: No acute or destructive bony lesions. Reconstructed images demonstrate no additional findings. IMPRESSION: 1. Leaking 7.2 x 6.5 cm aortic aneurysm at the level of the diaphragmatic hiatus. Surgical consultation recommended. 2. Bladder outlet obstruction, with bilateral hydronephrosis and hydroureter as above. 3. Nonobstructing 5 mm distal right ureteral calculus. 4. 3.9 cm infrarenal abdominal aortic aneurysm without evidence of complication. These results were called by telephone at the time of interpretation on 07/12/2019 at 2:39 am to provider PA Charlann Lange , who verbally acknowledged these results. Electronically Signed   By: Randa Ngo M.D.   On: 07/12/2019 02:40   DG Chest 2 View  Result Date: 07/11/2019 CLINICAL DATA:  Shortness of breath EXAM: CHEST - 2 VIEW COMPARISON:  09/05/2017 FINDINGS: Frontal and lateral views of the chest demonstrate an enlarged cardiac silhouette. There is central vascular congestion with patchy bibasilar consolidation and small bilateral pleural effusions. No acute bony abnormalities. IMPRESSION: 1. Mild congestive heart failure. Electronically Signed   By: Randa Ngo M.D.   On: 07/11/2019 22:12    EKG: Independently reviewed. Undetermined rhythm, rate 105, RBBB.   Assessment/Plan   1. Thoracoabdominal aneurysm with leak  - Patient with hx of thoracoabdominal aortic aneurysm presents with abdominal pain and SOB and is found on non-contrast CT to have increase in diameter from 5.2 cm in 2019 to 7.2 cm now with findings suggestive of leak  - CTS surgery consulting and much appreciated, recommend Eliquis reversal and comfort care  - Eliquis is being reversed, patient remains hemodynamically stable and denies pain currently    2. Acute on chronic diastolic CHF  - Patient had been complaining of SOB, was saturating in mid-80s in ED, and is noted to have exam and CXR-findings consistent with CHF  - Diurese with Lasix 40 mg IV q12h, monitor weight and I/Os, hold ACE-i in light of increased creatinine, and continue beta-blocker as tolerated    3. CKD IV  - SCr is 2.75 on admission, up from 2.4 two weeks ago and 2.0 prior to that  - He has BOO on CT with bilateral hydroureteronephrosis  - Place Foley, hold ACE-i, renally-dose medications, repeat chem panel in am     4. Bladder outlet obstruction   - Patient has BPH with hx of bladder outlet obstruction, reported abdominal pain and was found on CT in ED to have bilateral hydroureteronephrosis related to BOO   - Place Foley, continue Flomax and Proscar    5. Atrial fibrillation  - In rate-controlled atrial fibrillation on admission  -  CHADS-VASc is at least 31 (age x2, CHF, HTN)  - He had been on Eliquis which was reversed in ED as above  - Continue metoprolol as tolerated   6. Hypertension - BP at goal, continue metoprolol, hold ACE in light of increased creatinine    DVT prophylaxis: SCDs Code Status: Full code per daughter at bedside Family Communication: Daughter updated at bedside Disposition Plan:  Patient is  from: SNF  Anticipated d/c is to: SNF Anticipated d/c date is: 07/14/19 Patient currently: has new supplemental O2 requirement and undergoing diuresis  Consults called: CTS  Admission status: Inpatient     Vianne Bulls, MD [facility] Pager: See [email or web address]  If 7AM-7PM, please contact the daytime attending [email or web address]  07/12/2019, 4:49 AM

## 2019-07-12 NOTE — Progress Notes (Signed)
CT Surgery note  Images of CT scan of abdomen reviewed and compared with scan 2019. I discussed the patient with Dr Dina Rich in the ED for coordination of care.The patient presented with weakness, SOB and low O2 sat with abdominal pain.CT scan 2019 shows 5.2 cm thoracoabdominal aneurysm at the diaphragm which now measures 7.2 cm with possible leak but contrast not given due to creatinine of 2.5. The patient is hemodynamically stable. He lives in a nursing facility and is non ambulatory, seen yesterday in the cardiology office for hx afib [on Eliquis} and diastolic heart failure.  The patient would not  benefit or survive from surgical intervention on the aorta- recommend reversal of Eliquis and comfort care.  Ivin Poot MD

## 2019-07-13 ENCOUNTER — Other Ambulatory Visit (HOSPITAL_COMMUNITY): Payer: Medicare Other

## 2019-07-13 DIAGNOSIS — Z7189 Other specified counseling: Secondary | ICD-10-CM

## 2019-07-13 DIAGNOSIS — Z515 Encounter for palliative care: Secondary | ICD-10-CM

## 2019-07-13 LAB — BASIC METABOLIC PANEL
Anion gap: 10 (ref 5–15)
BUN: 39 mg/dL — ABNORMAL HIGH (ref 8–23)
CO2: 28 mmol/L (ref 22–32)
Calcium: 8.9 mg/dL (ref 8.9–10.3)
Chloride: 103 mmol/L (ref 98–111)
Creatinine, Ser: 2 mg/dL — ABNORMAL HIGH (ref 0.61–1.24)
GFR calc Af Amer: 33 mL/min — ABNORMAL LOW (ref >60)
GFR calc non Af Amer: 28 mL/min — ABNORMAL LOW (ref >60)
Glucose, Bld: 130 mg/dL — ABNORMAL HIGH (ref 70–99)
Potassium: 3 mmol/L — ABNORMAL LOW (ref 3.5–5.1)
Sodium: 141 mmol/L (ref 135–145)

## 2019-07-13 LAB — URINE CULTURE: Culture: NO GROWTH

## 2019-07-13 MED ORDER — POTASSIUM CHLORIDE CRYS ER 20 MEQ PO TBCR
40.0000 meq | EXTENDED_RELEASE_TABLET | Freq: Two times a day (BID) | ORAL | Status: AC
  Administered 2019-07-13 (×2): 40 meq via ORAL
  Filled 2019-07-13 (×2): qty 2

## 2019-07-13 MED ORDER — POLYETHYLENE GLYCOL 3350 17 G PO PACK
17.0000 g | PACK | Freq: Every day | ORAL | Status: DC
  Administered 2019-07-13: 17 g via ORAL
  Filled 2019-07-13: qty 1

## 2019-07-13 MED ORDER — SENNOSIDES-DOCUSATE SODIUM 8.6-50 MG PO TABS
1.0000 | ORAL_TABLET | Freq: Two times a day (BID) | ORAL | Status: DC
  Administered 2019-07-13 – 2019-07-14 (×3): 1 via ORAL
  Filled 2019-07-13 (×3): qty 1

## 2019-07-13 NOTE — Progress Notes (Signed)
Pt refused remaining vitals

## 2019-07-13 NOTE — Progress Notes (Signed)
Progress Note  Patient Name: [name] [name] Date of Encounter: 07/13/2019  Primary Cardiologist: [name], MD 09/23/2018 televisit K. Purcell Nails, DNP 07/11/2019  Subjective   Feeling well today. Denies pain. Daughter asking to keep foley in the setting of urinary retention  Inpatient Medications    Scheduled Meds: . amLODipine  10 mg Oral Daily  . Chlorhexidine Gluconate Cloth  6 each Topical Daily  . finasteride  5 mg Oral Daily  . furosemide  40 mg Intravenous Q12H  . metoprolol tartrate  100 mg Oral BID  . polyethylene glycol  17 g Oral Daily  . potassium chloride  40 mEq Oral BID WC  . senna-docusate  1 tablet Oral BID  . sodium chloride flush  3 mL Intravenous Once  . sodium chloride flush  3 mL Intravenous Q12H  . tamsulosin  0.4 mg Oral QPC supper  . umeclidinium bromide  1 puff Inhalation Daily   Continuous Infusions: . sodium chloride    . cefTRIAXone (ROCEPHIN)  IV 1 g (07/13/19 0927)   PRN Meds: sodium chloride, acetaminophen, albuterol, HYDROmorphone, labetalol, ondansetron (ZOFRAN) IV, Resource ThickenUp Clear, sodium chloride flush   Vital Signs    Vitals:   07/12/19 2014 07/12/19 2300 07/13/19 0405 07/13/19 0736  BP: (!) 142/83 124/74 131/85 132/60  Pulse: 79 89 66 84  Resp: (!) 21 20 19 19   Temp:  98.6 F (37 C) 98.4 F (36.9 C) (!) 97.5 F (36.4 C)  TempSrc:  Oral Oral Oral  SpO2: 92% 98% 94% 96%  Weight:      Height:        Intake/Output Summary (Last 24 hours) at 07/13/2019 1025 Last data filed at 07/13/2019 0420 Gross per 24 hour  Intake 100.84 ml  Output 3500 ml  Net -3399.16 ml   Filed Weights   07/11/19 2000  Weight: 90.3 kg    Physical Exam   General: Elderly, NAD Neck: Negative for carotid bruits. No JVD Lungs:Clear to ausculation bilaterally. Breathing is unlabored. Cardiovascular: RRR with S1 S2. No murmur Abdomen: Soft, non-tender, non-distended. No obvious abdominal masses. Extremities: No edema. Radial pulses 2+  bilaterally Neuro: Alert and oriented. No focal deficits. No facial asymmetry. MAE spontaneously. Psych: Responds to questions appropriately with normal affect.    Labs    Chemistry Recent Labs  Lab 07/11/19 1220 07/11/19 2002 07/13/19 0424  NA 145* 139 141  K 4.0 3.4* 3.0*  CL 102 102 103  CO2 24 21* 28  GLUCOSE 113* 161* 130*  BUN 44* 50* 39*  CREATININE 2.52* 2.75* 2.00*  CALCIUM 9.4 8.9 8.9  GFRNONAA 21* 19* 28*  GFRAA 25* 22* 33*  ANIONGAP  --  16* 10     Hematology Recent Labs  Lab 07/11/19 1220 07/11/19 2002  WBC 14.3* 13.8*  RBC 3.93* 3.62*  HGB 12.2* 11.3*  HCT 37.3* 34.4*  MCV 95 95.0  MCH 31.0 31.2  MCHC 32.7 32.8  RDW 13.0 13.2  PLT 308 268    Cardiac EnzymesNo results for input(s): TROPONINI in the last 168 hours. No results for input(s): TROPIPOC in the last 168 hours.   BNP Recent Labs  Lab 07/11/19 1220 07/11/19 2028  BNP 289.9* 370.6*     DDimer No results for input(s): DDIMER in the last 168 hours.   Radiology    CT ABDOMEN PELVIS WO CONTRAST  Result Date: 07/12/2019 CLINICAL DATA:  Bilateral lower quadrant abdominal pain, generalized weakness, emesis EXAM: CT ABDOMEN AND PELVIS WITHOUT CONTRAST  TECHNIQUE: Multidetector CT imaging of the abdomen and pelvis was performed following the standard protocol without IV contrast. COMPARISON:  04/05/2013 FINDINGS: Lower chest: Dependent lower lobe consolidation consistent with atelectasis. Trace left pleural effusion. No pneumothorax. Hepatobiliary: Numerous hepatic cysts are unchanged. The gallbladder is grossly normal. Pancreas: Unremarkable. No pancreatic ductal dilatation or surrounding inflammatory changes. Spleen: Normal in size without focal abnormality. Adrenals/Urinary Tract: There is bilateral hydronephrosis and hydroureter, likely due to marked distension of the urinary bladder. Evidence of bladder outlet obstruction with bladder wall thickening and diverticula. There is a nonobstructing 5  mm calculus within the dilated distal right ureter. Multiple right renal cysts are seen. Adrenals are normal. Stomach/Bowel: No bowel obstruction or ileus. Scattered diverticulosis of the distal colon without diverticulitis. No bowel wall thickening or inflammatory change. Vascular/Lymphatic: 7.2 x 6.5 cm aortic aneurysm seen at the level of the diaphragmatic hiatus. There is discontinuity of the mural calcifications, with periaortic fat stranding and high attenuation is seen in the right retrocrural tissues. Appearance is consistent with leaking aneurysm. Evaluation is limited without IV contrast. There is also a 3.9 cm infrarenal abdominal aortic aneurysm. No pathologic adenopathy within the abdomen or pelvis. Reproductive: Prostate is not enlarged. Other: There is no free intraperitoneal fluid or free gas. No abdominal wall hernia. Musculoskeletal: No acute or destructive bony lesions. Reconstructed images demonstrate no additional findings. IMPRESSION: 1. Leaking 7.2 x 6.5 cm aortic aneurysm at the level of the diaphragmatic hiatus. Surgical consultation recommended. 2. Bladder outlet obstruction, with bilateral hydronephrosis and hydroureter as above. 3. Nonobstructing 5 mm distal right ureteral calculus. 4. 3.9 cm infrarenal abdominal aortic aneurysm without evidence of complication. These results were called by telephone at the time of interpretation on 07/12/2019 at 2:39 am to provider PA Charlann Lange , who verbally acknowledged these results. Electronically Signed   By: Randa Ngo M.D.   On: 07/12/2019 02:40   DG Chest 2 View  Result Date: 07/11/2019 CLINICAL DATA:  Shortness of breath EXAM: CHEST - 2 VIEW COMPARISON:  09/05/2017 FINDINGS: Frontal and lateral views of the chest demonstrate an enlarged cardiac silhouette. There is central vascular congestion with patchy bibasilar consolidation and small bilateral pleural effusions. No acute bony abnormalities. IMPRESSION: 1. Mild congestive heart  failure. Electronically Signed   By: Randa Ngo M.D.   On: 07/11/2019 22:12   Telemetry    07/13/19 NSR - Personally Reviewed  ECG    No new tracing as of 07/13/19- Personally Reviewed  Cardiac Studies   ECHO: 08/2017 - Left ventricle: Systolic function was normal. The estimated ejection fraction was in the range of 60% to 65%. Wall motion was normal; there were no regional wall motion abnormalities. Doppler parameters are consistent with indeterminate mean left atrial filling pressure. - Aortic valve: There was trivial regurgitation. - Mitral valve: Valve area by pressure half-time: 2.1 cm^2. - Left atrium: The atrium was mildly dilated. - Pulmonary arteries: PA peak pressure: 33 mm Hg (S). - Pericardium, extracardiac: A trivial pericardial effusion was identified.  CATH: 05/08/2014 ANGIOGRAPHY:  The left main coronary artery was angiographically normal and bifurcated into the LAD and left circumflex coronary artery.   The LAD Was occluded proximally after the takeoff of the very proximal septal perforating artery. There was collateralization to the diagonal vessel in the mid distal LAD system both from right to left and mild left to left collaterals.  The left circumflex coronary artery was A moderate size vessel that gave rise to one major bifurcating obtuse  marginal branch. There was mild 20% proximal circumflex stenosis and 30% mid OM stenosis.  The RCA was a large dominant vessel that gave rise to a large PDA and PLA vessel. There was a focal 95% stenosis after the acute margin and proximal to the takeoff of a large PDA system. There were collaterals supplying the LAD which were jeopardized by this stenosis.  Left ventriculography revealed normal global LV contractility with mild distal anterolateral hypocontractility. Ejection fraction is at least 55%. There was no evidence for mitral regurgitation.   Following PCI to the large dominant RCA the  95% stenosis was reduced to 0% with ultimate insertion of a science Alpine 3.518 mm DES stent postdilated to 3.7 mm. There was brisk TIMI-3 flow. There was no for dissection. There were significantly improved collaterals supplying the RCA following the PCI procedure.   Total contrast used: 160 cc Omnipaque  IMPRESSION:  Preserved global LV contractility with mild distal anterolateral hypocontractility an ejection fraction of at least 55%.  Multivessel CAD with old total occlusion of the proximal LAD with left to left and right to left collaterals, mild 20% proximal circumflex and 30% mid OM stenosis; and 30% proximal RCA stenosis with a 95% distal RCA stenosis proximal to the PDA takeoff with jeopardized collaterals to the LAD.  Successful PCI with PTCA/DES stenting with insertion of a 3.518 mm Xience Alpine DES stent postdilated 3.7 mm the 95% RCA stenosis being reduced to 0%.   RECOMMENDATION:  The patient will continue with dual antiplatelet therapy for minimum of one-year. Medical therapy will be continued for his concomitant CAD with previously documented old total LAD occlusion with collateralization. The collaterals are now improved following PCI to the RCA.  Patient Profile     86 y.o. male with a hx of CAD with known occlusion of proximal LAD and collaterals, DES RCA 2016, D-CHF, PAF RVR, COPD, Cancer, dementia, urinary retention, who is being seen today for preop evaluation of repairing a leaking thoracic aortic aneurysm at the request of Dr Tyrell Antonio.  Assessment & Plan    1. Endovascular thoracic aneurysmal leak: -Presented to [facility] with c/o generalized weakness and hypoxia  -CT scan performed showed a significant enlargement of thoracoabdominal aneurysm to 7.2 cm with leak>>seen by Dr. Prescott Gum who felt that patient not a surgical candidate  -Eliquis was reversed and held  -Give IV lasix 40mg  BID for A>C HF as below    2.Acute on chronic diastolic  CHF: -Echocardiogram with normal LVEF at 60-65% today, 09/03/17 -Weight, 199lb today>>dry weight noted to be between 194-198lb -I&O, net negative 4.4L -Continue IV Lasix 67mf BID -Creatinine improved today   2. CAD: -No ischemic symptoms -Will continue metoprolol, Imdur>>no ASA>>holding Eliquis secondary to #1 -Continue Lipitor 20mg  daily   3. Persistent atrial fibrillation: -Maintaining NSR and controlled on Metoprolol 100mg  twice daily -No AC as above -CHA2DS2VASc =5 (age, HTN, vascular disease)  4. CKD3: -Creatinine, 2.00 today>>>down from 2.75 yesterday   5. Urinary retention: -Has had issues with urinary retention in the past>>daughter asking for re-scan of bladder and to keep foley at discharge for patient comfort     Signed, Kathyrn Drown NP-C [place] Pager: [phone] 07/13/2019, 10:25 AM     For questions or updates, please contact   Please consult [email or web address] for contact info under Cardiology/STEMI.

## 2019-07-13 NOTE — Progress Notes (Signed)
Manufacturing engineer (ACC)  Received request from Vinie Sill, PMT NP,  for hospice services at [facility] after discharge.  Chart and pt information under review by [facility] physician.  Hospice eligibility pending at this time.  Hospital liaison spoke with daughter [name] to initiate education related to hospice philosophy and services and to answer any questions at this time. [name] verbalized understanding of information given and had several thoughtful questions she would like to discuss with her sisters present.  Per discussion the plan is to have a family meeting at [facility] tomorrow so that all the daughters can have an opportunity to ask questions and to consider hospice care vs palliative.    ACC information and contact numbers given to [facility].  Above information shared with [employer] Manager.  Please call with any questions or concerns.  Thank you for the opportunity to participate in this patient's care.  Domenic Moras, BSN, RN [facility] [phone] (24h on call)

## 2019-07-13 NOTE — Progress Notes (Signed)
PROGRESS NOTE    [name] [name]  FUX:[id number] DOB: 1925/03/25 DOA: 07/11/2019 PCP: Jodi Marble, MD   Brief Narrative: 86 year old with past medical history significant for chronic diastolic heart failure, hypertension, BPH, CKD presented from his skilled nursing facility for evaluation of shortness of breath and abdominal pain.  History was obtained primarily by daughter who was at bedside.  Patient reportedly had his diuretic decreased recently due to worsening renal function, he then developed short of breath and increasing swelling which prompted the diuretic to be increased again.  Patient did not feel any improvement.  Patient presented with worsening shortness of breath, abdominal pain, difficulty urinating and one episode of vomiting.  Evaluation in the ED patient was found to be afebrile oxygen sats in the upper 80s, mild tachypneic, chest x-ray consistent with CHF, CT abdomen and pelvis with notable for 7.2 cm aortic aneurysm with suspected leak as well as bladder outlet obstruction with bilateral hydroureteronephrosis.  Creatinine 2.7, BUN 50.  CT surgery was consulted by the ED, who recommended Kcentra and hospice care.    Assessment & Plan:   Principal Problem:   Leaking thoracic aortic aneurysm (TAA) (HCC) Active Problems:   Essential hypertension   Bladder outlet obstruction   CAD S/P percutaneous coronary angioplasty   Acute on chronic diastolic congestive heart failure (HCC)   Atrial fibrillation, chronic (HCC)   COPD (chronic obstructive pulmonary disease) (HCC)   CKD (chronic kidney disease), stage IV (HCC)  1-Thoracoabdominal aneurysm with leak ; Patient evaluated by Dr. Darcey Nora, patient is not a candidate for surgery.  Recommendation was for reversal of Eliquis.  Patient received Kcentra. Dr. Darcey Nora recommended comfort care. Blood pressure control.  I have order IV labetalol for systolic blood pressure more than 130.  Continue with Norvasc, increase to 10 mg.   Resume metoprolol twice daily Palliative care consulted for goals of care Dilaudid as needed for pain  2-Acute on chronic diastolic heart failure: Continue with IV Lasix 40 mg IV twice daily.  Hold ACE due to increased creatinine. Cardiology consulted Renal function stable.  Negative 4 l.   3-AKI Creatinine 2.7 on admission up from 2. Monitor on IV Lasix. Renal function stable.   4-bladder outlet obstruction: UA with more than 50 white blood cell.  Concern for UTI. Continue with Foley catheter.  Started IV ceftriaxone. Day 2.  follow urine culture; pending   5-A. fib: Continue with metoprolol Discontinue  Eliquis 2 thoracoabdominal aneurysmal. Received Kcentra  6-hypertension: Added IV labetalol.  Continue with metoprolol.  Added Norvasc 7-dysphagia: Speech swallow evaluation.  Add thickening to liquids   HypoKalemia: Replete with oral potassium Estimated body mass index is 26.99 kg/m as calculated from the following:   Height as of this encounter: 6' (1.829 m).   Weight as of this encounter: 90.3 kg.   DVT prophylaxis: SCDs Code Status: DNR Family Communication: Daughter at bedside Disposition Plan:  Status is: Inpatient  Remains inpatient appropriate because:IV treatments appropriate due to intensity of illness or inability to take PO   Dispo: The patient is from: SNF              Anticipated d/c is to: family considering returning to SNF              Anticipated d/c date is: 1 day              Patient currently is not medically stable to d/c.        Consultants:   CVTS  Cardiology  Procedures:   none  Antimicrobials:  Ceftriaxone  Subjective: He denies abdominal pain. He is breathing better. He is able to lying down flat.  He has been eating well per daughter.   Objective: Vitals:   07/12/19 2300 07/13/19 0405 07/13/19 0736 07/13/19 1201  BP: 124/74 131/85 132/60 122/75  Pulse: 89 66 84 74  Resp: 20 19 19 18   Temp: 98.6 F (37 C) 98.4 F  (36.9 C) (!) 97.5 F (36.4 C) 97.9 F (36.6 C)  TempSrc: Oral Oral Oral Oral  SpO2: 98% 94% 96% 93%  Weight:      Height:        Intake/Output Summary (Last 24 hours) at 07/13/2019 1438 Last data filed at 07/13/2019 0420 Gross per 24 hour  Intake 100.84 ml  Output 2100 ml  Net -1999.16 ml   Filed Weights   07/11/19 2000  Weight: 90.3 kg    Examination:  General exam: NAD Respiratory system: CTA Cardiovascular system: S 1, S 2 RRR Gastrointestinal system: BS present, soft,  nt Central nervous system: Alert, answer questions.  Extremities: trace edema   Data Reviewed: I have personally reviewed following labs and imaging studies  CBC: Recent Labs  Lab 07/11/19 1220 07/11/19 2002  WBC 14.3* 13.8*  HGB 12.2* 11.3*  HCT 37.3* 34.4*  MCV 95 95.0  PLT 308 585   Basic Metabolic Panel: Recent Labs  Lab 07/11/19 1220 07/11/19 2002 07/12/19 0523 07/13/19 0424  NA 145* 139  --  141  K 4.0 3.4*  --  3.0*  CL 102 102  --  103  CO2 24 21*  --  28  GLUCOSE 113* 161*  --  130*  BUN 44* 50*  --  39*  CREATININE 2.52* 2.75*  --  2.00*  CALCIUM 9.4 8.9  --  8.9  MG  --   --  2.0  --    GFR: Estimated Creatinine Clearance: 25.9 mL/min (A) (by C-G formula based on SCr of 2 mg/dL (H)). Liver Function Tests: No results for input(s): AST, ALT, ALKPHOS, BILITOT, PROT, ALBUMIN in the last 168 hours. No results for input(s): LIPASE, AMYLASE in the last 168 hours. No results for input(s): AMMONIA in the last 168 hours. Coagulation Profile: No results for input(s): INR, PROTIME in the last 168 hours. Cardiac Enzymes: No results for input(s): CKTOTAL, CKMB, CKMBINDEX, TROPONINI in the last 168 hours. BNP (last 3 results) No results for input(s): PROBNP in the last 8760 hours. HbA1C: No results for input(s): HGBA1C in the last 72 hours. CBG: Recent Labs  Lab 07/11/19 2025  GLUCAP 154*   Lipid Profile: No results for input(s): CHOL, HDL, LDLCALC, TRIG, CHOLHDL, LDLDIRECT  in the last 72 hours. Thyroid Function Tests: No results for input(s): TSH, T4TOTAL, FREET4, T3FREE, THYROIDAB in the last 72 hours. Anemia Panel: No results for input(s): VITAMINB12, FOLATE, FERRITIN, TIBC, IRON, RETICCTPCT in the last 72 hours. Sepsis Labs: No results for input(s): PROCALCITON, LATICACIDVEN in the last 168 hours.  Recent Results (from the past 240 hour(s))  SARS Coronavirus 2 by RT PCR (hospital order, performed in [facility] hospital lab) Nasopharyngeal Nasopharyngeal Swab     Status: None   Collection Time: 07/12/19  3:22 AM   Specimen: Nasopharyngeal Swab  Result Value Ref Range Status   SARS Coronavirus 2 NEGATIVE NEGATIVE Final    Comment: (NOTE) SARS-CoV-2 target nucleic acids are NOT DETECTED.  The SARS-CoV-2 RNA is generally detectable in upper and lower respiratory specimens during  the acute phase of infection. The lowest concentration of SARS-CoV-2 viral copies this assay can detect is 250 copies / mL. A negative result does not preclude SARS-CoV-2 infection and should not be used as the sole basis for treatment or other patient management decisions.  A negative result may occur with improper specimen collection / handling, submission of specimen other than nasopharyngeal swab, presence of viral mutation(s) within the areas targeted by this assay, and inadequate number of viral copies (<250 copies / mL). A negative result must be combined with clinical observations, patient history, and epidemiological information.  Fact Sheet for Patients:   StrictlyIdeas.no  Fact Sheet for Healthcare Providers: BankingDealers.co.za  This test is not yet approved or  cleared by the [place] FDA and has been authorized for detection and/or diagnosis of SARS-CoV-2 by FDA under an Emergency Use Authorization (EUA).  This EUA will remain in effect (meaning this test can be used) for the duration of the COVID-19  declaration under Section 564(b)(1) of the Act, 21 U.S.C. section 360bbb-3(b)(1), unless the authorization is terminated or revoked sooner.  Performed at [facility], [place] [address], [place], [place] [address]          Radiology Studies: CT ABDOMEN PELVIS WO CONTRAST  Result Date: 07/12/2019 CLINICAL DATA:  Bilateral lower quadrant abdominal pain, generalized weakness, emesis EXAM: CT ABDOMEN AND PELVIS WITHOUT CONTRAST TECHNIQUE: Multidetector CT imaging of the abdomen and pelvis was performed following the standard protocol without IV contrast. COMPARISON:  04/05/2013 FINDINGS: Lower chest: Dependent lower lobe consolidation consistent with atelectasis. Trace left pleural effusion. No pneumothorax. Hepatobiliary: Numerous hepatic cysts are unchanged. The gallbladder is grossly normal. Pancreas: Unremarkable. No pancreatic ductal dilatation or surrounding inflammatory changes. Spleen: Normal in size without focal abnormality. Adrenals/Urinary Tract: There is bilateral hydronephrosis and hydroureter, likely due to marked distension of the urinary bladder. Evidence of bladder outlet obstruction with bladder wall thickening and diverticula. There is a nonobstructing 5 mm calculus within the dilated distal right ureter. Multiple right renal cysts are seen. Adrenals are normal. Stomach/Bowel: No bowel obstruction or ileus. Scattered diverticulosis of the distal colon without diverticulitis. No bowel wall thickening or inflammatory change. Vascular/Lymphatic: 7.2 x 6.5 cm aortic aneurysm seen at the level of the diaphragmatic hiatus. There is discontinuity of the mural calcifications, with periaortic fat stranding and high attenuation is seen in the right retrocrural tissues. Appearance is consistent with leaking aneurysm. Evaluation is limited without IV contrast. There is also a 3.9 cm infrarenal abdominal aortic aneurysm. No pathologic adenopathy within the abdomen or pelvis. Reproductive:  Prostate is not enlarged. Other: There is no free intraperitoneal fluid or free gas. No abdominal wall hernia. Musculoskeletal: No acute or destructive bony lesions. Reconstructed images demonstrate no additional findings. IMPRESSION: 1. Leaking 7.2 x 6.5 cm aortic aneurysm at the level of the diaphragmatic hiatus. Surgical consultation recommended. 2. Bladder outlet obstruction, with bilateral hydronephrosis and hydroureter as above. 3. Nonobstructing 5 mm distal right ureteral calculus. 4. 3.9 cm infrarenal abdominal aortic aneurysm without evidence of complication. These results were called by telephone at the time of interpretation on 07/12/2019 at 2:39 am to provider PA Charlann Lange , who verbally acknowledged these results. Electronically Signed   By: Randa Ngo M.D.   On: 07/12/2019 02:40   DG Chest 2 View  Result Date: 07/11/2019 CLINICAL DATA:  Shortness of breath EXAM: CHEST - 2 VIEW COMPARISON:  09/05/2017 FINDINGS: Frontal and lateral views of the chest demonstrate an enlarged cardiac silhouette.  There is central vascular congestion with patchy bibasilar consolidation and small bilateral pleural effusions. No acute bony abnormalities. IMPRESSION: 1. Mild congestive heart failure. Electronically Signed   By: Randa Ngo M.D.   On: 07/11/2019 22:12        Scheduled Meds: . amLODipine  10 mg Oral Daily  . Chlorhexidine Gluconate Cloth  6 each Topical Daily  . finasteride  5 mg Oral Daily  . furosemide  40 mg Intravenous Q12H  . metoprolol tartrate  100 mg Oral BID  . polyethylene glycol  17 g Oral Daily  . potassium chloride  40 mEq Oral BID WC  . senna-docusate  1 tablet Oral BID  . sodium chloride flush  3 mL Intravenous Once  . sodium chloride flush  3 mL Intravenous Q12H  . tamsulosin  0.4 mg Oral QPC supper  . umeclidinium bromide  1 puff Inhalation Daily   Continuous Infusions: . sodium chloride    . cefTRIAXone (ROCEPHIN)  IV 1 g (07/13/19 0927)     LOS: 1 day     Time spent: 35 minutes.    Elmarie Shiley, MD [facility]   If 7PM-7AM, please contact night-coverage [email or web address]  07/13/2019, 2:38 PM

## 2019-07-13 NOTE — Evaluation (Signed)
Clinical/Bedside Swallow Evaluation Patient Details  Name: [name] [name] MRN: [id number] Date of Birth: 06/09/1925  Today's Date: 07/13/2019 Time: SLP Start Time (ACUTE ONLY): 0858 SLP Stop Time (ACUTE ONLY): 0917 SLP Time Calculation (min) (ACUTE ONLY): 19 min  Past Medical History:  Past Medical History:  Diagnosis Date  . Anginal pain ([place])   . Arthritis    "all over"  . Cancer ([place])   . CHF (congestive heart failure) ([place])   . Coronary artery disease   . Dementia ([place])   . Depression ~ 41; ~ 1990   PTSD/Mania   . GERD (gastroesophageal reflux disease)   . High cholesterol   . Hypertension   . Hyperthyroidism   . Neuropathy   . Osteoporosis    Past Surgical History:  Past Surgical History:  Procedure Laterality Date  . BACK SURGERY    . CARDIAC CATHETERIZATION  09/2007  . CARDIAC CATHETERIZATION N/A 05/08/2014   Procedure: CORONARY STENT INTERVENTION;  Surgeon: [name], MD; 3.518 mm Xience Alpine DES to the RCA   . LEFT HEART CATHETERIZATION WITH CORONARY ANGIOGRAM N/A 05/08/2014   Procedure: LEFT HEART CATHETERIZATION WITH CORONARY ANGIOGRAM;  Surgeon: [name], MD; LAD 100% (old), CFX 20%, OM1 30%, pRCA 30%, dRCA 95%, EF 55%     . LUMBAR LAMINECTOMY/DECOMPRESSION MICRODISCECTOMY  2011   HPI:  Pt is a 86 y.o. male with medical history significant for chronic diastolic CHF, hypertension, BPH, chronic kidney disease, who presented from his SNF for evaluation of shortness of breath and abdominal pain.  CXR 6/29:  Mild congestive heart failure. CT abdomen: Leaking 7.2 x 6.5 cm aortic aneurysm at the level of the diaphragmatic hiatus. MBS 07/01/17: Trace silent aspiration did occur with consecutive straw sips.    Assessment / Plan / Recommendation Clinical Impression  Pt was seen for bedside swallow evaluation with his daughter present. Pt's daughters (one at bedside and one on phone) reported that the pt has been demonstrating signs of aspiration with thin liquids  characterized by significant coughing which inconsistently results in emesis. Oral mechanism exam was [facility] with mild facial weakness and reduced velar elevation. Dentition was adequate. He tolerated all solids and liquids without signs or symptoms of aspiration but does have a history of silent aspiration with thin liquids. Mastication was prolonged with regular texture solids and pt requested liquids to assist with mastication/bolus formation. The possibility of conducting a modified barium swallow study was discussed with the pt's family. However, considering their pending discussion with palliative care, pt's family indicated that they would rather not "put him through that" and it was agreed that it would be deferred. A dysphagia 3 diet with nectar thick liquids is therefore recommended at this time and SLP will follow pt briefly.  SLP Visit Diagnosis: Dysphagia, unspecified (R13.10)    Aspiration Risk  Mild aspiration risk    Diet Recommendation Regular;Nectar-thick liquid   Liquid Administration via: Cup;No straw Medication Administration: Whole meds with puree Supervision: Staff to assist with self feeding Compensations: Slow rate;Small sips/bites Postural Changes: Seated upright at 90 degrees    Other  Recommendations Oral Care Recommendations: Oral care BID   Follow up Recommendations Other (comment) (TBD)      Frequency and Duration min 2x/week  1 week       Prognosis Prognosis for Safe Diet Advancement: Fair Barriers to Reach Goals: Cognitive deficits      Swallow Study   General Date of Onset: 07/12/19 HPI: Pt is a 86 y.o. male  with medical history significant for chronic diastolic CHF, hypertension, BPH, chronic kidney disease, who presented from his SNF for evaluation of shortness of breath and abdominal pain.  CXR 6/29:  Mild congestive heart failure. CT abdomen: Leaking 7.2 x 6.5 cm aortic aneurysm at the level of the diaphragmatic hiatus. MBS 07/01/17: Trace silent  aspiration did occur with consecutive straw sips.  Type of Study: Bedside Swallow Evaluation Previous Swallow Assessment: See HPI Diet Prior to this Study: Regular;Nectar-thick liquids Temperature Spikes Noted: No Respiratory Status: Room air History of Recent Intubation: No Behavior/Cognition: Alert;Cooperative;Pleasant mood Oral Cavity Assessment: Within Functional Limits Oral Care Completed by SLP: No Oral Cavity - Dentition: Adequate natural dentition Vision: Functional for self-feeding Self-Feeding Abilities: Able to feed self Patient Positioning: Upright in bed;Postural control adequate for testing Baseline Vocal Quality: Normal Volitional Cough: Weak Volitional Swallow: Able to elicit    Oral/Motor/Sensory Function Overall Oral Motor/Sensory Function: Within functional limits Facial ROM: Within Functional Limits Facial Symmetry: Within Functional Limits Facial Strength: Within Functional Limits Facial Sensation: Within Functional Limits Lingual ROM: Reduced right;Reduced left;Suspected CN XII (hypoglossal) dysfunction Lingual Symmetry: Within Functional Limits Lingual Strength: Reduced;Suspected CN XII (hypoglossal) dysfunction Lingual Sensation: Within Functional Limits Velum: Impaired left Mandible: Within Functional Limits   Ice Chips Ice chips: Within functional limits Presentation: Spoon   Thin Liquid Thin Liquid: Within functional limits Presentation: Straw;Cup    Nectar Thick Nectar Thick Liquid: Not tested   Honey Thick Honey Thick Liquid: Not tested   Puree Puree: Within functional limits Presentation: Spoon   Solid     Solid: Within functional limits Presentation: [place] [address] Hardin Negus, [place], [place] Office number [phone] Pager [phone]  Horton Marshall 07/13/2019,12:50 PM

## 2019-07-13 NOTE — Consult Note (Addendum)
Consultation Note Date: 07/13/2019   Patient Name: [name] [name]  DOB: 05-Nov-1925  MRN: [id number]  Age / Sex: 86 y.o., male  PCP: Jodi Marble, MD Referring Physician: Elmarie Shiley, MD  Reason for Consultation: Establishing goals of care  HPI/Patient Profile: 86 y.o. male  with past medical history of diastolic CHF, CAD, hypertension, dememtia, BPH, chronic kidney disease stage 4, neuropathy, GERD, depression admitted on 07/11/2019 with shortness of breath and abd pain related to thoracoabdominal aneurysm with leak, acute on chronic CHF, bladder outlet obstruction with bilateral hydronephrosis and increase climbing creatinine. Not a surgical candidate for aneurysm  Clinical Assessment and Goals of Care: I met today initially with daughter, [name], at [name]. We began initial discussion and then we were joined by Dr. Debara Pickett. Discussed with [name] to meet with family at 3pm today.   I met today at [name] with his 3 daughters Faustino Congress, and Zigmund Daniel. [name] [name] slept during most of our conversation. We discussed overall wishes and goals of care. During our discussion goals become clear for [name] [name] to return to [facility] and this is his home. He tells [place] many times during conversation that he wants to return. They would like to optimize quality of life while continuing treatment of other conditions to optimize his health (diuretics, etc) for whatever time he has left. They would like to avoid rehospitalization if possible. We spent much time discussing scenarios and options for more aggressive intervention vs having plan for treatment of symptoms and comfort while allowing him to remain at [facility]. They lean more towards keeping him at [facility] but if there is a situation where an intervention could be made to reverse and improve his quality and time left to live they  may consider this as well.   We also spent much time discussing palliative care vs hospice at the facility. I feel that based on their goals that hospice would be much more equipped to provide the support needed to achieve above goals. They explain that they had a very bad experience when their mother died and are fearful of having his medications taken away and constant conversations reminding [name] [name] that he is at end of life. I explained that I feel that these concerns will not be an issue with having hospice supports. I expressed that if these are the main concerns to hold [place] back then I can discuss this with hospice liaison and make sure this will not be an issue and have liaison discuss with them as well. They would like for me to discuss with hospice and request follow up from liaison. They will continue to speak as a family.   Of note we also discussed leaving foley catheter accepting risk of infection but allowing for more comfort from bladder outlet obstruction and urinary retention (especially given poor prognosis due to aneurysm). There was a question of having nephrology consult but I did explain to family that I doubt that they would have much to  add or change to his care at this stage. We also discussed that there is no plan to add back anticoagulant.   All questions/concerns addressed. Emotional support provided. Discussed with Chrislyn, hospice liaison, as well as Dr. Tyrell Antonio.   Primary Decision Maker Daughters Faustino Congress, and Zigmund Daniel    SUMMARY OF RECOMMENDATIONS   - DNR already established - Family discussed plan for hospice vs palliative at facility. I have recommended hospice support. Hopeful for return soon.   Code Status/Advance Care Planning:  DNR   Symptom Management:   Would recommend rescue meds to be available at facility in case of emergency. We did discuss use of sublingual medications for comfort at the end. Discussed use of morphine is even more for breathing  struggles than pain.   Palliative Prophylaxis:   Aspiration, Bowel Regimen, Delirium Protocol, Frequent Pain Assessment and Oral Care  Additional Recommendations (Limitations, Scope, Preferences):  Avoid Hospitalization  Psycho-social/Spiritual:   Desire for further Chaplaincy support: Sherryll Burger - family exploring options to have visit from someone from his church. He has not responded well to hospital chaplains previously so this would be for support at his facility.   Additional Recommendations: Education on Hospice and Grief/Bereavement Support  Prognosis:   Overall prognosis poor due to leaking aortic aneurysm and could be days or weeks prognosis.   Discharge Planning: Most likely return to [facility] with hospice. Family continues to discuss if hospice is what they want.      Primary Diagnoses: Present on Admission: . Leaking thoracic aortic aneurysm (TAA) ([place]) . Acute on chronic diastolic congestive heart failure ([place]) . Atrial fibrillation, chronic ([place]) . COPD (chronic obstructive pulmonary disease) ([place]) . Essential hypertension . CKD (chronic kidney disease), stage IV ([place])   I have reviewed the medical record, interviewed the patient and family, and examined the patient. The following aspects are pertinent.  Past Medical History:  Diagnosis Date  . Anginal pain ([place])   . Arthritis    "all over"  . Cancer ([place])   . CHF (congestive heart failure) ([place])   . Coronary artery disease   . Dementia ([place])   . Depression ~ 31; ~ 1990   PTSD/Mania   . GERD (gastroesophageal reflux disease)   . High cholesterol   . Hypertension   . Hyperthyroidism   . Neuropathy   . Osteoporosis    Social History   Socioeconomic History  . Marital status: Divorced    Spouse name: Not on file  . Number of children: 3  . Years of education: unknown  . Highest education level: Not on file  Occupational History  . Not on file  Tobacco Use  . Smoking status: Former  Smoker    Packs/day: 1.00    Years: 20.00    Pack years: 20.00    Types: Cigarettes    Quit date: 01/12/1962    Years since quitting: 57.5  . Smokeless tobacco: Never Used  Substance and Sexual Activity  . Alcohol use: No    Alcohol/week: 0.0 standard drinks    Comment: 05/08/2014 "he's been in [place] for ~!35 yrs"  . Drug use: No  . Sexual activity: Not on file  Other Topics Concern  . Not on file  Social History Narrative   He was in [name] youth camps as a child. He emigrated to the [place] after World War II. He was staying with his host family in [place] when he got drafted for [place].      09/23/17  He now lives in [place] and his daughter helps in his care.   Social Determinants of Health   Financial Resource Strain:   . Difficulty of Paying Living Expenses:   Food Insecurity:   . Worried About Running Out of Food in the Last Year:   . Ran Out of Food in the Last Year:   Transportation Needs:   . Lack of Transportation (Medical):   . Lack of Transportation (Non-Medical):   Physical Activity:   . Days of Exercise per Week:   . Minutes of Exercise per Session:   Stress:   . Feeling of Stress :   Social Connections:   . Frequency of Communication with Friends and Family:   . Frequency of Social Gatherings with Friends and Family:   . Attends Religious Services:   . Active Member of Clubs or Organizations:   . Attends Club or Organization Meetings:   . Marital Status:    Family History  Problem Relation Age of Onset  . CAD Mother   . Asthma Mother    Scheduled Meds: . amLODipine  10 mg Oral Daily  . Chlorhexidine Gluconate Cloth  6 each Topical Daily  . finasteride  5 mg Oral Daily  . furosemide  40 mg Intravenous Q12H  . metoprolol tartrate  100 mg Oral BID  . polyethylene glycol  17 g Oral Daily  . potassium chloride  40 mEq Oral BID WC  . senna-docusate  1 tablet Oral BID  . sodium chloride flush  3 mL Intravenous Once  . sodium  chloride flush  3 mL Intravenous Q12H  . tamsulosin  0.4 mg Oral QPC supper  . umeclidinium bromide  1 puff Inhalation Daily   Continuous Infusions: . sodium chloride    . cefTRIAXone (ROCEPHIN)  IV 1 g (07/13/19 0927)   PRN Meds:.sodium chloride, acetaminophen, albuterol, HYDROmorphone, labetalol, ondansetron (ZOFRAN) IV, Resource ThickenUp Clear, sodium chloride flush Allergies  Allergen Reactions  . Cortisone Other (See Comments)    Causes emotional problems    Review of Systems  Constitutional: Positive for activity change, appetite change and fatigue.  Respiratory: Negative for shortness of breath.   Neurological: Positive for weakness.    Physical Exam Vitals and nursing note reviewed.  Constitutional:      General: He is not in acute distress.    Appearance: He is ill-appearing.  Cardiovascular:     Rate and Rhythm: Normal rate.  Pulmonary:     Effort: Pulmonary effort is normal. No tachypnea, accessory muscle usage or respiratory distress.  Abdominal:     Palpations: Abdomen is soft.  Neurological:     Mental Status: He is alert and easily aroused.     Comments: Difficult to understand; unable to fully assess due to difficulty understanding him     Vital Signs: BP 132/60 (BP Location: Left Arm)   Pulse 84   Temp (!) 97.5 F (36.4 C) (Oral)   Resp 19   Ht 6' (1.829 m)   Wt 90.3 kg   SpO2 96%   BMI 26.99 kg/m  Pain Scale: 0-10   Pain Score: 0-No pain   SpO2: SpO2: 96 % O2 Device:SpO2: 96 % O2 Flow Rate: .   IO: Intake/output summary:   Intake/Output Summary (Last 24 hours) at 07/13/2019 1025 Last data filed at 07/13/2019 0420 Gross per 24 hour  Intake 100.84 ml  Output 3500 ml  Net -3399.16 ml    LBM: Last BM Date:  (PTA) Baseline Weight:   Weight: 90.3 kg Most recent weight: Weight: 90.3 kg     Palliative Assessment/Data:     Time In: 1500 Time Out: 1700 Time Total: 120 min Greater than 50%  of this time was spent counseling and  coordinating care related to the above assessment and plan.  Signed by:  , NP Palliative Medicine Team Pager # [phone] (M-F 8a-5p) Team Phone # [phone] (Nights/Weekends)                

## 2019-07-14 ENCOUNTER — Inpatient Hospital Stay (HOSPITAL_COMMUNITY): Payer: Medicare Other

## 2019-07-14 DIAGNOSIS — I350 Nonrheumatic aortic (valve) stenosis: Secondary | ICD-10-CM

## 2019-07-14 DIAGNOSIS — N139 Obstructive and reflux uropathy, unspecified: Secondary | ICD-10-CM

## 2019-07-14 LAB — BASIC METABOLIC PANEL
Anion gap: 9 (ref 5–15)
BUN: 42 mg/dL — ABNORMAL HIGH (ref 8–23)
CO2: 26 mmol/L (ref 22–32)
Calcium: 8.7 mg/dL — ABNORMAL LOW (ref 8.9–10.3)
Chloride: 105 mmol/L (ref 98–111)
Creatinine, Ser: 2.13 mg/dL — ABNORMAL HIGH (ref 0.61–1.24)
GFR calc Af Amer: 30 mL/min — ABNORMAL LOW (ref >60)
GFR calc non Af Amer: 26 mL/min — ABNORMAL LOW (ref >60)
Glucose, Bld: 123 mg/dL — ABNORMAL HIGH (ref 70–99)
Potassium: 3.7 mmol/L (ref 3.5–5.1)
Sodium: 140 mmol/L (ref 135–145)

## 2019-07-14 LAB — ECHOCARDIOGRAM COMPLETE
Height: 72 in
Weight: 3184.01 oz

## 2019-07-14 MED ORDER — HYDROMORPHONE HCL 1 MG/ML PO LIQD: 1.0000 mg | ORAL | 0 refills | Status: AC | PRN

## 2019-07-14 MED ORDER — CEPHALEXIN 500 MG PO CAPS: 500.0000 mg | ORAL_CAPSULE | Freq: Two times a day (BID) | ORAL | 0 refills | Status: AC

## 2019-07-14 MED ORDER — TRAZODONE HCL 150 MG PO TABS: 75.0000 mg | ORAL_TABLET | Freq: Every day | ORAL | Status: DC

## 2019-07-14 MED ORDER — TRAZODONE HCL 150 MG PO TABS: 75.0000 mg | ORAL_TABLET | Freq: Every day | ORAL | 0 refills | Status: AC

## 2019-07-14 MED ORDER — HYDROCODONE-ACETAMINOPHEN 5-325 MG PO TABS: 1.0000 | ORAL_TABLET | Freq: Four times a day (QID) | ORAL | 0 refills | Status: AC | PRN

## 2019-07-14 MED ORDER — LEVALBUTEROL HCL 1.25 MG/0.5ML IN NEBU: 1.2500 mg | INHALATION_SOLUTION | Freq: Three times a day (TID) | RESPIRATORY_TRACT | 12 refills | Status: AC

## 2019-07-14 MED ORDER — SERTRALINE HCL 25 MG PO TABS
25.0000 mg | ORAL_TABLET | Freq: Every day | ORAL | Status: DC
  Administered 2019-07-14: 25 mg via ORAL

## 2019-07-14 NOTE — Consult Note (Signed)
   [facility] Inpatient Consult   07/14/2019  [name] [name] Jul 28, 1925 [id number]   [facility] Organization [ACO] Patient:  Medicare NextGen   Patient screened for high risk score for unplanned readmission score and for hospitalizations to check if potential [place] Management service needs.  Review of patient's medical record reveals patient is to return to the skilled nursing facility.  Primary Care Provider is Jodi Marble, MD, [facility] is not listed in [facility] Directory. Patient showing on roster with [facility].   Plan:  Will have [facility] RN PAC follow up if patient is a network   For questions contact:   Natividad Brood, RN BSN [facility] Liaison  [phone] business mobile phone Toll free office [phone]  Fax number: [phone] [email or web address]

## 2019-07-14 NOTE — Plan of Care (Signed)
Careplan resolved. [name] [name]

## 2019-07-14 NOTE — Progress Notes (Signed)
Progress Note  Patient Name: [name] [name] Date of Encounter: 07/14/2019  Primary Cardiologist: [name], MD 09/23/2018 televisit K. Lawrence, DNP 07/11/2019  Subjective   I's/O's not accurate - weight not recorded. Creatinine trending up today to 2.13. No specific complaints today. Family wants him to go back to [facility].  Inpatient Medications    Scheduled Meds: . amLODipine  10 mg Oral Daily  . Chlorhexidine Gluconate Cloth  6 each Topical Daily  . finasteride  5 mg Oral Daily  . furosemide  40 mg Intravenous Q12H  . metoprolol tartrate  100 mg Oral BID  . polyethylene glycol  17 g Oral Daily  . senna-docusate  1 tablet Oral BID  . sodium chloride flush  3 mL Intravenous Once  . sodium chloride flush  3 mL Intravenous Q12H  . tamsulosin  0.4 mg Oral QPC supper  . umeclidinium bromide  1 puff Inhalation Daily   Continuous Infusions: . sodium chloride    . cefTRIAXone (ROCEPHIN)  IV 1 g (07/13/19 0927)   PRN Meds: sodium chloride, acetaminophen, albuterol, HYDROmorphone, labetalol, ondansetron (ZOFRAN) IV, Resource ThickenUp Clear, sodium chloride flush   Vital Signs    Vitals:   07/13/19 2200 07/14/19 0020 07/14/19 0341 07/14/19 0826  BP:  (!) 106/46 124/70 132/88  Pulse: 79 71 69 73  Resp: 16 20 20 18   Temp:  98.3 F (36.8 C) 98.2 F (36.8 C) 99 F (37.2 C)  TempSrc:  Oral Oral Oral  SpO2: (!) 89% 95% 92% 95%  Weight:      Height:        Intake/Output Summary (Last 24 hours) at 07/14/2019 0929 Last data filed at 07/13/2019 1327 Gross per 24 hour  Intake 120 ml  Output --  Net 120 ml   Filed Weights   07/11/19 2000  Weight: 90.3 kg    Physical Exam   General: Elderly, NAD Neck: Negative for carotid bruits. No JVD Lungs:Clear to ausculation bilaterally. Breathing is unlabored. Cardiovascular: RRR with S1 S2. No murmur Abdomen: Soft, non-tender, non-distended. No obvious abdominal masses. Extremities: No edema. Radial pulses 2+  bilaterally Neuro: Alert and oriented. No focal deficits. No facial asymmetry. MAE spontaneously. Psych: Responds to questions appropriately with normal affect.    Labs    Chemistry Recent Labs  Lab 07/11/19 2002 07/13/19 0424 07/14/19 0254  NA 139 141 140  K 3.4* 3.0* 3.7  CL 102 103 105  CO2 21* 28 26  GLUCOSE 161* 130* 123*  BUN 50* 39* 42*  CREATININE 2.75* 2.00* 2.13*  CALCIUM 8.9 8.9 8.7*  GFRNONAA 19* 28* 26*  GFRAA 22* 33* 30*  ANIONGAP 16* 10 9     Hematology Recent Labs  Lab 07/11/19 1220 07/11/19 2002  WBC 14.3* 13.8*  RBC 3.93* 3.62*  HGB 12.2* 11.3*  HCT 37.3* 34.4*  MCV 95 95.0  MCH 31.0 31.2  MCHC 32.7 32.8  RDW 13.0 13.2  PLT 308 268    Cardiac EnzymesNo results for input(s): TROPONINI in the last 168 hours. No results for input(s): TROPIPOC in the last 168 hours.   BNP Recent Labs  Lab 07/11/19 1220 07/11/19 2028  BNP 289.9* 370.6*     DDimer No results for input(s): DDIMER in the last 168 hours.   Radiology    No results found. Telemetry    07/13/19 NSR - Personally Reviewed  ECG    No new tracing as of 07/13/19- Personally Reviewed  Cardiac Studies   ECHO: 08/2017 -  Left ventricle: Systolic function was normal. The estimated ejection fraction was in the range of 60% to 65%. Wall motion was normal; there were no regional wall motion abnormalities. Doppler parameters are consistent with indeterminate mean left atrial filling pressure. - Aortic valve: There was trivial regurgitation. - Mitral valve: Valve area by pressure half-time: 2.1 cm^2. - Left atrium: The atrium was mildly dilated. - Pulmonary arteries: PA peak pressure: 33 mm Hg (S). - Pericardium, extracardiac: A trivial pericardial effusion was identified.  CATH: 05/08/2014 ANGIOGRAPHY:  The left main coronary artery was angiographically normal and bifurcated into the LAD and left circumflex coronary artery.   The LAD Was occluded proximally after  the takeoff of the very proximal septal perforating artery. There was collateralization to the diagonal vessel in the mid distal LAD system both from right to left and mild left to left collaterals.  The left circumflex coronary artery was A moderate size vessel that gave rise to one major bifurcating obtuse marginal branch. There was mild 20% proximal circumflex stenosis and 30% mid OM stenosis.  The RCA was a large dominant vessel that gave rise to a large PDA and PLA vessel. There was a focal 95% stenosis after the acute margin and proximal to the takeoff of a large PDA system. There were collaterals supplying the LAD which were jeopardized by this stenosis.  Left ventriculography revealed normal global LV contractility with mild distal anterolateral hypocontractility. Ejection fraction is at least 55%. There was no evidence for mitral regurgitation.   Following PCI to the large dominant RCA the 95% stenosis was reduced to 0% with ultimate insertion of a science Alpine 3.518 mm DES stent postdilated to 3.7 mm. There was brisk TIMI-3 flow. There was no for dissection. There were significantly improved collaterals supplying the RCA following the PCI procedure.   Total contrast used: 160 cc Omnipaque  IMPRESSION:  Preserved global LV contractility with mild distal anterolateral hypocontractility an ejection fraction of at least 55%.  Multivessel CAD with old total occlusion of the proximal LAD with left to left and right to left collaterals, mild 20% proximal circumflex and 30% mid OM stenosis; and 30% proximal RCA stenosis with a 95% distal RCA stenosis proximal to the PDA takeoff with jeopardized collaterals to the LAD.  Successful PCI with PTCA/DES stenting with insertion of a 3.518 mm Xience Alpine DES stent postdilated 3.7 mm the 95% RCA stenosis being reduced to 0%.   RECOMMENDATION:  The patient will continue with dual antiplatelet therapy for minimum of  one-year. Medical therapy will be continued for his concomitant CAD with previously documented old total LAD occlusion with collateralization. The collaterals are now improved following PCI to the RCA.  Patient Profile     86 y.o. male with a hx of CAD with known occlusion of proximal LAD and collaterals, DES RCA 2016, D-CHF, PAF RVR, COPD, Cancer, dementia, urinary retention, who is being seen today for preop evaluation of repairing a leaking thoracic aortic aneurysm at the request of Dr Tyrell Antonio.  Assessment & Plan    1. Endovascular thoracic aneurysmal leak: -Presented to [facility] with c/o generalized weakness and hypoxia  -CT scan performed showed a significant enlargement of thoracoabdominal aneurysm to 7.2 cm with leak>>seen by Dr. Prescott Gum who felt that patient not a surgical candidate  -Eliquis was reversed and held  -High expected mortality without surgery- agree with hospice consideration  2.Acute on chronic diastolic CHF: -Echocardiogram with normal LVEF at 60-65% today, 09/03/17 -Weight not recorded = dry  weight noted to be between 194-198lb, was 199 -I&O, net negative 4.4L -Creatinine rising - will hold additional IV lasix -restart home torsemide 80 mg daily tomorrow - this could be at home.  2. CAD: -No ischemic symptoms -Will continue metoprolol, Imdur>>no ASA>>holding Eliquis secondary to #1 -Continue Lipitor 20mg  daily   3. Persistent atrial fibrillation: -Maintaining NSR and controlled on Metoprolol 100mg  twice daily -No AC as above -CHA2DS2VASc =5 (age, HTN, vascular disease)  4. CKD3: -Creatinine rising again, resume home diuertics -Suspect post-obstruction was an issue, would leave in foley, follow-up with [facility]  5. 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 [name] (urology)  Could d/c today from a cardiology standpoint.  Pixie Casino, MD, [facility], [place] Director of the Advanced Lipid Disorders &  Cardiovascular Risk Reduction Clinic Diplomate of the American Board of Clinical Lipidology Attending Cardiologist  Direct Dial: [phone]  Fax: [phone]  Website:  [email or web address]  07/14/2019, 9:29 AM     For questions or updates, please contact   Please consult [email or web address] for contact info under Cardiology/STEMI.

## 2019-07-14 NOTE — Progress Notes (Signed)
PT Cancellation Note  Patient Details Name: [name] [name] MRN: [id number] DOB: [birth year]   Cancelled Treatment:    Reason Eval/Treat Not Completed: PT screened, no needs identified, will sign off After meeting with family, pt is going to be d/ced back to SNF with hospice services so PT evaluation is not needed at this time. Will sign off for now. Please re-consult if anything changes.    Marguarite Arbour A Shirle Provencal 07/14/2019, 3:29 PM Marisa Severin, PT, DPT Acute Rehabilitation Services Pager [phone] Office [phone]

## 2019-07-14 NOTE — TOC Transition Note (Addendum)
Transition of Care [facility]) - CM/SW Discharge Note   Patient Details  Name: Jobanny Mavis MRN: [id number] Date of Birth: [birth year]  [facility] CM/SW Contact:  Vinie Sill, [place] Phone Number: 07/14/2019, 1:32 PM   Clinical Narrative:     Patient will DC to: [place] Date: 07/14/2019 Family Notified: [name],daughter Transport By: Corey Harold   Per MD patient is ready for discharge. RN, patient, and facility notified of DC. Discharge Summary sent to facility. RN given number for [phone], Room 25. Ambulance transport requested for patient.   Clinical Social Worker signing off.  Thurmond Butts, MSW, LCSWA Clinical Social Worker    Final next level of care: Skilled Nursing Facility Barriers to Discharge: No Barriers Identified   Patient Goals and CMS Choice        Discharge Placement PASRR number recieved: 07/14/19            Patient chooses bed at: [facility] Patient to be transferred to facility by: [facility] Name of family member notified: [name], daughter Patient and family notified of of transfer: 07/14/19  Discharge Plan and Services                                     Social Determinants of Health (SDOH) Interventions     Readmission Risk Interventions No flowsheet data found.

## 2019-07-14 NOTE — Discharge Summary (Addendum)
Physician Discharge Summary  [name] [name] VQM:[id number] DOB: July 10, 1925 DOA: 07/11/2019  PCP: Jodi Marble, MD  Admit date: 07/11/2019 Discharge date: 07/14/2019  Admitted From: SNF Disposition: SNF  Recommendations for Outpatient Follow-up:  1. Controlled BP.  2. Monitor volume status.  3. Follow up with urology for bladder outlet obstruction.  4. Hospice follow up at facility.     Discharge Condition: Stable.  CODE STATUS: DNR Diet recommendation: Dysphagia 3 Nectar Thick.   Brief/Interim Summary: 86 year old with past medical history significant for chronic diastolic heart failure, hypertension, BPH, CKD presented from his skilled nursing facility for evaluation of shortness of breath and abdominal pain.  History was obtained primarily by daughter who was at bedside.  Patient reportedly had his diuretic decreased recently due to worsening renal function, he then developed short of breath and increasing swelling which prompted the diuretic to be increased again.  Patient did not feel any improvement.  Patient presented with worsening shortness of breath, abdominal pain, difficulty urinating and one episode of vomiting.  Evaluation in the ED patient was found to be afebrile oxygen sats in the upper 80s, mild tachypneic, chest x-ray consistent with CHF, CT abdomen and pelvis with notable for 7.2 cm aortic aneurysm with suspected leak as well as bladder outlet obstruction with bilateral hydroureteronephrosis.  Creatinine 2.7, BUN 50.  CT surgery was consulted by the ED, who recommended Kcentra and hospice care.   1-Thoracoabdominal aneurysm with leak; Patient evaluated by Dr. Darcey Nora, patient is not a candidate for surgery.  Recommendation was for reversal of Eliquis.  Patient received Kcentra. Dr. Darcey Nora recommended comfort care. Blood pressure control.  I have order IV labetalol for systolic blood pressure more than 130.  Continue with Norvasc, increase to 10 mg.  Resume  metoprolol twice daily. Palliative care consulted for goals of care. Plan to treat what is possible, Controlled BP, patient to be follow by hospice at facility.  Dilaudid /vicodin PRN for pain or distress.  Will order oxygen in case patient needs it.    2-Acute on chronic diastolic heart failure:  Treated with IV Lasix 40 mg IV twice daily.  Hold ACE due to increased creatinine. Cardiology consulted.Renal function stable.  Negative 4 l.  On room air, dyspnea improved.  Plan to resume torsemide 80 mg daily, starting 7/03. ECHO pending at discharge. Needs to be follow up.   3-AKI Creatinine 2.7 on admission up from 2. Received IV Lasix. Renal function stable.  Plan to transition to torsemide, starting 7/03.  4-Bladder outlet obstruction: UA with more than 50 white blood cell.  Concern for UTI. Continue with Foley catheter.  Started IV ceftriaxone. Day 3.  Urine culture no growth. Plan to discharge on 2 more days oral antibiotics.   5-A. fib: Continue with metoprolol Discontinue  Eliquis 2 thoracoabdominal aneurysmal. Received Kcentra  6-Hypertension:   Continue with metoprolol and norvasc. Received PRN labetalol.  7-Dysphagia: on dysphagia 3 nectar thick.  8-HypoKalemia: resolved with supplementation.     Discharge Diagnoses:  Principal Problem:   Leaking thoracic aortic aneurysm (TAA) (HCC) Active Problems:   Essential hypertension   Bladder outlet obstruction   CAD S/P percutaneous coronary angioplasty   Acute on chronic diastolic congestive heart failure (HCC)   Atrial fibrillation, chronic (HCC)   COPD (chronic obstructive pulmonary disease) (HCC)   CKD (chronic kidney disease), stage IV ([place])   Obstructive uropathy    Discharge Instructions  Discharge Instructions    Diet - low sodium heart healthy   Complete  by: As directed    Increase activity slowly   Complete by: As directed      Allergies as of 07/14/2019      Reactions   Cortisone Other (See  Comments)   Causes emotional problems       Medication List    STOP taking these medications   apixaban 2.5 MG Tabs tablet Commonly known as: Eliquis   ipratropium-albuterol 0.5-2.5 (3) MG/3ML Soln Commonly known as: DUONEB   isosorbide mononitrate 30 MG 24 hr tablet Commonly known as: IMDUR   lisinopril 20 MG tablet Commonly known as: ZESTRIL     TAKE these medications   acetaminophen 500 MG tablet Commonly known as: TYLENOL Take 500-1,000 mg by mouth every 6 (six) hours as needed for mild pain.   albuterol 108 (90 Base) MCG/ACT inhaler Commonly known as: VENTOLIN HFA Inhale 2 puffs into the lungs daily as needed for wheezing or shortness of breath.   amLODipine 10 MG tablet Commonly known as: NORVASC Take 10 mg by mouth daily.   cephALEXin 500 MG capsule Commonly known as: KEFLEX Take 1 capsule (500 mg total) by mouth 2 (two) times daily for 2 days.   cyanocobalamin 1000 MCG tablet Take 1,000 mcg by mouth daily.   Docusate Sodium 100 MG capsule Take 100 mg by mouth daily.   eucerin cream Apply 1 application topically 2 (two) times daily. Apply to back, upper back, and behind left ear, and upper right chest.   finasteride 5 MG tablet Commonly known as: PROSCAR Take 5 mg by mouth daily.   HYDROcodone-acetaminophen 5-325 MG tablet Commonly known as: NORCO/VICODIN Take 1 tablet by mouth every 6 (six) hours as needed for moderate pain.   HYDROmorphone HCl 1 MG/ML Liqd Commonly known as: DILAUDID Take 1 mL (1 mg total) by mouth every 4 (four) hours as needed for up to 5 days for severe pain.   Iron 325 (65 Fe) MG Tabs Take 325 mg by mouth daily.   levalbuterol 1.25 MG/0.5ML nebulizer solution Commonly known as: XOPENEX Take 1.25 mg by nebulization 3 (three) times daily.   loratadine 10 MG tablet Commonly known as: CLARITIN Take 1 tablet (10 mg total) by mouth daily.   metoprolol tartrate 100 MG tablet Commonly known as: LOPRESSOR Take 100 mg by mouth  2 (two) times daily.   nystatin powder Generic drug: nystatin Apply 1 application topically as needed (groin andn buttocks).   omeprazole 20 MG capsule Commonly known as: PRILOSEC Take 20 mg by mouth at bedtime.   polyethylene glycol 17 g packet Commonly known as: MIRALAX / GLYCOLAX Take 17 g by mouth daily as needed for mild constipation.   PreserVision AREDS 2+Multi Vit Caps Take 1 capsule by mouth 2 (two) times daily.   Refresh Optive 1-0.9 % Gel Generic drug: Carboxymethylcellul-Glycerin Apply 1 drop to eye 4 (four) times daily.   sertraline 25 MG tablet Commonly known as: ZOLOFT Take 25 mg by mouth daily.   tamsulosin 0.4 MG Caps capsule Commonly known as: FLOMAX Take 1 capsule (0.4 mg total) by mouth daily after supper.   tiotropium 18 MCG inhalation capsule Commonly known as: Spiriva HandiHaler Place 1 capsule (18 mcg total) into inhaler and inhale daily.   torsemide 20 MG tablet Commonly known as: DEMADEX Take 80 mg by mouth daily.   traZODone 150 MG tablet Commonly known as: DESYREL Take 0.5 tablets (75 mg total) by mouth at bedtime. What changed:   medication strength  how much to take  Contact information for follow-up providers    Jodi Marble, MD Follow up in 3 week(s).   Specialty: Internal Medicine Contact information: [place] [address] [phone]        [name], MD .   Specialty: Cardiology Contact information: [address] [place] [address] [phone]        Alexis Frock, MD Follow up in 3 week(s).   Specialty: Urology Contact information: [place]  [address] [phone]            Contact information for after-discharge care    Destination    [facility] .   Service: Skilled Nursing Contact information: [address] [place] [phone]                  Allergies  Allergen Reactions  . Cortisone Other (See Comments)    Causes emotional problems     Consultations:  Cardiology  CVTS   Procedures/Studies: CT ABDOMEN PELVIS WO CONTRAST  Result Date: 07/12/2019 CLINICAL DATA:  Bilateral lower quadrant abdominal pain, generalized weakness, emesis EXAM: CT ABDOMEN AND PELVIS WITHOUT CONTRAST TECHNIQUE: Multidetector CT imaging of the abdomen and pelvis was performed following the standard protocol without IV contrast. COMPARISON:  04/05/2013 FINDINGS: Lower chest: Dependent lower lobe consolidation consistent with atelectasis. Trace left pleural effusion. No pneumothorax. Hepatobiliary: Numerous hepatic cysts are unchanged. The gallbladder is grossly normal. Pancreas: Unremarkable. No pancreatic ductal dilatation or surrounding inflammatory changes. Spleen: Normal in size without focal abnormality. Adrenals/Urinary Tract: There is bilateral hydronephrosis and hydroureter, likely due to marked distension of the urinary bladder. Evidence of bladder outlet obstruction with bladder wall thickening and diverticula. There is a nonobstructing 5 mm calculus within the dilated distal right ureter. Multiple right renal cysts are seen. Adrenals are normal. Stomach/Bowel: No bowel obstruction or ileus. Scattered diverticulosis of the distal colon without diverticulitis. No bowel wall thickening or inflammatory change. Vascular/Lymphatic: 7.2 x 6.5 cm aortic aneurysm seen at the level of the diaphragmatic hiatus. There is discontinuity of the mural calcifications, with periaortic fat stranding and high attenuation is seen in the right retrocrural tissues. Appearance is consistent with leaking aneurysm. Evaluation is limited without IV contrast. There is also a 3.9 cm infrarenal abdominal aortic aneurysm. No pathologic adenopathy within the abdomen or pelvis. Reproductive: Prostate is not enlarged. Other: There is no free intraperitoneal fluid or free gas. No  abdominal wall hernia. Musculoskeletal: No acute or destructive bony lesions. Reconstructed images demonstrate no additional findings. IMPRESSION: 1. Leaking 7.2 x 6.5 cm aortic aneurysm at the level of the diaphragmatic hiatus. Surgical consultation recommended. 2. Bladder outlet obstruction, with bilateral hydronephrosis and hydroureter as above. 3. Nonobstructing 5 mm distal right ureteral calculus. 4. 3.9 cm infrarenal abdominal aortic aneurysm without evidence of complication. These results were called by telephone at the time of interpretation on 07/12/2019 at 2:39 am to provider PA Charlann Lange , who verbally acknowledged these results. Electronically Signed   By: Randa Ngo M.D.   On: 07/12/2019 02:40   DG Chest 2 View  Result Date: 07/11/2019 CLINICAL DATA:  Shortness of breath EXAM: CHEST - 2 VIEW COMPARISON:  09/05/2017 FINDINGS: Frontal and lateral views of the chest demonstrate an enlarged cardiac silhouette. There is central vascular congestion with patchy bibasilar consolidation and small bilateral pleural effusions. No acute bony abnormalities. IMPRESSION: 1. Mild congestive heart failure. Electronically Signed   By: Randa Ngo  M.D.   On: 07/11/2019 22:12   ECHOCARDIOGRAM COMPLETE  Result Date: 07/14/2019    ECHOCARDIOGRAM REPORT   Patient Name:   [name] [name] Date of Exam: 07/14/2019 Medical Rec #:  [id number]  Height:       72.0 in Accession #:    [id number] Weight:       199.0 lb Date of Birth:  06-Jun-1925  BSA:          2.126 m Patient Age:    76 years   BP:           137/88 mmHg Patient Gender: M          HR:           83 bpm. Exam Location:  Inpatient Procedure: 2D Echo Indications:    dyspnea 786.09  History:        Patient has prior history of Echocardiogram examinations, most                 recent 09/03/2017. CAD, chronic kidney disease. Abdominal aortic                 aneurysm, Arrythmias:Atrial Fibrillation; Risk                 Factors:Hypertension.  Sonographer:    Johny Chess Referring Phys: [phone] Ingris Pasquarella A Talal Fritchman IMPRESSIONS  1. Left ventricular ejection fraction, by estimation, is 55 to 60%. The left ventricle has normal function. Left ventricular endocardial border not optimally defined to evaluate regional wall motion. There is mild left ventricular hypertrophy. Left ventricular diastolic parameters are indeterminate.  2. Right ventricular systolic function is normal. The right ventricular size is mildly enlarged. Tricuspid regurgitation signal is inadequate for assessing PA pressure.  3. The mitral valve is normal in structure. Trivial mitral valve regurgitation. No evidence of mitral stenosis.  4. The aortic valve is grossly normal. Aortic valve regurgitation is mild to moderate. No aortic stenosis is present.  5. Aortic dilatation noted. There is mild dilatation of the ascending aorta measuring 40 mm.  6. The inferior vena cava is normal in size with greater than 50% respiratory variability, suggesting right atrial pressure of 3 mmHg. FINDINGS  Left Ventricle: Left ventricular ejection fraction, by estimation, is 55 to 60%. The left ventricle has normal function. Left ventricular endocardial border not optimally defined to evaluate regional wall motion. The left ventricular internal cavity size was normal in size. There is mild left ventricular hypertrophy. Left ventricular diastolic parameters are indeterminate. Right Ventricle: The right ventricular size is mildly enlarged. No increase in right ventricular wall thickness. Right ventricular systolic function is normal. Tricuspid regurgitation signal is inadequate for assessing PA pressure. Left Atrium: Left atrial size was normal in size. Right Atrium: Right atrial size was normal in size. Pericardium: Trivial pericardial effusion is present. The pericardial effusion is anterior to the right ventricle. Presence of pericardial fat pad. Mitral Valve: The mitral valve is normal in structure. Normal mobility of the mitral  valve leaflets. Trivial mitral valve regurgitation. No evidence of mitral valve stenosis. Tricuspid Valve: The tricuspid valve is normal in structure. Tricuspid valve regurgitation is trivial. No evidence of tricuspid stenosis. Aortic Valve: Mild-moderate aortic valve regurgitation. Might be underestimated due to eccentric jet and/or multiple jets. The aortic valve is grossly normal. Aortic valve regurgitation is mild to moderate. No aortic stenosis is present. There is mild calcification of the aortic valve. Pulmonic Valve: The pulmonic valve was normal in structure. Pulmonic valve regurgitation is not  visualized. No evidence of pulmonic stenosis. Aorta: Aortic dilatation noted. There is mild dilatation of the ascending aorta measuring 40 mm. Venous: The inferior vena cava is normal in size with greater than 50% respiratory variability, suggesting right atrial pressure of 3 mmHg. IAS/Shunts: No atrial level shunt detected by color flow Doppler.  LEFT VENTRICLE PLAX 2D LVIDd:         4.20 cm LVIDs:         2.90 cm LV PW:         1.10 cm LV IVS:        1.20 cm LVOT diam:     2.10 cm LVOT Area:     3.46 cm  IVC IVC diam: 1.40 cm LEFT ATRIUM             Index       RIGHT ATRIUM          Index LA diam:        2.70 cm 1.27 cm/m  RA Area:     8.51 cm LA Vol (A2C):   31.4 ml 14.77 ml/m RA Volume:   13.90 ml 6.54 ml/m LA Vol (A4C):   24.9 ml 11.71 ml/m LA Biplane Vol: 30.5 ml 14.35 ml/m   AORTA Ao Asc diam: 4.00 cm  SHUNTS Systemic Diam: 2.10 cm Cherlynn Kaiser MD Electronically signed by Cherlynn Kaiser MD Signature Date/Time: 07/14/2019/12:13:45 PM    Final      Subjective: Patient denies abdominal pain, chest pain. He is breathing better.   Discharge Exam: Vitals:   07/14/19 0826 07/14/19 1159  BP: 132/88 (!) 144/86  Pulse: 73 70  Resp: 18 20  Temp: 99 F (37.2 C) 98.4 F (36.9 C)  SpO2: 95% 95%     General: Pt is alert, awake, not in acute distress Cardiovascular: RRR, S1/S2 +, no rubs, no  gallops Respiratory: CTA bilaterally, no wheezing, no rhonchi Abdominal: Soft, NT, ND, bowel sounds + Extremities: no edema, no cyanosis    The results of significant diagnostics from this hospitalization (including imaging, microbiology, ancillary and laboratory) are listed below for reference.     Microbiology: Recent Results (from the past 240 hour(s))  Culture, Urine     Status: None   Collection Time: 07/12/19 12:47 AM   Specimen: Urine, Random  Result Value Ref Range Status   Specimen Description URINE, RANDOM  Final   Special Requests NONE  Final   Culture   Final    NO GROWTH Performed at [facility], [address], [place], [place] [address]    Report Status 07/13/2019 FINAL  Final  SARS Coronavirus 2 by RT PCR (hospital order, performed in [facility] hospital lab) Nasopharyngeal Nasopharyngeal Swab     Status: None   Collection Time: 07/12/19  3:22 AM   Specimen: Nasopharyngeal Swab  Result Value Ref Range Status   SARS Coronavirus 2 NEGATIVE NEGATIVE Final    Comment: (NOTE) SARS-CoV-2 target nucleic acids are NOT DETECTED.  The SARS-CoV-2 RNA is generally detectable in upper and lower respiratory specimens during the acute phase of infection. The lowest concentration of SARS-CoV-2 viral copies this assay can detect is 250 copies / mL. A negative result does not preclude SARS-CoV-2 infection and should not be used as the sole basis for treatment or other patient management decisions.  A negative result may occur with improper specimen collection / handling, submission of specimen other than nasopharyngeal swab, presence of viral mutation(s) within the areas targeted by this assay,  and inadequate number of viral copies (<250 copies / mL). A negative result must be combined with clinical observations, patient history, and epidemiological information.  Fact Sheet for Patients:   StrictlyIdeas.no  Fact Sheet for Healthcare  Providers: BankingDealers.co.za  This test is not yet approved or  cleared by the [place] FDA and has been authorized for detection and/or diagnosis of SARS-CoV-2 by FDA under an Emergency Use Authorization (EUA).  This EUA will remain in effect (meaning this test can be used) for the duration of the COVID-19 declaration under Section 564(b)(1) of the Act, 21 U.S.C. section 360bbb-3(b)(1), unless the authorization is terminated or revoked sooner.  Performed at [facility], [place] [address] [place], [place] [address]      Labs: BNP (last 3 results) Recent Labs    07/11/19 1220 07/11/19 2028  BNP 289.9* 191.4*   Basic Metabolic Panel: Recent Labs  Lab 07/11/19 1220 07/11/19 2002 07/12/19 0523 07/13/19 0424 07/14/19 0254  NA 145* 139  --  141 140  K 4.0 3.4*  --  3.0* 3.7  CL 102 102  --  103 105  CO2 24 21*  --  28 26  GLUCOSE 113* 161*  --  130* 123*  BUN 44* 50*  --  39* 42*  CREATININE 2.52* 2.75*  --  2.00* 2.13*  CALCIUM 9.4 8.9  --  8.9 8.7*  MG  --   --  2.0  --   --    Liver Function Tests: No results for input(s): AST, ALT, ALKPHOS, BILITOT, PROT, ALBUMIN in the last 168 hours. No results for input(s): LIPASE, AMYLASE in the last 168 hours. No results for input(s): AMMONIA in the last 168 hours. CBC: Recent Labs  Lab 07/11/19 1220 07/11/19 2002  WBC 14.3* 13.8*  HGB 12.2* 11.3*  HCT 37.3* 34.4*  MCV 95 95.0  PLT 308 268   Cardiac Enzymes: No results for input(s): CKTOTAL, CKMB, CKMBINDEX, TROPONINI in the last 168 hours. BNP: Invalid input(s): POCBNP CBG: Recent Labs  Lab 07/11/19 2025  GLUCAP 154*   D-Dimer No results for input(s): DDIMER in the last 72 hours. Hgb A1c No results for input(s): HGBA1C in the last 72 hours. Lipid Profile No results for input(s): CHOL, HDL, LDLCALC, TRIG, CHOLHDL, LDLDIRECT in the last 72 hours. Thyroid function studies No results for input(s): TSH, T4TOTAL, T3FREE,  THYROIDAB in the last 72 hours.  Invalid input(s): FREET3 Anemia work up No results for input(s): VITAMINB12, FOLATE, FERRITIN, TIBC, IRON, RETICCTPCT in the last 72 hours. Urinalysis    Component Value Date/Time   COLORURINE YELLOW 07/12/2019 0047   APPEARANCEUR CLOUDY (A) 07/12/2019 0047   LABSPEC 1.008 07/12/2019 0047   PHURINE 6.0 07/12/2019 0047   GLUCOSEU NEGATIVE 07/12/2019 0047   HGBUR SMALL (A) 07/12/2019 0047   BILIRUBINUR NEGATIVE 07/12/2019 0047   KETONESUR NEGATIVE 07/12/2019 0047   PROTEINUR NEGATIVE 07/12/2019 0047   UROBILINOGEN 0.2 11/09/2014 1532   NITRITE NEGATIVE 07/12/2019 0047   LEUKOCYTESUR LARGE (A) 07/12/2019 0047   Sepsis Labs Invalid input(s): PROCALCITONIN,  WBC,  LACTICIDVEN Microbiology Recent Results (from the past 240 hour(s))  Culture, Urine     Status: None   Collection Time: 07/12/19 12:47 AM   Specimen: Urine, Random  Result Value Ref Range Status   Specimen Description URINE, RANDOM  Final   Special Requests NONE  Final   Culture   Final    NO GROWTH Performed at [facility], [place] [address],  [place], [place] [address]    Report Status 07/13/2019 FINAL  Final  SARS Coronavirus 2 by RT PCR (hospital order, performed in [facility] hospital lab) Nasopharyngeal Nasopharyngeal Swab     Status: None   Collection Time: 07/12/19  3:22 AM   Specimen: Nasopharyngeal Swab  Result Value Ref Range Status   SARS Coronavirus 2 NEGATIVE NEGATIVE Final    Comment: (NOTE) SARS-CoV-2 target nucleic acids are NOT DETECTED.  The SARS-CoV-2 RNA is generally detectable in upper and lower respiratory specimens during the acute phase of infection. The lowest concentration of SARS-CoV-2 viral copies this assay can detect is 250 copies / mL. A negative result does not preclude SARS-CoV-2 infection and should not be used as the sole basis for treatment or other patient management decisions.  A negative result may occur with improper specimen  collection / handling, submission of specimen other than nasopharyngeal swab, presence of viral mutation(s) within the areas targeted by this assay, and inadequate number of viral copies (<250 copies / mL). A negative result must be combined with clinical observations, patient history, and epidemiological information.  Fact Sheet for Patients:   StrictlyIdeas.no  Fact Sheet for Healthcare Providers: BankingDealers.co.za  This test is not yet approved or  cleared by the [place] FDA and has been authorized for detection and/or diagnosis of SARS-CoV-2 by FDA under an Emergency Use Authorization (EUA).  This EUA will remain in effect (meaning this test can be used) for the duration of the COVID-19 declaration under Section 564(b)(1) of the Act, 21 U.S.C. section 360bbb-3(b)(1), unless the authorization is terminated or revoked sooner.  Performed at [facility], [place] [address], [place], [place] [address]      Time coordinating discharge: 40 minutes  SIGNED:   Elmarie Shiley, MD  [facility]

## 2019-07-14 NOTE — Progress Notes (Signed)
[facility]. [name] [name]

## 2019-07-14 NOTE — NC FL2 (Signed)
[place] LEVEL OF CARE SCREENING TOOL     IDENTIFICATION  Patient Name: [name] [name] Birthdate: 05/05/1925 Sex: male Admission Date (Current Location): 07/11/2019  [facility] and [place] Number:  Herbalist and Address:  [facility], [place] [address] [place], [place] [address]      Provider Number: [id number]  Attending Physician Name and Address:  Elmarie Shiley, MD  Relative Name and Phone Number:  [name], daughter, [phone]    Current Level of Care: Hospital Recommended Level of Care: [place] Prior Approval Number:    Date Approved/Denied:   PASRR Number: [id number]  Discharge Plan: SNF    Current Diagnoses: Patient Active Problem List   Diagnosis Date Noted  . Obstructive uropathy   . Leaking thoracic aortic aneurysm (TAA) ([place]) 07/12/2019  . CKD (chronic kidney disease), stage IV ([place]) 07/12/2019  . Coronary artery disease of native artery of native heart with stable angina pectoris ([place]) 11/25/2017  . Paroxysmal atrial fibrillation (HCC)   . Pulmonary edema 09/02/2017  . Respiratory failure ([place]) 09/02/2017  . Major neurocognitive disorder due to another medical condition with behavioral disturbance ([place]) 07/31/2017  . Long term current use of anticoagulant 07/30/2017  . COPD (chronic obstructive pulmonary disease) ([place]) 07/30/2017  . Acute encephalopathy 07/29/2017  . Pressure injury of skin 07/13/2017  . Atrial fibrillation with RVR ([place]) 07/09/2017  . Chronic diastolic heart failure ([place]) 07/09/2017  . AAA (abdominal aortic aneurysm) ([place]) 07/09/2017  . Acute on chronic diastolic congestive heart failure ([place]) 04/11/2017  . Atrial fibrillation, chronic ([place]) 04/11/2017  . Acute respiratory failure with hypoxia ([place]) 02/23/2017  . Nausea & vomiting 02/23/2017  . PTSD (post-traumatic stress disorder)   . Vomiting and diarrhea 11/09/2014  . Unstable angina ([place]) 05/10/2014  . CAD S/P  percutaneous coronary angioplasty   . Angina pectoris, crescendo ([place]) 04/28/2014  . Contusion of left hip 07/04/2013  . Fall 07/04/2013  . Benign prostatic hyperplasia 05/31/2013  . UTI (urinary tract infection) 05/31/2013  . Hypercholesterolemia 05/31/2013  . Dysphagia, pharyngoesophageal phase 04/10/2013  . Frequent falls 04/10/2013  . Weakness 04/10/2013  . Hyperthyroidism, subclinical 04/10/2013  . GERD (gastroesophageal reflux disease) 04/06/2013  . Bladder outlet obstruction 04/06/2013  . Dehydration 04/05/2013  . Essential hypertension 04/05/2013  . Arthritis 04/05/2013  . Peripheral neuropathy 04/05/2013  . Gastroenteritis, acute 04/05/2013  . Gastroenteritis 04/05/2013    Orientation RESPIRATION BLADDER Height & Weight     Self, Situation, Place  Normal Incontinent, Indwelling catheter Weight: 199 lb (90.3 kg) Height:  6' (182.9 cm)  BEHAVIORAL SYMPTOMS/MOOD NEUROLOGICAL BOWEL NUTRITION STATUS      Incontinent Diet (Please see DC Summary)  AMBULATORY STATUS COMMUNICATION OF NEEDS Skin   Extensive Assist Verbally Normal                       Personal Care Assistance Level of Assistance  Bathing, Feeding, Dressing Bathing Assistance: Limited assistance Feeding assistance: Limited assistance Dressing Assistance: Limited assistance     Functional Limitations Info  Sight, Hearing, Speech Sight Info: Impaired Hearing Info: Impaired Speech Info: Adequate    SPECIAL CARE FACTORS FREQUENCY  Speech therapy             Speech Therapy Frequency: 2x/week      Contractures Contractures Info: Not present    Additional Factors Info  Code Status, Allergies, Psychotropic Code Status Info: DNR Allergies Info: Cortisone Psychotropic Info: Trazodone  Current Medications (07/14/2019):  This is the current hospital active medication list Current Facility-Administered Medications  Medication Dose Route Frequency Provider Last Rate Last Admin  . 0.9 %   sodium chloride infusion  250 mL Intravenous PRN Opyd, Ilene Qua, MD      . acetaminophen (TYLENOL) tablet 650 mg  650 mg Oral Q4H PRN Opyd, Ilene Qua, MD      . albuterol (PROVENTIL) (2.5 MG/3ML) 0.083% nebulizer solution 3 mL  3 mL Inhalation Q4H PRN Opyd, Ilene Qua, MD   3 mL at 07/12/19 1221  . amLODipine (NORVASC) tablet 10 mg  10 mg Oral Daily Regalado, Belkys A, MD   10 mg at 07/14/19 1059  . cefTRIAXone (ROCEPHIN) 1 g in sodium chloride 0.9 % 100 mL IVPB  1 g Intravenous Q24H Regalado, Belkys A, MD 200 mL/hr at 07/13/19 0927 1 g at 07/13/19 0927  . Chlorhexidine Gluconate Cloth 2 % PADS 6 each  6 each Topical Daily Regalado, Belkys A, MD   6 each at 07/14/19 1000  . finasteride (PROSCAR) tablet 5 mg  5 mg Oral Daily Opyd, Ilene Qua, MD   5 mg at 07/14/19 1059  . HYDROmorphone (DILAUDID) injection 0.5 mg  0.5 mg Intravenous Q3H PRN Regalado, Belkys A, MD      . labetalol (NORMODYNE) injection 5 mg  5 mg Intravenous Q4H PRN Regalado, Belkys A, MD   5 mg at 07/12/19 1736  . metoprolol tartrate (LOPRESSOR) tablet 100 mg  100 mg Oral BID Opyd, Ilene Qua, MD   100 mg at 07/14/19 1059  . ondansetron (ZOFRAN) injection 4 mg  4 mg Intravenous Q6H PRN Opyd, Ilene Qua, MD      . polyethylene glycol (MIRALAX / GLYCOLAX) packet 17 g  17 g Oral Daily Regalado, Belkys A, MD   17 g at 07/13/19 1004  . Resource ThickenUp Clear   Oral PRN Regalado, Belkys A, MD      . senna-docusate (Senokot-S) tablet 1 tablet  1 tablet Oral BID Regalado, Belkys A, MD   1 tablet at 07/14/19 1059  . sertraline (ZOLOFT) tablet 25 mg  25 mg Oral Daily Regalado, Belkys A, MD      . sodium chloride flush (NS) 0.9 % injection 3 mL  3 mL Intravenous Once Opyd, Timothy S, MD      . sodium chloride flush (NS) 0.9 % injection 3 mL  3 mL Intravenous Q12H Opyd, Ilene Qua, MD   3 mL at 07/14/19 1114  . sodium chloride flush (NS) 0.9 % injection 3 mL  3 mL Intravenous PRN Opyd, Ilene Qua, MD      . tamsulosin (FLOMAX) capsule 0.4 mg  0.4  mg Oral QPC supper Opyd, Ilene Qua, MD   0.4 mg at 07/13/19 1830  . traZODone (DESYREL) tablet 75 mg  75 mg Oral QHS Regalado, Belkys A, MD      . umeclidinium bromide (INCRUSE ELLIPTA) 62.5 MCG/INH 1 puff  1 puff Inhalation Daily Opyd, Ilene Qua, MD   1 puff at 07/12/19 1214     Discharge Medications: Please see discharge summary for a list of discharge medications.  Relevant Imaging Results:  Relevant Lab Results:   Additional Information SN: [id number]  Benard Halsted, LCSW

## 2019-07-14 NOTE — Progress Notes (Signed)
Report called to [facility]. Ellamae Sia

## 2019-07-14 NOTE — Progress Notes (Signed)
Manufacturing engineer [facility]) hospital liaison note  Family meeting held with pt's three daughters. [place] has been approved my ACC MD as hospice eligible. All daughters in agreement for hospice to support at [facility].  All in agreement to support patient's desire to pass there at [facility] when the time comes and not to have any more hospitalizations.  Dr. Tyrell Antonio made aware; new orders for severe pain/SOB received for outpt use. Plan made to admit to hospice tomorrow afternoon.  Please discharge home with DNR form.   Thank you for the opportunity to participate in this patient's care.  Chrislyn Edison Pace, BSN. RN [facility] Liaison [phone] (24h on call)

## 2019-07-14 NOTE — Progress Notes (Signed)
  Echocardiogram 2D Echocardiogram has been performed.  [name] [name] 07/14/2019, 10:30 AM

## 2019-07-15 DIAGNOSIS — K219 Gastro-esophageal reflux disease without esophagitis: Secondary | ICD-10-CM | POA: Diagnosis not present

## 2019-07-15 DIAGNOSIS — H6120 Impacted cerumen, unspecified ear: Secondary | ICD-10-CM | POA: Diagnosis not present

## 2019-07-15 DIAGNOSIS — G629 Polyneuropathy, unspecified: Secondary | ICD-10-CM | POA: Diagnosis not present

## 2019-07-15 DIAGNOSIS — H3530 Unspecified macular degeneration: Secondary | ICD-10-CM | POA: Diagnosis not present

## 2019-07-15 DIAGNOSIS — J302 Other seasonal allergic rhinitis: Secondary | ICD-10-CM | POA: Diagnosis not present

## 2019-07-15 DIAGNOSIS — Z6827 Body mass index (BMI) 27.0-27.9, adult: Secondary | ICD-10-CM | POA: Diagnosis not present

## 2019-07-15 DIAGNOSIS — N138 Other obstructive and reflux uropathy: Secondary | ICD-10-CM | POA: Diagnosis not present

## 2019-07-15 DIAGNOSIS — Z8616 Personal history of COVID-19: Secondary | ICD-10-CM | POA: Diagnosis not present

## 2019-07-15 DIAGNOSIS — F4310 Post-traumatic stress disorder, unspecified: Secondary | ICD-10-CM | POA: Diagnosis not present

## 2019-07-15 DIAGNOSIS — R1310 Dysphagia, unspecified: Secondary | ICD-10-CM | POA: Diagnosis not present

## 2019-07-15 DIAGNOSIS — D631 Anemia in chronic kidney disease: Secondary | ICD-10-CM | POA: Diagnosis not present

## 2019-07-15 DIAGNOSIS — N184 Chronic kidney disease, stage 4 (severe): Secondary | ICD-10-CM | POA: Diagnosis not present

## 2019-07-15 DIAGNOSIS — J449 Chronic obstructive pulmonary disease, unspecified: Secondary | ICD-10-CM | POA: Diagnosis not present

## 2019-07-15 DIAGNOSIS — I130 Hypertensive heart and chronic kidney disease with heart failure and stage 1 through stage 4 chronic kidney disease, or unspecified chronic kidney disease: Secondary | ICD-10-CM | POA: Diagnosis not present

## 2019-07-15 DIAGNOSIS — Z741 Need for assistance with personal care: Secondary | ICD-10-CM | POA: Diagnosis not present

## 2019-07-15 DIAGNOSIS — I711 Thoracic aortic aneurysm, ruptured: Secondary | ICD-10-CM | POA: Diagnosis not present

## 2019-07-15 DIAGNOSIS — I2510 Atherosclerotic heart disease of native coronary artery without angina pectoris: Secondary | ICD-10-CM | POA: Diagnosis not present

## 2019-07-15 DIAGNOSIS — I4891 Unspecified atrial fibrillation: Secondary | ICD-10-CM | POA: Diagnosis not present

## 2019-07-15 DIAGNOSIS — F329 Major depressive disorder, single episode, unspecified: Secondary | ICD-10-CM | POA: Diagnosis not present

## 2019-07-15 DIAGNOSIS — L853 Xerosis cutis: Secondary | ICD-10-CM | POA: Diagnosis not present

## 2019-07-15 DIAGNOSIS — I5030 Unspecified diastolic (congestive) heart failure: Secondary | ICD-10-CM | POA: Diagnosis not present

## 2019-07-15 DIAGNOSIS — N401 Enlarged prostate with lower urinary tract symptoms: Secondary | ICD-10-CM | POA: Diagnosis not present

## 2019-07-15 DIAGNOSIS — F29 Unspecified psychosis not due to a substance or known physiological condition: Secondary | ICD-10-CM | POA: Diagnosis not present

## 2019-07-17 DIAGNOSIS — I711 Thoracic aortic aneurysm, ruptured: Secondary | ICD-10-CM | POA: Diagnosis not present

## 2019-07-17 DIAGNOSIS — I130 Hypertensive heart and chronic kidney disease with heart failure and stage 1 through stage 4 chronic kidney disease, or unspecified chronic kidney disease: Secondary | ICD-10-CM | POA: Diagnosis not present

## 2019-07-17 DIAGNOSIS — I2510 Atherosclerotic heart disease of native coronary artery without angina pectoris: Secondary | ICD-10-CM | POA: Diagnosis not present

## 2019-07-17 DIAGNOSIS — J449 Chronic obstructive pulmonary disease, unspecified: Secondary | ICD-10-CM | POA: Diagnosis not present

## 2019-07-17 DIAGNOSIS — I5030 Unspecified diastolic (congestive) heart failure: Secondary | ICD-10-CM | POA: Diagnosis not present

## 2019-07-17 DIAGNOSIS — I4891 Unspecified atrial fibrillation: Secondary | ICD-10-CM | POA: Diagnosis not present

## 2019-07-21 DIAGNOSIS — I5030 Unspecified diastolic (congestive) heart failure: Secondary | ICD-10-CM | POA: Diagnosis not present

## 2019-07-21 DIAGNOSIS — I2510 Atherosclerotic heart disease of native coronary artery without angina pectoris: Secondary | ICD-10-CM | POA: Diagnosis not present

## 2019-07-21 DIAGNOSIS — J449 Chronic obstructive pulmonary disease, unspecified: Secondary | ICD-10-CM | POA: Diagnosis not present

## 2019-07-21 DIAGNOSIS — I130 Hypertensive heart and chronic kidney disease with heart failure and stage 1 through stage 4 chronic kidney disease, or unspecified chronic kidney disease: Secondary | ICD-10-CM | POA: Diagnosis not present

## 2019-07-21 DIAGNOSIS — I711 Thoracic aortic aneurysm, ruptured: Secondary | ICD-10-CM | POA: Diagnosis not present

## 2019-07-21 DIAGNOSIS — I4891 Unspecified atrial fibrillation: Secondary | ICD-10-CM | POA: Diagnosis not present

## 2019-07-23 DIAGNOSIS — I5030 Unspecified diastolic (congestive) heart failure: Secondary | ICD-10-CM | POA: Diagnosis not present

## 2019-07-23 DIAGNOSIS — J449 Chronic obstructive pulmonary disease, unspecified: Secondary | ICD-10-CM | POA: Diagnosis not present

## 2019-07-23 DIAGNOSIS — I4891 Unspecified atrial fibrillation: Secondary | ICD-10-CM | POA: Diagnosis not present

## 2019-07-23 DIAGNOSIS — I130 Hypertensive heart and chronic kidney disease with heart failure and stage 1 through stage 4 chronic kidney disease, or unspecified chronic kidney disease: Secondary | ICD-10-CM | POA: Diagnosis not present

## 2019-07-23 DIAGNOSIS — I711 Thoracic aortic aneurysm, ruptured: Secondary | ICD-10-CM | POA: Diagnosis not present

## 2019-07-23 DIAGNOSIS — I2510 Atherosclerotic heart disease of native coronary artery without angina pectoris: Secondary | ICD-10-CM | POA: Diagnosis not present

## 2019-07-24 DIAGNOSIS — I2510 Atherosclerotic heart disease of native coronary artery without angina pectoris: Secondary | ICD-10-CM | POA: Diagnosis not present

## 2019-07-24 DIAGNOSIS — I130 Hypertensive heart and chronic kidney disease with heart failure and stage 1 through stage 4 chronic kidney disease, or unspecified chronic kidney disease: Secondary | ICD-10-CM | POA: Diagnosis not present

## 2019-07-24 DIAGNOSIS — I5030 Unspecified diastolic (congestive) heart failure: Secondary | ICD-10-CM | POA: Diagnosis not present

## 2019-07-24 DIAGNOSIS — J449 Chronic obstructive pulmonary disease, unspecified: Secondary | ICD-10-CM | POA: Diagnosis not present

## 2019-07-24 DIAGNOSIS — I711 Thoracic aortic aneurysm, ruptured: Secondary | ICD-10-CM | POA: Diagnosis not present

## 2019-07-24 DIAGNOSIS — I4891 Unspecified atrial fibrillation: Secondary | ICD-10-CM | POA: Diagnosis not present

## 2019-07-26 DIAGNOSIS — J449 Chronic obstructive pulmonary disease, unspecified: Secondary | ICD-10-CM | POA: Diagnosis not present

## 2019-07-26 DIAGNOSIS — I711 Thoracic aortic aneurysm, ruptured: Secondary | ICD-10-CM | POA: Diagnosis not present

## 2019-07-26 DIAGNOSIS — I2510 Atherosclerotic heart disease of native coronary artery without angina pectoris: Secondary | ICD-10-CM | POA: Diagnosis not present

## 2019-07-26 DIAGNOSIS — I130 Hypertensive heart and chronic kidney disease with heart failure and stage 1 through stage 4 chronic kidney disease, or unspecified chronic kidney disease: Secondary | ICD-10-CM | POA: Diagnosis not present

## 2019-07-26 DIAGNOSIS — I5030 Unspecified diastolic (congestive) heart failure: Secondary | ICD-10-CM | POA: Diagnosis not present

## 2019-07-26 DIAGNOSIS — I4891 Unspecified atrial fibrillation: Secondary | ICD-10-CM | POA: Diagnosis not present

## 2019-07-28 DIAGNOSIS — J449 Chronic obstructive pulmonary disease, unspecified: Secondary | ICD-10-CM | POA: Diagnosis not present

## 2019-07-28 DIAGNOSIS — I4891 Unspecified atrial fibrillation: Secondary | ICD-10-CM | POA: Diagnosis not present

## 2019-07-28 DIAGNOSIS — I2510 Atherosclerotic heart disease of native coronary artery without angina pectoris: Secondary | ICD-10-CM | POA: Diagnosis not present

## 2019-07-28 DIAGNOSIS — I711 Thoracic aortic aneurysm, ruptured: Secondary | ICD-10-CM | POA: Diagnosis not present

## 2019-07-28 DIAGNOSIS — I130 Hypertensive heart and chronic kidney disease with heart failure and stage 1 through stage 4 chronic kidney disease, or unspecified chronic kidney disease: Secondary | ICD-10-CM | POA: Diagnosis not present

## 2019-07-28 DIAGNOSIS — I5030 Unspecified diastolic (congestive) heart failure: Secondary | ICD-10-CM | POA: Diagnosis not present

## 2019-07-30 DIAGNOSIS — I130 Hypertensive heart and chronic kidney disease with heart failure and stage 1 through stage 4 chronic kidney disease, or unspecified chronic kidney disease: Secondary | ICD-10-CM | POA: Diagnosis not present

## 2019-07-30 DIAGNOSIS — I4891 Unspecified atrial fibrillation: Secondary | ICD-10-CM | POA: Diagnosis not present

## 2019-07-30 DIAGNOSIS — I5030 Unspecified diastolic (congestive) heart failure: Secondary | ICD-10-CM | POA: Diagnosis not present

## 2019-07-30 DIAGNOSIS — I711 Thoracic aortic aneurysm, ruptured: Secondary | ICD-10-CM | POA: Diagnosis not present

## 2019-07-30 DIAGNOSIS — I2510 Atherosclerotic heart disease of native coronary artery without angina pectoris: Secondary | ICD-10-CM | POA: Diagnosis not present

## 2019-07-30 DIAGNOSIS — J449 Chronic obstructive pulmonary disease, unspecified: Secondary | ICD-10-CM | POA: Diagnosis not present

## 2019-08-02 DIAGNOSIS — I130 Hypertensive heart and chronic kidney disease with heart failure and stage 1 through stage 4 chronic kidney disease, or unspecified chronic kidney disease: Secondary | ICD-10-CM | POA: Diagnosis not present

## 2019-08-02 DIAGNOSIS — I711 Thoracic aortic aneurysm, ruptured: Secondary | ICD-10-CM | POA: Diagnosis not present

## 2019-08-02 DIAGNOSIS — I5030 Unspecified diastolic (congestive) heart failure: Secondary | ICD-10-CM | POA: Diagnosis not present

## 2019-08-02 DIAGNOSIS — J449 Chronic obstructive pulmonary disease, unspecified: Secondary | ICD-10-CM | POA: Diagnosis not present

## 2019-08-02 DIAGNOSIS — I2510 Atherosclerotic heart disease of native coronary artery without angina pectoris: Secondary | ICD-10-CM | POA: Diagnosis not present

## 2019-08-02 DIAGNOSIS — I4891 Unspecified atrial fibrillation: Secondary | ICD-10-CM | POA: Diagnosis not present

## 2019-08-03 DIAGNOSIS — I4891 Unspecified atrial fibrillation: Secondary | ICD-10-CM | POA: Diagnosis not present

## 2019-08-03 DIAGNOSIS — I5030 Unspecified diastolic (congestive) heart failure: Secondary | ICD-10-CM | POA: Diagnosis not present

## 2019-08-03 DIAGNOSIS — J449 Chronic obstructive pulmonary disease, unspecified: Secondary | ICD-10-CM | POA: Diagnosis not present

## 2019-08-03 DIAGNOSIS — I130 Hypertensive heart and chronic kidney disease with heart failure and stage 1 through stage 4 chronic kidney disease, or unspecified chronic kidney disease: Secondary | ICD-10-CM | POA: Diagnosis not present

## 2019-08-03 DIAGNOSIS — I2510 Atherosclerotic heart disease of native coronary artery without angina pectoris: Secondary | ICD-10-CM | POA: Diagnosis not present

## 2019-08-03 DIAGNOSIS — I711 Thoracic aortic aneurysm, ruptured: Secondary | ICD-10-CM | POA: Diagnosis not present

## 2019-08-06 DIAGNOSIS — I130 Hypertensive heart and chronic kidney disease with heart failure and stage 1 through stage 4 chronic kidney disease, or unspecified chronic kidney disease: Secondary | ICD-10-CM | POA: Diagnosis not present

## 2019-08-06 DIAGNOSIS — I2510 Atherosclerotic heart disease of native coronary artery without angina pectoris: Secondary | ICD-10-CM | POA: Diagnosis not present

## 2019-08-06 DIAGNOSIS — I5030 Unspecified diastolic (congestive) heart failure: Secondary | ICD-10-CM | POA: Diagnosis not present

## 2019-08-06 DIAGNOSIS — I4891 Unspecified atrial fibrillation: Secondary | ICD-10-CM | POA: Diagnosis not present

## 2019-08-06 DIAGNOSIS — I711 Thoracic aortic aneurysm, ruptured: Secondary | ICD-10-CM | POA: Diagnosis not present

## 2019-08-06 DIAGNOSIS — J449 Chronic obstructive pulmonary disease, unspecified: Secondary | ICD-10-CM | POA: Diagnosis not present

## 2019-08-09 DIAGNOSIS — I2510 Atherosclerotic heart disease of native coronary artery without angina pectoris: Secondary | ICD-10-CM | POA: Diagnosis not present

## 2019-08-09 DIAGNOSIS — I5030 Unspecified diastolic (congestive) heart failure: Secondary | ICD-10-CM | POA: Diagnosis not present

## 2019-08-09 DIAGNOSIS — J449 Chronic obstructive pulmonary disease, unspecified: Secondary | ICD-10-CM | POA: Diagnosis not present

## 2019-08-09 DIAGNOSIS — I130 Hypertensive heart and chronic kidney disease with heart failure and stage 1 through stage 4 chronic kidney disease, or unspecified chronic kidney disease: Secondary | ICD-10-CM | POA: Diagnosis not present

## 2019-08-09 DIAGNOSIS — I4891 Unspecified atrial fibrillation: Secondary | ICD-10-CM | POA: Diagnosis not present

## 2019-08-09 DIAGNOSIS — I711 Thoracic aortic aneurysm, ruptured: Secondary | ICD-10-CM | POA: Diagnosis not present

## 2019-08-11 DIAGNOSIS — I130 Hypertensive heart and chronic kidney disease with heart failure and stage 1 through stage 4 chronic kidney disease, or unspecified chronic kidney disease: Secondary | ICD-10-CM | POA: Diagnosis not present

## 2019-08-12 DIAGNOSIS — I5030 Unspecified diastolic (congestive) heart failure: Secondary | ICD-10-CM | POA: Diagnosis not present

## 2019-08-12 DIAGNOSIS — I5032 Chronic diastolic (congestive) heart failure: Secondary | ICD-10-CM | POA: Diagnosis not present

## 2019-08-12 DIAGNOSIS — F0390 Unspecified dementia without behavioral disturbance: Secondary | ICD-10-CM | POA: Diagnosis not present

## 2019-08-12 DIAGNOSIS — I480 Paroxysmal atrial fibrillation: Secondary | ICD-10-CM | POA: Diagnosis not present

## 2019-08-12 DIAGNOSIS — I2510 Atherosclerotic heart disease of native coronary artery without angina pectoris: Secondary | ICD-10-CM | POA: Diagnosis not present

## 2019-08-12 DIAGNOSIS — E782 Mixed hyperlipidemia: Secondary | ICD-10-CM | POA: Diagnosis not present

## 2019-08-12 DIAGNOSIS — J449 Chronic obstructive pulmonary disease, unspecified: Secondary | ICD-10-CM | POA: Diagnosis not present

## 2019-08-12 DIAGNOSIS — N400 Enlarged prostate without lower urinary tract symptoms: Secondary | ICD-10-CM | POA: Diagnosis not present

## 2019-08-12 DIAGNOSIS — F329 Major depressive disorder, single episode, unspecified: Secondary | ICD-10-CM | POA: Diagnosis not present

## 2019-08-12 DIAGNOSIS — I509 Heart failure, unspecified: Secondary | ICD-10-CM | POA: Diagnosis not present

## 2019-08-12 DIAGNOSIS — I10 Essential (primary) hypertension: Secondary | ICD-10-CM | POA: Diagnosis not present

## 2019-08-12 DIAGNOSIS — E559 Vitamin D deficiency, unspecified: Secondary | ICD-10-CM | POA: Diagnosis not present

## 2019-08-13 DIAGNOSIS — F329 Major depressive disorder, single episode, unspecified: Secondary | ICD-10-CM | POA: Diagnosis not present

## 2019-08-13 DIAGNOSIS — N401 Enlarged prostate with lower urinary tract symptoms: Secondary | ICD-10-CM | POA: Diagnosis not present

## 2019-08-13 DIAGNOSIS — N184 Chronic kidney disease, stage 4 (severe): Secondary | ICD-10-CM | POA: Diagnosis not present

## 2019-08-13 DIAGNOSIS — F29 Unspecified psychosis not due to a substance or known physiological condition: Secondary | ICD-10-CM | POA: Diagnosis not present

## 2019-08-13 DIAGNOSIS — N138 Other obstructive and reflux uropathy: Secondary | ICD-10-CM | POA: Diagnosis not present

## 2019-08-13 DIAGNOSIS — I4891 Unspecified atrial fibrillation: Secondary | ICD-10-CM | POA: Diagnosis not present

## 2019-08-13 DIAGNOSIS — D631 Anemia in chronic kidney disease: Secondary | ICD-10-CM | POA: Diagnosis not present

## 2019-08-13 DIAGNOSIS — G629 Polyneuropathy, unspecified: Secondary | ICD-10-CM | POA: Diagnosis not present

## 2019-08-13 DIAGNOSIS — I130 Hypertensive heart and chronic kidney disease with heart failure and stage 1 through stage 4 chronic kidney disease, or unspecified chronic kidney disease: Secondary | ICD-10-CM | POA: Diagnosis not present

## 2019-08-13 DIAGNOSIS — K219 Gastro-esophageal reflux disease without esophagitis: Secondary | ICD-10-CM | POA: Diagnosis not present

## 2019-08-13 DIAGNOSIS — Z6827 Body mass index (BMI) 27.0-27.9, adult: Secondary | ICD-10-CM | POA: Diagnosis not present

## 2019-08-13 DIAGNOSIS — I2510 Atherosclerotic heart disease of native coronary artery without angina pectoris: Secondary | ICD-10-CM | POA: Diagnosis not present

## 2019-08-13 DIAGNOSIS — I711 Thoracic aortic aneurysm, ruptured: Secondary | ICD-10-CM | POA: Diagnosis not present

## 2019-08-13 DIAGNOSIS — H3530 Unspecified macular degeneration: Secondary | ICD-10-CM | POA: Diagnosis not present

## 2019-08-13 DIAGNOSIS — R1310 Dysphagia, unspecified: Secondary | ICD-10-CM | POA: Diagnosis not present

## 2019-08-13 DIAGNOSIS — Z8616 Personal history of COVID-19: Secondary | ICD-10-CM | POA: Diagnosis not present

## 2019-08-13 DIAGNOSIS — I5030 Unspecified diastolic (congestive) heart failure: Secondary | ICD-10-CM | POA: Diagnosis not present

## 2019-08-13 DIAGNOSIS — L853 Xerosis cutis: Secondary | ICD-10-CM | POA: Diagnosis not present

## 2019-08-13 DIAGNOSIS — H6120 Impacted cerumen, unspecified ear: Secondary | ICD-10-CM | POA: Diagnosis not present

## 2019-08-13 DIAGNOSIS — J449 Chronic obstructive pulmonary disease, unspecified: Secondary | ICD-10-CM | POA: Diagnosis not present

## 2019-08-13 DIAGNOSIS — J302 Other seasonal allergic rhinitis: Secondary | ICD-10-CM | POA: Diagnosis not present

## 2019-08-13 DIAGNOSIS — Z741 Need for assistance with personal care: Secondary | ICD-10-CM | POA: Diagnosis not present

## 2019-08-13 DIAGNOSIS — F4310 Post-traumatic stress disorder, unspecified: Secondary | ICD-10-CM | POA: Diagnosis not present

## 2019-08-14 DIAGNOSIS — I5030 Unspecified diastolic (congestive) heart failure: Secondary | ICD-10-CM | POA: Diagnosis not present

## 2019-08-14 DIAGNOSIS — I4891 Unspecified atrial fibrillation: Secondary | ICD-10-CM | POA: Diagnosis not present

## 2019-08-14 DIAGNOSIS — J449 Chronic obstructive pulmonary disease, unspecified: Secondary | ICD-10-CM | POA: Diagnosis not present

## 2019-08-14 DIAGNOSIS — I2510 Atherosclerotic heart disease of native coronary artery without angina pectoris: Secondary | ICD-10-CM | POA: Diagnosis not present

## 2019-08-14 DIAGNOSIS — I130 Hypertensive heart and chronic kidney disease with heart failure and stage 1 through stage 4 chronic kidney disease, or unspecified chronic kidney disease: Secondary | ICD-10-CM | POA: Diagnosis not present

## 2019-08-14 DIAGNOSIS — I711 Thoracic aortic aneurysm, ruptured: Secondary | ICD-10-CM | POA: Diagnosis not present

## 2019-08-16 DIAGNOSIS — F329 Major depressive disorder, single episode, unspecified: Secondary | ICD-10-CM | POA: Diagnosis not present

## 2019-08-16 DIAGNOSIS — I480 Paroxysmal atrial fibrillation: Secondary | ICD-10-CM | POA: Diagnosis not present

## 2019-08-16 DIAGNOSIS — I5030 Unspecified diastolic (congestive) heart failure: Secondary | ICD-10-CM | POA: Diagnosis not present

## 2019-08-16 DIAGNOSIS — I5032 Chronic diastolic (congestive) heart failure: Secondary | ICD-10-CM | POA: Diagnosis not present

## 2019-08-16 DIAGNOSIS — I711 Thoracic aortic aneurysm, ruptured: Secondary | ICD-10-CM | POA: Diagnosis not present

## 2019-08-16 DIAGNOSIS — I10 Essential (primary) hypertension: Secondary | ICD-10-CM | POA: Diagnosis not present

## 2019-08-16 DIAGNOSIS — I4891 Unspecified atrial fibrillation: Secondary | ICD-10-CM | POA: Diagnosis not present

## 2019-08-16 DIAGNOSIS — J449 Chronic obstructive pulmonary disease, unspecified: Secondary | ICD-10-CM | POA: Diagnosis not present

## 2019-08-16 DIAGNOSIS — I2510 Atherosclerotic heart disease of native coronary artery without angina pectoris: Secondary | ICD-10-CM | POA: Diagnosis not present

## 2019-08-16 DIAGNOSIS — I130 Hypertensive heart and chronic kidney disease with heart failure and stage 1 through stage 4 chronic kidney disease, or unspecified chronic kidney disease: Secondary | ICD-10-CM | POA: Diagnosis not present

## 2019-08-16 DIAGNOSIS — N184 Chronic kidney disease, stage 4 (severe): Secondary | ICD-10-CM | POA: Diagnosis not present

## 2019-08-17 DIAGNOSIS — I5032 Chronic diastolic (congestive) heart failure: Secondary | ICD-10-CM | POA: Diagnosis not present

## 2019-08-17 DIAGNOSIS — N184 Chronic kidney disease, stage 4 (severe): Secondary | ICD-10-CM | POA: Diagnosis not present

## 2019-08-17 DIAGNOSIS — N400 Enlarged prostate without lower urinary tract symptoms: Secondary | ICD-10-CM | POA: Diagnosis not present

## 2019-08-17 DIAGNOSIS — K219 Gastro-esophageal reflux disease without esophagitis: Secondary | ICD-10-CM | POA: Diagnosis not present

## 2019-08-17 DIAGNOSIS — G4700 Insomnia, unspecified: Secondary | ICD-10-CM | POA: Diagnosis not present

## 2019-08-20 DIAGNOSIS — I711 Thoracic aortic aneurysm, ruptured: Secondary | ICD-10-CM | POA: Diagnosis not present

## 2019-08-20 DIAGNOSIS — I4891 Unspecified atrial fibrillation: Secondary | ICD-10-CM | POA: Diagnosis not present

## 2019-08-20 DIAGNOSIS — J449 Chronic obstructive pulmonary disease, unspecified: Secondary | ICD-10-CM | POA: Diagnosis not present

## 2019-08-20 DIAGNOSIS — I2510 Atherosclerotic heart disease of native coronary artery without angina pectoris: Secondary | ICD-10-CM | POA: Diagnosis not present

## 2019-08-20 DIAGNOSIS — I130 Hypertensive heart and chronic kidney disease with heart failure and stage 1 through stage 4 chronic kidney disease, or unspecified chronic kidney disease: Secondary | ICD-10-CM | POA: Diagnosis not present

## 2019-08-20 DIAGNOSIS — I5030 Unspecified diastolic (congestive) heart failure: Secondary | ICD-10-CM | POA: Diagnosis not present

## 2019-08-23 DIAGNOSIS — I130 Hypertensive heart and chronic kidney disease with heart failure and stage 1 through stage 4 chronic kidney disease, or unspecified chronic kidney disease: Secondary | ICD-10-CM | POA: Diagnosis not present

## 2019-08-23 DIAGNOSIS — N400 Enlarged prostate without lower urinary tract symptoms: Secondary | ICD-10-CM | POA: Diagnosis not present

## 2019-08-23 DIAGNOSIS — F0390 Unspecified dementia without behavioral disturbance: Secondary | ICD-10-CM | POA: Diagnosis not present

## 2019-08-23 DIAGNOSIS — J449 Chronic obstructive pulmonary disease, unspecified: Secondary | ICD-10-CM | POA: Diagnosis not present

## 2019-08-23 DIAGNOSIS — I5032 Chronic diastolic (congestive) heart failure: Secondary | ICD-10-CM | POA: Diagnosis not present

## 2019-08-23 DIAGNOSIS — E559 Vitamin D deficiency, unspecified: Secondary | ICD-10-CM | POA: Diagnosis not present

## 2019-08-23 DIAGNOSIS — I509 Heart failure, unspecified: Secondary | ICD-10-CM | POA: Diagnosis not present

## 2019-08-23 DIAGNOSIS — I711 Thoracic aortic aneurysm, ruptured: Secondary | ICD-10-CM | POA: Diagnosis not present

## 2019-08-23 DIAGNOSIS — I10 Essential (primary) hypertension: Secondary | ICD-10-CM | POA: Diagnosis not present

## 2019-08-23 DIAGNOSIS — I5030 Unspecified diastolic (congestive) heart failure: Secondary | ICD-10-CM | POA: Diagnosis not present

## 2019-08-23 DIAGNOSIS — F329 Major depressive disorder, single episode, unspecified: Secondary | ICD-10-CM | POA: Diagnosis not present

## 2019-08-23 DIAGNOSIS — I480 Paroxysmal atrial fibrillation: Secondary | ICD-10-CM | POA: Diagnosis not present

## 2019-08-23 DIAGNOSIS — E782 Mixed hyperlipidemia: Secondary | ICD-10-CM | POA: Diagnosis not present

## 2019-08-23 DIAGNOSIS — I2510 Atherosclerotic heart disease of native coronary artery without angina pectoris: Secondary | ICD-10-CM | POA: Diagnosis not present

## 2019-08-23 DIAGNOSIS — I4891 Unspecified atrial fibrillation: Secondary | ICD-10-CM | POA: Diagnosis not present

## 2019-08-25 DIAGNOSIS — I4891 Unspecified atrial fibrillation: Secondary | ICD-10-CM | POA: Diagnosis not present

## 2019-08-25 DIAGNOSIS — J449 Chronic obstructive pulmonary disease, unspecified: Secondary | ICD-10-CM | POA: Diagnosis not present

## 2019-08-25 DIAGNOSIS — I5030 Unspecified diastolic (congestive) heart failure: Secondary | ICD-10-CM | POA: Diagnosis not present

## 2019-08-25 DIAGNOSIS — I2510 Atherosclerotic heart disease of native coronary artery without angina pectoris: Secondary | ICD-10-CM | POA: Diagnosis not present

## 2019-08-25 DIAGNOSIS — I130 Hypertensive heart and chronic kidney disease with heart failure and stage 1 through stage 4 chronic kidney disease, or unspecified chronic kidney disease: Secondary | ICD-10-CM | POA: Diagnosis not present

## 2019-08-25 DIAGNOSIS — I711 Thoracic aortic aneurysm, ruptured: Secondary | ICD-10-CM | POA: Diagnosis not present

## 2019-08-27 DIAGNOSIS — I5030 Unspecified diastolic (congestive) heart failure: Secondary | ICD-10-CM | POA: Diagnosis not present

## 2019-08-27 DIAGNOSIS — I130 Hypertensive heart and chronic kidney disease with heart failure and stage 1 through stage 4 chronic kidney disease, or unspecified chronic kidney disease: Secondary | ICD-10-CM | POA: Diagnosis not present

## 2019-08-27 DIAGNOSIS — I711 Thoracic aortic aneurysm, ruptured: Secondary | ICD-10-CM | POA: Diagnosis not present

## 2019-08-27 DIAGNOSIS — I2510 Atherosclerotic heart disease of native coronary artery without angina pectoris: Secondary | ICD-10-CM | POA: Diagnosis not present

## 2019-08-27 DIAGNOSIS — I4891 Unspecified atrial fibrillation: Secondary | ICD-10-CM | POA: Diagnosis not present

## 2019-08-27 DIAGNOSIS — J449 Chronic obstructive pulmonary disease, unspecified: Secondary | ICD-10-CM | POA: Diagnosis not present

## 2019-08-30 DIAGNOSIS — I130 Hypertensive heart and chronic kidney disease with heart failure and stage 1 through stage 4 chronic kidney disease, or unspecified chronic kidney disease: Secondary | ICD-10-CM | POA: Diagnosis not present

## 2019-08-30 DIAGNOSIS — I4891 Unspecified atrial fibrillation: Secondary | ICD-10-CM | POA: Diagnosis not present

## 2019-08-30 DIAGNOSIS — I5030 Unspecified diastolic (congestive) heart failure: Secondary | ICD-10-CM | POA: Diagnosis not present

## 2019-08-30 DIAGNOSIS — J449 Chronic obstructive pulmonary disease, unspecified: Secondary | ICD-10-CM | POA: Diagnosis not present

## 2019-08-30 DIAGNOSIS — I2510 Atherosclerotic heart disease of native coronary artery without angina pectoris: Secondary | ICD-10-CM | POA: Diagnosis not present

## 2019-08-30 DIAGNOSIS — I711 Thoracic aortic aneurysm, ruptured: Secondary | ICD-10-CM | POA: Diagnosis not present

## 2019-08-31 DIAGNOSIS — J449 Chronic obstructive pulmonary disease, unspecified: Secondary | ICD-10-CM | POA: Diagnosis not present

## 2019-08-31 DIAGNOSIS — I711 Thoracic aortic aneurysm, ruptured: Secondary | ICD-10-CM | POA: Diagnosis not present

## 2019-08-31 DIAGNOSIS — I2510 Atherosclerotic heart disease of native coronary artery without angina pectoris: Secondary | ICD-10-CM | POA: Diagnosis not present

## 2019-08-31 DIAGNOSIS — I4891 Unspecified atrial fibrillation: Secondary | ICD-10-CM | POA: Diagnosis not present

## 2019-08-31 DIAGNOSIS — I5030 Unspecified diastolic (congestive) heart failure: Secondary | ICD-10-CM | POA: Diagnosis not present

## 2019-08-31 DIAGNOSIS — I130 Hypertensive heart and chronic kidney disease with heart failure and stage 1 through stage 4 chronic kidney disease, or unspecified chronic kidney disease: Secondary | ICD-10-CM | POA: Diagnosis not present

## 2019-09-03 DIAGNOSIS — I4891 Unspecified atrial fibrillation: Secondary | ICD-10-CM | POA: Diagnosis not present

## 2019-09-03 DIAGNOSIS — I130 Hypertensive heart and chronic kidney disease with heart failure and stage 1 through stage 4 chronic kidney disease, or unspecified chronic kidney disease: Secondary | ICD-10-CM | POA: Diagnosis not present

## 2019-09-03 DIAGNOSIS — J449 Chronic obstructive pulmonary disease, unspecified: Secondary | ICD-10-CM | POA: Diagnosis not present

## 2019-09-03 DIAGNOSIS — I711 Thoracic aortic aneurysm, ruptured: Secondary | ICD-10-CM | POA: Diagnosis not present

## 2019-09-03 DIAGNOSIS — I5030 Unspecified diastolic (congestive) heart failure: Secondary | ICD-10-CM | POA: Diagnosis not present

## 2019-09-03 DIAGNOSIS — I2510 Atherosclerotic heart disease of native coronary artery without angina pectoris: Secondary | ICD-10-CM | POA: Diagnosis not present

## 2019-09-04 DIAGNOSIS — I2510 Atherosclerotic heart disease of native coronary artery without angina pectoris: Secondary | ICD-10-CM | POA: Diagnosis not present

## 2019-09-04 DIAGNOSIS — I130 Hypertensive heart and chronic kidney disease with heart failure and stage 1 through stage 4 chronic kidney disease, or unspecified chronic kidney disease: Secondary | ICD-10-CM | POA: Diagnosis not present

## 2019-09-04 DIAGNOSIS — J449 Chronic obstructive pulmonary disease, unspecified: Secondary | ICD-10-CM | POA: Diagnosis not present

## 2019-09-04 DIAGNOSIS — I5030 Unspecified diastolic (congestive) heart failure: Secondary | ICD-10-CM | POA: Diagnosis not present

## 2019-09-04 DIAGNOSIS — I711 Thoracic aortic aneurysm, ruptured: Secondary | ICD-10-CM | POA: Diagnosis not present

## 2019-09-04 DIAGNOSIS — I4891 Unspecified atrial fibrillation: Secondary | ICD-10-CM | POA: Diagnosis not present

## 2019-09-06 DIAGNOSIS — I2510 Atherosclerotic heart disease of native coronary artery without angina pectoris: Secondary | ICD-10-CM | POA: Diagnosis not present

## 2019-09-06 DIAGNOSIS — I130 Hypertensive heart and chronic kidney disease with heart failure and stage 1 through stage 4 chronic kidney disease, or unspecified chronic kidney disease: Secondary | ICD-10-CM | POA: Diagnosis not present

## 2019-09-06 DIAGNOSIS — I5030 Unspecified diastolic (congestive) heart failure: Secondary | ICD-10-CM | POA: Diagnosis not present

## 2019-09-06 DIAGNOSIS — I4891 Unspecified atrial fibrillation: Secondary | ICD-10-CM | POA: Diagnosis not present

## 2019-09-06 DIAGNOSIS — I711 Thoracic aortic aneurysm, ruptured: Secondary | ICD-10-CM | POA: Diagnosis not present

## 2019-09-06 DIAGNOSIS — J449 Chronic obstructive pulmonary disease, unspecified: Secondary | ICD-10-CM | POA: Diagnosis not present

## 2019-09-10 DIAGNOSIS — I130 Hypertensive heart and chronic kidney disease with heart failure and stage 1 through stage 4 chronic kidney disease, or unspecified chronic kidney disease: Secondary | ICD-10-CM | POA: Diagnosis not present

## 2019-09-10 DIAGNOSIS — J449 Chronic obstructive pulmonary disease, unspecified: Secondary | ICD-10-CM | POA: Diagnosis not present

## 2019-09-10 DIAGNOSIS — I711 Thoracic aortic aneurysm, ruptured: Secondary | ICD-10-CM | POA: Diagnosis not present

## 2019-09-10 DIAGNOSIS — I2510 Atherosclerotic heart disease of native coronary artery without angina pectoris: Secondary | ICD-10-CM | POA: Diagnosis not present

## 2019-09-10 DIAGNOSIS — I5030 Unspecified diastolic (congestive) heart failure: Secondary | ICD-10-CM | POA: Diagnosis not present

## 2019-09-10 DIAGNOSIS — I4891 Unspecified atrial fibrillation: Secondary | ICD-10-CM | POA: Diagnosis not present

## 2019-09-11 ENCOUNTER — Encounter: Payer: Self-pay | Admitting: Cardiovascular Disease

## 2019-09-11 NOTE — Telephone Encounter (Signed)
error 

## 2019-09-13 DIAGNOSIS — I2510 Atherosclerotic heart disease of native coronary artery without angina pectoris: Secondary | ICD-10-CM | POA: Diagnosis not present

## 2019-09-13 DIAGNOSIS — N184 Chronic kidney disease, stage 4 (severe): Secondary | ICD-10-CM | POA: Diagnosis not present

## 2019-09-13 DIAGNOSIS — D631 Anemia in chronic kidney disease: Secondary | ICD-10-CM | POA: Diagnosis not present

## 2019-09-13 DIAGNOSIS — H3530 Unspecified macular degeneration: Secondary | ICD-10-CM | POA: Diagnosis not present

## 2019-09-13 DIAGNOSIS — I130 Hypertensive heart and chronic kidney disease with heart failure and stage 1 through stage 4 chronic kidney disease, or unspecified chronic kidney disease: Secondary | ICD-10-CM | POA: Diagnosis not present

## 2019-09-13 DIAGNOSIS — N401 Enlarged prostate with lower urinary tract symptoms: Secondary | ICD-10-CM | POA: Diagnosis not present

## 2019-09-13 DIAGNOSIS — N138 Other obstructive and reflux uropathy: Secondary | ICD-10-CM | POA: Diagnosis not present

## 2019-09-13 DIAGNOSIS — I4891 Unspecified atrial fibrillation: Secondary | ICD-10-CM | POA: Diagnosis not present

## 2019-09-13 DIAGNOSIS — I711 Thoracic aortic aneurysm, ruptured: Secondary | ICD-10-CM | POA: Diagnosis not present

## 2019-09-13 DIAGNOSIS — Z741 Need for assistance with personal care: Secondary | ICD-10-CM | POA: Diagnosis not present

## 2019-09-13 DIAGNOSIS — J302 Other seasonal allergic rhinitis: Secondary | ICD-10-CM | POA: Diagnosis not present

## 2019-09-13 DIAGNOSIS — J449 Chronic obstructive pulmonary disease, unspecified: Secondary | ICD-10-CM | POA: Diagnosis not present

## 2019-09-13 DIAGNOSIS — Z8616 Personal history of COVID-19: Secondary | ICD-10-CM | POA: Diagnosis not present

## 2019-09-13 DIAGNOSIS — R1310 Dysphagia, unspecified: Secondary | ICD-10-CM | POA: Diagnosis not present

## 2019-09-13 DIAGNOSIS — I5030 Unspecified diastolic (congestive) heart failure: Secondary | ICD-10-CM | POA: Diagnosis not present

## 2019-09-13 DIAGNOSIS — F329 Major depressive disorder, single episode, unspecified: Secondary | ICD-10-CM | POA: Diagnosis not present

## 2019-09-13 DIAGNOSIS — F29 Unspecified psychosis not due to a substance or known physiological condition: Secondary | ICD-10-CM | POA: Diagnosis not present

## 2019-09-13 DIAGNOSIS — L853 Xerosis cutis: Secondary | ICD-10-CM | POA: Diagnosis not present

## 2019-09-13 DIAGNOSIS — G629 Polyneuropathy, unspecified: Secondary | ICD-10-CM | POA: Diagnosis not present

## 2019-09-13 DIAGNOSIS — H6120 Impacted cerumen, unspecified ear: Secondary | ICD-10-CM | POA: Diagnosis not present

## 2019-09-13 DIAGNOSIS — K219 Gastro-esophageal reflux disease without esophagitis: Secondary | ICD-10-CM | POA: Diagnosis not present

## 2019-09-13 DIAGNOSIS — Z6827 Body mass index (BMI) 27.0-27.9, adult: Secondary | ICD-10-CM | POA: Diagnosis not present

## 2019-09-13 DIAGNOSIS — I5032 Chronic diastolic (congestive) heart failure: Secondary | ICD-10-CM | POA: Diagnosis not present

## 2019-09-13 DIAGNOSIS — F4310 Post-traumatic stress disorder, unspecified: Secondary | ICD-10-CM | POA: Diagnosis not present

## 2019-09-14 DIAGNOSIS — I10 Essential (primary) hypertension: Secondary | ICD-10-CM | POA: Diagnosis not present

## 2019-09-14 DIAGNOSIS — Z0000 Encounter for general adult medical examination without abnormal findings: Secondary | ICD-10-CM | POA: Diagnosis not present

## 2019-09-14 DIAGNOSIS — R1310 Dysphagia, unspecified: Secondary | ICD-10-CM | POA: Diagnosis not present

## 2019-09-14 DIAGNOSIS — J449 Chronic obstructive pulmonary disease, unspecified: Secondary | ICD-10-CM | POA: Diagnosis not present

## 2019-09-14 DIAGNOSIS — N184 Chronic kidney disease, stage 4 (severe): Secondary | ICD-10-CM | POA: Diagnosis not present

## 2019-09-17 DIAGNOSIS — I711 Thoracic aortic aneurysm, ruptured: Secondary | ICD-10-CM | POA: Diagnosis not present

## 2019-09-17 DIAGNOSIS — J449 Chronic obstructive pulmonary disease, unspecified: Secondary | ICD-10-CM | POA: Diagnosis not present

## 2019-09-17 DIAGNOSIS — I5030 Unspecified diastolic (congestive) heart failure: Secondary | ICD-10-CM | POA: Diagnosis not present

## 2019-09-17 DIAGNOSIS — I2510 Atherosclerotic heart disease of native coronary artery without angina pectoris: Secondary | ICD-10-CM | POA: Diagnosis not present

## 2019-09-17 DIAGNOSIS — I130 Hypertensive heart and chronic kidney disease with heart failure and stage 1 through stage 4 chronic kidney disease, or unspecified chronic kidney disease: Secondary | ICD-10-CM | POA: Diagnosis not present

## 2019-09-17 DIAGNOSIS — I4891 Unspecified atrial fibrillation: Secondary | ICD-10-CM | POA: Diagnosis not present

## 2019-09-19 DIAGNOSIS — I130 Hypertensive heart and chronic kidney disease with heart failure and stage 1 through stage 4 chronic kidney disease, or unspecified chronic kidney disease: Secondary | ICD-10-CM | POA: Diagnosis not present

## 2019-09-19 DIAGNOSIS — J449 Chronic obstructive pulmonary disease, unspecified: Secondary | ICD-10-CM | POA: Diagnosis not present

## 2019-09-19 DIAGNOSIS — I2510 Atherosclerotic heart disease of native coronary artery without angina pectoris: Secondary | ICD-10-CM | POA: Diagnosis not present

## 2019-09-19 DIAGNOSIS — I4891 Unspecified atrial fibrillation: Secondary | ICD-10-CM | POA: Diagnosis not present

## 2019-09-19 DIAGNOSIS — I5030 Unspecified diastolic (congestive) heart failure: Secondary | ICD-10-CM | POA: Diagnosis not present

## 2019-09-19 DIAGNOSIS — I711 Thoracic aortic aneurysm, ruptured: Secondary | ICD-10-CM | POA: Diagnosis not present

## 2019-09-19 DIAGNOSIS — A0471 Enterocolitis due to Clostridium difficile, recurrent: Secondary | ICD-10-CM | POA: Diagnosis not present

## 2019-09-20 DIAGNOSIS — I711 Thoracic aortic aneurysm, ruptured: Secondary | ICD-10-CM | POA: Diagnosis not present

## 2019-09-20 DIAGNOSIS — I130 Hypertensive heart and chronic kidney disease with heart failure and stage 1 through stage 4 chronic kidney disease, or unspecified chronic kidney disease: Secondary | ICD-10-CM | POA: Diagnosis not present

## 2019-09-20 DIAGNOSIS — I5030 Unspecified diastolic (congestive) heart failure: Secondary | ICD-10-CM | POA: Diagnosis not present

## 2019-09-20 DIAGNOSIS — I2510 Atherosclerotic heart disease of native coronary artery without angina pectoris: Secondary | ICD-10-CM | POA: Diagnosis not present

## 2019-09-20 DIAGNOSIS — I4891 Unspecified atrial fibrillation: Secondary | ICD-10-CM | POA: Diagnosis not present

## 2019-09-20 DIAGNOSIS — J449 Chronic obstructive pulmonary disease, unspecified: Secondary | ICD-10-CM | POA: Diagnosis not present

## 2019-09-24 DIAGNOSIS — J449 Chronic obstructive pulmonary disease, unspecified: Secondary | ICD-10-CM | POA: Diagnosis not present

## 2019-09-24 DIAGNOSIS — I130 Hypertensive heart and chronic kidney disease with heart failure and stage 1 through stage 4 chronic kidney disease, or unspecified chronic kidney disease: Secondary | ICD-10-CM | POA: Diagnosis not present

## 2019-09-24 DIAGNOSIS — I5030 Unspecified diastolic (congestive) heart failure: Secondary | ICD-10-CM | POA: Diagnosis not present

## 2019-09-24 DIAGNOSIS — I2510 Atherosclerotic heart disease of native coronary artery without angina pectoris: Secondary | ICD-10-CM | POA: Diagnosis not present

## 2019-09-24 DIAGNOSIS — I711 Thoracic aortic aneurysm, ruptured: Secondary | ICD-10-CM | POA: Diagnosis not present

## 2019-09-24 DIAGNOSIS — I4891 Unspecified atrial fibrillation: Secondary | ICD-10-CM | POA: Diagnosis not present

## 2019-09-27 DIAGNOSIS — I2510 Atherosclerotic heart disease of native coronary artery without angina pectoris: Secondary | ICD-10-CM | POA: Diagnosis not present

## 2019-09-27 DIAGNOSIS — J449 Chronic obstructive pulmonary disease, unspecified: Secondary | ICD-10-CM | POA: Diagnosis not present

## 2019-09-27 DIAGNOSIS — I711 Thoracic aortic aneurysm, ruptured: Secondary | ICD-10-CM | POA: Diagnosis not present

## 2019-09-27 DIAGNOSIS — I4891 Unspecified atrial fibrillation: Secondary | ICD-10-CM | POA: Diagnosis not present

## 2019-09-27 DIAGNOSIS — I130 Hypertensive heart and chronic kidney disease with heart failure and stage 1 through stage 4 chronic kidney disease, or unspecified chronic kidney disease: Secondary | ICD-10-CM | POA: Diagnosis not present

## 2019-09-27 DIAGNOSIS — I5030 Unspecified diastolic (congestive) heart failure: Secondary | ICD-10-CM | POA: Diagnosis not present

## 2019-09-29 DIAGNOSIS — I130 Hypertensive heart and chronic kidney disease with heart failure and stage 1 through stage 4 chronic kidney disease, or unspecified chronic kidney disease: Secondary | ICD-10-CM | POA: Diagnosis not present

## 2019-09-29 DIAGNOSIS — I4891 Unspecified atrial fibrillation: Secondary | ICD-10-CM | POA: Diagnosis not present

## 2019-09-29 DIAGNOSIS — N390 Urinary tract infection, site not specified: Secondary | ICD-10-CM | POA: Diagnosis not present

## 2019-09-29 DIAGNOSIS — J449 Chronic obstructive pulmonary disease, unspecified: Secondary | ICD-10-CM | POA: Diagnosis not present

## 2019-09-29 DIAGNOSIS — I2510 Atherosclerotic heart disease of native coronary artery without angina pectoris: Secondary | ICD-10-CM | POA: Diagnosis not present

## 2019-09-29 DIAGNOSIS — I5030 Unspecified diastolic (congestive) heart failure: Secondary | ICD-10-CM | POA: Diagnosis not present

## 2019-09-29 DIAGNOSIS — I711 Thoracic aortic aneurysm, ruptured: Secondary | ICD-10-CM | POA: Diagnosis not present

## 2019-09-30 DIAGNOSIS — N390 Urinary tract infection, site not specified: Secondary | ICD-10-CM | POA: Diagnosis not present

## 2019-10-02 IMAGING — CT CT CHEST W/O CM
2 of 3 series · 15 of 36 positions shown, 18 images · non-contrast
Comparison: 07/15/2017 chest x-ray.  Chest CT 05/12/2017

CLINICAL DATA: Shortness of breath, weakness

EXAM:
CT CHEST WITHOUT CONTRAST
TECHNIQUE: Multidetector CT imaging of the chest was performed following the
standard protocol without IV contrast.

[Series 4: thorax 2.0 · axial · 0.75mm/px · z∈[+1333,+1541]mm · 12 of 124 slices shown, 15 images]
[im 10/124  mediastinal]
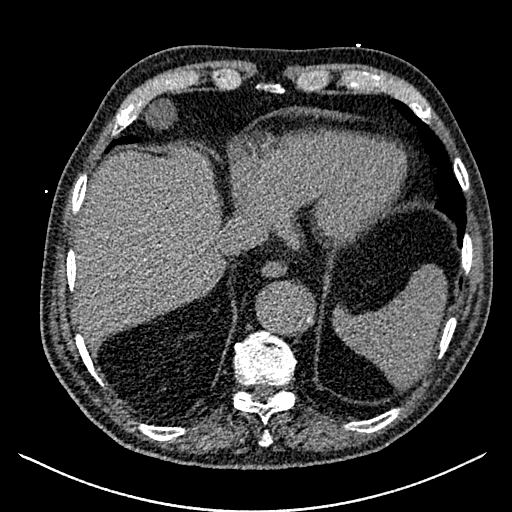
[im 10/124  lung]
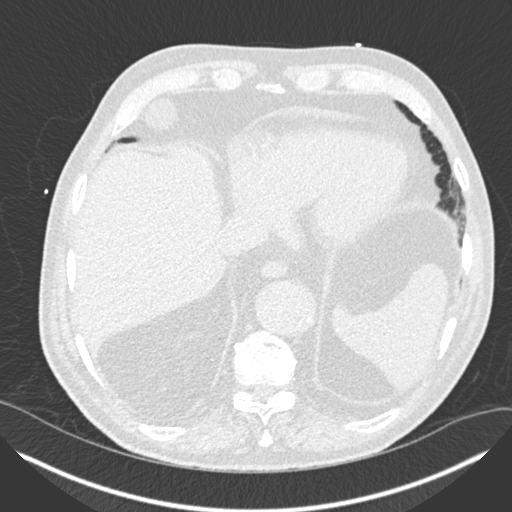
[im 19/124  lung]
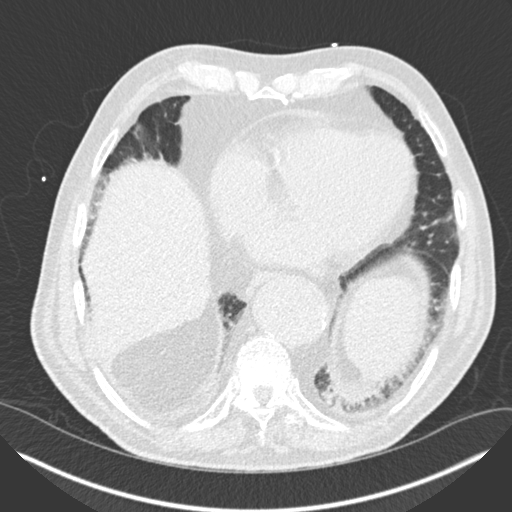
[im 28/124  lung]
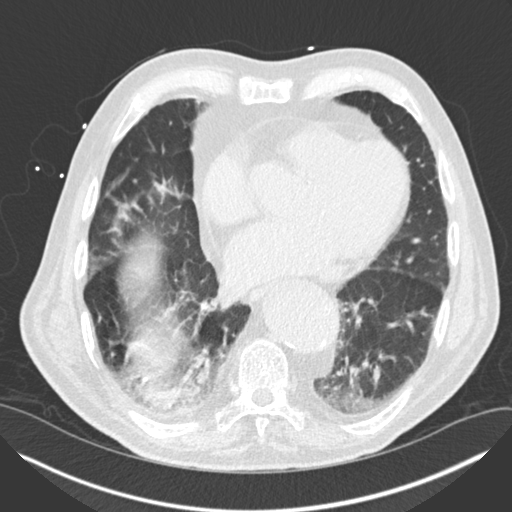
[im 37/124  lung]
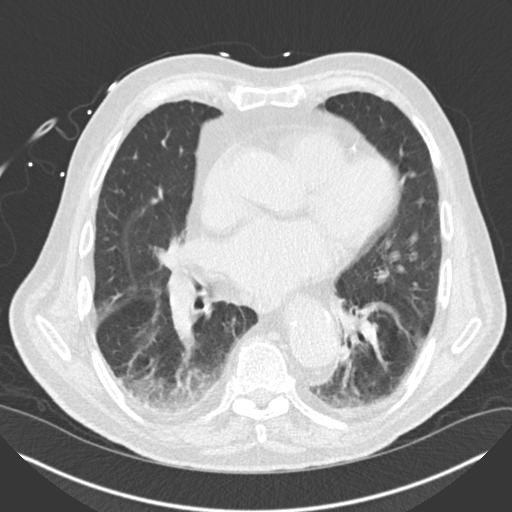
[im 46/124  mediastinal]
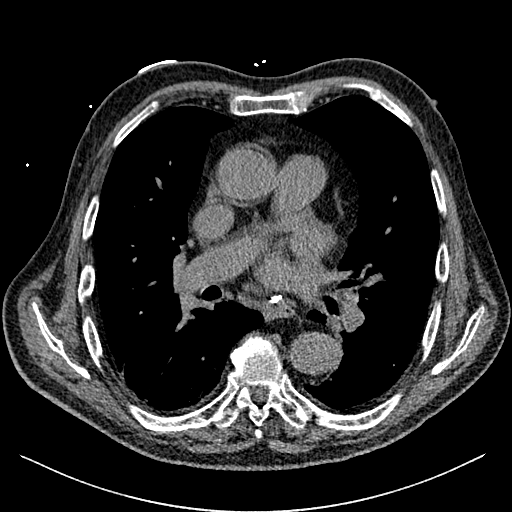
[im 46/124  lung]
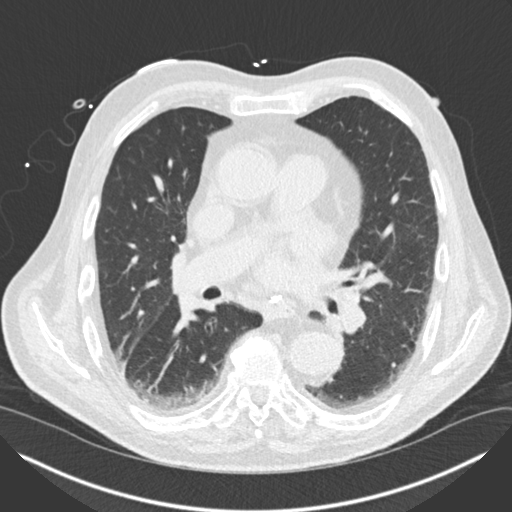
[im 55/124  lung]
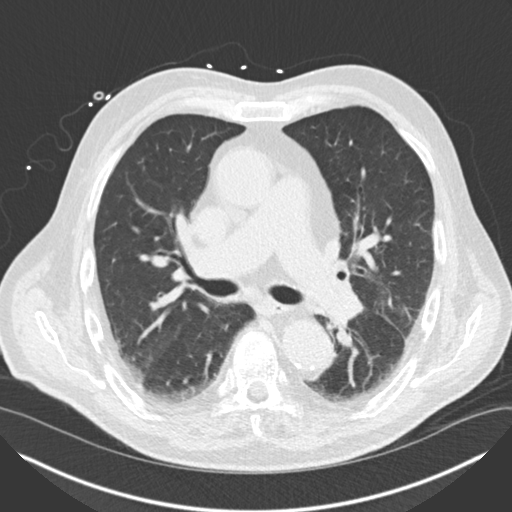
[im 69/124  lung]
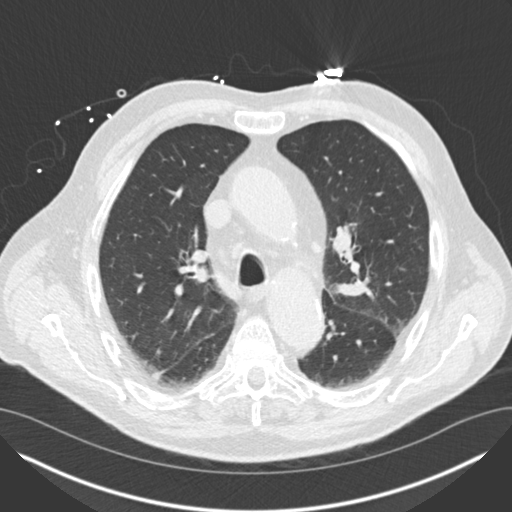
[im 78/124  lung]
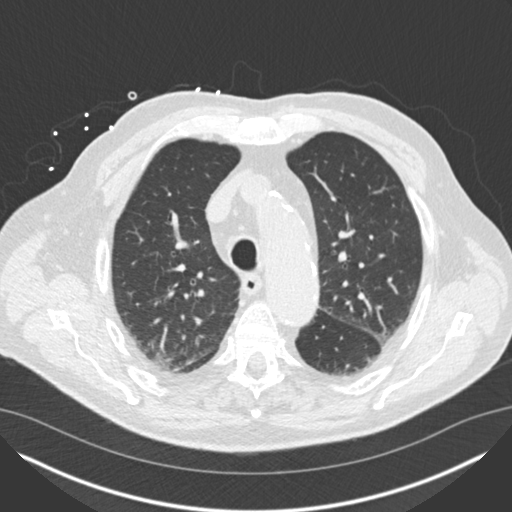
[im 87/124  mediastinal]
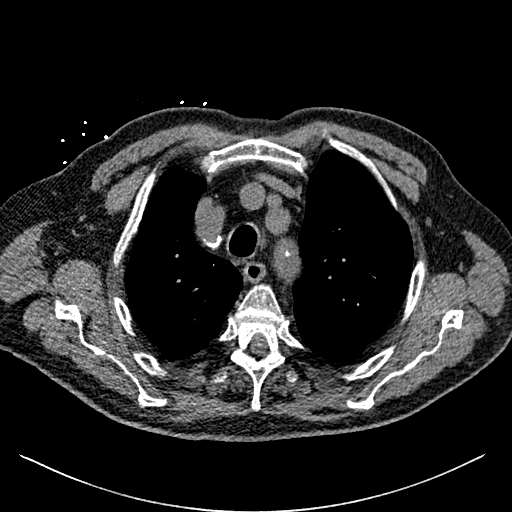
[im 87/124  lung]
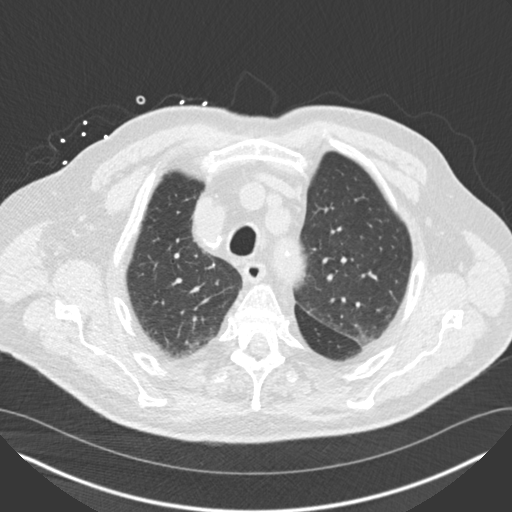
[im 96/124  lung]
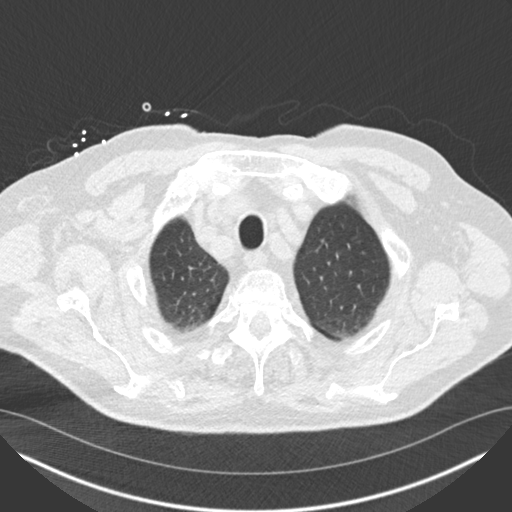
[im 105/124  lung]
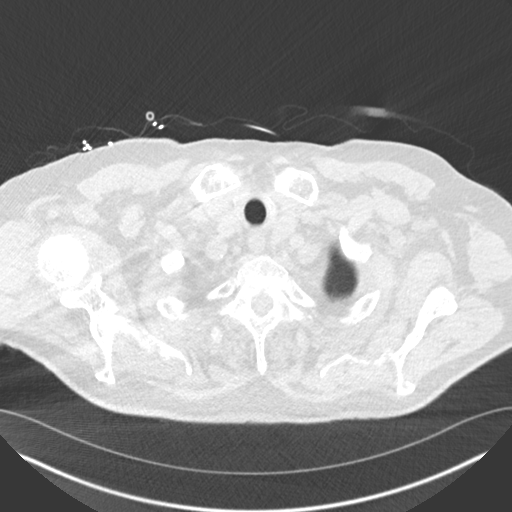
[im 114/124  lung]
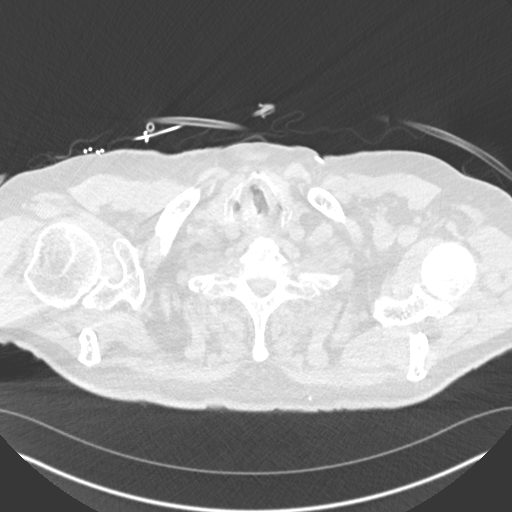

[Series 6: coronal · coronal · 0.52mm/px · 3 of 101 slices shown]
[im 21/101  lung]
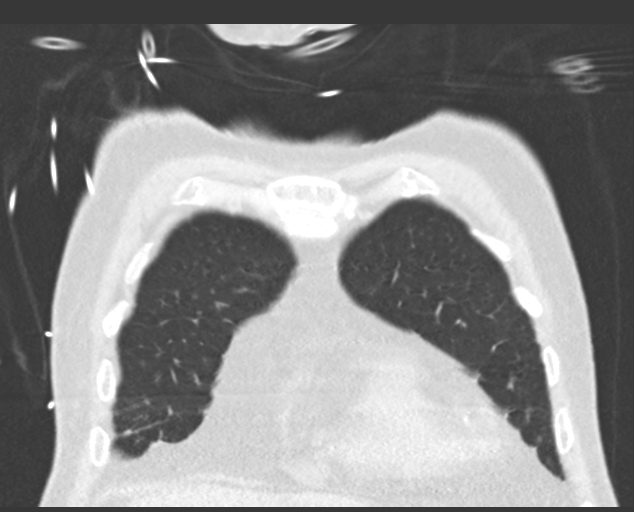
[im 41/101  lung]
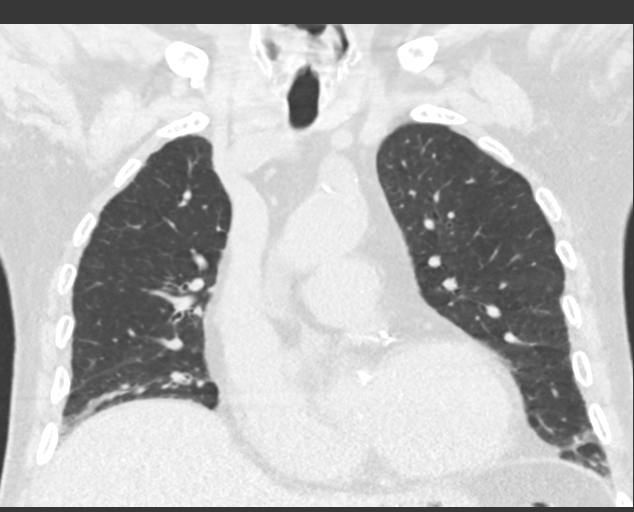
[im 61/101  lung]
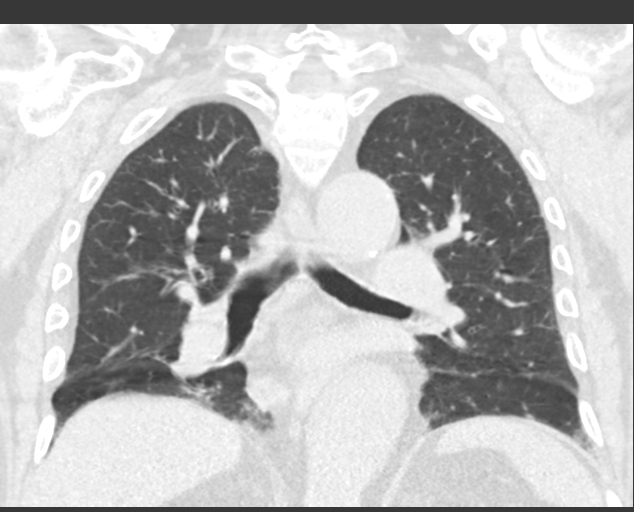

[15 of 36 positions shown; findings below may reference images not displayed]

FINDINGS: Cardiovascular: Moderate aortic atherosclerosis. Distal descending
thoracic aortic aneurysm measures 5.1 cm compared to 5.4 cm
previously. Heart is upper limits normal in size. Advanced coronary
artery calcifications, most pronounced in the left coronary
arteries.

Mediastinum/Nodes: No mediastinal, hilar, or axillary adenopathy.

Lungs/Pleura: Continued interstitial thickening noted in the lung
bases, stable since prior CT. No effusions. Small calcified
granulomas in the right middle lobe and left lower lobe.

Upper Abdomen: Imaging into the upper abdomen shows no acute
findings. Stable low-density lesions within the visualized liver,
likely cysts.

Musculoskeletal: Chest wall soft tissues are unremarkable. No acute
bony abnormality.
IMPRESSION: Continued interstitial thickening in the lung bases could reflect
atelectasis or scarring. This is stable since prior CT.

5.1 cm distal descending thoracic aortic aneurysm.

Advanced coronary artery disease.

Aortic Atherosclerosis ([id number]).

## 2019-10-04 DIAGNOSIS — I130 Hypertensive heart and chronic kidney disease with heart failure and stage 1 through stage 4 chronic kidney disease, or unspecified chronic kidney disease: Secondary | ICD-10-CM | POA: Diagnosis not present

## 2019-10-04 DIAGNOSIS — I5030 Unspecified diastolic (congestive) heart failure: Secondary | ICD-10-CM | POA: Diagnosis not present

## 2019-10-04 DIAGNOSIS — I4891 Unspecified atrial fibrillation: Secondary | ICD-10-CM | POA: Diagnosis not present

## 2019-10-04 DIAGNOSIS — J449 Chronic obstructive pulmonary disease, unspecified: Secondary | ICD-10-CM | POA: Diagnosis not present

## 2019-10-04 DIAGNOSIS — I2510 Atherosclerotic heart disease of native coronary artery without angina pectoris: Secondary | ICD-10-CM | POA: Diagnosis not present

## 2019-10-04 DIAGNOSIS — I711 Thoracic aortic aneurysm, ruptured: Secondary | ICD-10-CM | POA: Diagnosis not present

## 2019-10-05 DIAGNOSIS — F329 Major depressive disorder, single episode, unspecified: Secondary | ICD-10-CM | POA: Diagnosis not present

## 2019-10-05 DIAGNOSIS — I509 Heart failure, unspecified: Secondary | ICD-10-CM | POA: Diagnosis not present

## 2019-10-05 DIAGNOSIS — J449 Chronic obstructive pulmonary disease, unspecified: Secondary | ICD-10-CM | POA: Diagnosis not present

## 2019-10-05 DIAGNOSIS — N400 Enlarged prostate without lower urinary tract symptoms: Secondary | ICD-10-CM | POA: Diagnosis not present

## 2019-10-05 DIAGNOSIS — I480 Paroxysmal atrial fibrillation: Secondary | ICD-10-CM | POA: Diagnosis not present

## 2019-10-05 DIAGNOSIS — F0390 Unspecified dementia without behavioral disturbance: Secondary | ICD-10-CM | POA: Diagnosis not present

## 2019-10-05 DIAGNOSIS — E559 Vitamin D deficiency, unspecified: Secondary | ICD-10-CM | POA: Diagnosis not present

## 2019-10-05 DIAGNOSIS — I2510 Atherosclerotic heart disease of native coronary artery without angina pectoris: Secondary | ICD-10-CM | POA: Diagnosis not present

## 2019-10-05 DIAGNOSIS — I10 Essential (primary) hypertension: Secondary | ICD-10-CM | POA: Diagnosis not present

## 2019-10-05 DIAGNOSIS — I5030 Unspecified diastolic (congestive) heart failure: Secondary | ICD-10-CM | POA: Diagnosis not present

## 2019-10-05 DIAGNOSIS — E782 Mixed hyperlipidemia: Secondary | ICD-10-CM | POA: Diagnosis not present

## 2019-10-05 DIAGNOSIS — I5032 Chronic diastolic (congestive) heart failure: Secondary | ICD-10-CM | POA: Diagnosis not present

## 2019-10-08 DIAGNOSIS — I2510 Atherosclerotic heart disease of native coronary artery without angina pectoris: Secondary | ICD-10-CM | POA: Diagnosis not present

## 2019-10-08 DIAGNOSIS — I5030 Unspecified diastolic (congestive) heart failure: Secondary | ICD-10-CM | POA: Diagnosis not present

## 2019-10-08 DIAGNOSIS — I4891 Unspecified atrial fibrillation: Secondary | ICD-10-CM | POA: Diagnosis not present

## 2019-10-08 DIAGNOSIS — J449 Chronic obstructive pulmonary disease, unspecified: Secondary | ICD-10-CM | POA: Diagnosis not present

## 2019-10-08 DIAGNOSIS — I711 Thoracic aortic aneurysm, ruptured: Secondary | ICD-10-CM | POA: Diagnosis not present

## 2019-10-08 DIAGNOSIS — I130 Hypertensive heart and chronic kidney disease with heart failure and stage 1 through stage 4 chronic kidney disease, or unspecified chronic kidney disease: Secondary | ICD-10-CM | POA: Diagnosis not present

## 2019-10-11 DIAGNOSIS — Z515 Encounter for palliative care: Secondary | ICD-10-CM

## 2019-10-11 DIAGNOSIS — I130 Hypertensive heart and chronic kidney disease with heart failure and stage 1 through stage 4 chronic kidney disease, or unspecified chronic kidney disease: Secondary | ICD-10-CM | POA: Diagnosis not present

## 2019-10-11 DIAGNOSIS — I711 Thoracic aortic aneurysm, ruptured: Secondary | ICD-10-CM | POA: Diagnosis not present

## 2019-10-11 DIAGNOSIS — I2510 Atherosclerotic heart disease of native coronary artery without angina pectoris: Secondary | ICD-10-CM | POA: Diagnosis not present

## 2019-10-11 DIAGNOSIS — I4891 Unspecified atrial fibrillation: Secondary | ICD-10-CM | POA: Diagnosis not present

## 2019-10-11 DIAGNOSIS — J449 Chronic obstructive pulmonary disease, unspecified: Secondary | ICD-10-CM | POA: Diagnosis not present

## 2019-10-11 DIAGNOSIS — I5030 Unspecified diastolic (congestive) heart failure: Secondary | ICD-10-CM | POA: Diagnosis not present

## 2019-10-11 DIAGNOSIS — Z7189 Other specified counseling: Secondary | ICD-10-CM

## 2019-10-13 ENCOUNTER — Telehealth (INDEPENDENT_AMBULATORY_CARE_PROVIDER_SITE_OTHER): Payer: Medicare Other | Admitting: Physician Assistant

## 2019-10-13 VITALS — BP 122/62 | HR 70 | Wt 167.6 lb

## 2019-10-13 DIAGNOSIS — I2510 Atherosclerotic heart disease of native coronary artery without angina pectoris: Secondary | ICD-10-CM

## 2019-10-13 DIAGNOSIS — Z8616 Personal history of COVID-19: Secondary | ICD-10-CM | POA: Diagnosis not present

## 2019-10-13 DIAGNOSIS — H3530 Unspecified macular degeneration: Secondary | ICD-10-CM | POA: Diagnosis not present

## 2019-10-13 DIAGNOSIS — I714 Abdominal aortic aneurysm, without rupture, unspecified: Secondary | ICD-10-CM

## 2019-10-13 DIAGNOSIS — N184 Chronic kidney disease, stage 4 (severe): Secondary | ICD-10-CM

## 2019-10-13 DIAGNOSIS — G629 Polyneuropathy, unspecified: Secondary | ICD-10-CM | POA: Diagnosis not present

## 2019-10-13 DIAGNOSIS — N138 Other obstructive and reflux uropathy: Secondary | ICD-10-CM | POA: Diagnosis not present

## 2019-10-13 DIAGNOSIS — J449 Chronic obstructive pulmonary disease, unspecified: Secondary | ICD-10-CM | POA: Diagnosis not present

## 2019-10-13 DIAGNOSIS — F4310 Post-traumatic stress disorder, unspecified: Secondary | ICD-10-CM | POA: Diagnosis not present

## 2019-10-13 DIAGNOSIS — I711 Thoracic aortic aneurysm, ruptured: Secondary | ICD-10-CM | POA: Diagnosis not present

## 2019-10-13 DIAGNOSIS — Z466 Encounter for fitting and adjustment of urinary device: Secondary | ICD-10-CM | POA: Diagnosis not present

## 2019-10-13 DIAGNOSIS — Z741 Need for assistance with personal care: Secondary | ICD-10-CM | POA: Diagnosis not present

## 2019-10-13 DIAGNOSIS — I480 Paroxysmal atrial fibrillation: Secondary | ICD-10-CM

## 2019-10-13 DIAGNOSIS — J302 Other seasonal allergic rhinitis: Secondary | ICD-10-CM | POA: Diagnosis not present

## 2019-10-13 DIAGNOSIS — L853 Xerosis cutis: Secondary | ICD-10-CM | POA: Diagnosis not present

## 2019-10-13 DIAGNOSIS — K219 Gastro-esophageal reflux disease without esophagitis: Secondary | ICD-10-CM | POA: Diagnosis not present

## 2019-10-13 DIAGNOSIS — D631 Anemia in chronic kidney disease: Secondary | ICD-10-CM | POA: Diagnosis not present

## 2019-10-13 DIAGNOSIS — I130 Hypertensive heart and chronic kidney disease with heart failure and stage 1 through stage 4 chronic kidney disease, or unspecified chronic kidney disease: Secondary | ICD-10-CM | POA: Diagnosis not present

## 2019-10-13 DIAGNOSIS — R159 Full incontinence of feces: Secondary | ICD-10-CM | POA: Diagnosis not present

## 2019-10-13 DIAGNOSIS — I5032 Chronic diastolic (congestive) heart failure: Secondary | ICD-10-CM | POA: Diagnosis not present

## 2019-10-13 DIAGNOSIS — F29 Unspecified psychosis not due to a substance or known physiological condition: Secondary | ICD-10-CM | POA: Diagnosis not present

## 2019-10-13 DIAGNOSIS — R296 Repeated falls: Secondary | ICD-10-CM | POA: Diagnosis not present

## 2019-10-13 DIAGNOSIS — Z9861 Coronary angioplasty status: Secondary | ICD-10-CM

## 2019-10-13 DIAGNOSIS — R1310 Dysphagia, unspecified: Secondary | ICD-10-CM | POA: Diagnosis not present

## 2019-10-13 DIAGNOSIS — Z6823 Body mass index (BMI) 23.0-23.9, adult: Secondary | ICD-10-CM | POA: Diagnosis not present

## 2019-10-13 DIAGNOSIS — F32A Depression, unspecified: Secondary | ICD-10-CM | POA: Diagnosis not present

## 2019-10-13 DIAGNOSIS — I4891 Unspecified atrial fibrillation: Secondary | ICD-10-CM | POA: Diagnosis not present

## 2019-10-13 DIAGNOSIS — N401 Enlarged prostate with lower urinary tract symptoms: Secondary | ICD-10-CM | POA: Diagnosis not present

## 2019-10-13 DIAGNOSIS — I5030 Unspecified diastolic (congestive) heart failure: Secondary | ICD-10-CM | POA: Diagnosis not present

## 2019-10-13 NOTE — Patient Instructions (Addendum)
  Follow-Up: At [facility], you and your health needs are our priority.  As part of our continuing mission to provide you with exceptional heart care, we have created designated Provider Care Teams.  These Care Teams include your primary Cardiologist (physician) and Advanced Practice Providers (APPs -  Physician Assistants and Nurse Practitioners) who all work together to provide you with the care you need, when you need it.   Your next appointment:   6 month(s)  The format for your next appointment:   Virtual Visit   Provider:   [name], MD

## 2019-10-13 NOTE — Progress Notes (Signed)
Virtual Visit via Telephone Note   This visit type was conducted due to national recommendations for restrictions regarding the COVID-19 Pandemic (e.g. social distancing) in an effort to limit this patient's exposure and mitigate transmission in our community.  Due to his co-morbid illnesses, this patient is at least at moderate risk for complications without adequate follow up.  This format is felt to be most appropriate for this patient at this time.  The patient did not have access to video technology/had technical difficulties with video requiring transitioning to audio format only (telephone).  All issues noted in this document were discussed and addressed.  No physical exam could be performed with this format.  Please refer to the patient's chart for his  consent to telehealth for [facility]    Date:  10/15/2019   ID:  [name], DOB 1925-12-23, MRN [id number] The patient was identified using 2 identifiers.  Patient Location: Home Provider Location: Office/Clinic  PCP:  Jodi Marble, MD  Cardiologist:  [name], MD  Electrophysiologist:  None   Evaluation Performed:  Follow-Up Visit  Chief Complaint:  Follow up  History of Present Illness:    [name] [name] is a 86 y.o. male with CAD, chronic diastolic heart failure, PAF, COPD, urinary retention and history of encephalopathy.  Cardiac catheterization performed in April 2016 showed chronic total occlusion of proximal LAD with distal filling via left to left and right-to-left collaterals, mild to moderate disease in the left circumflex and RCA, 95% stenosis in the distal RCA that was treated with 3.5 x 16 mm Xience Alpine DES.  CT of abdomen at the time found a very small supra celiac abdominal aortic aneurysm measuring at 2.7 cm.  Last echocardiogram obtained in August 2019 showed EF 60 to 65%, trivial AI, mild LAE, peak PA pressure 33 mmHg.  He resides at [facility] skilled nursing facility.  Patient was last seen by  Bunnie Domino, NP on 07/11/2019, he was in atrial fibrillation at the time, heart rate was elevated at 113.  Due to change in his renal function, Eliquis was reduced to 2.5 mg twice a day.  Patient subsequently presented to the hospital on the same day with generalized weakness and hypoxia.  CT scan showed an incidental finding of large thoracoabdominal aneurysm measuring 7.2 cm at the level of diaphragmatic hiatus with possible leak.  The same image also revealed bladder outlet obstruction with bilateral hydronephrosis and hydroureter.  He was seen by Dr. Prescott Gum of cardiothoracic surgery who felt he is not a surgical candidate.  He did advise reversing Eliquis and hold it given possible leak.  It was recommended for the patient to proceed with hospice given high mortality without surgery.  During the same hospitalization, he was treated for acute on chronic diastolic heart failure with IV Lasix.  Creatinine was 2.13 prior to discharge.  He was discharged on previous home dose of 80 mg daily of torsemide.  Repeat echocardiogram obtained on the day of discharge on 07/14/2019 showed EF 55 to 60%, mild LVH, mild to moderate AI, mild dilatation of the ascending aorta measuring at 40 mm.  He was discharged with indwelling Foley with instruction to follow-up with the urology as outpatient.  I called [employer] today and spoke to the patient's nurse Mrs. Earleen Newport, RN.  Since discharge, patient continued to be fairly weak however able to participate in activities he enjoys.  He is wheelchair-bound at this point.  He did have some nausea initially  after returning to the country [facility] in July, this has really resolved.  I have confirmed with them that he is no longer on any Eliquis or aspirin.  He denies any recent chest pain or significant shortness of breath.  His nurse also did not observe any significant lower extremity edema either.  Despite elevated heart rate on the last visit, according to his nurse, recent  heart rate has been in the 70s, there was only one recording where his heart rate was in the 90s.  Patient seems to be fairly stable from cardiac perspective.  He is hospice at this point.  I plan to set him up for virtual visit with Dr. [name] in 6 months.  The patient does not have symptoms concerning for  COVID-19 infection (fever, chills, cough, or new shortness of breath).    Past Medical History:  Diagnosis Date  . Anginal pain ([place])   . Arthritis    "all over"  . Cancer ([place])   . CHF (congestive heart failure) ([place])   . Coronary artery disease   . Dementia ([place])   . Depression ~ 59; ~ 1990   PTSD/[place]   . GERD (gastroesophageal reflux disease)   . High cholesterol   . Hypertension   . Hyperthyroidism   . Neuropathy   . Osteoporosis    Past Surgical History:  Procedure Laterality Date  . BACK SURGERY    . CARDIAC CATHETERIZATION  09/2007  . CARDIAC CATHETERIZATION N/A 05/08/2014   Procedure: CORONARY STENT INTERVENTION;  Surgeon: [name], MD; 3.518 mm Xience Alpine DES to the RCA   . LEFT HEART CATHETERIZATION WITH CORONARY ANGIOGRAM N/A 05/08/2014   Procedure: LEFT HEART CATHETERIZATION WITH CORONARY ANGIOGRAM;  Surgeon: [name], MD; LAD 100% (old), CFX 20%, OM1 30%, pRCA 30%, dRCA 95%, EF 55%     . LUMBAR LAMINECTOMY/DECOMPRESSION MICRODISCECTOMY  2011     Current Meds  Medication Sig  . acetaminophen (TYLENOL) 500 MG tablet Take 500-1,000 mg by mouth every 6 (six) hours as needed for mild pain.   [name] amLODipine (NORVASC) 10 MG tablet Take 10 mg by mouth daily.  . carbamide peroxide (DEBROX) 6.5 % OTIC solution 2 times a day for 3 days then ear(s) flushed on 4th day  . Carboxymethylcellul-Glycerin (REFRESH OPTIVE) 1-0.9 % GEL Apply 1 drop to eye 4 (four) times daily.  . cyanocobalamin 1000 MCG tablet Take 1,000 mcg by mouth daily.  Mariane Baumgarten Sodium 100 MG capsule Take 100 mg by mouth daily.  . Ferrous Sulfate (IRON) 325 (65 Fe) MG TABS Take 325 mg by  mouth daily.  . finasteride (PROSCAR) 5 MG tablet Take 5 mg by mouth daily.  [name] HYDROcodone-acetaminophen (NORCO/VICODIN) 5-325 MG tablet Take 1 tablet by mouth every 6 (six) hours as needed for moderate pain.  [name] levalbuterol (XOPENEX) 1.25 MG/0.5ML nebulizer solution Take 1.25 mg by nebulization 3 (three) times daily.  [name] loratadine (CLARITIN) 10 MG tablet Take 1 tablet (10 mg total) by mouth daily.  . metoprolol (LOPRESSOR) 100 MG tablet Take 100 mg by mouth 2 (two) times daily.  . Multiple Vitamins-Minerals (PRESERVISION AREDS 2+MULTI VIT) CAPS Take 1 capsule by mouth 2 (two) times daily.   [name] nystatin (NYSTATIN) powder Apply 1 application topically as needed (groin andn buttocks).  [name] omeprazole (PRILOSEC) 20 MG capsule Take 20 mg by mouth at bedtime.   . ondansetron (ZOFRAN) 4 MG tablet Take by mouth.  . polyethylene glycol (MIRALAX / GLYCOLAX) 17 g packet Take  17 g by mouth daily as needed for mild constipation.  . Saccharomyces boulardii (PROBIOTIC) 250 MG CAPS Take 250 mg by mouth. 1 tablet daily for 30 days  . sertraline (ZOLOFT) 50 MG tablet Take 25 mg by mouth daily.   . Skin Protectants, Misc. (EUCERIN) cream Apply 1 application topically 2 (two) times daily. Apply to back, upper back, and behind left ear, and upper right chest.  . tamsulosin (FLOMAX) 0.4 MG CAPS capsule Take 1 capsule (0.4 mg total) by mouth daily after supper.  . tiotropium (SPIRIVA HANDIHALER) 18 MCG inhalation capsule Place 1 capsule (18 mcg total) into inhaler and inhale daily.  [name] torsemide (DEMADEX) 20 MG tablet Take 80 mg by mouth daily.  . traZODone (DESYREL) 150 MG tablet Take 0.5 tablets (75 mg total) by mouth at bedtime.     Allergies:   Cortisone   Social History   Tobacco Use  . Smoking status: Former Smoker    Packs/day: 1.00    Years: 20.00    Pack years: 20.00    Types: Cigarettes    Quit date: 01/12/1962    Years since quitting: 57.7  . Smokeless tobacco: Never Used  Substance Use Topics  .  Alcohol use: No    Alcohol/week: 0.0 standard drinks    Comment: 05/08/2014 "he's been in [place] for ~!35 yrs"  . Drug use: No     Family Hx: The patient's family history includes Asthma in his mother; CAD in his mother.  ROS:   Please see the history of present illness.     All other systems reviewed and are negative.   Prior CV studies:   The following studies were reviewed today:  CT abdomen and pelvis 07/12/2019 IMPRESSION: 1. Leaking 7.2 x 6.5 cm aortic aneurysm at the level of the diaphragmatic hiatus. Surgical consultation recommended. 2. Bladder outlet obstruction, with bilateral hydronephrosis and hydroureter as above. 3. Nonobstructing 5 mm distal right ureteral calculus. 4. 3.9 cm infrarenal abdominal aortic aneurysm without evidence of Complication.  Echo 07/14/2019 1. Left ventricular ejection fraction, by estimation, is 55 to 60%. The  left ventricle has normal function. Left ventricular endocardial border  not optimally defined to evaluate regional wall motion. There is mild left  ventricular hypertrophy. Left  ventricular diastolic parameters are indeterminate.  2. Right ventricular systolic function is normal. The right ventricular  size is mildly enlarged. Tricuspid regurgitation signal is inadequate for  assessing PA pressure.  3. The mitral valve is normal in structure. Trivial mitral valve  regurgitation. No evidence of mitral stenosis.  4. The aortic valve is grossly normal. Aortic valve regurgitation is mild  to moderate. No aortic stenosis is present.  5. Aortic dilatation noted. There is mild dilatation of the ascending  aorta measuring 40 mm.  6. The inferior vena cava is normal in size with greater than 50%  respiratory variability, suggesting right atrial pressure of 3 mmHg.   Labs/Other Tests and Data Reviewed:    EKG:  An ECG dated 07/11/2019 was personally reviewed today and demonstrated:  Atrial rhythm with frequent PACs.  Recent  Labs: 07/11/2019: B Natriuretic Peptide 370.6; Hemoglobin 11.3; Platelets 268 07/12/2019: Magnesium 2.0 07/14/2019: BUN 42; Creatinine, Ser 2.13; Potassium 3.7; Sodium 140   Recent Lipid Panel Lab Results  Component Value Date/Time   CHOL 111 05/10/2014 10:15 AM   TRIG 60 05/10/2014 10:15 AM   HDL 30 (L) 05/10/2014 10:15 AM   CHOLHDL 3.7 05/10/2014 10:15 AM   LDLCALC 69 05/10/2014 10:15  AM    Wt Readings from Last 3 Encounters:  10/13/19 167 lb 9.6 oz (76 kg)  07/11/19 199 lb (90.3 kg)  07/11/19 199 lb (90.3 kg)     Objective:    Vital Signs:  BP 122/62   Pulse 70   Wt 167 lb 9.6 oz (76 kg)   BMI 22.73 kg/m    VITAL SIGNS:  reviewed  ASSESSMENT & PLAN:    1. CAD: Denies any recent chest pain.  2. Chronic diastolic heart failure: Euvolemic on current therapy  3. PAF: Eliquis was removed recently due to leaking abdominal aortic aneurysm that is not amenable to surgery.  Heart rate well controlled at the nursing home  4. COPD: No acute exacerbation.  5. Abdominal aortic aneurysm: Recent image showed leaking abdominal aortic aneurysm measuring up to 7.2 cm.  He was turned down for surgery.  Eliquis was removed due to concern of increased bleeding.  Very poor long-term prognosis.  Currently in hospice  6. CKD stage IV: Creatinine around 2.1.  7. Hospice: According to the patient's nurse, patient is currently in hospice  COVID-19 Education: The signs and symptoms of COVID-19 were discussed with the patient and how to seek care for testing (follow up with PCP or arrange E-visit).  The importance of social distancing was discussed today.  Time:   Today, I have spent 8 minutes with the patient with telehealth technology discussing the above problems.     Medication Adjustments/Labs and Tests Ordered: Current medicines are reviewed at length with the patient today.  Concerns regarding medicines are outlined above.   Tests Ordered: No orders of the defined types were placed  in this encounter.   Medication Changes: No orders of the defined types were placed in this encounter.   Follow Up:  In Person in 6 month(s)  Signed, Almyra Deforest, [place]  10/15/2019 9:41 PM    [facility]

## 2019-10-15 DIAGNOSIS — I5030 Unspecified diastolic (congestive) heart failure: Secondary | ICD-10-CM | POA: Diagnosis not present

## 2019-10-15 DIAGNOSIS — I4891 Unspecified atrial fibrillation: Secondary | ICD-10-CM | POA: Diagnosis not present

## 2019-10-15 DIAGNOSIS — I711 Thoracic aortic aneurysm, ruptured: Secondary | ICD-10-CM | POA: Diagnosis not present

## 2019-10-15 DIAGNOSIS — I130 Hypertensive heart and chronic kidney disease with heart failure and stage 1 through stage 4 chronic kidney disease, or unspecified chronic kidney disease: Secondary | ICD-10-CM | POA: Diagnosis not present

## 2019-10-15 DIAGNOSIS — I2510 Atherosclerotic heart disease of native coronary artery without angina pectoris: Secondary | ICD-10-CM | POA: Diagnosis not present

## 2019-10-15 DIAGNOSIS — J449 Chronic obstructive pulmonary disease, unspecified: Secondary | ICD-10-CM | POA: Diagnosis not present

## 2019-10-16 DIAGNOSIS — R1310 Dysphagia, unspecified: Secondary | ICD-10-CM | POA: Diagnosis not present

## 2019-10-16 DIAGNOSIS — I480 Paroxysmal atrial fibrillation: Secondary | ICD-10-CM | POA: Diagnosis not present

## 2019-10-16 DIAGNOSIS — I716 Thoracoabdominal aortic aneurysm, without rupture: Secondary | ICD-10-CM | POA: Diagnosis not present

## 2019-10-16 DIAGNOSIS — I5032 Chronic diastolic (congestive) heart failure: Secondary | ICD-10-CM | POA: Diagnosis not present

## 2019-10-18 DIAGNOSIS — J449 Chronic obstructive pulmonary disease, unspecified: Secondary | ICD-10-CM | POA: Diagnosis not present

## 2019-10-18 DIAGNOSIS — I2510 Atherosclerotic heart disease of native coronary artery without angina pectoris: Secondary | ICD-10-CM | POA: Diagnosis not present

## 2019-10-18 DIAGNOSIS — I5030 Unspecified diastolic (congestive) heart failure: Secondary | ICD-10-CM | POA: Diagnosis not present

## 2019-10-18 DIAGNOSIS — I130 Hypertensive heart and chronic kidney disease with heart failure and stage 1 through stage 4 chronic kidney disease, or unspecified chronic kidney disease: Secondary | ICD-10-CM | POA: Diagnosis not present

## 2019-10-18 DIAGNOSIS — I711 Thoracic aortic aneurysm, ruptured: Secondary | ICD-10-CM | POA: Diagnosis not present

## 2019-10-18 DIAGNOSIS — I4891 Unspecified atrial fibrillation: Secondary | ICD-10-CM | POA: Diagnosis not present

## 2019-10-19 DIAGNOSIS — I480 Paroxysmal atrial fibrillation: Secondary | ICD-10-CM | POA: Diagnosis not present

## 2019-10-19 DIAGNOSIS — I5032 Chronic diastolic (congestive) heart failure: Secondary | ICD-10-CM | POA: Diagnosis not present

## 2019-10-19 DIAGNOSIS — I2510 Atherosclerotic heart disease of native coronary artery without angina pectoris: Secondary | ICD-10-CM | POA: Diagnosis not present

## 2019-10-19 DIAGNOSIS — R1310 Dysphagia, unspecified: Secondary | ICD-10-CM | POA: Diagnosis not present

## 2019-10-25 DIAGNOSIS — I4891 Unspecified atrial fibrillation: Secondary | ICD-10-CM | POA: Diagnosis not present

## 2019-10-25 DIAGNOSIS — I711 Thoracic aortic aneurysm, ruptured: Secondary | ICD-10-CM | POA: Diagnosis not present

## 2019-10-25 DIAGNOSIS — I5030 Unspecified diastolic (congestive) heart failure: Secondary | ICD-10-CM | POA: Diagnosis not present

## 2019-10-25 DIAGNOSIS — J449 Chronic obstructive pulmonary disease, unspecified: Secondary | ICD-10-CM | POA: Diagnosis not present

## 2019-10-25 DIAGNOSIS — I130 Hypertensive heart and chronic kidney disease with heart failure and stage 1 through stage 4 chronic kidney disease, or unspecified chronic kidney disease: Secondary | ICD-10-CM | POA: Diagnosis not present

## 2019-10-25 DIAGNOSIS — I2510 Atherosclerotic heart disease of native coronary artery without angina pectoris: Secondary | ICD-10-CM | POA: Diagnosis not present

## 2019-10-31 DIAGNOSIS — I509 Heart failure, unspecified: Secondary | ICD-10-CM | POA: Diagnosis not present

## 2019-10-31 DIAGNOSIS — I5030 Unspecified diastolic (congestive) heart failure: Secondary | ICD-10-CM | POA: Diagnosis not present

## 2019-10-31 DIAGNOSIS — F329 Major depressive disorder, single episode, unspecified: Secondary | ICD-10-CM | POA: Diagnosis not present

## 2019-10-31 DIAGNOSIS — N400 Enlarged prostate without lower urinary tract symptoms: Secondary | ICD-10-CM | POA: Diagnosis not present

## 2019-10-31 DIAGNOSIS — I5032 Chronic diastolic (congestive) heart failure: Secondary | ICD-10-CM | POA: Diagnosis not present

## 2019-10-31 DIAGNOSIS — E559 Vitamin D deficiency, unspecified: Secondary | ICD-10-CM | POA: Diagnosis not present

## 2019-10-31 DIAGNOSIS — E782 Mixed hyperlipidemia: Secondary | ICD-10-CM | POA: Diagnosis not present

## 2019-10-31 DIAGNOSIS — F0390 Unspecified dementia without behavioral disturbance: Secondary | ICD-10-CM | POA: Diagnosis not present

## 2019-10-31 DIAGNOSIS — I2510 Atherosclerotic heart disease of native coronary artery without angina pectoris: Secondary | ICD-10-CM | POA: Diagnosis not present

## 2019-10-31 DIAGNOSIS — I480 Paroxysmal atrial fibrillation: Secondary | ICD-10-CM | POA: Diagnosis not present

## 2019-10-31 DIAGNOSIS — I10 Essential (primary) hypertension: Secondary | ICD-10-CM | POA: Diagnosis not present

## 2019-10-31 DIAGNOSIS — J449 Chronic obstructive pulmonary disease, unspecified: Secondary | ICD-10-CM | POA: Diagnosis not present

## 2019-11-01 DIAGNOSIS — I4891 Unspecified atrial fibrillation: Secondary | ICD-10-CM | POA: Diagnosis not present

## 2019-11-01 DIAGNOSIS — I711 Thoracic aortic aneurysm, ruptured: Secondary | ICD-10-CM | POA: Diagnosis not present

## 2019-11-01 DIAGNOSIS — R2681 Unsteadiness on feet: Secondary | ICD-10-CM | POA: Diagnosis not present

## 2019-11-01 DIAGNOSIS — I2510 Atherosclerotic heart disease of native coronary artery without angina pectoris: Secondary | ICD-10-CM | POA: Diagnosis not present

## 2019-11-01 DIAGNOSIS — J449 Chronic obstructive pulmonary disease, unspecified: Secondary | ICD-10-CM | POA: Diagnosis not present

## 2019-11-01 DIAGNOSIS — I5030 Unspecified diastolic (congestive) heart failure: Secondary | ICD-10-CM | POA: Diagnosis not present

## 2019-11-01 DIAGNOSIS — I130 Hypertensive heart and chronic kidney disease with heart failure and stage 1 through stage 4 chronic kidney disease, or unspecified chronic kidney disease: Secondary | ICD-10-CM | POA: Diagnosis not present

## 2019-11-02 DIAGNOSIS — J449 Chronic obstructive pulmonary disease, unspecified: Secondary | ICD-10-CM | POA: Diagnosis not present

## 2019-11-02 DIAGNOSIS — I4891 Unspecified atrial fibrillation: Secondary | ICD-10-CM | POA: Diagnosis not present

## 2019-11-02 DIAGNOSIS — I711 Thoracic aortic aneurysm, ruptured: Secondary | ICD-10-CM | POA: Diagnosis not present

## 2019-11-02 DIAGNOSIS — I130 Hypertensive heart and chronic kidney disease with heart failure and stage 1 through stage 4 chronic kidney disease, or unspecified chronic kidney disease: Secondary | ICD-10-CM | POA: Diagnosis not present

## 2019-11-02 DIAGNOSIS — I2510 Atherosclerotic heart disease of native coronary artery without angina pectoris: Secondary | ICD-10-CM | POA: Diagnosis not present

## 2019-11-02 DIAGNOSIS — I5030 Unspecified diastolic (congestive) heart failure: Secondary | ICD-10-CM | POA: Diagnosis not present

## 2019-11-10 DIAGNOSIS — J449 Chronic obstructive pulmonary disease, unspecified: Secondary | ICD-10-CM | POA: Diagnosis not present

## 2019-11-10 DIAGNOSIS — I2510 Atherosclerotic heart disease of native coronary artery without angina pectoris: Secondary | ICD-10-CM | POA: Diagnosis not present

## 2019-11-10 DIAGNOSIS — I130 Hypertensive heart and chronic kidney disease with heart failure and stage 1 through stage 4 chronic kidney disease, or unspecified chronic kidney disease: Secondary | ICD-10-CM | POA: Diagnosis not present

## 2019-11-10 DIAGNOSIS — I711 Thoracic aortic aneurysm, ruptured: Secondary | ICD-10-CM | POA: Diagnosis not present

## 2019-11-10 DIAGNOSIS — I4891 Unspecified atrial fibrillation: Secondary | ICD-10-CM | POA: Diagnosis not present

## 2019-11-10 DIAGNOSIS — I5030 Unspecified diastolic (congestive) heart failure: Secondary | ICD-10-CM | POA: Diagnosis not present

## 2019-11-16 DIAGNOSIS — I2510 Atherosclerotic heart disease of native coronary artery without angina pectoris: Secondary | ICD-10-CM | POA: Diagnosis not present

## 2019-11-16 DIAGNOSIS — R5381 Other malaise: Secondary | ICD-10-CM | POA: Diagnosis not present

## 2019-11-16 DIAGNOSIS — K219 Gastro-esophageal reflux disease without esophagitis: Secondary | ICD-10-CM | POA: Diagnosis not present

## 2019-11-16 DIAGNOSIS — I480 Paroxysmal atrial fibrillation: Secondary | ICD-10-CM | POA: Diagnosis not present

## 2019-11-20 DIAGNOSIS — I480 Paroxysmal atrial fibrillation: Secondary | ICD-10-CM | POA: Diagnosis not present

## 2019-11-20 DIAGNOSIS — I5032 Chronic diastolic (congestive) heart failure: Secondary | ICD-10-CM | POA: Diagnosis not present

## 2019-11-20 DIAGNOSIS — J449 Chronic obstructive pulmonary disease, unspecified: Secondary | ICD-10-CM | POA: Diagnosis not present

## 2019-11-20 DIAGNOSIS — R1310 Dysphagia, unspecified: Secondary | ICD-10-CM | POA: Diagnosis not present

## 2019-11-20 DIAGNOSIS — E782 Mixed hyperlipidemia: Secondary | ICD-10-CM | POA: Diagnosis not present

## 2019-11-20 DIAGNOSIS — I509 Heart failure, unspecified: Secondary | ICD-10-CM | POA: Diagnosis not present

## 2019-11-20 DIAGNOSIS — N184 Chronic kidney disease, stage 4 (severe): Secondary | ICD-10-CM | POA: Diagnosis not present

## 2019-11-20 DIAGNOSIS — I5030 Unspecified diastolic (congestive) heart failure: Secondary | ICD-10-CM | POA: Diagnosis not present

## 2019-11-20 DIAGNOSIS — E559 Vitamin D deficiency, unspecified: Secondary | ICD-10-CM | POA: Diagnosis not present

## 2019-11-20 DIAGNOSIS — I2510 Atherosclerotic heart disease of native coronary artery without angina pectoris: Secondary | ICD-10-CM | POA: Diagnosis not present

## 2019-11-20 DIAGNOSIS — R410 Disorientation, unspecified: Secondary | ICD-10-CM | POA: Diagnosis not present

## 2019-11-20 DIAGNOSIS — F329 Major depressive disorder, single episode, unspecified: Secondary | ICD-10-CM | POA: Diagnosis not present

## 2019-11-20 DIAGNOSIS — F0390 Unspecified dementia without behavioral disturbance: Secondary | ICD-10-CM | POA: Diagnosis not present

## 2019-11-20 DIAGNOSIS — N400 Enlarged prostate without lower urinary tract symptoms: Secondary | ICD-10-CM | POA: Diagnosis not present

## 2019-11-20 DIAGNOSIS — G4700 Insomnia, unspecified: Secondary | ICD-10-CM | POA: Diagnosis not present

## 2019-11-20 DIAGNOSIS — I10 Essential (primary) hypertension: Secondary | ICD-10-CM | POA: Diagnosis not present

## 2019-11-22 DIAGNOSIS — G4700 Insomnia, unspecified: Secondary | ICD-10-CM | POA: Diagnosis not present

## 2019-11-22 DIAGNOSIS — I480 Paroxysmal atrial fibrillation: Secondary | ICD-10-CM | POA: Diagnosis not present

## 2019-11-22 DIAGNOSIS — N390 Urinary tract infection, site not specified: Secondary | ICD-10-CM | POA: Diagnosis not present

## 2019-11-22 DIAGNOSIS — I716 Thoracoabdominal aortic aneurysm, without rupture: Secondary | ICD-10-CM | POA: Diagnosis not present

## 2019-11-22 DIAGNOSIS — I5032 Chronic diastolic (congestive) heart failure: Secondary | ICD-10-CM | POA: Diagnosis not present

## 2019-11-22 DIAGNOSIS — N184 Chronic kidney disease, stage 4 (severe): Secondary | ICD-10-CM | POA: Diagnosis not present

## 2019-12-01 DIAGNOSIS — I150 Renovascular hypertension: Secondary | ICD-10-CM | POA: Diagnosis not present

## 2019-12-14 DIAGNOSIS — N184 Chronic kidney disease, stage 4 (severe): Secondary | ICD-10-CM | POA: Diagnosis not present

## 2019-12-14 DIAGNOSIS — N390 Urinary tract infection, site not specified: Secondary | ICD-10-CM | POA: Diagnosis not present

## 2019-12-14 DIAGNOSIS — I5032 Chronic diastolic (congestive) heart failure: Secondary | ICD-10-CM | POA: Diagnosis not present

## 2019-12-14 DIAGNOSIS — R1310 Dysphagia, unspecified: Secondary | ICD-10-CM | POA: Diagnosis not present

## 2019-12-15 DIAGNOSIS — D485 Neoplasm of uncertain behavior of skin: Secondary | ICD-10-CM | POA: Diagnosis not present

## 2019-12-15 DIAGNOSIS — C44319 Basal cell carcinoma of skin of other parts of face: Secondary | ICD-10-CM | POA: Diagnosis not present

## 2019-12-15 DIAGNOSIS — L570 Actinic keratosis: Secondary | ICD-10-CM | POA: Diagnosis not present

## 2019-12-15 DIAGNOSIS — L821 Other seborrheic keratosis: Secondary | ICD-10-CM | POA: Diagnosis not present

## 2019-12-15 DIAGNOSIS — D225 Melanocytic nevi of trunk: Secondary | ICD-10-CM | POA: Diagnosis not present

## 2019-12-15 DIAGNOSIS — C44311 Basal cell carcinoma of skin of nose: Secondary | ICD-10-CM | POA: Diagnosis not present

## 2019-12-15 DIAGNOSIS — Z85828 Personal history of other malignant neoplasm of skin: Secondary | ICD-10-CM | POA: Diagnosis not present

## 2019-12-15 DIAGNOSIS — D2262 Melanocytic nevi of left upper limb, including shoulder: Secondary | ICD-10-CM | POA: Diagnosis not present

## 2019-12-19 DIAGNOSIS — I480 Paroxysmal atrial fibrillation: Secondary | ICD-10-CM | POA: Diagnosis not present

## 2019-12-19 DIAGNOSIS — E782 Mixed hyperlipidemia: Secondary | ICD-10-CM | POA: Diagnosis not present

## 2019-12-19 DIAGNOSIS — I10 Essential (primary) hypertension: Secondary | ICD-10-CM | POA: Diagnosis not present

## 2019-12-19 DIAGNOSIS — F0390 Unspecified dementia without behavioral disturbance: Secondary | ICD-10-CM | POA: Diagnosis not present

## 2019-12-19 DIAGNOSIS — E559 Vitamin D deficiency, unspecified: Secondary | ICD-10-CM | POA: Diagnosis not present

## 2019-12-19 DIAGNOSIS — N400 Enlarged prostate without lower urinary tract symptoms: Secondary | ICD-10-CM | POA: Diagnosis not present

## 2019-12-19 DIAGNOSIS — I509 Heart failure, unspecified: Secondary | ICD-10-CM | POA: Diagnosis not present

## 2019-12-19 DIAGNOSIS — F329 Major depressive disorder, single episode, unspecified: Secondary | ICD-10-CM | POA: Diagnosis not present

## 2019-12-19 DIAGNOSIS — I5032 Chronic diastolic (congestive) heart failure: Secondary | ICD-10-CM | POA: Diagnosis not present

## 2019-12-19 DIAGNOSIS — I5030 Unspecified diastolic (congestive) heart failure: Secondary | ICD-10-CM | POA: Diagnosis not present

## 2019-12-19 DIAGNOSIS — J449 Chronic obstructive pulmonary disease, unspecified: Secondary | ICD-10-CM | POA: Diagnosis not present

## 2019-12-19 DIAGNOSIS — I2510 Atherosclerotic heart disease of native coronary artery without angina pectoris: Secondary | ICD-10-CM | POA: Diagnosis not present

## 2019-12-25 DIAGNOSIS — I5032 Chronic diastolic (congestive) heart failure: Secondary | ICD-10-CM | POA: Diagnosis not present

## 2019-12-25 DIAGNOSIS — R1310 Dysphagia, unspecified: Secondary | ICD-10-CM | POA: Diagnosis not present

## 2019-12-25 DIAGNOSIS — N184 Chronic kidney disease, stage 4 (severe): Secondary | ICD-10-CM | POA: Diagnosis not present

## 2019-12-25 DIAGNOSIS — K219 Gastro-esophageal reflux disease without esophagitis: Secondary | ICD-10-CM | POA: Diagnosis not present

## 2019-12-25 DIAGNOSIS — K5900 Constipation, unspecified: Secondary | ICD-10-CM | POA: Diagnosis not present

## 2019-12-25 DIAGNOSIS — N400 Enlarged prostate without lower urinary tract symptoms: Secondary | ICD-10-CM | POA: Diagnosis not present

## 2019-12-25 DIAGNOSIS — G4700 Insomnia, unspecified: Secondary | ICD-10-CM | POA: Diagnosis not present

## 2020-01-11 DIAGNOSIS — I2510 Atherosclerotic heart disease of native coronary artery without angina pectoris: Secondary | ICD-10-CM | POA: Diagnosis not present

## 2020-01-11 DIAGNOSIS — I5030 Unspecified diastolic (congestive) heart failure: Secondary | ICD-10-CM | POA: Diagnosis not present

## 2020-01-11 DIAGNOSIS — I10 Essential (primary) hypertension: Secondary | ICD-10-CM | POA: Diagnosis not present

## 2020-01-11 DIAGNOSIS — J449 Chronic obstructive pulmonary disease, unspecified: Secondary | ICD-10-CM | POA: Diagnosis not present

## 2020-04-08 ENCOUNTER — Telehealth: Payer: Self-pay | Admitting: Cardiovascular Disease

## 2020-04-08 NOTE — Telephone Encounter (Signed)
  Per answering service message:  [name] [name] from [facility] states patient is gaining weight rapidly, and they are very concerned since he has reached 209lbs. He was 204lbs yesterday.

## 2020-04-08 NOTE — Telephone Encounter (Signed)
Lm for [name] [name] to call our office back.

## 2020-04-16 NOTE — Telephone Encounter (Signed)
Received a call from [place] at [facility] returning call.She stated patient is doing well this morning.Stated current weight 205  lbs.Stated she does not know complete history on why they called.Stated she will have [place] call back if she has any concerns.

## 2020-04-16 NOTE — Telephone Encounter (Signed)
Left message at [facility] for nurse to call back

## 2020-12-12 DEATH — deceased
